# Patient Record
Sex: Female | Born: 1945 | Race: White | Hispanic: No | Marital: Married | Smoking: Current every day smoker
Health system: Southern US, Community
[De-identification: ages and names within clinical notes are randomized; demographics above are authoritative.]

## PROBLEM LIST (undated history)

## (undated) DIAGNOSIS — I10 Essential (primary) hypertension: Secondary | ICD-10-CM

## (undated) DIAGNOSIS — F32A Depression, unspecified: Secondary | ICD-10-CM

## (undated) DIAGNOSIS — E039 Hypothyroidism, unspecified: Secondary | ICD-10-CM

## (undated) DIAGNOSIS — J449 Chronic obstructive pulmonary disease, unspecified: Secondary | ICD-10-CM

## (undated) DIAGNOSIS — M1990 Unspecified osteoarthritis, unspecified site: Secondary | ICD-10-CM

## (undated) DIAGNOSIS — M8580 Other specified disorders of bone density and structure, unspecified site: Secondary | ICD-10-CM

## (undated) DIAGNOSIS — F329 Major depressive disorder, single episode, unspecified: Secondary | ICD-10-CM

## (undated) DIAGNOSIS — T4145XA Adverse effect of unspecified anesthetic, initial encounter: Secondary | ICD-10-CM

## (undated) DIAGNOSIS — Z8719 Personal history of other diseases of the digestive system: Secondary | ICD-10-CM

## (undated) DIAGNOSIS — T8859XA Other complications of anesthesia, initial encounter: Secondary | ICD-10-CM

## (undated) DIAGNOSIS — T7840XA Allergy, unspecified, initial encounter: Secondary | ICD-10-CM

## (undated) DIAGNOSIS — K219 Gastro-esophageal reflux disease without esophagitis: Secondary | ICD-10-CM

## (undated) DIAGNOSIS — F419 Anxiety disorder, unspecified: Secondary | ICD-10-CM

## (undated) DIAGNOSIS — G43909 Migraine, unspecified, not intractable, without status migrainosus: Secondary | ICD-10-CM

## (undated) DIAGNOSIS — I4891 Unspecified atrial fibrillation: Secondary | ICD-10-CM

## (undated) HISTORY — DX: Essential (primary) hypertension: I10

## (undated) HISTORY — PX: CERVICAL FUSION: SHX112

## (undated) HISTORY — DX: Unspecified atrial fibrillation: I48.91

## (undated) HISTORY — DX: Other specified disorders of bone density and structure, unspecified site: M85.80

## (undated) HISTORY — PX: TONSILLECTOMY: SUR1361

## (undated) HISTORY — DX: Migraine, unspecified, not intractable, without status migrainosus: G43.909

## (undated) HISTORY — PX: CHOLECYSTECTOMY: SHX55

## (undated) HISTORY — PX: THYROIDECTOMY: SHX17

## (undated) HISTORY — DX: Anxiety disorder, unspecified: F41.9

## (undated) HISTORY — PX: VAGINAL HYSTERECTOMY: SUR661

## (undated) HISTORY — PX: OTHER SURGICAL HISTORY: SHX169

## (undated) HISTORY — DX: Major depressive disorder, single episode, unspecified: F32.9

## (undated) HISTORY — PX: CATARACT EXTRACTION, BILATERAL: SHX1313

## (undated) HISTORY — DX: Allergy, unspecified, initial encounter: T78.40XA

## (undated) HISTORY — DX: Depression, unspecified: F32.A

## (undated) HISTORY — DX: Chronic obstructive pulmonary disease, unspecified: J44.9

---

## 1977-03-01 HISTORY — PX: BREAST EXCISIONAL BIOPSY: SUR124

## 1998-02-11 ENCOUNTER — Other Ambulatory Visit: Admission: RE | Admit: 1998-02-11 | Discharge: 1998-02-11 | Payer: Self-pay | Admitting: Family Medicine

## 1999-02-10 ENCOUNTER — Other Ambulatory Visit: Admission: RE | Admit: 1999-02-10 | Discharge: 1999-02-10 | Payer: Self-pay | Admitting: Family Medicine

## 1999-07-16 ENCOUNTER — Encounter: Admission: RE | Admit: 1999-07-16 | Discharge: 1999-07-16 | Payer: Self-pay | Admitting: Family Medicine

## 1999-07-16 ENCOUNTER — Encounter: Payer: Self-pay | Admitting: Family Medicine

## 1999-09-14 ENCOUNTER — Encounter: Admission: RE | Admit: 1999-09-14 | Discharge: 1999-09-14 | Payer: Self-pay | Admitting: Obstetrics and Gynecology

## 1999-09-14 ENCOUNTER — Encounter: Payer: Self-pay | Admitting: Obstetrics and Gynecology

## 1999-09-27 ENCOUNTER — Emergency Department (HOSPITAL_COMMUNITY): Admission: EM | Admit: 1999-09-27 | Discharge: 1999-09-28 | Payer: Self-pay | Admitting: Emergency Medicine

## 1999-09-27 ENCOUNTER — Encounter: Payer: Self-pay | Admitting: Emergency Medicine

## 1999-10-02 ENCOUNTER — Encounter: Payer: Self-pay | Admitting: Family Medicine

## 1999-10-02 ENCOUNTER — Encounter: Payer: Self-pay | Admitting: Emergency Medicine

## 1999-10-02 ENCOUNTER — Inpatient Hospital Stay (HOSPITAL_COMMUNITY): Admission: EM | Admit: 1999-10-02 | Discharge: 1999-10-06 | Payer: Self-pay | Admitting: Emergency Medicine

## 1999-11-17 ENCOUNTER — Encounter: Admission: RE | Admit: 1999-11-17 | Discharge: 1999-11-17 | Payer: Self-pay | Admitting: Internal Medicine

## 1999-11-17 ENCOUNTER — Encounter: Admission: RE | Admit: 1999-11-17 | Discharge: 1999-11-17 | Payer: Self-pay | Admitting: Family Medicine

## 1999-11-17 ENCOUNTER — Encounter: Payer: Self-pay | Admitting: Family Medicine

## 2000-02-12 ENCOUNTER — Other Ambulatory Visit: Admission: RE | Admit: 2000-02-12 | Discharge: 2000-02-12 | Payer: Self-pay | Admitting: Family Medicine

## 2000-03-14 ENCOUNTER — Ambulatory Visit (HOSPITAL_COMMUNITY): Admission: RE | Admit: 2000-03-14 | Discharge: 2000-03-14 | Payer: Self-pay | Admitting: Family Medicine

## 2000-03-31 ENCOUNTER — Encounter: Admission: RE | Admit: 2000-03-31 | Discharge: 2000-03-31 | Payer: Self-pay | Admitting: Orthopedic Surgery

## 2000-03-31 ENCOUNTER — Encounter: Payer: Self-pay | Admitting: Orthopedic Surgery

## 2000-09-23 ENCOUNTER — Encounter: Payer: Self-pay | Admitting: Family Medicine

## 2000-09-23 ENCOUNTER — Encounter: Admission: RE | Admit: 2000-09-23 | Discharge: 2000-09-23 | Payer: Self-pay | Admitting: Family Medicine

## 2001-02-14 ENCOUNTER — Other Ambulatory Visit: Admission: RE | Admit: 2001-02-14 | Discharge: 2001-02-14 | Payer: Self-pay | Admitting: Family Medicine

## 2001-09-27 ENCOUNTER — Encounter: Payer: Self-pay | Admitting: Family Medicine

## 2001-09-27 ENCOUNTER — Encounter: Admission: RE | Admit: 2001-09-27 | Discharge: 2001-09-27 | Payer: Self-pay | Admitting: Family Medicine

## 2002-02-15 ENCOUNTER — Other Ambulatory Visit: Admission: RE | Admit: 2002-02-15 | Discharge: 2002-02-15 | Payer: Self-pay | Admitting: Family Medicine

## 2002-10-01 ENCOUNTER — Encounter: Admission: RE | Admit: 2002-10-01 | Discharge: 2002-10-01 | Payer: Self-pay | Admitting: Family Medicine

## 2002-10-01 ENCOUNTER — Encounter: Payer: Self-pay | Admitting: Family Medicine

## 2003-02-18 ENCOUNTER — Other Ambulatory Visit: Admission: RE | Admit: 2003-02-18 | Discharge: 2003-02-18 | Payer: Self-pay | Admitting: Family Medicine

## 2003-02-19 ENCOUNTER — Encounter: Admission: RE | Admit: 2003-02-19 | Discharge: 2003-02-19 | Payer: Self-pay | Admitting: Family Medicine

## 2003-10-09 ENCOUNTER — Ambulatory Visit (HOSPITAL_COMMUNITY): Admission: RE | Admit: 2003-10-09 | Discharge: 2003-10-09 | Payer: Self-pay | Admitting: Family Medicine

## 2003-10-14 ENCOUNTER — Encounter: Admission: RE | Admit: 2003-10-14 | Discharge: 2003-10-14 | Payer: Self-pay | Admitting: Family Medicine

## 2003-12-03 ENCOUNTER — Ambulatory Visit (HOSPITAL_COMMUNITY): Admission: RE | Admit: 2003-12-03 | Discharge: 2003-12-03 | Payer: Self-pay | Admitting: Gastroenterology

## 2003-12-03 LAB — HM COLONOSCOPY

## 2004-02-17 ENCOUNTER — Encounter: Payer: Self-pay | Admitting: Neurological Surgery

## 2004-02-17 ENCOUNTER — Observation Stay (HOSPITAL_COMMUNITY): Admission: RE | Admit: 2004-02-17 | Discharge: 2004-02-18 | Payer: Self-pay | Admitting: Neurological Surgery

## 2004-04-22 ENCOUNTER — Other Ambulatory Visit: Admission: RE | Admit: 2004-04-22 | Discharge: 2004-04-22 | Payer: Self-pay | Admitting: Family Medicine

## 2004-11-11 ENCOUNTER — Ambulatory Visit (HOSPITAL_COMMUNITY): Admission: RE | Admit: 2004-11-11 | Discharge: 2004-11-11 | Payer: Self-pay | Admitting: Family Medicine

## 2005-04-23 ENCOUNTER — Other Ambulatory Visit: Admission: RE | Admit: 2005-04-23 | Discharge: 2005-04-23 | Payer: Self-pay | Admitting: Family Medicine

## 2005-11-12 ENCOUNTER — Ambulatory Visit (HOSPITAL_COMMUNITY): Admission: RE | Admit: 2005-11-12 | Discharge: 2005-11-12 | Payer: Self-pay | Admitting: Obstetrics and Gynecology

## 2006-05-31 ENCOUNTER — Ambulatory Visit (HOSPITAL_BASED_OUTPATIENT_CLINIC_OR_DEPARTMENT_OTHER): Admission: RE | Admit: 2006-05-31 | Discharge: 2006-06-01 | Payer: Self-pay | Admitting: Orthopedic Surgery

## 2006-05-31 ENCOUNTER — Encounter (INDEPENDENT_AMBULATORY_CARE_PROVIDER_SITE_OTHER): Payer: Self-pay | Admitting: *Deleted

## 2006-11-15 ENCOUNTER — Ambulatory Visit (HOSPITAL_COMMUNITY): Admission: RE | Admit: 2006-11-15 | Discharge: 2006-11-15 | Payer: Self-pay | Admitting: Family Medicine

## 2007-11-16 ENCOUNTER — Ambulatory Visit (HOSPITAL_COMMUNITY): Admission: RE | Admit: 2007-11-16 | Discharge: 2007-11-16 | Payer: Self-pay | Admitting: Family Medicine

## 2008-02-27 ENCOUNTER — Encounter: Payer: Self-pay | Admitting: Obstetrics and Gynecology

## 2008-02-27 ENCOUNTER — Other Ambulatory Visit: Admission: RE | Admit: 2008-02-27 | Discharge: 2008-02-27 | Payer: Self-pay | Admitting: Obstetrics and Gynecology

## 2008-02-27 ENCOUNTER — Ambulatory Visit: Payer: Self-pay | Admitting: Obstetrics and Gynecology

## 2008-04-23 ENCOUNTER — Encounter: Admission: RE | Admit: 2008-04-23 | Discharge: 2008-04-23 | Payer: Self-pay | Admitting: Internal Medicine

## 2008-04-23 ENCOUNTER — Ambulatory Visit: Payer: Self-pay | Admitting: Internal Medicine

## 2008-05-13 ENCOUNTER — Ambulatory Visit: Payer: Self-pay | Admitting: Internal Medicine

## 2008-07-15 ENCOUNTER — Ambulatory Visit: Payer: Self-pay | Admitting: Internal Medicine

## 2008-08-16 ENCOUNTER — Ambulatory Visit: Payer: Self-pay | Admitting: Internal Medicine

## 2008-11-21 ENCOUNTER — Ambulatory Visit: Payer: Self-pay | Admitting: Internal Medicine

## 2008-12-16 ENCOUNTER — Ambulatory Visit (HOSPITAL_COMMUNITY): Admission: RE | Admit: 2008-12-16 | Discharge: 2008-12-16 | Payer: Self-pay | Admitting: Obstetrics and Gynecology

## 2009-05-02 ENCOUNTER — Ambulatory Visit: Payer: Self-pay | Admitting: Obstetrics and Gynecology

## 2009-05-02 ENCOUNTER — Other Ambulatory Visit: Admission: RE | Admit: 2009-05-02 | Discharge: 2009-05-02 | Payer: Self-pay | Admitting: Obstetrics and Gynecology

## 2009-06-13 ENCOUNTER — Ambulatory Visit: Payer: Self-pay | Admitting: Internal Medicine

## 2009-08-18 ENCOUNTER — Ambulatory Visit: Payer: Self-pay | Admitting: Internal Medicine

## 2009-12-16 ENCOUNTER — Ambulatory Visit: Payer: Self-pay | Admitting: Internal Medicine

## 2009-12-17 ENCOUNTER — Ambulatory Visit (HOSPITAL_COMMUNITY): Admission: RE | Admit: 2009-12-17 | Discharge: 2009-12-17 | Payer: Self-pay | Admitting: Obstetrics and Gynecology

## 2010-02-24 ENCOUNTER — Ambulatory Visit
Admission: RE | Admit: 2010-02-24 | Discharge: 2010-02-24 | Payer: Self-pay | Source: Home / Self Care | Attending: Internal Medicine | Admitting: Internal Medicine

## 2010-03-20 ENCOUNTER — Ambulatory Visit
Admission: RE | Admit: 2010-03-20 | Discharge: 2010-03-20 | Payer: Self-pay | Source: Home / Self Care | Attending: Internal Medicine | Admitting: Internal Medicine

## 2010-03-22 ENCOUNTER — Encounter: Payer: Self-pay | Admitting: Family Medicine

## 2010-05-07 ENCOUNTER — Other Ambulatory Visit: Payer: Self-pay | Admitting: Obstetrics and Gynecology

## 2010-05-07 ENCOUNTER — Encounter (INDEPENDENT_AMBULATORY_CARE_PROVIDER_SITE_OTHER): Payer: PRIVATE HEALTH INSURANCE | Admitting: Obstetrics and Gynecology

## 2010-05-07 ENCOUNTER — Other Ambulatory Visit (HOSPITAL_COMMUNITY)
Admission: RE | Admit: 2010-05-07 | Discharge: 2010-05-07 | Disposition: A | Discharge status: Home / Self Care | Payer: PRIVATE HEALTH INSURANCE | Source: Ambulatory Visit | Attending: Obstetrics and Gynecology | Admitting: Obstetrics and Gynecology

## 2010-05-07 DIAGNOSIS — Z124 Encounter for screening for malignant neoplasm of cervix: Secondary | ICD-10-CM | POA: Insufficient documentation

## 2010-05-07 DIAGNOSIS — Z01419 Encounter for gynecological examination (general) (routine) without abnormal findings: Secondary | ICD-10-CM

## 2010-05-19 ENCOUNTER — Ambulatory Visit (INDEPENDENT_AMBULATORY_CARE_PROVIDER_SITE_OTHER): Payer: PRIVATE HEALTH INSURANCE | Admitting: Internal Medicine

## 2010-05-19 DIAGNOSIS — J209 Acute bronchitis, unspecified: Secondary | ICD-10-CM

## 2010-06-18 ENCOUNTER — Encounter (INDEPENDENT_AMBULATORY_CARE_PROVIDER_SITE_OTHER): Payer: PRIVATE HEALTH INSURANCE | Admitting: Internal Medicine

## 2010-06-18 DIAGNOSIS — I10 Essential (primary) hypertension: Secondary | ICD-10-CM

## 2010-06-18 DIAGNOSIS — E039 Hypothyroidism, unspecified: Secondary | ICD-10-CM

## 2010-06-18 DIAGNOSIS — E559 Vitamin D deficiency, unspecified: Secondary | ICD-10-CM

## 2010-06-18 DIAGNOSIS — F411 Generalized anxiety disorder: Secondary | ICD-10-CM

## 2010-07-17 NOTE — Op Note (Signed)
NAME:  Megan Williams, Megan Williams                 ACCOUNT NO.:  [id number]   MEDICAL RECORD NO.:  [id number]          PATIENT TYPE:  AMB   LOCATION:  ENDO                         FACILITY:  [facility]   PHYSICIAN:  Petra Kuba, M.D.    DATE OF BIRTH:  [birth year]   DATE OF PROCEDURE:  DATE OF DISCHARGE:                                 OPERATIVE REPORT   PROCEDURE:  Colonoscopy.   INDICATION:  The patient with a change in bowel habits, due for colonic  screening.  Consent was signed after risks, benefits, methods, options  thoroughly discussed in the office.   MEDICATIONS USED:  Demerol 80, Versed 8.   PROCEDURE:  Rectal inspection was pertinent for external hemorrhoids.  Small  digital exam was negative.  Video pediatric adjustable colonoscope was  inserted and fairly easily advanced around the colon to the cecum.  This did  require some abdominal pressure but no position changes.  The cecum was  identified by the appendiceal orifice and the ileocecal valve.  The scope  was inserted shortways in the terminal ileum which was normal.  No  abnormality was seen on insertion.  The scope was slowly withdrawn.  Prep  was adequate.  There was some liquid stool that required washing and  suctioning and so withdrawn through the colon.  No abnormalities were seen.  Specifically, no polyps, tumors, masses, or diverticula.  Once back in the  rectum, anal/rectal pull-through and retroflexion confirmed tiny to small  hemorrhoids.  The scope was reinserted shortways at the left side of the  colon.  Air was suctioned and scope was removed.  The patient tolerated the  procedure well.  There was no evidence of immediate complication.   ENDOSCOPIC DIAGNOSES:  1.  Internal/external hemorrhoids.  2.  Otherwise within normal limits to the terminal ileum.   PLAN:  Have the rectals guaiaced per primary care, Dr. Andrey Campanile.  See back  p.r.n.  Otherwise repeat screening in 5 years.      MEM/MEDQ  D:  12/03/2003  T:   12/03/2003  Job:  [id number]   cc:   Vale Haven. Andrey Campanile, M.D.  [address]  [place]  [place] [address]  Fax: [phone]

## 2010-07-17 NOTE — Op Note (Signed)
NAMEGISELE, PACK                 ACCOUNT NO.:  [id number]   MEDICAL RECORD NO.:  [id number]          PATIENT TYPE:  INP   LOCATION:  3012                         FACILITY:  [facility]   PHYSICIAN:  Stefani Dama, M.D.  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   POSTOPERATIVE DIAGNOSIS:  Cervical spondylosis plus herniated nucleus  pulposus with cervical radiculopathy, cervical myelopathy.   PROCEDURE:  Anterior cervical decompression, C4-5, C5-6, C6-7, arthrodesis  with structural allograft, and Alphatec plate fixation.   SURGEON:  Stefani Dama, M.D.   FIRST ASSISTANT:  Hilda Lias, M.D.   ANESTHESIA:  General endotracheal.   INDICATIONS:  The patient is a 65 year old individual who has had  significant neck, shoulder, and arm pain particularly on the right side.  She has evidence of severe spondylitic disease with compression of the left  side of the spinal cord at C4-5 and C5-6.  She has a herniated nucleus  pulposus with spondylitic changes at C6-7 on the right-hand side  corresponding to the area of most of her symptoms.  She has been advised  regarding surgical decompression and stabilization via an anterior  procedure.   PROCEDURE:  The patient was brought to the operating room supine on the  stretcher.  After a smooth induction of general endotracheal anesthesia, she  was placed in five pounds of Holter traction.  The neck was shaved, prepped  with DuraPrep, and draped in a sterile fashion.  A transverse incision was  made in the left side of the neck, and this was carried down through the  platysma.  The plane between the sternocleidomastoid and strap muscles was  dissected bluntly until the prevertebral space was reached.  The first  identifiable  disk space was noted to be that of C5-6.  Prevertebral space  was then dissected cephalad to expose C4-5 and inferiorly to expose C6-7.  Self-retaining Caspar retractor was placed in the wound and then a  diskectomy at C5-6 was undertaken.  The disk space was noted to be severely  collapsed with very little desiccated disk material within the disk space.  Opening the ventral aspect required the use of a high-speed drill and 2.3 mm  dissecting tool.  As the disk space was entered and dissected open, the self-  retaining spreader was placed on one side and this allowed for decompression  of the opposite side.  A large inferior osteophyte from the inferior margin  of the body at C5 was encountered, and this was drilled down with a high-  speed bur.  Uncinate process spurs were encountered on the right side and on  the left side, and these were drilled off and removed with the bone being  saved for use as bone graft.  The dissection was carried out to the right  side all the way, and the common dural tube and the takeoff of the C6 nerve  root was exposed and decompressed.  Hemostasis in the epidural veins in this  region was obtained with bipolar cautery and some small pledges of Gelfoam  soaked in thrombin.  Similar decompression was performed on the left side.  After this, the disk space was maintained open with a couple of cottonoid  patties stuffed into it and the diskectomy was performed at C6-7 and at C4-  5.  At C6-7 the disk was noted to be modestly degenerated, and there was a  fragment of disk over the right C7 nerve root in the foramen.  This was  removed and allowed for good decompression of the right-sided C7 nerve root.  Once this was accomplished, hemostasis was achieved with some pledgets of  Gelfoam soaked in thrombin, which were later irrigated away.  At C4-5,  spondylitic ridging on the right side of the spinal canal was encountered  from the inferior margin of C4 and the  superior margin of the body of C5.  This was drilled down with a high-speed drill.  At C6-7 then a 7 mm Trans-  Graft had the end plates shaved, and this was packed with demineralized bone  matrix and placed into the interspace.  At C5-6 a 7 mm Trans-Graft was  similarly shaved down.  This was filled with the patient's own bone that was  harvested from the uncinate process spurs and also mixed with Trans-Graft  and was placed into the interspace and counter sunk until flush.  The C4-5  space had a similar procedure using a Trans-Graft with the end plates shaved  and the bone graft shaved to the final configuration of the interspace.  Then ultimately a 51 mm standard-size Alphatec plate was fitted to the  ventral aspects of the vertebral bodies with fixed-angle locking 14 x 4 mm  screws in C5, C6, and C7, and variable-angle screws in C4.  Traction was  removed.  The wound was checked for hemostasis in the soft tissues with  bipolar cautery and some pledgets of Gelfoam soaked in thrombin which were  removed later, and once adequate hemostasis in all the soft tissues was  obtained, platysma was closed with 3-0 Vicryl in interrupted fashion and 3-0  Vicryl was used in the subcuticular tissues.  Dermabond was placed on the  skin.  The patient tolerated the procedure well and was returned to the  recovery room in stable condition.      Henr   HJE/MEDQ  D:  02/17/2004  T:  02/18/2004  Job:  [id number]

## 2010-07-17 NOTE — Discharge Summary (Signed)
[place]  Patient:    Megan Williams, Megan Williams                        MRN: [id number] Adm. Date:  [id number] Disc. Date: [id number] Attending:  Erich Montane CC:         Vale Haven. Andrey Campanile, M.D.  Dewayne Shorter, M.D.  Rockey Situ. Flavia Shipper., M.D.   Discharge Summary  DATE OF BIRTH:  2045-07-02  HISTORY OF PRESENT ILLNESS:  This was the first [facility] admission for this 65 year old, right-handed, white, married female from [place], [place], admitted from the emergency room to evaluate headaches and fever.  This patient has had a past history of migraine headaches, usually intermittent, described as throbbing in quality, and lasting as long as four days.  She has intermittently noted the onset of fever for as long as months, she thinks, but really only took her temperature the Friday of admission.  She had noted on Friday p.m., September 25, 1999, that she had the onset of headache and on September 27, 1999, noted fever with temperature.  She was seen at the [facility] Emergency Room where an abnormal chest x-ray was obtained, showing an infiltrate in the left lower lobe.  She was diagnosed as having a left lower lobe pneumonia and placed on Levaquin 500 mg q.d.  She, however, continued to have recurrent left-sided headaches and fever spikes in the 103-104 degree range.  There was no history of focal visual symptoms, slurred speech, weakness in one arm or one leg, blackout spells, tick exposure, seizures, etc.  She has no known history of mononucleosis.  She has not been around anyone who has been sick at this time or prior to her admission.  She had had no weight loss during these episodes.  PAST MEDICAL HISTORY:  Significant for a hysterectomy in 1979 and thyroid surgery in the past, currently on thyroid supplement.  MEDICATIONS:  Her medications on admission included Estrace 1 mg q.d., Levothroid 0.125 mg q.d., and  Levaquin 500 mg q.d.  Recently she had been on Phenergan, Imitrex, Tylenol, Advil, and Tussionex prior to admission.  ALLERGIES:  She has a known history of allergy to iodine, Keflex, and penicillin.  PHYSICAL EXAMINATION:  The examination at the time of admission was remarkable for a blood pressure in the right and left arm of 90/60 in the lying and sitting position with a heart rate of 60 and a temperature of 101.3 degrees with a stiff neck.  Her general examination was unremarkable.  I was unable to notice any enlargement of the liver, spleen, and kidneys specifically and there was no rash and no lymphadenopathy.  Her neurologic examination was normal, except for the stiff neck.  LABORATORY DATA:  The patient was prepped and draped in the left lateral decubitus position.  The opening pressure was 170 mm of water.  Clear then bloody CSF was obtained.  The laboratory studies on the CSF showed a white count of 2 and a red blood cell count of 278 in the first tube.  The CSF protein was 56 with a glucose of 62.  Her bacterial antigen studies were negative for Haemophilus influenzae, Streptococcal pneumoniae, Neisseria meningitidis, and Streptococcus group B.  Blood studies for Mycoplasma are still pending at this time.  Her urinalysis was unremarkable.  Her hemoglobin was 14.4 with repeats of 12.2 and 10.9, hematocrit 30.8-32.3, a white blood cell count  of 7900 with repeat of 7500, and a platelet count of 199,000 to 168,000.  She initially had 60% polys, 30% lymphs, 2% monocytes, 3% eosinophils, 2% basophils, and 3% leukocytes.  Her initial sodium was 131, potassium 3.6, chloride 98, CO2 content 26, glucose 125, BUN 7, creatinine 0.8, total bilirubin 0.7, alkaline phosphatase 192, SGOT 72, albumin 3.3, and calcium 8.7.  The CK was elevated at 242.  A mono spot test was negative.  Repeat electrolytes on October 04, 1999, revealed a sodium of 138, potassium 3.8, chloride 105, CO2 content 29,  glucose 139, BUN 4, creatinine 0.7, and a calcium of 8.2.  HOSPITAL COURSE:  The patient was admitted with fever and chills.  She underwent spinal tap, which showed no definite evidence of meningitis, but would have been compatible with meningitis that was near the end of its course because of elevated CSF protein.  This, however, was not thought to represent a meningitis in view of her hospital course, which had consisted of high fevers in the 103-104 degree range.  She was seen in consultation by infectious disease, both Rockey Situ. Flavia Shipper., M.D., and Dewayne Shorter, M.D., who felt that her course was most compatible with a CMV virus.  Her Hemoccult was negative in the hospital despite some drop in hemoglobin as noted above.  Cultures to date have grown no growth.  Her daughter was seen recently Duffy Rhody C. Andrey Campanile, M.D., with a "mono" viral-like illness.  Pending studies at the time of discharge include acute IgG and IgM antibodies, serologies for CMV, and EBV viruses and a separate CV IgM and CMV IgG serologies.  In the hospital, she was able to tolerate the pain very well with Percocet tablets.  Initially she required IV morphine, but by October 05, 1999, was able to tolerate Percocet as a treatment for headaches.  DISCHARGE DIAGNOSES: 1. Headaches.  784.0 2. Toxic cause for headaches with elevated liver function tests.  783.9 3. 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 4. History of hypothyroidism.  244.9  DISCHARGE MEDICATIONS: 1. Percocet one q.4-6h. p.r.n. pain. 2. Synthroid 0.125 mg q.d. 3. Pepcid 20 mg b.i.d. 4. Valium 5 mg b.i.d. x 3 days and then 5 mg q.h.s. 5. Estrace 1 mg q.d.  ACTIVITY:  She is not to drive a car.  FOLLOW-UP:  She is to return to [place] C. Andrey Campanile, M.D., in one week.  A repeat CBC will be obtained. DD:  10/06/99 TD:  10/07/99 Job: [id number]

## 2010-07-17 NOTE — Procedures (Signed)
[place]  Patient:    Megan Williams, Megan Williams                        MRN: [id number] Proc. Date: 10/02/99 Adm. Date:  [id number] Attending:  Erich Montane                           Procedure Report  DATE OF BIRTH:  12/15/1945  PROCEDURE NOTE:  Patient was prepped and draped in the left lateral decubitus position and with soap and water and alcohol in view of her history of allergy to Betadine.  The L4-L5 interspace was entered.  Initially, nice clear spinal fluid with an opening pressure of 170 mmH20 was obtained, but the patient moved, it became bloody, there was decreased flow.  So the second and third tubes were very red and then slightly pink.  Only a total of about 8 cc of spinal fluid was removed.  Patient tolerated the procedure well. DD:  10/02/99 TD:  10/05/99 Job: [id number]

## 2010-07-17 NOTE — Consult Note (Signed)
[place]. [facility]  Patient:    Megan Williams, Megan Williams                          MRN: [id number] Proc. Date: 10/04/99 Attending:  Reuben Likes, M.D. CC:         Marlan Palau, M.D.  Genene Churn. Love, M.D.   Consultation Report  CHIEF COMPLAINT:  "Headache, fever and stiff neck."  HISTORY OF PRESENT ILLNESS:  The patient is a 65 year old female who has had a 10-day history of severe left-sided headache and neck pain, backache, stiff neck, temperature up to 103, dry cough, yellow nasal drainage, left ear pain, photophobia and anorexia.  The patient denies any sore throat, swollen glands, chest pain, abdominal pain, nausea or vomiting, diarrhea, urinary symptoms or skin rash.  There is no history of tick bite or foreign travel.  She was exposed to a 51-month-old grandson who has a low grade fever and upper respiratory tract infection symptoms.  The patient was seen in the emergency room on September 27, 1999 at which time a chest x-ray showed a left lower lobe pneumonia.  She was started on Levaquin but has had no improvement thus far.  PAST MEDICAL HISTORY: Surgery:  The patient had a tonsillectomy in 1963.  She had a subtotal thyroidectomy in 1967 followed by radioactive iodine treatment in 1979 for hyperthyroidism.  She had a hysterectomy in 1979 due to abnormal Pap smear results and a cholecystectomy in 1993.  OTHER ILLNESSES:  Since her radioactive iodine treatment she has been hypothyroid.  MEDICATIONS:  Levothroid 125 mcg once a day and Esterase 1 mg per day.  ALLERGIES:  Penicillin and cephalosporins both cause hives.  FAMILY HISTORY: The patient is adopted.  Both of her children are in good health and a 34-month-old grandson as previously noted has been sick with some upper respiratory tract infection symptoms.  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  She drinks socially.  She did smoke a pack of cigarettes a day up until the time of  her illness, but she states that she has been sick she has lost her taste for cigarettes and has not been smoking since then.  REVIEW OF SYSTEMS:  She denies any other systemic, skin, eyes, head, ears, nose and throat, respiratory, cardiovascular, GI or GU, musculoskeletal or neurological complaints.  PHYSICAL EXAMINATION:  VITAL SIGNS:  Pulse 105/64, pulse 93 and regular, respirations 18, temperature 103.4.  GENERAL:  The patient is alert and does not appear toxic and does not appear in any distress.  SKIN:  Clear.  No rash.  No jaundice.  EYES:  Pupils equal round and reactive to light.  Full extraocular movements. Fundi benign.  Sclerae nonicteric..  ENT:  Tympanic membranes normal.  Pharynx clear.  No intraoral lesions.  NECK:  Tender to touch, stiff, decreased range of motion, no adenopathy. Thyroid is nonpalpable.  LUNGS:  Clear to auscultation and percussion.  HEART:  Regular rhythm no murmur or rub.  ABDOMEN:  Soft, nontender, no hepatosplenomegaly or mass.  EXTREMITIES:  No edema pulses full.  NEUROLOGICAL:  Alert and oriented x3.  Speech is clear and appropriate.  No extremity weakness or tremor.  Deep tendon reflexes 2+ and symmetrical, Babinski is downgoing.  Cranial nerves intact.  LABORATORIES:  CBC showed hemoglobin of 10.9 and white count 7.500.  Chemistry shows a sodium of 131, alkaline phosphatase of 192 and SGOT of 72.  Her CPK was elevated.  Spinal fluid showed 278 red blood cells, 2 white blood cells, glucose 62, protein 56, and negative bacterial antigens.  Chest x-ray showed a possible right mid lung infiltrate.  A cranial CT was negative.  IMPRESSION: 1. Prolonged febrile illness with low normal white cell count,    negative CSF, lung infiltrates, headaches, myalgias and elevated    liver function tests most consistent with cytomegalovirus. 2. Severe headaches.  PLAN: 1. Agree with Dr. Elder Negus plan for CMV serology and discontinue     antibiotics. 2. Pain control already on MS and Valium.  We could try Dilaudid or    NSAIDs for further pain relief. DD:  10/04/99 TD:  10/05/99 Job: [id number]

## 2010-07-17 NOTE — H&P (Signed)
[place]  Patient:    Megan Williams, Megan Williams                        MRN: [id number] Adm. Date:  [id number] Attending:  Erich Montane                         History and Physical  PATIENT ADDRESS: [address] [place], [place] [address]  DATE OF BIRTH: Sep 19, 1945  CHIEF COMPLAINT: This is the first [facility] admission for this 65 year old right-handed married white female from [place], [place], admitted from the emergency room for evaluation of headaches and fever.  HISTORY OF PRESENT ILLNESS: Megan Williams has a history of migraine headaches in the past occurring intermittently and lasting as long as four days.  She was in her usual state of health until Friday evening, September 25, 1999, when she noted onset of headache primarily on the left side with throbbing quality, and on September 27, 1999 develop fever.  This was a very severe headache and she was seen at [facility] Emergency Room, with abnormal chest x-ray in the left lower lung field noted, raising the question of pneumonia.  She was diagnosed as having a left lower lobe pneumonia and placed on Levaquin 500 mg q.d.  She has, however, continued to have temperatures as high as 103 degrees associated with left-sided headache since that time despite Levaquin 500 mg q.d.  She has not had any shortness of breath, sputum production, or chest pain.  She denies any other neurologic symptoms such as single eye visual loss, double vision loss, swallowing problems, slurred speech, blackouts, etc.  PAST MEDICAL HISTORY:  1. Hysterectomy in 1979.  2. Thyroid surgery in the past, on thyroid supplement.  She has no history of other serious medical problems such as diabetes, heart disease, stroke, cancer, convulsions, unconsciousness, or venereal disease.  MEDICATIONS:  1. Estrace 1 mg q.d.  2. Levothyroid 0.125 mg q.d.  3. Levaquin 500 mg q.d.  4. Recently she has also received  Demerol, Phenergan, Imitrex, Tylenol,     Advil, and Tussionex.  SOCIAL HISTORY: She finished paralegal school.  She smokes one pack per day of cigarettes, which she has done for 30 years.  She drinks three or four drinks per week of alcohol.  She has two children, a son age 71 and a daughter age 46, alive and well.  ALLERGIES:  1. IODINE.  2. KEFLEX.  3. PENICILLIN.  FAMILY HISTORY: She is adopted.  PHYSICAL EXAMINATION:  GENERAL: Well-developed, pleasant white female in no acute distress.  VITAL SIGNS: Weight not obtained.  Blood pressure lying in the right and left arm is 90/60.  Heart rate 60 and regular.  Temperature 101.4 degrees. Respiratory rate 20.  NECK: Flexion maneuvers reveal a stiff neck, with low back pain with neck flexion.  She had a negative Kernig sign.  HEENT: Tympanic membrane clear.  She is status post thyroid surgery.  LUNGS: Clear to auscultation.  HEART: No murmur.  ABDOMEN: Bowel sounds normal.  No enlargement of liver, spleen, or kidneys.  EXTREMITIES: No clubbing, cyanosis, or edema.  NEUROLOGIC: She was alert and oriented x 3 and followed one, two, and three step commands.  Cranial nerve examination revealed visual fields were full. Discs flat, with spontaneous venous pulsations seen.  Extraocular movements were full.  Corneals present.  Facial sensation equal, no facial motor  asymmetry.  Hearing present, with air conduction greater than bone conduction. Tongue was midline and uvula was midline.  Gags were present. Sternocleidomastoid and trapezius testing were normal.  Motor examination revealed 5/5 strength proximally and distally in the upper extremities without any evidence of drift.  Coordination testing revealed finger-to-nose, heel-to-shin, and rapid alternating movements to be normal.  Sensory examination was intact to pinprick, position, and vibration testing.  Deep tendon reflexes were 2+.  Plantar responses were  downgoing.  IMPRESSION:  1. Headache, code 784.0.  2. Suspect viral meningitis, code 047.9.  3. History of migraine, code 346.10.  4. Hypothyroidism, code 344.9.  PLAN: The plan at this time is to admit the patient and have a spinal tap performed.  Admission will dependent on results of the spinal tap. DD:  10/02/99 TD:  10/03/99 Job: [id number]

## 2010-07-17 NOTE — Discharge Summary (Signed)
[place]. [facility]  Patient:    Megan Williams, Megan Williams                        MRN: [id number] Adm. Date:  [id number] Disc. Date: [id number] Attending:  Erich Montane                           Discharge Summary  DATE OF BIRTH:  1945-11-28  ADDENDUM:  CT scan of the brain was obtained the night of admission which was unremarkable.  CBC the day of discharge revealed a hemoglobin of 10.9, hematocrit 31.9, white blood cell count 7000, platelets 201,000 which was stable. DD:  10/06/99 TD:  10/07/99 Job: [id number]

## 2010-07-17 NOTE — Op Note (Signed)
NAME:  Megan Williams, Megan Williams                 ACCOUNT NO.:  [id number]   MEDICAL RECORD NO.:  [id number]          PATIENT TYPE:  AMB   LOCATION:  DSC                          FACILITY:  [facility]   PHYSICIAN:  Katy Fitch. Sypher, M.D. 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   OPERATION:  1. Resection of right trapezium with synovectomy of CMC joint with      removal loose bodies and cartilaginous debris.  2. One-third diameter distally based flexor carpi radialis      intermetacarpal ligament reconstruction between index and thumb      metacarpals.  3. Tight rope suspensionplasty.   OPERATING SURGEON:  Josephine Igo, M.D.   ASSISTANT:  Annye Rusk, PA-C.   ANESTHESIA:  General by LMA.   SUPERVISING ANESTHESIOLOGIST:  Germaine Pomfret, M.D.   INDICATIONS:  Megan Williams is a 65 year old woman referred through the  courtesy of Dr. Dewaine Conger for evaluation and management of painful  thumb CMC arthrosis.  Clinical examination revealed Eaton stage III CMC  arthrosis.  X-rays revealed bone-on-bone arthropathy with multiple loose  bodies.  Due to a failure to respond to nonoperative measures Megan Williams  is brought to the operating at this time anticipating [facility] reconstruction  utilizing autogenous tendon graft for intermetacarpal ligament  reconstruction and a tight rope construct in lieu of Kirschner wire  fixation.   Preoperatively, she had a detailed informed consent during which she  discussed the potential complications of regional pain or dystrophy,  failure  to relieve all of her pain, infection and failure of the devices  used in surgery.   After questions were invited and answered, she is brought to the  operating room at this time.   PROCEDURE:  Megan Williams is brought to the operating room and placed in  supine position upon the operating table.  Preoperatively, her allergies  including penicillin, cephalosporins and latex were confirmed with the  operating room staff and anesthesia staff.   After an anesthesia consultation, she declined a regional block and  general anesthesia was recommended.   She was transferred to room #6 placed in the supine position upon the  operating table and under Dr. Edison Pace supervision, general  endotracheal anesthesia induced.   The right arm was prepped with Betadine soap solution and sterilely  draped.  A pneumatic tourniquet was applied to the proximal right  brachium.   Following exsanguination of right arm with Esmarch bandage, the arterial  tourniquet was inflated to 240 mmHg.  The procedure commenced with a  Wagner type thenar incision.  Subcutaneous tissues were carefully  divided, meticulously identifying the branches of the superficial radial  sensory cutaneous nerves and  lateral antebrachial cutaneous sensory  nerves.   The thenar muscles were elevated with the palmar slip of the abductor  pollicis longus attached to the thenar muscles.  The [facility] joint was  encountered and a subperiosteal exposure of the trapezium accomplished.  The trapezium was removed piecemeal with rongeurs and subsequently a  synovectomy of the [facility] joint accomplished.   The flexor carpi radialis was carefully preserved.   The palmaris longus was harvested through a short transverse incision at  the wrist flexion crease.  Unfortunately, the palmaris longus was so  diminutive, i.e., less than 1.5 mm in diameter in mid forearm, it was  not acceptable as a tendon graft.  Therefore, one third of the flexor  carpi  radialis was harvested with a small forearm incision brought  distally by tendon splitting technique passed into the cavity created by  trapezium excision and split to its insertion at the base of the index  metacarpal.   Drill holes were created through the base of the thumb metacarpal and  index metacarpal to accept a tight rope utilizing a cannulated drill bit  provided by [employer].   The tight rope device was prepared in the usual manner.   The tight rope device was passed through the drill holes with the oblong  anchor secured against the index metacarpal.  The flexor carpi radialis  tendon graft was drawn into the base of the index metacarpal with loop  technique utilizing a 2-0 FiberWire suture.  The tendon graft was then  drawn into the thumb metacarpal and tensioned appropriately to create a  suspension intermetacarpal ligament.  The free end of the tendon was  then wrapped 720 degrees around the tendon graft creating an excellent  interposition graft between the thumb metacarpal and the index  metacarpal.   The tendon graft was then sutured with a mattress suture of 3-0 Ethibond  followed by tensioning the tight rope device.   A very satisfactory suspension was achieved.   The wound was then thoroughly lavaged with sterile saline followed by  careful repair of the thenar muscles over the tight rope button and  repair of the wounds with subdermal suture of 3-0 Vicryl and intradermal  3-0 Prolene.   Care was taken to reinforce the insertion of the abductor pollicis  longus to the periosteum of thumb metacarpal with a core suture of 3-0  Ethibond.   There were no apparent complications noted.   Megan Williams tolerated surgery and anesthesia well.   She was wakened from anesthesia and transferred to the recovery room  with stable vital signs.   She will be admitted to the recovery care center for observation of vital signs and appropriate analgesics in the form of IV PCA  morphine  and p.o. and IV Dilaudid.  She will be try to 1 gram of vancomycin as an  IV prophylactic antibiotic in approximately 24 hours.      Katy Fitch Sypher, M.D.  Electronically Signed     RVS/MEDQ  D:  05/31/2006  T:  05/31/2006  Job:  [id number]   cc:   Thereasa Distance A. Chaney Malling, M.D.

## 2010-09-11 ENCOUNTER — Encounter: Payer: Self-pay | Admitting: Internal Medicine

## 2010-09-15 ENCOUNTER — Other Ambulatory Visit: Payer: PRIVATE HEALTH INSURANCE | Admitting: Internal Medicine

## 2010-09-15 DIAGNOSIS — E789 Disorder of lipoprotein metabolism, unspecified: Secondary | ICD-10-CM

## 2010-09-15 DIAGNOSIS — E039 Hypothyroidism, unspecified: Secondary | ICD-10-CM

## 2010-09-15 LAB — LIPID PANEL
Cholesterol: 193 mg/dL (ref 0–200)
HDL: 58 mg/dL (ref >39)
LDL Cholesterol: 97 mg/dL (ref 0–99)
Total CHOL/HDL Ratio: 3.3 Ratio
Triglycerides: 190 mg/dL — ABNORMAL HIGH (ref <150)
VLDL: 38 mg/dL (ref 0–40)

## 2010-09-15 LAB — TSH: TSH: 0.368 u[IU]/mL (ref 0.350–4.500)

## 2010-09-17 ENCOUNTER — Ambulatory Visit (INDEPENDENT_AMBULATORY_CARE_PROVIDER_SITE_OTHER): Payer: PRIVATE HEALTH INSURANCE | Admitting: Internal Medicine

## 2010-09-17 ENCOUNTER — Encounter: Payer: Self-pay | Admitting: Internal Medicine

## 2010-09-17 DIAGNOSIS — E039 Hypothyroidism, unspecified: Secondary | ICD-10-CM

## 2010-09-17 DIAGNOSIS — G4730 Sleep apnea, unspecified: Secondary | ICD-10-CM

## 2010-09-17 DIAGNOSIS — F419 Anxiety disorder, unspecified: Secondary | ICD-10-CM

## 2010-09-17 DIAGNOSIS — Z87891 Personal history of nicotine dependence: Secondary | ICD-10-CM

## 2010-09-17 DIAGNOSIS — R4789 Other speech disturbances: Secondary | ICD-10-CM

## 2010-09-17 DIAGNOSIS — R4702 Dysphasia: Secondary | ICD-10-CM

## 2010-09-17 DIAGNOSIS — F411 Generalized anxiety disorder: Secondary | ICD-10-CM

## 2010-09-17 DIAGNOSIS — E785 Hyperlipidemia, unspecified: Secondary | ICD-10-CM

## 2010-09-17 NOTE — Patient Instructions (Signed)
Had Wellbutrin XL 150 mg daily to Lexapro 10 mg daily. Continue with same dose of Synthroid. We will schedule sleep consultation for U. He will have a barium swallow to rule out any issues with swallowing or lesions in the esophagus.

## 2010-09-17 NOTE — Progress Notes (Signed)
  Subjective:    Patient ID: Megan Williams, female    DOB: 17-Mar-1945, 65 y.o.   MRN: [id number]  HPI patient in today to followup on hyperlipidemia and hypothyroidism. Instead of starting lipid-lowering medication she decided to go on a strict diet. Her cholesterol has improved considerably just with diet alone but triglycerides are elevated slightly compared to previous study April 2012. TSH is within normal limits on Synthroid 0.112 mg daily. This was refilled for 90 days today by written prescription. Patient says that she's had several episodes where she awakens and is gasping for breath. At one point she realized she had vomited. She is afraid that she will die in her sleep. She denies any trouble with odynophagia or dysphagia. Has been extremely tired recently. Wants to sleep a lot. History of anxiety. Worries about lots of things and is a perfectionistic personality. Probably takes on too many church activities. Has difficulty saying noted. Has been to counseling previously with Dr. Ollen Gross, psychologist. In April she quit smoking cold [place] for short period of time but now is back to one half pack cigarettes daily. She also has a history of hypertension treated with lisinopril. History of allergic rhinitis. History of vitamin D deficiency and herpes simplex. Had colonoscopy in 2005. She is adopted and has 2 adult children. 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 Has smoked for over 40 years. Does not consume alcohol.  Tonsillectomy 1963, thyroidectomy 1968, hysterectomy without oophorectomy 1979, cholecystectomy 1993, cervical spine 3 level fusion C4-to C 7 2005 by Dr. Danielle Dess. Joint reconstruction right thumb 2008 by Dr.Sypher. Has been on SSRI medication for a number of years. Took Prozac under the care of Dr. Karma Ganja and Cymbalta under the care of Dr. Clent Demark.    Review of Systems     Objective:   Physical Exam HEENT exam: TMs and pharynx are clear, neck is  supple, no thyromegaly, chest clear cardiac exam regular rate and rhythm, extremities without edema        Assessment & Plan:  Fatigue-could be related to anxiety/depression. Could be related to Lexapro causing a bit of fatigue. We are going to add Wellbutrin XL 150 mg daily. Hopefully that will also help with cravings for cigarettes. Continue with Lexapro 10 mg daily for anxiety depression.  ? Sleep apnea-consider sleep evaluation.  Nocturnal vomiting in her sleep-? Reflux. Only had one occasion of this so far but it was frightening to her. Plan is to do a barium swallow. Consider PPI medication.  Hypothyroidism-TSH stable continue with Synthroid 0.112 mg daily  Hyperlipidemia-continue diet control  Cigarette abuse-cannot tolerate Chantix due to nausea. Try Wellbutrin.  Hypertension-stable on the sun up real 10 mg daily  Has return appointment October 2012. Will get fasting lipid panel at that time

## 2010-09-18 ENCOUNTER — Encounter: Payer: Self-pay | Admitting: Internal Medicine

## 2010-09-18 ENCOUNTER — Ambulatory Visit
Admission: RE | Admit: 2010-09-18 | Discharge: 2010-09-18 | Disposition: A | Discharge status: Home / Self Care | Payer: PRIVATE HEALTH INSURANCE | Source: Ambulatory Visit | Attending: Internal Medicine | Admitting: Internal Medicine

## 2010-09-18 DIAGNOSIS — R4702 Dysphasia: Secondary | ICD-10-CM

## 2010-09-21 ENCOUNTER — Telehealth: Payer: Self-pay

## 2010-09-21 DIAGNOSIS — R1310 Dysphagia, unspecified: Secondary | ICD-10-CM

## 2010-09-21 MED ORDER — PANTOPRAZOLE SODIUM 40 MG PO TBEC: 40.0000 mg | DELAYED_RELEASE_TABLET | Freq: Every day | ORAL | Status: DC

## 2010-09-23 NOTE — Telephone Encounter (Signed)
protonix 40 mg rx refilled

## 2010-10-13 ENCOUNTER — Institutional Professional Consult (permissible substitution): Payer: PRIVATE HEALTH INSURANCE | Admitting: Pulmonary Disease

## 2010-11-13 ENCOUNTER — Telehealth: Payer: Self-pay | Admitting: Internal Medicine

## 2010-11-18 NOTE — Telephone Encounter (Signed)
Advised Dr. Lenord Fellers of pt's upcoming surgery on October 1st.

## 2010-11-19 ENCOUNTER — Other Ambulatory Visit (HOSPITAL_COMMUNITY): Payer: Self-pay | Admitting: Neurological Surgery

## 2010-11-19 ENCOUNTER — Encounter (HOSPITAL_COMMUNITY)
Admission: RE | Admit: 2010-11-19 | Discharge: 2010-11-19 | Disposition: A | Discharge status: Home / Self Care | Payer: PRIVATE HEALTH INSURANCE | Source: Ambulatory Visit | Attending: Neurological Surgery | Admitting: Neurological Surgery

## 2010-11-19 DIAGNOSIS — M5416 Radiculopathy, lumbar region: Secondary | ICD-10-CM

## 2010-11-19 DIAGNOSIS — M5126 Other intervertebral disc displacement, lumbar region: Secondary | ICD-10-CM

## 2010-11-19 DIAGNOSIS — M47816 Spondylosis without myelopathy or radiculopathy, lumbar region: Secondary | ICD-10-CM

## 2010-11-19 LAB — BASIC METABOLIC PANEL
BUN: 11 mg/dL (ref 6–23)
CO2: 30 mEq/L (ref 19–32)
Calcium: 9.8 mg/dL (ref 8.4–10.5)
Chloride: 99 mEq/L (ref 96–112)
Creatinine, Ser: 0.74 mg/dL (ref 0.50–1.10)
GFR calc Af Amer: >60 mL/min (ref >60)
GFR calc non Af Amer: >60 mL/min (ref >60)
Glucose, Bld: 98 mg/dL (ref 70–99)
Potassium: 4.3 mEq/L (ref 3.5–5.1)
Sodium: 136 mEq/L (ref 135–145)

## 2010-11-19 LAB — CBC
HCT: 38.6 % (ref 36.0–46.0)
Hemoglobin: 13.3 g/dL (ref 12.0–15.0)
MCH: 31.3 pg (ref 26.0–34.0)
MCHC: 34.5 g/dL (ref 30.0–36.0)
MCV: 90.8 fL (ref 78.0–100.0)
Platelets: 284 10*3/uL (ref 150–400)
RBC: 4.25 MIL/uL (ref 3.87–5.11)
RDW: 14.4 % (ref 11.5–15.5)
WBC: 11.9 10*3/uL — ABNORMAL HIGH (ref 4.0–10.5)

## 2010-11-19 LAB — ABO/RH: ABO/RH(D): O POS

## 2010-11-19 LAB — SURGICAL PCR SCREEN
MRSA, PCR: NEGATIVE
Staphylococcus aureus: NEGATIVE

## 2010-11-19 LAB — TYPE AND SCREEN
ABO/RH(D): O POS
Antibody Screen: NEGATIVE

## 2010-11-30 ENCOUNTER — Inpatient Hospital Stay (HOSPITAL_COMMUNITY): Payer: PRIVATE HEALTH INSURANCE

## 2010-11-30 ENCOUNTER — Inpatient Hospital Stay (HOSPITAL_COMMUNITY)
Admission: RE | Admit: 2010-11-30 | Discharge: 2010-12-03 | DRG: 460 | Disposition: A | Discharge status: Home / Self Care | Payer: PRIVATE HEALTH INSURANCE | Source: Ambulatory Visit | Attending: Neurological Surgery | Admitting: Neurological Surgery

## 2010-11-30 DIAGNOSIS — J449 Chronic obstructive pulmonary disease, unspecified: Secondary | ICD-10-CM | POA: Diagnosis present

## 2010-11-30 DIAGNOSIS — Z79899 Other long term (current) drug therapy: Secondary | ICD-10-CM

## 2010-11-30 DIAGNOSIS — E039 Hypothyroidism, unspecified: Secondary | ICD-10-CM | POA: Diagnosis present

## 2010-11-30 DIAGNOSIS — J4489 Other specified chronic obstructive pulmonary disease: Secondary | ICD-10-CM | POA: Diagnosis present

## 2010-11-30 DIAGNOSIS — F411 Generalized anxiety disorder: Secondary | ICD-10-CM | POA: Diagnosis present

## 2010-11-30 DIAGNOSIS — I10 Essential (primary) hypertension: Secondary | ICD-10-CM | POA: Diagnosis present

## 2010-11-30 DIAGNOSIS — I739 Peripheral vascular disease, unspecified: Secondary | ICD-10-CM | POA: Diagnosis present

## 2010-11-30 DIAGNOSIS — F17200 Nicotine dependence, unspecified, uncomplicated: Secondary | ICD-10-CM | POA: Diagnosis present

## 2010-11-30 DIAGNOSIS — Z01818 Encounter for other preprocedural examination: Secondary | ICD-10-CM

## 2010-11-30 DIAGNOSIS — Z01810 Encounter for preprocedural cardiovascular examination: Secondary | ICD-10-CM

## 2010-11-30 DIAGNOSIS — M47817 Spondylosis without myelopathy or radiculopathy, lumbosacral region: Principal | ICD-10-CM | POA: Diagnosis present

## 2010-11-30 DIAGNOSIS — K219 Gastro-esophageal reflux disease without esophagitis: Secondary | ICD-10-CM | POA: Diagnosis present

## 2010-11-30 DIAGNOSIS — F3289 Other specified depressive episodes: Secondary | ICD-10-CM | POA: Diagnosis present

## 2010-11-30 DIAGNOSIS — Z01812 Encounter for preprocedural laboratory examination: Secondary | ICD-10-CM

## 2010-11-30 DIAGNOSIS — F329 Major depressive disorder, single episode, unspecified: Secondary | ICD-10-CM | POA: Diagnosis present

## 2010-12-25 ENCOUNTER — Other Ambulatory Visit: Payer: Self-pay | Admitting: Obstetrics and Gynecology

## 2010-12-25 DIAGNOSIS — Z1231 Encounter for screening mammogram for malignant neoplasm of breast: Secondary | ICD-10-CM

## 2010-12-30 NOTE — Discharge Summary (Signed)
Williams, Megan                 ACCOUNT NO.:  [id number]  MEDICAL RECORD NO.:  [id number]  LOCATION:  3015                         FACILITY:  [facility]  PHYSICIAN:  Stefani Dama, M.D.  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  OPERATIONS AND PROCEDURES:  Bilateral laminotomies at L4-5, decompression and posterior spinal fusion using PEEK spacers and posterolateral arthrodesis at L4-5 with pedicle screw fixation.  BRIEF HISTORY AND HOSPITAL COURSE:  Megan Williams is a 65 year old female who had significant back pain and leg pain for the past 40 years.  She has been treated conservatively over the course of time and has had an increase in her pain, difficulty walking short distances, significant lateral leg pain, level of function has deteriorated in the last 6 months and due to the fact, she failed conservative care.  She was advised on surgical decompression and stabilization at L4-5 level.  The patient underwent posterior spinal fusion and decompression on November 30, 2010, tolerated the procedure well, stable and was taken to the recovery room, placed on a morphine PCA pump for pain control, started on physical therapy and occupational therapy postoperatively.  First day postop, the Foley catheter was discontinued.  She was able to void on her own, start eating well.  She was having some difficulty with pain control.  We discontinued her PCA, placed her on Percocet.  She had Valium for muscle relaxation.  She made slow progress with physical therapy.  She was ambulating safely and ready for discharge home on December 03, 2010, eating  well and voiding well.  Vital signs were stable and afebrile.  Wound benign, no erythema, drainage or signs of infection.  Comfortable on Valium and Percocet for pain control.  She is going to get the shower with assistance prior to discharge home, the IV will be discontinued prior to discharge home.  Continue on her home medications.  DISCHARGE MEDICATIONS: 1. Pseudoephedrine 30 mg p.o. daily p.r.n. 2. Benadryl 25 mg p.o. daily p.r.n. 3. Tramadol 50 mg one p.o. daily p.r.n. pain. 4. Valtrex 500 mg one p.o. b.i.d. p.r.n. 5. Percocet 5/325 one p.o. one to two p.o. q.4 h. p.r.n. pain. 6. She will have her Naprosyn on hold for the first month     postoperatively due to her fusion. 7. Multivitamin over-the-counter. 8. Vitamin D3 2000 units p.o. daily. 9. Xanax 0.5 mg one half to one p.o. b.i.d. p.r.n. anxiety. 10.Estrace 1 mg one p.o. q.a.m. 11.Synthroid 112 mcg one p.o. daily. 12.Lexapro 10 mg one p.o. daily. 13.Lisinopril 10 mg one p.o. daily. 14.Protonix 40 mg one p.o. daily.  DISCHARGE PRESCRIPTIONS:  Percocet and Valium 5 mg one p.o. q.12 h. p.r.n. muscle spasm.  She is to be discharge home.  Home durable medical equipment as needed. Work with physical therapy prior to discharge home.  Follow up in 2-3 weeks with Dr.  Manasvini Whatley.  All questions were encouraged, answered and addressed.     Megan Williams Medico.   ______________________________ Stefani Dama, M.D.    SCI/MEDQ  D:  12/03/2010  T:  12/03/2010  Job:  [id number]  Electronically Signed by Orlin Hilding P.A. on 12/08/2010 02:37:37 PM Electronically Signed by Barnett Abu M.D. on 12/30/2010 07:02:01 AM

## 2010-12-30 NOTE — Op Note (Signed)
NAMEKATELYNNE, Williams                 ACCOUNT NO.:  [id number]  MEDICAL RECORD NO.:  [id number]  LOCATION:  3015                         FACILITY:  [facility]  PHYSICIAN:  Stefani Dama, M.D.  DATE OF BIRTH:  1945/05/19  DATE OF PROCEDURE:  11/30/2010 DATE OF DISCHARGE:                              OPERATIVE REPORT   PREOPERATIVE DIAGNOSES: 1. Lumbar spondylosis with lumbar radiculopathy, L4-L5. 2. Lumbar stenosis. 3. Neurogenic claudication.  POSTOPERATIVE DIAGNOSES: 1. Lumbar spondylosis with lumbar radiculopathy, L4-L5. 2. Lumbar stenosis. 3. Neurogenic claudication.  PROCEDURES: 1. Bilateral laminotomies at L4-L5. 2. Decompression of L4 and L5 nerve roots with dissection greater than     that needed for simple interbody fusion. 3. Posterior lumbar interbody fusion using PEEK spacers, local     autograft, and allograft at L4-L5. 4. Nonsegmental fixation with pedicle screws using medial to lateral     trajectory from L4-L5. 5. Posterolateral arthrodesis with autograft and allograft, L4-L5.  SURGEON:  Stefani Dama, MD  FIRST ASSISTANT:  Hewitt Shorts, MD  ANESTHESIA:  General endotracheal.  INDICATIONS:  Megan Williams is a 65 year old individual who has had significant back pain and leg pain.  She has had significant degenerative changes at L4-L5 that have been followed and treated conservatively.  However, now, she has a significant lateral side pain and pain with walking even short distances.  Her level of function has deteriorated such that she was advised regarding surgical decompression and stabilization of the L4-L5 level.  PROCEDURE IN DETAIL:  The patient was brought to the operating room supine on the stretcher.  After the smooth induction of general endotracheal anesthesia, she was turned prone.  The back was prepped with alcohol and DuraPrep and draped in sterile fashion.  Midline incision was created and carried down to the lumbodorsal fascia at  L4-L5 and this area was identified positively on a radiograph.  Then by dissecting out to the facet joint at L4-L5, I chose pedicle entry sites on the inferior portion of the pars at L4 and similarly at L5.  Then, laminotomy was created removing the entirety of the medial facet of L4 and exposing the common dural tube and takeoff of the L4 nerve root superiorly and the L5 nerve root inferiorly.  These areas were then carefully decompressed using a series of 2-mm Kerrison punches and careful microdissection technique.  The common dural tube could be mobilized medially.  The disk space was noted to be severely sclerotic. It was opened with a #15 blade and a combination of curettes and rongeurs was used to enter the disk space, which was noted to be severely sclerotic.  Gradually by working within the disk space, we were able to free the disk space and allow for some distraction of the disk space up to approximately 6 mm.  A series of disk shavers were then used to decorticate the endplates from the left side and the right side.  The lateral gutters were then decompressed and the L4 nerve roots were well decompressed once the disk was more fully mobilized.  With the interspace being fully mobilized, hemostasis in the epidural space was carefully maintained and then with the lateral  recesses and the nerve roots being decompressed, the interspace was prepared for grafting and it was felt that a 7- mm transforaminal spacer would fit well into either lateral gutter.  This was packed with bone sponge with demineralized bone matrix and crystalline material and then placed into the interbody space at L4-L5.  This was first done on the right side and then on the left side.  Care was taken to make sure that the L4 and the L5 nerve roots were maintained at decompression.  Once this was accomplished, the spinous process area was prepared, the interspinous ligament was removed in total, and it was felt  that a medium-sized spinous process plate from the [employer] system would work well to hold the spinous processes and tap this.  It was then affixed to L4-L5 in a compression mode.  This was tightened and torqued to its final resting position.  The pedicle entry sites that had been previously marked were then screwed and tapped with 5.5 x 35-mm screws at L4 and L5.  This was only feasible on the right side as the plate itself was somewhat crowd on the left side.  The screws being placed on the side of the lateral gutter was then packed with bone.  The system was torqued down to final resting position.  The opposite lateral gutter was packed with bone and bone graft similarly.  Hemostasis in the soft tissues was obtained meticulously and lumbodorsal fascia was closed with #1 Vicryl in interrupted fashion, 2-0 Vicryl using subcutaneous tissues, and 3-0 Vicryl subcuticularly.  Blood loss for the procedure was estimated about 250 mL.     Stefani Dama, M.D.     Merla Riches  D:  11/30/2010  T:  12/01/2010  Job:  [id number]  Electronically Signed by Barnett Abu M.D. on 12/30/2010 07:02:06 AM

## 2011-01-13 ENCOUNTER — Other Ambulatory Visit (INDEPENDENT_AMBULATORY_CARE_PROVIDER_SITE_OTHER): Payer: PRIVATE HEALTH INSURANCE | Admitting: Internal Medicine

## 2011-01-13 DIAGNOSIS — E785 Hyperlipidemia, unspecified: Secondary | ICD-10-CM

## 2011-01-13 LAB — LIPID PANEL
Cholesterol: 202 mg/dL — ABNORMAL HIGH (ref 0–200)
HDL: 54 mg/dL (ref >39)
LDL Cholesterol: 117 mg/dL — ABNORMAL HIGH (ref 0–99)
Total CHOL/HDL Ratio: 3.7 Ratio
Triglycerides: 156 mg/dL — ABNORMAL HIGH (ref <150)
VLDL: 31 mg/dL (ref 0–40)

## 2011-01-14 ENCOUNTER — Other Ambulatory Visit: Payer: PRIVATE HEALTH INSURANCE | Admitting: Internal Medicine

## 2011-01-14 LAB — TSH: TSH: 0.057 u[IU]/mL — ABNORMAL LOW (ref 0.350–4.500)

## 2011-01-15 ENCOUNTER — Encounter: Payer: Self-pay | Admitting: Internal Medicine

## 2011-01-15 ENCOUNTER — Ambulatory Visit (INDEPENDENT_AMBULATORY_CARE_PROVIDER_SITE_OTHER): Payer: PRIVATE HEALTH INSURANCE | Admitting: Internal Medicine

## 2011-01-15 VITALS — BP 100/62 | HR 80 | Temp 98.2°F | Wt 137.5 lb

## 2011-01-15 DIAGNOSIS — E039 Hypothyroidism, unspecified: Secondary | ICD-10-CM

## 2011-01-15 DIAGNOSIS — G4700 Insomnia, unspecified: Secondary | ICD-10-CM

## 2011-01-15 DIAGNOSIS — F419 Anxiety disorder, unspecified: Secondary | ICD-10-CM

## 2011-01-15 DIAGNOSIS — F411 Generalized anxiety disorder: Secondary | ICD-10-CM

## 2011-01-15 DIAGNOSIS — E785 Hyperlipidemia, unspecified: Secondary | ICD-10-CM

## 2011-01-15 DIAGNOSIS — I10 Essential (primary) hypertension: Secondary | ICD-10-CM

## 2011-01-15 NOTE — Progress Notes (Signed)
  Subjective:    Patient ID: Megan Williams, female    DOB: [birth year], 65 y.o.   MRN: [id number]  HPI  65 year old white female with history of hypothyroidism and hyperlipidemia. In today for six-month recheck. In October she had an L4-L5 decompression with stabilization with plate and screws by Dr. Danielle Dess. She had to wear a back brace for some 6 weeks. Doesn't know if she got influenza immunization in the hospital overnight and she will need will need to call and find out as this information is not readily available in  E- chart or Access Anywhere. Her lipid panel has improved significantly as from 6 months ago when total cholesterol was 243 and LDL cholesterol was 145. At that time she was placed on Synthroid 0.112 mg daily an increase from 0.1 mg daily as her TSH was 5.285. Now it would appear that she is over replaced as her TSH is low. However, said she still recovering from surgery on going to leave it at the same dose for now and recheck in 6 months. She says she feels cold all the time. She does have night sweats which I think is related to menopause. She's not sleeping well which I think is due to anxiety. Her daughter is having marital problems and may need to move back home.    Review of Systems     Objective:   Physical ExamNeck supple no thyromegaly; chest clear; cardiac exam regular rate and rhythm; extremities without edema.        Assessment & Plan   Hypertension   Hypertension  Hyperlipidemia  Hypertension  Hypertension Hypothyroidism Hyperlipidemia s/p lumbar spine L4-L5 decompression  Anxiety  Insomnia  Plan: Return in 6 months for physical examination. Continue with antihypertensive medication. Continue with same dose of Synthroid. Continue to watch diet. New prescription for Xanax 0.5 mg #180 one by mouth twice daily for anxiety and insomnia with 2 refills for mail order pharmacy. Also new prescription for local drugstore Xanax 0.05 mg #60 one by mouth twice  daily with no refill. Patient should take 0.5 mg of Xanax at bedtime

## 2011-01-15 NOTE — Patient Instructions (Signed)
Please find out from hospital if you get influenza immunization when you were there in October. If not you need to return here for influenza vaccine. Continue same dose of Synthroid and continue antihypertensive medication. Continue to watch diet. Return in 6 months for physical exam.

## 2011-02-01 ENCOUNTER — Ambulatory Visit (HOSPITAL_COMMUNITY)
Admission: RE | Admit: 2011-02-01 | Discharge: 2011-02-01 | Disposition: A | Discharge status: Home / Self Care | Payer: PRIVATE HEALTH INSURANCE | Source: Ambulatory Visit | Attending: Obstetrics and Gynecology | Admitting: Obstetrics and Gynecology

## 2011-02-01 DIAGNOSIS — Z1231 Encounter for screening mammogram for malignant neoplasm of breast: Secondary | ICD-10-CM | POA: Insufficient documentation

## 2011-02-05 ENCOUNTER — Other Ambulatory Visit: Payer: Self-pay | Admitting: Neurological Surgery

## 2011-02-05 DIAGNOSIS — M479 Spondylosis, unspecified: Secondary | ICD-10-CM

## 2011-02-09 ENCOUNTER — Ambulatory Visit
Admission: RE | Admit: 2011-02-09 | Discharge: 2011-02-09 | Disposition: A | Discharge status: Home / Self Care | Payer: PRIVATE HEALTH INSURANCE | Source: Ambulatory Visit | Attending: Neurological Surgery | Admitting: Neurological Surgery

## 2011-02-09 DIAGNOSIS — M479 Spondylosis, unspecified: Secondary | ICD-10-CM

## 2011-02-16 ENCOUNTER — Other Ambulatory Visit: Payer: Self-pay

## 2011-02-16 MED ORDER — LEVOTHYROXINE SODIUM 112 MCG PO TABS: 112.0000 ug | ORAL_TABLET | Freq: Every day | ORAL | Status: DC

## 2011-02-26 ENCOUNTER — Ambulatory Visit (INDEPENDENT_AMBULATORY_CARE_PROVIDER_SITE_OTHER): Payer: PRIVATE HEALTH INSURANCE | Admitting: Internal Medicine

## 2011-02-26 ENCOUNTER — Encounter: Payer: Self-pay | Admitting: Internal Medicine

## 2011-02-26 VITALS — BP 96/64 | HR 84 | Temp 98.1°F | Ht 63.0 in | Wt 131.0 lb

## 2011-02-26 DIAGNOSIS — L600 Ingrowing nail: Secondary | ICD-10-CM

## 2011-02-26 NOTE — Patient Instructions (Signed)
Keep cotton underneath toenail until it grows out and is no longer painful.

## 2011-02-26 NOTE — Progress Notes (Signed)
  Subjective:    Patient ID: Megan Williams, female    DOB: August 13, 1945, 65 y.o.   MRN: [id number]  HPI Patient has noninfected ingrown toenail right great toe. Says this is a frequent occurrence. Does get regular pedicures it nail salon. Says toe is painful.    Review of Systems     Objective:   Physical Exam no evidence of paronychia right great toe. Right great toe nail is becoming ingrown. Cotton was inserted underneath edge of toe nail. Patient was instructed to keep cotton under toenail until it grows out.         Assessment & Plan:  Ingrown toenail right great toe  Plan: Patient is to keep cotton under toe nail edge as directed until it grows out. If further problems occur, refer to podiatrist

## 2011-04-19 ENCOUNTER — Ambulatory Visit (INDEPENDENT_AMBULATORY_CARE_PROVIDER_SITE_OTHER): Payer: PRIVATE HEALTH INSURANCE | Admitting: Internal Medicine

## 2011-04-19 ENCOUNTER — Encounter: Payer: Self-pay | Admitting: Internal Medicine

## 2011-04-19 VITALS — BP 120/76 | HR 76 | Temp 98.6°F | Wt 129.5 lb

## 2011-04-19 DIAGNOSIS — J329 Chronic sinusitis, unspecified: Secondary | ICD-10-CM

## 2011-04-19 DIAGNOSIS — Z87891 Personal history of nicotine dependence: Secondary | ICD-10-CM

## 2011-04-30 ENCOUNTER — Encounter: Payer: Self-pay | Admitting: Gynecology

## 2011-04-30 DIAGNOSIS — G43909 Migraine, unspecified, not intractable, without status migrainosus: Secondary | ICD-10-CM | POA: Insufficient documentation

## 2011-04-30 DIAGNOSIS — M8580 Other specified disorders of bone density and structure, unspecified site: Secondary | ICD-10-CM | POA: Insufficient documentation

## 2011-05-03 ENCOUNTER — Ambulatory Visit (INDEPENDENT_AMBULATORY_CARE_PROVIDER_SITE_OTHER): Payer: PRIVATE HEALTH INSURANCE | Admitting: Internal Medicine

## 2011-05-03 ENCOUNTER — Encounter: Payer: Self-pay | Admitting: Internal Medicine

## 2011-05-03 VITALS — BP 106/66 | HR 76 | Temp 98.6°F | Wt 129.5 lb

## 2011-05-03 DIAGNOSIS — J40 Bronchitis, not specified as acute or chronic: Secondary | ICD-10-CM

## 2011-05-03 DIAGNOSIS — J449 Chronic obstructive pulmonary disease, unspecified: Secondary | ICD-10-CM | POA: Insufficient documentation

## 2011-05-03 DIAGNOSIS — Z87891 Personal history of nicotine dependence: Secondary | ICD-10-CM

## 2011-05-03 DIAGNOSIS — J329 Chronic sinusitis, unspecified: Secondary | ICD-10-CM

## 2011-05-03 DIAGNOSIS — J4489 Other specified chronic obstructive pulmonary disease: Secondary | ICD-10-CM

## 2011-05-03 MED ORDER — CEFTRIAXONE SODIUM 1 G IJ SOLR
1.0000 g | Freq: Once | INTRAMUSCULAR | Status: AC
  Administered 2011-05-03: 1 g via INTRAMUSCULAR

## 2011-05-03 NOTE — Patient Instructions (Signed)
Take antibiotics as prescribed. Call if not better in 7-10 days. 

## 2011-05-03 NOTE — Progress Notes (Signed)
  Subjective:    Patient ID: Megan Williams, female    DOB: [birth year], 66 y.o.   MRN: [id number]  HPI 66 year old white female smoker in today with URI symptoms. Has maxillary sinus congestion and discolored nasal drainage with some cough. Smokes at least half pack cigarettes daily. No fever or shaking chills.    Review of Systems     Objective:   Physical Exam sounds nasally congested. TMs are clear. Pharynx slightly injected. Neck supple. Chest clear.        Assessment & Plan:  Sinusitis  Probable early bronchitis  Plan: Levaquin 500 milligrams daily for 10 days. Hycodan 8 ounces 1 teaspoon every 6 hours as needed for cough.

## 2011-05-03 NOTE — Patient Instructions (Signed)
You have been given an injection of antibiotic today in the office. Take Sterapred DS 10 mg 6 day dosepak as directed. Take Avelox daily with a meal for 10 days. Use Ventolin inhaler 2 sprays by mouth 4 times daily until symptoms resolve. Uses a maintenance inhaler from now on Advair 250/50 one spray every 12 hours. Once you are better consider starting Chantix starter pack

## 2011-05-03 NOTE — Progress Notes (Signed)
  Subjective:    Patient ID: Megan Williams, female    DOB: [birth year], 66 y.o.   MRN: [id number]  HPI patient was here February 18 with URI symptoms. She was treated with Levaquin and hydrocodone cough syrup. History of smoking at least a half pack cigarettes daily for 35 years. Doesn't really want to quit and says she has no will power. Has tried Chantix in the past. Says coughing has persisted. Denies significant shortness of breath. Pulse oximetry today is 96% on room air. Says she has discolored sputum production. Has been fatigued. No fever or shaking chills. Patient had chest x-ray in the fall of 2012 prior to surgery showing some increased lung volumes consistent with COPD otherwise negative.    Review of Systems     Objective:   Physical Exam patient looks fatigued. Has congested cough. HEENT exam: TMs are clear, pharynx is clear. Neck is supple without adenopathy. Chest clear.        Assessment & Plan:  Bronchitis  COPD  Plan: Discussed smoking cessation with patient. Discussed likelihood that she has some COPD at this point in time. Have prescribed Advair 250/50 one spray by mouth every 12 hours as a maintenance inhaler. Ventolin inhaler 2 sprays by mouth 4 times a day when necessary acute respiratory infections with when necessary one year refills. 1 g IM Rocephin given today. Change to Avelox 400 mg daily for 10 days. Sterapred DS 10 mg 6 day dosepak. Chantix starter pack with one refill

## 2011-05-10 ENCOUNTER — Other Ambulatory Visit (HOSPITAL_COMMUNITY)
Admission: RE | Admit: 2011-05-10 | Discharge: 2011-05-10 | Disposition: A | Discharge status: Home / Self Care | Payer: PRIVATE HEALTH INSURANCE | Source: Ambulatory Visit | Attending: Obstetrics and Gynecology | Admitting: Obstetrics and Gynecology

## 2011-05-10 ENCOUNTER — Ambulatory Visit (INDEPENDENT_AMBULATORY_CARE_PROVIDER_SITE_OTHER): Payer: PRIVATE HEALTH INSURANCE | Admitting: Obstetrics and Gynecology

## 2011-05-10 ENCOUNTER — Encounter: Payer: Self-pay | Admitting: Obstetrics and Gynecology

## 2011-05-10 VITALS — BP 112/66 | Ht 64.0 in | Wt 131.0 lb

## 2011-05-10 DIAGNOSIS — N951 Menopausal and female climacteric states: Secondary | ICD-10-CM

## 2011-05-10 DIAGNOSIS — Z01419 Encounter for gynecological examination (general) (routine) without abnormal findings: Secondary | ICD-10-CM | POA: Insufficient documentation

## 2011-05-10 DIAGNOSIS — M8580 Other specified disorders of bone density and structure, unspecified site: Secondary | ICD-10-CM

## 2011-05-10 DIAGNOSIS — K209 Esophagitis, unspecified without bleeding: Secondary | ICD-10-CM | POA: Insufficient documentation

## 2011-05-10 DIAGNOSIS — Z780 Asymptomatic menopausal state: Secondary | ICD-10-CM

## 2011-05-10 DIAGNOSIS — M949 Disorder of cartilage, unspecified: Secondary | ICD-10-CM

## 2011-05-10 DIAGNOSIS — M899 Disorder of bone, unspecified: Secondary | ICD-10-CM

## 2011-05-10 LAB — ESTRADIOL: Estradiol: 39.6 pg/mL

## 2011-05-10 NOTE — Progress Notes (Signed)
Patient came to see me today for her annual GYN exam. She's been having hot flashes for the last month. She is waking up 2-3 times a night soaked. She had back surgery in October. Her PCP had switched her Synthroid from 100 to 112 MCG's within the last year. She had wanted to recheck her thyroid but was waiting until May to be sure no anesthetic aftereffects. She's had a normal mammogram within the last year. She has low bone mass on bone density without elevated FRAX risk. She has had no fractures. She does lab with her PCP. She is having no vaginal bleeding. She is having no pelvic pain.  HEENT: Within normal limits. Kennon Portela present. Neck: No masses. Supraclavicular lymph nodes: Not enlarged. Breasts: Examined in both sitting and lying position. Symmetrical without skin changes or masses. Abdomen: Soft no masses guarding or rebound. No hernias. Pelvic: External within normal limits. BUS within normal limits. Vaginal examination shows good estrogen effect, no cystocele enterocele or rectocele. Cervix and uterus absent. Adnexa within normal limits. Rectovaginal confirmatory. Extremities within normal limits.  Assessment: Menopausal symptoms on estrogen. Hypothyroidism. Osteopenia.  Plan: Serum estradiol drawn. We'll call patient with results. The issue may be need to change her thyroid dose. Continue yearly mammograms. Bone density December 2013.

## 2011-05-11 LAB — URINALYSIS W MICROSCOPIC + REFLEX CULTURE
Bacteria, UA: NONE SEEN
Bilirubin Urine: NEGATIVE
Casts: NONE SEEN
Crystals: NONE SEEN
Glucose, UA: NEGATIVE mg/dL
Hgb urine dipstick: NEGATIVE
Ketones, ur: NEGATIVE mg/dL
Leukocytes, UA: NEGATIVE
Nitrite: NEGATIVE
Protein, ur: NEGATIVE mg/dL
Specific Gravity, Urine: 1.017 (ref 1.005–1.030)
Squamous Epithelial / LPF: NONE SEEN
Urobilinogen, UA: 0.2 mg/dL (ref 0.0–1.0)
pH: 5.5 (ref 5.0–8.0)

## 2011-05-11 MED ORDER — ESTRADIOL 1 MG PO TABS: 1.5000 mg | ORAL_TABLET | Freq: Every day | ORAL | Status: DC

## 2011-05-11 MED ORDER — FLUCONAZOLE 150 MG PO TABS: 150.0000 mg | ORAL_TABLET | Freq: Every day | ORAL | Status: AC

## 2011-05-11 NOTE — Progress Notes (Signed)
Addended by: Venora Maples on: 05/11/2011 03:14 PM   Modules accepted: Orders

## 2011-05-11 NOTE — Progress Notes (Signed)
Addended by: Venora Maples on: 05/11/2011 03:18 PM   Modules accepted: Orders

## 2011-05-27 ENCOUNTER — Other Ambulatory Visit: Payer: Self-pay

## 2011-05-27 MED ORDER — ESCITALOPRAM OXALATE 10 MG PO TABS: 10.0000 mg | ORAL_TABLET | Freq: Every day | ORAL | Status: DC

## 2011-06-19 ENCOUNTER — Other Ambulatory Visit: Payer: Self-pay | Admitting: Internal Medicine

## 2011-07-15 ENCOUNTER — Other Ambulatory Visit: Payer: PRIVATE HEALTH INSURANCE | Admitting: Internal Medicine

## 2011-07-15 DIAGNOSIS — Z0000 Encounter for general adult medical examination without abnormal findings: Secondary | ICD-10-CM

## 2011-07-15 DIAGNOSIS — E785 Hyperlipidemia, unspecified: Secondary | ICD-10-CM

## 2011-07-15 DIAGNOSIS — E039 Hypothyroidism, unspecified: Secondary | ICD-10-CM

## 2011-07-15 LAB — COMPREHENSIVE METABOLIC PANEL
ALT: 8 U/L (ref 0–35)
AST: 17 U/L (ref 0–37)
Albumin: 4.2 g/dL (ref 3.5–5.2)
Alkaline Phosphatase: 54 U/L (ref 39–117)
BUN: 10 mg/dL (ref 6–23)
CO2: 26 mEq/L (ref 19–32)
Calcium: 9.6 mg/dL (ref 8.4–10.5)
Chloride: 105 mEq/L (ref 96–112)
Creat: 0.82 mg/dL (ref 0.50–1.10)
Glucose, Bld: 102 mg/dL — ABNORMAL HIGH (ref 70–99)
Potassium: 4.5 mEq/L (ref 3.5–5.3)
Sodium: 138 mEq/L (ref 135–145)
Total Bilirubin: 0.4 mg/dL (ref 0.3–1.2)
Total Protein: 6.7 g/dL (ref 6.0–8.3)

## 2011-07-15 LAB — CBC WITH DIFFERENTIAL/PLATELET
Basophils Absolute: 0 10*3/uL (ref 0.0–0.1)
Basophils Relative: 0 % (ref 0–1)
Eosinophils Absolute: 0 10*3/uL (ref 0.0–0.7)
Eosinophils Relative: 1 % (ref 0–5)
HCT: 36.3 % (ref 36.0–46.0)
Hemoglobin: 11.4 g/dL — ABNORMAL LOW (ref 12.0–15.0)
Lymphocytes Relative: 29 % (ref 12–46)
Lymphs Abs: 2.5 10*3/uL (ref 0.7–4.0)
MCH: 27.4 pg (ref 26.0–34.0)
MCHC: 31.4 g/dL (ref 30.0–36.0)
MCV: 87.3 fL (ref 78.0–100.0)
Monocytes Absolute: 0.4 10*3/uL (ref 0.1–1.0)
Monocytes Relative: 5 % (ref 3–12)
Neutro Abs: 5.6 10*3/uL (ref 1.7–7.7)
Neutrophils Relative %: 65 % (ref 43–77)
Platelets: 327 10*3/uL (ref 150–400)
RBC: 4.16 MIL/uL (ref 3.87–5.11)
RDW: 16 % — ABNORMAL HIGH (ref 11.5–15.5)
WBC: 8.5 10*3/uL (ref 4.0–10.5)

## 2011-07-15 LAB — LIPID PANEL
Cholesterol: 188 mg/dL (ref 0–200)
HDL: 62 mg/dL (ref >39)
LDL Cholesterol: 88 mg/dL (ref 0–99)
Total CHOL/HDL Ratio: 3 Ratio
Triglycerides: 188 mg/dL — ABNORMAL HIGH (ref <150)
VLDL: 38 mg/dL (ref 0–40)

## 2011-07-15 LAB — TSH: TSH: 0.301 u[IU]/mL — ABNORMAL LOW (ref 0.350–4.500)

## 2011-07-16 LAB — VITAMIN D 25 HYDROXY (VIT D DEFICIENCY, FRACTURES): Vit D, 25-Hydroxy: 70 ng/mL (ref 30–89)

## 2011-07-19 ENCOUNTER — Ambulatory Visit (INDEPENDENT_AMBULATORY_CARE_PROVIDER_SITE_OTHER): Payer: PRIVATE HEALTH INSURANCE | Admitting: Internal Medicine

## 2011-07-19 ENCOUNTER — Encounter: Payer: Self-pay | Admitting: Internal Medicine

## 2011-07-19 VITALS — BP 106/66 | HR 76 | Temp 98.0°F | Ht 62.5 in | Wt 130.0 lb

## 2011-07-19 DIAGNOSIS — E785 Hyperlipidemia, unspecified: Secondary | ICD-10-CM

## 2011-07-19 DIAGNOSIS — F32A Depression, unspecified: Secondary | ICD-10-CM

## 2011-07-19 DIAGNOSIS — J309 Allergic rhinitis, unspecified: Secondary | ICD-10-CM

## 2011-07-19 DIAGNOSIS — K219 Gastro-esophageal reflux disease without esophagitis: Secondary | ICD-10-CM

## 2011-07-19 DIAGNOSIS — F329 Major depressive disorder, single episode, unspecified: Secondary | ICD-10-CM

## 2011-07-19 DIAGNOSIS — Z0000 Encounter for general adult medical examination without abnormal findings: Secondary | ICD-10-CM

## 2011-07-19 DIAGNOSIS — F419 Anxiety disorder, unspecified: Secondary | ICD-10-CM

## 2011-07-19 DIAGNOSIS — Z87891 Personal history of nicotine dependence: Secondary | ICD-10-CM

## 2011-07-19 DIAGNOSIS — I10 Essential (primary) hypertension: Secondary | ICD-10-CM

## 2011-07-19 DIAGNOSIS — E039 Hypothyroidism, unspecified: Secondary | ICD-10-CM

## 2011-07-19 DIAGNOSIS — J449 Chronic obstructive pulmonary disease, unspecified: Secondary | ICD-10-CM

## 2011-07-19 LAB — POCT URINALYSIS DIPSTICK
Bilirubin, UA: NEGATIVE
Blood, UA: NEGATIVE
Glucose, UA: NEGATIVE
Ketones, UA: NEGATIVE
Leukocytes, UA: NEGATIVE
Nitrite, UA: NEGATIVE
Protein, UA: NEGATIVE
Spec Grav, UA: >=1.03
Urobilinogen, UA: NEGATIVE
pH, UA: 5.5

## 2011-07-19 NOTE — Progress Notes (Signed)
Subjective:    Patient ID: Megan Williams, female    DOB: 02/08/1946, 66 y.o.   MRN: [id number]  HPI  66 year old white female with history of hyperlipidemia, hypothyroidism, anxiety, COPD exacerbations on occasion, cigarette abuse, hypertension, allergic rhinitis, history of vitamin D deficiency for health maintenance exam. She had Zostavax vaccine December 2012 at [facility]. GYN physician is Dr. Eda Paschal. She saw him in early 2013. History of L4-L5 decompression and stabilization by Dr. Danielle Dess October 2012. Had bone density study she thinks and 2012 it so release. Colonoscopy by Dr. Loreta Ave.October 25. History of GE reflux maintained on generic Protonix daily. Smokes approximately 1/2 pack of cigarettes daily. Says blood pressure has been low on the sun up real 10 mg daily and she has decreased it to 5 mg daily i.e. one half of a 10 mg tablet. She has smoked for over 40 years. Does not consume alcohol.  Tonsillectomy 1963, thyroidectomy 1968, hysterectomy without oophorectomy 1979, cholecystectomy 1993, cervical spine 3 level fusion C4-C7 done in 2005 by Dr. Danielle Dess. Joint reconstruction of right thumb 2008 by Dr. Margaree Mackintosh. Has been on SSRI medication for a number of years. Previously took Prozac under Dr. Karma Ganja and Cymbalta under the care of Dr. Bradd Canary. Currently on Lexapro. Has tried Chantix for cigarette addiction but it causes bad drains. Previously tried Wellbutrin but was unable to stop smoking. Wants to try Wellbutrin once again.  Says energy level is good but doesn't sleep well because of drains would Chantix. She is adopted and has 2 adult children. She is married. 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 She and her husband are retired.  Family history unknown since she is adopted    Review of Systems  Constitutional: Negative.   HENT: Negative.   Eyes: Negative.   Respiratory:       Occasional exacerbations of COPD with wheezing. Doesn't want to take Advair  daily because of expense  Cardiovascular: Negative.   Gastrointestinal:       History of GE reflux. Colonoscopy done in 2005 by Dr. Ewing Schlein with repeat study planned 2015  Genitourinary: Negative.   Neurological: Negative.   Hematological: Negative.   Psychiatric/Behavioral:       History of anxiety       Objective:   Physical Exam  Vitals reviewed. Constitutional: She is oriented to person, place, and time. She appears well-developed and well-nourished. No distress.  HENT:  Head: Normocephalic and atraumatic.  Right Ear: External ear normal.  Left Ear: External ear normal.  Nose: Nose normal.  Mouth/Throat: Oropharynx is clear and moist. No oropharyngeal exudate.  Eyes: Conjunctivae and EOM are normal. Pupils are equal, round, and reactive to light. Right eye exhibits no discharge. Left eye exhibits no discharge. No scleral icterus.  Neck: Neck supple. No JVD present. No thyromegaly present.  Cardiovascular: Normal rate, regular rhythm and normal heart sounds.  Exam reveals no gallop.   No murmur heard. Pulmonary/Chest: Effort normal and breath sounds normal. She has no wheezes. She has no rales.  Abdominal: Soft. Bowel sounds are normal. She exhibits no mass. There is no tenderness. There is no rebound and no guarding.  Genitourinary:       Deferred to GYN physician last done 2013  Musculoskeletal: Normal range of motion. She exhibits no edema.  Lymphadenopathy:    She has no cervical adenopathy.  Neurological: She is alert and oriented to person, place, and time. She has normal reflexes. No cranial nerve deficit. Coordination  normal.  Skin: Skin is warm and dry. No rash noted. She is not diaphoretic.  Psychiatric: She has a normal mood and affect. Her behavior is normal. Judgment and thought content normal.          Assessment & Plan:  Hypertension  Allergic rhinitis  COPD  Hypothyroidism  History of vitamin D deficiency  Anxiety depression  Cigarette  abuse  Hyperlipidemia  Plan: Patient. Chantix which is causing bad drains. Will start Wellbutrin XL 150 mg daily every morning. She has tablets of Wellbutrin on hand. Continue same dose of Synthroid for nail. Hemoglobin is noted to be 11.4 g and previously was higher in the 13-14 g range. She has been given 3 Hemoccult cards and asked return in 2 months for office visit, smoking cessation counseling, and repeat CBC at that time. Have refilled multiple 90 day prescriptions today including Lexapro, Synthroid, Xanax. Vitamin D level is normal. She is on vitamin D supplementation. Says GYN ordered bone density study last year. Tetanus immunization is up-to-date. Wants to stop Advair because of expense. Stop Lisinopril. She will keep blood pressure readings at home. Says if she takes 10 mg daily of lisinopril blood pressure falls to low.

## 2011-07-19 NOTE — Patient Instructions (Signed)
Return in 2 months to followup on smoking cessation and decreased hemoglobin noted today on fasting lab work. Stop Chantix. Start Wellbutrin. Stop lisinopril. Watch blood pressure measurements and bring them in at next visit. Return in 3 Hemoccult cards to this office.

## 2011-09-23 ENCOUNTER — Other Ambulatory Visit: Payer: Self-pay | Admitting: Internal Medicine

## 2011-09-23 ENCOUNTER — Other Ambulatory Visit: Payer: PRIVATE HEALTH INSURANCE | Admitting: Internal Medicine

## 2011-09-23 DIAGNOSIS — D649 Anemia, unspecified: Secondary | ICD-10-CM

## 2011-09-23 LAB — CBC WITH DIFFERENTIAL/PLATELET
Basophils Absolute: 0 K/uL (ref 0.0–0.1)
Basophils Relative: 0 % (ref 0–1)
Eosinophils Absolute: 0 K/uL (ref 0.0–0.7)
Eosinophils Relative: 0 % (ref 0–5)
HCT: 33.3 % — ABNORMAL LOW (ref 36.0–46.0)
Hemoglobin: 10.9 g/dL — ABNORMAL LOW (ref 12.0–15.0)
Lymphocytes Relative: 28 % (ref 12–46)
Lymphs Abs: 2.7 K/uL (ref 0.7–4.0)
MCH: 27.5 pg (ref 26.0–34.0)
MCHC: 32.7 g/dL (ref 30.0–36.0)
MCV: 84.1 fL (ref 78.0–100.0)
Monocytes Absolute: 0.5 K/uL (ref 0.1–1.0)
Monocytes Relative: 5 % (ref 3–12)
Neutro Abs: 6.6 K/uL (ref 1.7–7.7)
Neutrophils Relative %: 67 % (ref 43–77)
Platelets: 326 K/uL (ref 150–400)
RBC: 3.96 MIL/uL (ref 3.87–5.11)
RDW: 16.6 % — ABNORMAL HIGH (ref 11.5–15.5)
WBC: 9.9 K/uL (ref 4.0–10.5)

## 2011-09-24 ENCOUNTER — Ambulatory Visit (INDEPENDENT_AMBULATORY_CARE_PROVIDER_SITE_OTHER): Payer: PRIVATE HEALTH INSURANCE | Admitting: Internal Medicine

## 2011-09-24 ENCOUNTER — Encounter: Payer: Self-pay | Admitting: Internal Medicine

## 2011-09-24 VITALS — BP 122/78 | HR 80 | Temp 98.8°F | Ht 62.5 in | Wt 132.0 lb

## 2011-09-24 DIAGNOSIS — F411 Generalized anxiety disorder: Secondary | ICD-10-CM

## 2011-09-24 DIAGNOSIS — F419 Anxiety disorder, unspecified: Secondary | ICD-10-CM

## 2011-09-24 DIAGNOSIS — Z87891 Personal history of nicotine dependence: Secondary | ICD-10-CM

## 2011-09-24 DIAGNOSIS — W899XXA Exposure to unspecified man-made visible and ultraviolet light, initial encounter: Secondary | ICD-10-CM

## 2011-09-24 DIAGNOSIS — D649 Anemia, unspecified: Secondary | ICD-10-CM

## 2011-09-24 DIAGNOSIS — Z1211 Encounter for screening for malignant neoplasm of colon: Secondary | ICD-10-CM

## 2011-09-24 DIAGNOSIS — X32XXXA Exposure to sunlight, initial encounter: Secondary | ICD-10-CM

## 2011-09-24 DIAGNOSIS — E039 Hypothyroidism, unspecified: Secondary | ICD-10-CM

## 2011-09-24 LAB — FOLATE: Folate: 13.1 ng/mL

## 2011-09-24 LAB — RETICULOCYTES
ABS Retic: 59.3 10*3/uL (ref 19.0–186.0)
RBC.: 3.95 MIL/uL (ref 3.87–5.11)
Retic Ct Pct: 1.5 % (ref 0.4–2.3)

## 2011-09-24 LAB — VITAMIN B12: Vitamin B-12: 219 pg/mL (ref 211–911)

## 2011-09-24 LAB — IRON AND TIBC
%SAT: 6 % — ABNORMAL LOW (ref 20–55)
Iron: 29 ug/dL — ABNORMAL LOW (ref 42–145)
TIBC: 452 ug/dL (ref 250–470)
UIBC: 423 ug/dL — ABNORMAL HIGH (ref 125–400)

## 2011-09-25 NOTE — Patient Instructions (Addendum)
Stop aspirin and NSAID agents. Return in 3 Hemoccult cards. We will inform you of anemia study results soon

## 2011-09-25 NOTE — Progress Notes (Signed)
  Subjective:    Patient ID: Megan Williams, female    DOB: 1945-11-27, 66 y.o.   MRN: [id number]  HPI 66 year old white female in today to followup on possible anemia. At last visit she had a drop in her hemoglobin for unknown reasons. She was advised to return for followup CBC. Patient does take aspirin and nonsteroidal anti-inflammatory drugs. Has had a colonoscopy in the past. No history of melena that she is aware of. She returned 3 Hemoccult cards recently but for some reason they were not recorded an Epic. Was given another set to do today. Also following up on smoking cessation. Cannot take Chantix because it caused her to have bad dreams. She has been a Wellbutrin. Not really able to cut back very much. Explained today about possibility of iron deficiency. She could perhaps have occult GI blood loss taking aspirin and NSAIDS. Explained that we would do iron/ iron binding capacity, B12 and folate levels.    Review of Systems     Objective:   Physical Exam neck supple without thyromegaly. Chest clear to auscultation. Cardiac exam regular rate and rhythm. Extremities without edema. She is very tan. She is slightly anxious.        Assessment & Plan:  Heavy sun exposure Anemia-confirmed with repeat CBC  History of smoking-has not been able to cut back very much. Cannot tolerate Chantix.  Hypothyroidism  Anxiety  Plan: Anemia studies will be done. Patient may need to see GI regarding workup if this proves to be an iron deficiency situation. Was given another set of Hemoccult cards to return to this office.

## 2011-09-27 ENCOUNTER — Telehealth: Payer: Self-pay

## 2011-09-27 NOTE — Progress Notes (Signed)
Spoke with patient and scheduled her appointment with Dr, Ewing Schlein

## 2011-09-27 NOTE — Telephone Encounter (Signed)
Patient scheduled for appointment with Dr. Berton Lan on 10/04/2011 at 1:00pm. Records to be faxed. Patient aware.

## 2011-09-29 LAB — HEMOCCULT GUIAC POC 1CARD (OFFICE)
Card #2 Fecal Occult Blod, POC: NEGATIVE
Card #3 Fecal Occult Blood, POC: NEGATIVE
Fecal Occult Blood, POC: NEGATIVE

## 2011-09-29 NOTE — Addendum Note (Signed)
Addended by: Judy Pimple on: 09/29/2011 12:13 PM   Modules accepted: Orders

## 2011-09-30 ENCOUNTER — Other Ambulatory Visit: Payer: Self-pay | Admitting: Internal Medicine

## 2011-09-30 ENCOUNTER — Telehealth: Payer: Self-pay | Admitting: Internal Medicine

## 2011-09-30 NOTE — Telephone Encounter (Signed)
She is having headaches from withdrawal from taking Aleve everyday. Call in Sterapred Ds 10 mg dosepak 6 day to drugstore. No analgesics. This will pass within a couple of weeks. Has to work through this.

## 2011-09-30 NOTE — Telephone Encounter (Signed)
Sterepred dosepak called to the pharmacy, although patient is reluctant to take it- states it makes her crazy. Also is getting another herpes outbreak in and outside  Her nose. Has Valtrex to take.

## 2011-10-11 ENCOUNTER — Telehealth: Payer: Self-pay | Admitting: *Deleted

## 2011-10-11 DIAGNOSIS — Z780 Asymptomatic menopausal state: Secondary | ICD-10-CM

## 2011-10-11 NOTE — Telephone Encounter (Signed)
Pt informed with the below note,order placed. Pt will 10/12/11 @4 :00

## 2011-10-11 NOTE — Addendum Note (Signed)
Addended by: Aura Camps on: 10/11/2011 10:26 AM   Modules accepted: Orders

## 2011-10-11 NOTE — Telephone Encounter (Signed)
Pt is currently taking estradiol 1.5 mg daily per result note 05/10/11 pt was told to follow up in 6 months if not feeling better. Pt is still c/o extreme night sweats and no sleep at night due to this. Please advise

## 2011-10-11 NOTE — Telephone Encounter (Signed)
Have patient come in for serum estradiol. I wonder if she's not absorbing her medication.

## 2011-10-12 ENCOUNTER — Other Ambulatory Visit: Payer: PRIVATE HEALTH INSURANCE

## 2011-10-12 DIAGNOSIS — Z780 Asymptomatic menopausal state: Secondary | ICD-10-CM

## 2011-10-15 ENCOUNTER — Other Ambulatory Visit: Payer: Self-pay | Admitting: Obstetrics and Gynecology

## 2011-10-15 LAB — ESTRADIOL: Estradiol: 41.5 pg/mL

## 2011-10-15 MED ORDER — ESTRADIOL 0.075 MG/24HR TD PTTW: 1.0000 | MEDICATED_PATCH | TRANSDERMAL | Status: DC

## 2011-10-21 ENCOUNTER — Ambulatory Visit: Payer: PRIVATE HEALTH INSURANCE | Admitting: Internal Medicine

## 2011-11-08 ENCOUNTER — Other Ambulatory Visit: Payer: Self-pay | Admitting: Gastroenterology

## 2011-11-11 ENCOUNTER — Telehealth: Payer: Self-pay | Admitting: *Deleted

## 2011-11-11 MED ORDER — ESTRADIOL 0.075 MG/24HR TD PTTW: 1.0000 | MEDICATED_PATCH | TRANSDERMAL | Status: DC

## 2011-11-11 NOTE — Telephone Encounter (Signed)
Pt said vivelle-dot is working great she would like to have rx sent to mail order. rx sent.

## 2011-11-25 ENCOUNTER — Telehealth: Payer: Self-pay | Admitting: Internal Medicine

## 2011-11-25 NOTE — Telephone Encounter (Addendum)
Patient recently sent note via mail that she was having extreme fatigue. She recently had endoscopy by Dr. Ewing Schlein. She is on Lexapro 10 mg daily. Complaining of  symptoms such as cold intolerance and craving Coke with chipped ice. Complains of lack of energy. We are going to change Lexapro to Pristiq 50 mg daily. She was mailed a prescription for Pristiq 50 mg #30 with refills plus prescription card for savings. She is to call with progress report in 2-3 weeks. She is on an adequate dose of thyroid replacement 0.112 mg daily.

## 2011-12-15 ENCOUNTER — Ambulatory Visit (INDEPENDENT_AMBULATORY_CARE_PROVIDER_SITE_OTHER): Payer: PRIVATE HEALTH INSURANCE | Admitting: Obstetrics and Gynecology

## 2011-12-15 ENCOUNTER — Telehealth: Payer: Self-pay

## 2011-12-15 DIAGNOSIS — N951 Menopausal and female climacteric states: Secondary | ICD-10-CM

## 2011-12-15 DIAGNOSIS — R232 Flushing: Secondary | ICD-10-CM

## 2011-12-15 LAB — TSH: TSH: 1.499 u[IU]/mL (ref 0.350–4.500)

## 2011-12-15 LAB — ESTRADIOL: Estradiol: 32 pg/mL

## 2011-12-15 NOTE — Patient Instructions (Signed)
I will call you tomorrow.

## 2011-12-15 NOTE — Progress Notes (Addendum)
Patient came to see me today with a history of several months of both night sweats and hot flashes during the day. We had switched her from oral estradiol and estrogen patch because of symptoms and for several months she was asymptomatic. She is hypothyroid and had her TSH checked in May and she was slightly over replaced. She was started on Pristiq 3 weeks ago and it has helped her symptoms but the night sweats preceded starting the Pristiq.   We checked a TSH and serum estradiol today and I will call her tomorrow at home with results and what  we should do next.  Addendum: Patient's TSH was normal. Her estradiol was on the low side. It actually dropped since the last time we measured it. We increased her patch  To .1 mg twice a week.

## 2011-12-15 NOTE — Telephone Encounter (Signed)
Labs from Dr. Ewing Schlein reviewed by Dr. Lenord Fellers- states she needs followup with her 1 month. Patient advised

## 2011-12-16 MED ORDER — ESTRADIOL 0.1 MG/24HR TD PTTW: 1.0000 | MEDICATED_PATCH | TRANSDERMAL | Status: DC

## 2011-12-16 NOTE — Addendum Note (Signed)
Addended by: Trellis Paganini on: 12/16/2011 08:32 AM   Modules accepted: Orders

## 2011-12-21 ENCOUNTER — Telehealth: Payer: Self-pay | Admitting: Internal Medicine

## 2011-12-21 MED ORDER — DESVENLAFAXINE SUCCINATE ER 50 MG PO TB24: 50.0000 mg | ORAL_TABLET | Freq: Every day | ORAL | Status: DC

## 2011-12-21 NOTE — Telephone Encounter (Signed)
Note that Pristiq is helping fatigue. GI work up unremarkable.

## 2011-12-24 ENCOUNTER — Other Ambulatory Visit: Payer: Self-pay | Admitting: Gastroenterology

## 2011-12-24 DIAGNOSIS — R109 Unspecified abdominal pain: Secondary | ICD-10-CM

## 2011-12-24 DIAGNOSIS — K921 Melena: Secondary | ICD-10-CM

## 2011-12-27 ENCOUNTER — Telehealth: Payer: Self-pay | Admitting: *Deleted

## 2011-12-27 NOTE — Telephone Encounter (Signed)
Pt informed with the below note, she will follow up as needed. 

## 2011-12-27 NOTE — Telephone Encounter (Signed)
Pt was given vivelle dot patch 0.1 mg increase on 12/15/11. Pt said she still has hot flashes, not as bad prior to increase. But the night sweats are still there as well and its "driving her crazy" pt asked me to relay to you.

## 2011-12-27 NOTE — Telephone Encounter (Signed)
It is too soon to give up. The next step would be to recheck your serum estradiol level. I would wait 2 more weeks to do that.

## 2011-12-29 ENCOUNTER — Ambulatory Visit
Admission: RE | Admit: 2011-12-29 | Discharge: 2011-12-29 | Disposition: A | Discharge status: Home / Self Care | Payer: PRIVATE HEALTH INSURANCE | Source: Ambulatory Visit | Attending: Gastroenterology | Admitting: Gastroenterology

## 2011-12-29 DIAGNOSIS — K921 Melena: Secondary | ICD-10-CM

## 2011-12-29 DIAGNOSIS — R109 Unspecified abdominal pain: Secondary | ICD-10-CM

## 2011-12-29 MED ORDER — IOHEXOL 300 MG/ML  SOLN
100.0000 mL | Freq: Once | INTRAMUSCULAR | Status: AC | PRN
  Administered 2011-12-29: 100 mL via INTRAVENOUS

## 2012-01-08 ENCOUNTER — Encounter: Payer: Self-pay | Admitting: Obstetrics and Gynecology

## 2012-01-20 ENCOUNTER — Other Ambulatory Visit: Payer: PRIVATE HEALTH INSURANCE | Admitting: Internal Medicine

## 2012-01-20 DIAGNOSIS — D509 Iron deficiency anemia, unspecified: Secondary | ICD-10-CM

## 2012-01-20 DIAGNOSIS — E559 Vitamin D deficiency, unspecified: Secondary | ICD-10-CM

## 2012-01-20 DIAGNOSIS — D649 Anemia, unspecified: Secondary | ICD-10-CM

## 2012-01-20 LAB — IRON AND TIBC
%SAT: 22 % (ref 20–55)
Iron: 91 ug/dL (ref 42–145)
TIBC: 417 ug/dL (ref 250–470)
UIBC: 326 ug/dL (ref 125–400)

## 2012-01-21 ENCOUNTER — Ambulatory Visit (INDEPENDENT_AMBULATORY_CARE_PROVIDER_SITE_OTHER): Payer: PRIVATE HEALTH INSURANCE | Admitting: Internal Medicine

## 2012-01-21 ENCOUNTER — Encounter: Payer: Self-pay | Admitting: Internal Medicine

## 2012-01-21 VITALS — BP 146/86 | HR 80 | Temp 97.7°F | Wt 137.5 lb

## 2012-01-21 DIAGNOSIS — F329 Major depressive disorder, single episode, unspecified: Secondary | ICD-10-CM

## 2012-01-21 DIAGNOSIS — F32A Depression, unspecified: Secondary | ICD-10-CM

## 2012-01-21 LAB — FERRITIN: Ferritin: 18 ng/mL (ref 10–291)

## 2012-01-21 NOTE — Progress Notes (Signed)
  Subjective:    Patient ID: Megan Williams, female    DOB: 1945-10-21, 66 y.o.   MRN: [id number]  HPI 66 year old white female with history of hypothyroidism, depression, iron deficiency anemia in today for followup. Iron deficiency has improved with over-the-counter supplement. Patient complaining of weight gain. Patient phoned in recently that she was unable to get out of bed and we changed her to Pristiq 50 mg daily. Says her appetite is improved considerably. She needs to cut back on food consumption. She's not sleeping well at night but not taking Xanax for anxiety and insomnia. She tells me today she had been married previously and has 3 children from first marriage. Her daughter is 68 years old and has 2 children. She is asked to baby sit these children a lot. She's worried about grandson who has begun to smoke at 70 years of age. 53-year-old granddaughter is "out of control " with behavior. Teenage grandson has been in trouble with drugs. Her current husband's family is estranged from him since the 19s. Therefore he doesn't understand why she is so worried about her family. Patient seems to want medication to fix everything issue with her  and doesn't really want to take responsibility for  setting limits with her dysfunctional family. Unwilling to go to counseling. She continues to smoke. Doesn't want to quit.  Recently had thorough GI workup with Dr. Ewing Schlein who found no evidence of gastric ulcer or colon lesion to explain iron deficiency anemia. We are postulating it is a result of NSAIDS. We have advised her to stop taking NSAIDS. Spent greater than 25 minutes speaking with patient today about these issues and confronting her with lack of motivation to diet. I overheard her stay in the hallway that she wanted medication to stop the weight gain. Explained to her that SSRI medication could indeed cause some weight gain but she would have to exhibit some restraint with her food consumption. Says GYN  physician is working with her to control hot flashes which have suddenly resumed and I suspect are related to anxiety. She says she lies awake at night praying for her family.    Review of Systems     Objective:   Physical Exam neck is supple without thyromegaly; chest clear to auscultation; cardiac exam regular rate and rhythm; extremities without edema. Thought process is appropriate. Judgment is normal. Alert and oriented x3. Skin is warm and dry.        Assessment & Plan:  History of iron deficiency anemia presumably related to NSAID use Anxiety depression-related to family situation  Hot flashes-suspect have anxiety basis  Hypothyroidism status post thyroidectomy 1968  Cigarette abuse  Hypertension  Hyperlipidemia  Plan: Continue over-the-counter iron supplement. Return in 4-6 months. Continue Pristiq. Take Xanax at bedtime for sleep and anxiety. Consider counseling.

## 2012-01-22 MED ORDER — DESVENLAFAXINE SUCCINATE ER 50 MG PO TB24: 50.0000 mg | ORAL_TABLET | Freq: Every day | ORAL | Status: DC

## 2012-01-22 NOTE — Patient Instructions (Addendum)
Take Xanax at night for sleep. Continue Pristiq. Consider counseling. Continue iron supplementation. Return in 4-6 months.

## 2012-01-31 ENCOUNTER — Other Ambulatory Visit: Payer: Self-pay | Admitting: Obstetrics and Gynecology

## 2012-01-31 ENCOUNTER — Telehealth: Payer: Self-pay | Admitting: Obstetrics and Gynecology

## 2012-01-31 ENCOUNTER — Other Ambulatory Visit: Payer: Self-pay

## 2012-01-31 MED ORDER — ESTRADIOL 2 MG PO TABS: 2.0000 mg | ORAL_TABLET | Freq: Every day | ORAL | Status: DC

## 2012-01-31 MED ORDER — ALPRAZOLAM 0.5 MG PO TABS: 0.5000 mg | ORAL_TABLET | Freq: Three times a day (TID) | ORAL | Status: DC | PRN

## 2012-01-31 NOTE — Telephone Encounter (Signed)
Patient called because she had communicated with Dr. Reece Agar via My Chart last week and let him know that she is not getting very much relief from her hotflashes/nightsweats with Estradiol 2 mg. Dr. Reece Agar had recommended office visit to discuss. That appointment is scheduled for 02/10/12 but she only has Estradiol through this Weds.  She will need another weeks worth to take her until appointment.  Dr. Reece Agar- Would you like me just to call in her 2 mg Estradiol to continue for another week until she can see you or different Rx?

## 2012-01-31 NOTE — Telephone Encounter (Signed)
Gives her enough estradiol until  her appointment.

## 2012-01-31 NOTE — Progress Notes (Signed)
rx called to pharmacy 

## 2012-01-31 NOTE — Telephone Encounter (Signed)
Left message for patient that Rx sent to her pharmacy.

## 2012-02-10 ENCOUNTER — Ambulatory Visit (INDEPENDENT_AMBULATORY_CARE_PROVIDER_SITE_OTHER): Payer: PRIVATE HEALTH INSURANCE | Admitting: Obstetrics and Gynecology

## 2012-02-10 DIAGNOSIS — N951 Menopausal and female climacteric states: Secondary | ICD-10-CM

## 2012-02-10 DIAGNOSIS — R232 Flushing: Secondary | ICD-10-CM

## 2012-02-10 LAB — ESTRADIOL: Estradiol: 80.9 pg/mL

## 2012-02-10 MED ORDER — ESTRADIOL 2 MG PO TABS: 2.0000 mg | ORAL_TABLET | Freq: Every day | ORAL | Status: DC

## 2012-02-10 NOTE — Progress Notes (Signed)
Patient came back today to discuss her continuing hot flashes. She has one minor one  in the morning But every night at 3 AM she wakes up with severe hot flash which keep her awake  for 30 minutes. We have attempted to change medications with her since March, 2013. We increased her estrogen from 1 mg to 1.5 mg to 2 mg. When the problem continued we switched her to a patch 0.075 mg and initially she did great. Then suddenly they reoccured and persisted on 0.1 mg patch. We are now back at 2 mg estradiol which she thinks works better but is not acceptable. Her thyroid function remains stable on replacement. She has been on first Lexapro and then pristiq for depression for 3 years. She is no longer depressed. Her pristiq  was lowered to 25 mg and she is sleeping better.  We had a very long discussion of the above. We rechecked her estradiol level which has remained lower on all medications. I've asked her to discontinue her antidepressant. I am wondering if it is contributing to her hot flashes.  She is going to find out if she can get Enjuvia 1.25 mg. We will then make a decision about what to do next.

## 2012-02-10 NOTE — Patient Instructions (Signed)
Call Dr. Lenord Fellers and ask her how to taper Pristiq. If you need help with a different  antidepressant once choice would be Dr. Andee Poles.

## 2012-02-11 ENCOUNTER — Ambulatory Visit (INDEPENDENT_AMBULATORY_CARE_PROVIDER_SITE_OTHER): Payer: PRIVATE HEALTH INSURANCE | Admitting: Internal Medicine

## 2012-02-11 ENCOUNTER — Encounter: Payer: Self-pay | Admitting: Internal Medicine

## 2012-02-11 VITALS — BP 138/82 | HR 68 | Temp 97.8°F | Wt 137.0 lb

## 2012-02-11 DIAGNOSIS — J329 Chronic sinusitis, unspecified: Secondary | ICD-10-CM

## 2012-02-11 DIAGNOSIS — Z87891 Personal history of nicotine dependence: Secondary | ICD-10-CM

## 2012-02-11 DIAGNOSIS — H6690 Otitis media, unspecified, unspecified ear: Secondary | ICD-10-CM

## 2012-02-11 MED ORDER — CEFTRIAXONE SODIUM 1 G IJ SOLR
1.0000 g | Freq: Once | INTRAMUSCULAR | Status: AC
  Administered 2012-02-11: 1 g via INTRAMUSCULAR

## 2012-02-11 NOTE — Progress Notes (Signed)
  Subjective:    Patient ID: ARIANNY PUN, female    DOB: 1945-06-26, 66 y.o.   MRN: [id number]  HPI 2 week history of URI symptoms. Coughing and sneezing. Cannot hear out of right ear. Discolored nasal drainage and sputum production. No fever or shaking chills. Gyn wants her off Pristiq- currently on 25 mg daily.    Review of Systems     Objective:   Physical Exam right TM is dull thickened and red. Left TM full but not red. Pharynx is slightly injected. Neck is supple without significant adenopathy. Sounds nasally congested. Chest clear to auscultation without rales or wheezing.        Assessment & Plan:  Sinusitis  Right otitis media  History of smoking  Plan: 1 g IM Rocephin given. Levaquin 500 milligrams daily for 7 days. Hycodan 8 ounces 1 teaspoon by mouth every 6 hours when necessary cough. Use Ventolin inhaler if needed for shortness of breath or wheezing.

## 2012-02-14 ENCOUNTER — Other Ambulatory Visit: Payer: Self-pay | Admitting: Obstetrics and Gynecology

## 2012-02-14 DIAGNOSIS — Z1231 Encounter for screening mammogram for malignant neoplasm of breast: Secondary | ICD-10-CM

## 2012-03-03 ENCOUNTER — Ambulatory Visit (HOSPITAL_COMMUNITY)
Admission: RE | Admit: 2012-03-03 | Discharge: 2012-03-03 | Disposition: A | Discharge status: Home / Self Care | Payer: PRIVATE HEALTH INSURANCE | Source: Ambulatory Visit | Attending: Obstetrics and Gynecology | Admitting: Obstetrics and Gynecology

## 2012-03-03 DIAGNOSIS — Z1231 Encounter for screening mammogram for malignant neoplasm of breast: Secondary | ICD-10-CM

## 2012-03-04 ENCOUNTER — Other Ambulatory Visit: Payer: Self-pay | Admitting: Internal Medicine

## 2012-03-05 NOTE — Patient Instructions (Signed)
Take Levaquin 500 milligrams daily for 7 days. If been given 1 g IM Rocephin. Use inhaler for cough and wheezing. Hycodan 8 ounces 1 teaspoon by mouth every 6 hours when necessary cough.

## 2012-03-22 LAB — HEMOCCULT SLIDES (X 3 CARDS)

## 2012-05-10 ENCOUNTER — Encounter: Payer: PRIVATE HEALTH INSURANCE | Admitting: Women's Health

## 2012-05-22 ENCOUNTER — Other Ambulatory Visit (HOSPITAL_COMMUNITY)
Admission: RE | Admit: 2012-05-22 | Discharge: 2012-05-22 | Disposition: A | Discharge status: Home / Self Care | Payer: PRIVATE HEALTH INSURANCE | Source: Ambulatory Visit | Attending: Obstetrics and Gynecology | Admitting: Obstetrics and Gynecology

## 2012-05-22 ENCOUNTER — Encounter: Payer: Self-pay | Admitting: Women's Health

## 2012-05-22 ENCOUNTER — Ambulatory Visit (INDEPENDENT_AMBULATORY_CARE_PROVIDER_SITE_OTHER): Payer: PRIVATE HEALTH INSURANCE | Admitting: Women's Health

## 2012-05-22 VITALS — BP 124/82 | Ht 63.0 in | Wt 134.0 lb

## 2012-05-22 DIAGNOSIS — M8580 Other specified disorders of bone density and structure, unspecified site: Secondary | ICD-10-CM

## 2012-05-22 DIAGNOSIS — Z01419 Encounter for gynecological examination (general) (routine) without abnormal findings: Secondary | ICD-10-CM

## 2012-05-22 DIAGNOSIS — M899 Disorder of bone, unspecified: Secondary | ICD-10-CM

## 2012-05-22 DIAGNOSIS — M949 Disorder of cartilage, unspecified: Secondary | ICD-10-CM

## 2012-05-22 DIAGNOSIS — Z79890 Hormone replacement therapy (postmenopausal): Secondary | ICD-10-CM

## 2012-05-22 MED ORDER — ESTRADIOL 2 MG PO TABS: 2.0000 mg | ORAL_TABLET | Freq: Every day | ORAL | Status: DC

## 2012-05-22 NOTE — Patient Instructions (Signed)

## 2012-05-22 NOTE — Progress Notes (Signed)
Megan Williams 11-13-1945 [id number]    History:    The patient presents for annual exam.  History of TVH for abnormal Paps in the 70s. Currently on estradiol 2 mg with occasional hot flushes. Negative colonoscopy 2013. Normal mammogram history/fibrous breasts. Osteopenia T score right femoral neck -1.9 with no significant change. FRAX 10%/2.1%. Has had zostavac.Marland Kitchen Smoker half pack per day.   Past medical history, past surgical history, family history and social history were all reviewed and documented in the EPIC chart. Retired Print production planner. 3 grown children, poor relationship with husband.   ROS:  A  ROS was performed and pertinent positives and negatives are included in the history.  Exam:  Filed Vitals:   05/22/12 0839  BP: 124/82    General appearance:  Normal Head/Neck:  Normal, without cervical or supraclavicular adenopathy. Thyroid:  Symmetrical, normal in size, without palpable masses or nodularity. Respiratory  Effort:  Normal  Auscultation:  Clear without wheezing or rhonchi Cardiovascular  Auscultation:  Regular rate, without rubs, murmurs or gallops  Edema/varicosities:  Not grossly evident Abdominal  Soft,nontender, without masses, guarding or rebound.  Liver/spleen:  No organomegaly noted  Hernia:  None appreciated  Skin  Inspection:  Grossly normal  Palpation:  Grossly normal Neurologic/psychiatric  Orientation:  Normal with appropriate conversation.  Mood/affect:  Normal  Genitourinary    Breasts: Examined lying and sitting.     Right: Without masses, retractions, discharge or axillary adenopathy.     Left: Without masses, retractions, discharge or axillary adenopathy.   Inguinal/mons:  Normal without inguinal adenopathy  External genitalia:  Normal  BUS/Urethra/Skene's glands:  Normal  Bladder:  Normal  Vagina:  Normal  Cervix:  absent  Uterus: absent  Adnexa/parametria:     Rt: Without masses or tenderness.   Lt: Without masses or  tenderness.  Anus and perineum: Normal  Digital rectal exam: Normal sphincter tone without palpated masses or tenderness  Assessment/Plan:  67 y.o. [name] G3P3 for annual exam.    TVH for abnormal Paps in the 70s/estradiol  Hypertension/hypothyroid/anxiety/depression/COPD-primary care labs and meds Osteopenia T score -1.9 left femoral neck 12/2009 Smoker half pack per day  Plan: SBE's, continue annual mammogram, reviewed 3-D tomography due to breast density. Reviewed importance of leisure, calcium rich diet, vitamin D 2000 daily. Repeat DEXA this year, will schedule. Home safety, fall prevention and importance of regular exercise reviewed. Pneumovax vaccine at primary care recommended. Encouraged counseling due to to poor relationship with husband, denies abuse. Aware of hazards of smoking. Pap, reviewed if normal will stop due to 20 year history of normal Paps. Estradiol 2 mg by mouth daily prescription, proper use given and reviewed. Reviewed risks of blood clots, strokes and breast cancer, reviewed decreasing dose, less is best.    Harrington Challenger [facility], 9:49 AM 05/22/2012

## 2012-06-14 ENCOUNTER — Encounter: Payer: Self-pay | Admitting: Internal Medicine

## 2012-07-13 ENCOUNTER — Other Ambulatory Visit: Payer: Self-pay | Admitting: Internal Medicine

## 2012-07-20 ENCOUNTER — Other Ambulatory Visit: Payer: PRIVATE HEALTH INSURANCE | Admitting: Internal Medicine

## 2012-07-20 ENCOUNTER — Encounter: Payer: Self-pay | Admitting: Internal Medicine

## 2012-07-21 ENCOUNTER — Encounter: Payer: PRIVATE HEALTH INSURANCE | Admitting: Internal Medicine

## 2012-08-01 ENCOUNTER — Other Ambulatory Visit: Payer: Medicare Other | Admitting: Internal Medicine

## 2012-08-01 DIAGNOSIS — I10 Essential (primary) hypertension: Secondary | ICD-10-CM

## 2012-08-01 DIAGNOSIS — E039 Hypothyroidism, unspecified: Secondary | ICD-10-CM

## 2012-08-01 DIAGNOSIS — E785 Hyperlipidemia, unspecified: Secondary | ICD-10-CM

## 2012-08-01 DIAGNOSIS — Z79899 Other long term (current) drug therapy: Secondary | ICD-10-CM

## 2012-08-01 DIAGNOSIS — Z0000 Encounter for general adult medical examination without abnormal findings: Secondary | ICD-10-CM

## 2012-08-01 LAB — COMPREHENSIVE METABOLIC PANEL WITH GFR
ALT: 12 U/L (ref 0–35)
AST: 19 U/L (ref 0–37)
Albumin: 4 g/dL (ref 3.5–5.2)
Alkaline Phosphatase: 50 U/L (ref 39–117)
BUN: 9 mg/dL (ref 6–23)
CO2: 27 meq/L (ref 19–32)
Calcium: 9.3 mg/dL (ref 8.4–10.5)
Chloride: 101 meq/L (ref 96–112)
Creat: 0.86 mg/dL (ref 0.50–1.10)
Glucose, Bld: 80 mg/dL (ref 70–99)
Potassium: 4.2 meq/L (ref 3.5–5.3)
Sodium: 135 meq/L (ref 135–145)
Total Bilirubin: 0.6 mg/dL (ref 0.3–1.2)
Total Protein: 6.7 g/dL (ref 6.0–8.3)

## 2012-08-01 LAB — CBC WITH DIFFERENTIAL/PLATELET
Basophils Absolute: 0 10*3/uL (ref 0.0–0.1)
Basophils Relative: 0 % (ref 0–1)
Eosinophils Absolute: 0 10*3/uL (ref 0.0–0.7)
Eosinophils Relative: 0 % (ref 0–5)
HCT: 42 % (ref 36.0–46.0)
Hemoglobin: 14.8 g/dL (ref 12.0–15.0)
Lymphocytes Relative: 24 % (ref 12–46)
Lymphs Abs: 2.8 10*3/uL (ref 0.7–4.0)
MCH: 34.3 pg — ABNORMAL HIGH (ref 26.0–34.0)
MCHC: 35.2 g/dL (ref 30.0–36.0)
MCV: 97.4 fL (ref 78.0–100.0)
Monocytes Absolute: 0.6 10*3/uL (ref 0.1–1.0)
Monocytes Relative: 5 % (ref 3–12)
Neutro Abs: 8.1 10*3/uL — ABNORMAL HIGH (ref 1.7–7.7)
Neutrophils Relative %: 71 % (ref 43–77)
Platelets: 271 10*3/uL (ref 150–400)
RBC: 4.31 MIL/uL (ref 3.87–5.11)
RDW: 13.5 % (ref 11.5–15.5)
WBC: 11.5 10*3/uL — ABNORMAL HIGH (ref 4.0–10.5)

## 2012-08-01 LAB — LIPID PANEL
Cholesterol: 200 mg/dL (ref 0–200)
HDL: 57 mg/dL (ref >39)
LDL Cholesterol: 95 mg/dL (ref 0–99)
Total CHOL/HDL Ratio: 3.5 Ratio
Triglycerides: 241 mg/dL — ABNORMAL HIGH (ref <150)
VLDL: 48 mg/dL — ABNORMAL HIGH (ref 0–40)

## 2012-08-02 LAB — TSH: TSH: 5.188 u[IU]/mL — ABNORMAL HIGH (ref 0.350–4.500)

## 2012-08-02 LAB — VITAMIN D 25 HYDROXY (VIT D DEFICIENCY, FRACTURES): Vit D, 25-Hydroxy: 72 ng/mL (ref 30–89)

## 2012-08-07 ENCOUNTER — Encounter: Payer: Self-pay | Admitting: Internal Medicine

## 2012-08-07 ENCOUNTER — Ambulatory Visit (INDEPENDENT_AMBULATORY_CARE_PROVIDER_SITE_OTHER): Payer: Medicare Other | Admitting: Internal Medicine

## 2012-08-07 VITALS — BP 150/82 | HR 88 | Temp 98.2°F | Wt 132.0 lb

## 2012-08-07 DIAGNOSIS — Z0000 Encounter for general adult medical examination without abnormal findings: Secondary | ICD-10-CM

## 2012-08-07 DIAGNOSIS — Z23 Encounter for immunization: Secondary | ICD-10-CM

## 2012-08-07 DIAGNOSIS — F439 Reaction to severe stress, unspecified: Secondary | ICD-10-CM

## 2012-08-07 DIAGNOSIS — E039 Hypothyroidism, unspecified: Secondary | ICD-10-CM

## 2012-08-07 DIAGNOSIS — E781 Pure hyperglyceridemia: Secondary | ICD-10-CM

## 2012-08-07 DIAGNOSIS — Z733 Stress, not elsewhere classified: Secondary | ICD-10-CM

## 2012-08-07 DIAGNOSIS — I10 Essential (primary) hypertension: Secondary | ICD-10-CM

## 2012-08-07 DIAGNOSIS — G4700 Insomnia, unspecified: Secondary | ICD-10-CM

## 2012-08-07 DIAGNOSIS — F411 Generalized anxiety disorder: Secondary | ICD-10-CM

## 2012-08-07 MED ORDER — PNEUMOCOCCAL VAC POLYVALENT 25 MCG/0.5ML IJ INJ
0.5000 mL | INJECTION | Freq: Once | INTRAMUSCULAR | Status: AC
  Administered 2012-08-07: 0.5 mL via INTRAMUSCULAR

## 2012-08-07 MED ORDER — LEVOTHYROXINE SODIUM 125 MCG PO TABS: 125.0000 ug | ORAL_TABLET | Freq: Every day | ORAL | Status: DC

## 2012-08-07 MED ORDER — LISINOPRIL 10 MG PO TABS: 10.0000 mg | ORAL_TABLET | Freq: Every day | ORAL | Status: DC

## 2012-08-07 MED ORDER — TEMAZEPAM 15 MG PO CAPS: 15.0000 mg | ORAL_CAPSULE | Freq: Every evening | ORAL | Status: DC | PRN

## 2012-08-07 NOTE — Progress Notes (Signed)
Subjective:    Patient ID: Megan Williams, female    DOB: 1945/03/08, 67 y.o.   MRN: [id number]  HPI  67 year old White female for Welcome to [facility] Exam and evaluation of medical problems. Having insomnia. BP has been elevated. Restart Lisinopril 10 mg daily A lot of situational stress. Willing to take sleep meds but does not want Ambien. No change in family or social history. Situational stress with daughter and with husband. Saw GYN ([facility]) now seeing NP there  March 24th. Has had recent mammogram. Smoking 1/2- 3/4 ppd.   History of hyperlipidemia, hypothyroidism, anxiety depression, COPD, hypertension, allergic rhinitis, history of vitamin D deficiency.  Past medical history: History of L4-L5 decompression and stabilization by Dr. Danielle Dess October 2012. Colonoscopy done 2005 by Dr. Ewing Schlein. History of GE reflux. Has smoked for over 40 years. Tonsillectomy 1963, thyroidectomy 1968 on thyroid replacement therapy. Hysterectomy without oophorectomy 1979. Cholecystectomy 1993. 3 level cervical spine fusion C4-C7 2005 by Dr. Danielle Dess. Joint reconstruction right thumb 2008 by Dr. Teressa Senter.  Has been on SSRI medication for a number of years. Previously took Prozac under the care of Dr. Karma Ganja and Cymbalta under the care of Dr. Bradd Canary. Has tried Chantix for cigarette addiction but it causes bad dreams. Tried Wellbutrin but was unable to stop smoking.  Social history: Is married. Has 2 adult children. 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 She and her husband are retired. Says husband is not interested in sex and this has caused her a great deal of concern. Has not discussed it with him. Daughter with marital issues which are disturbing to patient.  Family history: Unknown since she is adopted    Review of Systems  HENT: Positive for rhinorrhea.        Sinus congestion  Eyes: Positive for visual disturbance.       Opthalmic migraine in early May- saw Dr. Sherryle Lis   Respiratory: Negative.        History of exacerbations of COPD with wheezing.  Cardiovascular: Negative.   Gastrointestinal: Negative.        History of GE reflux  Endocrine:       Hypothyroidism  Allergic/Immunologic: Positive for environmental allergies.       Pine allergy  Neurological:       Migraine x 4 days  Psychiatric/Behavioral:       Anxiety, insomnia, angry       Objective:   Physical Exam  Vitals reviewed. Constitutional: She is oriented to person, place, and time. She appears well-developed and well-nourished. No distress.  HENT:  Head: Normocephalic and atraumatic.  Right Ear: External ear normal.  Left Ear: External ear normal.  Mouth/Throat: Oropharynx is clear and moist.  Eyes: EOM are normal. Pupils are equal, round, and reactive to light. Right eye exhibits no discharge. Left eye exhibits no discharge. No scleral icterus.  Neck: Neck supple. No JVD present. No thyromegaly present.  Cardiovascular: Normal rate, regular rhythm, normal heart sounds and intact distal pulses.   No murmur heard. Pulmonary/Chest: Effort normal and breath sounds normal. No respiratory distress. She has no wheezes. She has no rales. She exhibits no tenderness.  Abdominal: Soft. Bowel sounds are normal. She exhibits no distension and no mass. There is no tenderness. There is no rebound and no guarding.  Genitourinary:  Deferred to GYN physician  Musculoskeletal: Normal range of motion. She exhibits no edema.  Lymphadenopathy:    She has no cervical adenopathy.  Neurological: She is alert and oriented to person, place, and time. She has normal reflexes. No cranial nerve deficit.  Skin: Skin is warm and dry. No rash noted. She is not diaphoretic.  Psychiatric: Her behavior is normal. Judgment and thought content normal.  Flat affect          Assessment & Plan:   Situational Stress  HTN-return for followup in July. Blood pressure elevated today.  Hypertriglyceridemia-watch  diet  Insomnia- goes today at 1am and up at 6am. Cannot fall asleep.  Depression  Hypothyroidism status post thyroidectomy. TSH is elevated. Could be an error. Return in 6 weeks for repeat TSH  Plan: Return in 6 weeks for TSH. Recommend Ollen Gross for counseling.    Subjective:   Patient presents for Medicare Annual/Subsequent preventive examination.   Review Past Medical/Family/Social: No changes   Risk Factors  Current exercise habits: walk 3 [place] a day Dietary issues discussed: low fat low carb  Cardiac risk factors: Hypertriglyceridemia; HTN; hyperlipidemia  Depression Screen  (Note: if answer to either of the following is "Yes", a more complete depression screening is indicated)   Over the past two weeks, have you felt down, depressed or hopeless? No  Over the past two weeks, have you felt little interest or pleasure in doing things? No Have you lost interest or pleasure in daily life? No Do you often feel hopeless? No Do you cry easily over simple problems? No   Activities of Daily Living  In your present state of health, do you have any difficulty performing the following activities?:   Driving? No  Managing money? No  Feeding yourself? No  Getting from bed to chair? No  Climbing a flight of stairs? No  Preparing food and eating?: No  Bathing or showering? No  Getting dressed: No  Getting to the toilet? No  Using the toilet:No  Moving around from place to place: No  In the past year have you fallen or had a near fall?:No  Are you sexually active? No  Do you have more than one partner? No   Hearing Difficulties: No  Do you often ask people to speak up or repeat themselves? No  Do you experience ringing or noises in your ears? No  Do you have difficulty understanding soft or whispered voices? No  Do you feel that you have a problem with memory? No Do you often misplace items? No    Home Safety:  Do you have a smoke alarm at your residence? Yes Do  you have grab bars in the bathroom? no Do you have throw rugs in your house? no   Cognitive Testing  Alert? Yes Normal Appearance?Yes  Oriented to person? Yes Place? Yes  Time? Yes  Recall of three objects? Yes  Can perform simple calculations? Yes  Displays appropriate judgment?Yes  Can read the correct time from a watch face?Yes   List the Names of Other Physician/Practitioners you currently use:  See referral list for the physicians patient is currently seeing.  Maryelizabeth Rowan- NP with GYN   Review of Systems: See above   Objective:     General appearance: Appears stated. Head: Normocephalic, without obvious abnormality, atraumatic  Eyes: conj clear, EOMi PEERLA  Ears: normal TM's and external ear canals both ears  Nose: Nares normal. Septum midline. Mucosa normal. No drainage or sinus tenderness.  Throat: lips, mucosa, and tongue normal; teeth and gums normal  Neck: no adenopathy, no carotid bruit, no JVD, supple, symmetrical, trachea midline  and thyroid not enlarged, symmetric, no tenderness/mass/nodules  No CVA tenderness.  Lungs: clear to auscultation bilaterally  Breasts: normal appearance, no masses or tenderness. Heart: regular rate and rhythm, S1, S2 normal, no murmur, click, rub or gallop  Abdomen: soft, non-tender; bowel sounds normal; no masses, no organomegaly  Musculoskeletal: ROM normal in all joints, no crepitus, no deformity, Normal muscle strengthen. Back  is symmetric, no curvature. Skin: Skin color, texture, turgor normal. No rashes or lesions  Lymph nodes: Cervical, supraclavicular, and axillary nodes normal.  Neurologic: CN 2 -12 Normal, Normal symmetric reflexes. Normal coordination and gait  Psych: Alert & Oriented x 3, Mood appear stable.    Assessment:    Annual wellness medicare exam   Plan:    During the course of the visit the patient was educated and counseled about appropriate screening and preventive services including:   Annual flu  vaccine Annual mammogram     Patient Instructions (the written plan) was given to the patient.  Medicare Attestation  I have personally reviewed:  The patient's medical and social history  Their use of alcohol, tobacco or illicit drugs  Their current medications and supplements  The patient's functional ability including ADLs,fall risks, home safety risks, cognitive, and hearing and visual impairment  Diet and physical activities  Evidence for depression or mood disorders  The patient's weight, height, BMI, and visual acuity have been recorded in the chart. I have made referrals, counseling, and provided education to the patient based on review of the above and I have provided the patient with a written personalized care plan for preventive services.

## 2012-08-07 NOTE — Patient Instructions (Addendum)
Call Ollen Gross for appt. Recheck thyroid in 6 weeks. Try Dalmane for sleep.

## 2012-08-08 ENCOUNTER — Telehealth: Payer: Self-pay | Admitting: *Deleted

## 2012-08-08 NOTE — Telephone Encounter (Signed)
Spoke to pharmacist Casimiro Needle and authorized per Dr. Lenord Fellers the follow script: Temazepam 15 mg # 30 1 at bedtime 1 refill. KW

## 2012-09-02 ENCOUNTER — Encounter: Payer: Self-pay | Admitting: Internal Medicine

## 2012-09-02 ENCOUNTER — Encounter: Payer: Self-pay | Admitting: Women's Health

## 2012-09-04 ENCOUNTER — Other Ambulatory Visit: Payer: Self-pay

## 2012-09-04 MED ORDER — TEMAZEPAM 30 MG PO CAPS: 30.0000 mg | ORAL_CAPSULE | Freq: Every evening | ORAL | Status: DC | PRN

## 2012-09-05 ENCOUNTER — Telehealth: Payer: Self-pay | Admitting: *Deleted

## 2012-09-05 DIAGNOSIS — Z79890 Hormone replacement therapy (postmenopausal): Secondary | ICD-10-CM

## 2012-09-05 MED ORDER — ESTRADIOL 2 MG PO TABS: 2.0000 mg | ORAL_TABLET | Freq: Every day | ORAL | Status: DC

## 2012-09-05 NOTE — Telephone Encounter (Signed)
I spoke with patient about her emails and the protocol of the prior authorization approvals. A prescription is needed at the pharmacy to initiate the prior authorization. Rx called in to [facility]. Pt will only be contacted back if problem with PA. Pt understood KW

## 2012-09-18 ENCOUNTER — Other Ambulatory Visit: Payer: Medicare Other | Admitting: Internal Medicine

## 2012-09-18 DIAGNOSIS — I10 Essential (primary) hypertension: Secondary | ICD-10-CM

## 2012-09-18 DIAGNOSIS — E039 Hypothyroidism, unspecified: Secondary | ICD-10-CM

## 2012-09-18 LAB — BASIC METABOLIC PANEL
BUN: 11 mg/dL (ref 6–23)
CO2: 26 mEq/L (ref 19–32)
Calcium: 9.1 mg/dL (ref 8.4–10.5)
Chloride: 103 mEq/L (ref 96–112)
Creat: 0.7 mg/dL (ref 0.50–1.10)
Glucose, Bld: 96 mg/dL (ref 70–99)
Potassium: 4.2 mEq/L (ref 3.5–5.3)
Sodium: 137 mEq/L (ref 135–145)

## 2012-09-18 LAB — TSH: TSH: 0.169 u[IU]/mL — ABNORMAL LOW (ref 0.350–4.500)

## 2012-09-19 ENCOUNTER — Ambulatory Visit (INDEPENDENT_AMBULATORY_CARE_PROVIDER_SITE_OTHER): Payer: Medicare Other | Admitting: Internal Medicine

## 2012-09-19 ENCOUNTER — Encounter: Payer: Self-pay | Admitting: Internal Medicine

## 2012-09-19 VITALS — BP 118/74 | HR 92 | Temp 98.1°F | Wt 133.0 lb

## 2012-09-19 DIAGNOSIS — I10 Essential (primary) hypertension: Secondary | ICD-10-CM

## 2012-09-19 DIAGNOSIS — E039 Hypothyroidism, unspecified: Secondary | ICD-10-CM

## 2012-09-19 DIAGNOSIS — F329 Major depressive disorder, single episode, unspecified: Secondary | ICD-10-CM

## 2012-09-19 DIAGNOSIS — F32A Depression, unspecified: Secondary | ICD-10-CM

## 2012-09-19 DIAGNOSIS — G4700 Insomnia, unspecified: Secondary | ICD-10-CM

## 2012-09-19 DIAGNOSIS — F341 Dysthymic disorder: Secondary | ICD-10-CM

## 2012-09-19 DIAGNOSIS — E781 Pure hyperglyceridemia: Secondary | ICD-10-CM

## 2012-09-19 DIAGNOSIS — Z87891 Personal history of nicotine dependence: Secondary | ICD-10-CM

## 2012-09-19 MED ORDER — NYSTATIN-TRIAMCINOLONE 100000-0.1 UNIT/GM-% EX OINT: TOPICAL_OINTMENT | Freq: Two times a day (BID) | CUTANEOUS | Status: DC

## 2012-09-19 MED ORDER — ALPRAZOLAM 0.5 MG PO TABS: 0.5000 mg | ORAL_TABLET | Freq: Three times a day (TID) | ORAL | Status: DC | PRN

## 2012-10-13 ENCOUNTER — Telehealth: Payer: Self-pay | Admitting: Internal Medicine

## 2012-10-13 MED ORDER — VALACYCLOVIR HCL 500 MG PO TABS: 500.0000 mg | ORAL_TABLET | Freq: Two times a day (BID) | ORAL | Status: DC

## 2012-11-27 ENCOUNTER — Telehealth: Payer: Self-pay | Admitting: Internal Medicine

## 2012-11-27 NOTE — Telephone Encounter (Signed)
Refer her to a Insurance underwriter in [place].  She doesn't want to go back to [place].  And, she feels she needs to be put on some sort of medicine for this problem as it has gotten much worse.  Please advise.

## 2012-11-27 NOTE — Telephone Encounter (Signed)
Patient informed. States she knows someone there and will call and scheduled herself an appointment.

## 2012-11-27 NOTE — Telephone Encounter (Signed)
I do not know any urologists in [place].  She should inquire as to where else she would like to be referred to. Consider [place] or [facility]

## 2012-11-29 ENCOUNTER — Other Ambulatory Visit: Payer: Self-pay | Admitting: Internal Medicine

## 2012-11-29 MED ORDER — LEVOTHYROXINE SODIUM 125 MCG PO TABS: 125.0000 ug | ORAL_TABLET | Freq: Every day | ORAL | Status: DC

## 2012-12-22 ENCOUNTER — Other Ambulatory Visit (INDEPENDENT_AMBULATORY_CARE_PROVIDER_SITE_OTHER): Payer: Medicare Other | Admitting: Internal Medicine

## 2012-12-22 DIAGNOSIS — Z23 Encounter for immunization: Secondary | ICD-10-CM

## 2012-12-22 DIAGNOSIS — E039 Hypothyroidism, unspecified: Secondary | ICD-10-CM

## 2012-12-22 LAB — TSH: TSH: 0.638 u[IU]/mL (ref 0.350–4.500)

## 2013-01-28 ENCOUNTER — Encounter: Payer: Self-pay | Admitting: Internal Medicine

## 2013-01-28 NOTE — Progress Notes (Signed)
   Subjective:    Patient ID: Megan Williams, female    DOB: [birth year], 67 y.o.   MRN: [id number]  HPI Was seen in June  For Welcome to Medicare physical examination. At that time had significant stress with husband and daughter. Counseling was recommended. Blood pressure was elevated at that time. TSH was elevated and it was recommended to her that we repeat TSH 6 weeks after that visit to cause it could of been an error. She is on thyroid replacement therapy. She is taking lisinopril for hypertension. Currently taking one half of a 10 mg tablet daily. Blood pressure is quite acceptable. She has developed cheilosis. Mycolog cream prescribed today. Xanax refilled. Continues to smoke. Much calmer today. Restoril prescribed for insomnia.    Review of Systems     Objective:   Physical Exam neck is supple without JVD thyromegaly or carotid bruits. Cheilosis noted corner of mouth. Chest clear to auscultation. Cardiac exam regular rate and rhythm. Extremities without edema.        Assessment & Plan:  Hypertension-stable on low-dose low sun up real. Basic metabolic panel drawn.  Hypothyroidism-at last visit TSH was elevated. Is to repeat TSH in the near future.  Anxiety  History of hypertriglyceridemia  Depression  Insomnia  GE reflux  History smoking-not able to quit  Cheilosis-Mycolog cream prescribed  Plan: Return in 6 months

## 2013-01-28 NOTE — Patient Instructions (Signed)
Continue same medications and return in 6 months 

## 2013-01-30 ENCOUNTER — Other Ambulatory Visit: Payer: Self-pay | Admitting: Women's Health

## 2013-01-30 DIAGNOSIS — Z1231 Encounter for screening mammogram for malignant neoplasm of breast: Secondary | ICD-10-CM

## 2013-01-31 ENCOUNTER — Encounter: Payer: Self-pay | Admitting: Internal Medicine

## 2013-02-09 ENCOUNTER — Telehealth: Payer: Self-pay | Admitting: Internal Medicine

## 2013-02-09 NOTE — Telephone Encounter (Signed)
Patient called saying that she is seeing urologist for over active bladder and he had increased Toviaz  from 4-8 mg. She's complaining of severe leg cramps going into her hip, complains of weight gain, complains of lower abdominal distention.  Has appointment to be seen here in late January for followup of hypothyroidism. We offered her a sooner appointment in 2 weeks and she wants to wait until end of January for her appointment. We suggested perhaps she stop Toviaz but she has been told this is not a side effect of that medication. We also recently got information from her counselor, Ollen Gross, that she has been suicidal.

## 2013-03-05 ENCOUNTER — Ambulatory Visit (HOSPITAL_COMMUNITY)
Admission: RE | Admit: 2013-03-05 | Discharge: 2013-03-05 | Disposition: A | Discharge status: Home / Self Care | Payer: Medicare HMO | Source: Ambulatory Visit | Attending: Women's Health | Admitting: Women's Health

## 2013-03-05 DIAGNOSIS — Z1231 Encounter for screening mammogram for malignant neoplasm of breast: Secondary | ICD-10-CM | POA: Insufficient documentation

## 2013-03-21 ENCOUNTER — Telehealth: Payer: Self-pay | Admitting: *Deleted

## 2013-03-21 NOTE — Telephone Encounter (Signed)
Pt called stating her medication for estrace 2 mg daily requires PA. I called [employer] @ [phone] got approval for this effective 03/21/13-02/28/14. Pt informed, pharmacy as well.

## 2013-03-29 ENCOUNTER — Other Ambulatory Visit: Payer: Medicare HMO | Admitting: Internal Medicine

## 2013-03-29 ENCOUNTER — Other Ambulatory Visit: Payer: Self-pay | Admitting: Internal Medicine

## 2013-03-29 DIAGNOSIS — E039 Hypothyroidism, unspecified: Secondary | ICD-10-CM

## 2013-03-29 LAB — TSH: TSH: 0.722 u[IU]/mL (ref 0.350–4.500)

## 2013-03-30 ENCOUNTER — Ambulatory Visit (INDEPENDENT_AMBULATORY_CARE_PROVIDER_SITE_OTHER): Payer: Medicare HMO | Admitting: Internal Medicine

## 2013-03-30 ENCOUNTER — Encounter: Payer: Self-pay | Admitting: Internal Medicine

## 2013-03-30 VITALS — BP 104/72 | HR 80 | Temp 99.0°F | Wt 136.0 lb

## 2013-03-30 DIAGNOSIS — G4700 Insomnia, unspecified: Secondary | ICD-10-CM

## 2013-03-30 DIAGNOSIS — F439 Reaction to severe stress, unspecified: Secondary | ICD-10-CM

## 2013-03-30 DIAGNOSIS — Z733 Stress, not elsewhere classified: Secondary | ICD-10-CM

## 2013-03-30 DIAGNOSIS — F411 Generalized anxiety disorder: Secondary | ICD-10-CM

## 2013-03-30 DIAGNOSIS — I10 Essential (primary) hypertension: Secondary | ICD-10-CM

## 2013-03-30 DIAGNOSIS — E039 Hypothyroidism, unspecified: Secondary | ICD-10-CM

## 2013-03-30 DIAGNOSIS — Z87891 Personal history of nicotine dependence: Secondary | ICD-10-CM

## 2013-03-30 LAB — VITAMIN B12: Vitamin B-12: 258 pg/mL (ref 211–911)

## 2013-03-30 LAB — MAGNESIUM: Magnesium: 1.4 mg/dL — ABNORMAL LOW (ref 1.5–2.5)

## 2013-03-30 MED ORDER — LISINOPRIL 5 MG PO TABS: 5.0000 mg | ORAL_TABLET | Freq: Every day | ORAL | Status: DC

## 2013-03-30 MED ORDER — LEVOTHYROXINE SODIUM 125 MCG PO TABS: 125.0000 ug | ORAL_TABLET | Freq: Every day | ORAL | Status: DC

## 2013-03-30 NOTE — Progress Notes (Signed)
   Subjective:    Patient ID: Megan Williams, female    DOB: 1945-11-27, 68 y.o.   MRN: [id number]  HPI For 6 month recheck. TSH is normal on thyroid replacement therapy. Has been having some nocturnal leg cramps. Recommend magnesium supplement his magnesium is slightly low at 1.4. Blood pressure is excellent on lisinopril. Currently on Effexor and trazodone. Patient insists that Vesicare was causing some leg cramps and wants it she stopped that medication leg cramps resolved. Some situational stress at home with brother living with her. Continues to smoke a pack of cigarettes daily. Once again smoking cessation counseling given.    Review of Systems     Objective:   Physical Exam No thyromegaly. Chest clear to auscultation. Cardiac exam regular rate and rhythm. Extremities without edema.       Assessment & Plan:  Hypothyroidism-stable on thyroid replacement therapy  Leg cramps-recommend over-the-counter magnesium supplement although patient says leg cramps have improved since stopping Vesicare  History depression and anxiety-currently on Effexor  and trazodone. Has Xanax for anxiety.  Hypertension-stable on lisinopril. Blood pressure is actually low so going to try to reduce dose to 5 mg from 10 mg daily  History of smoking  History of insomnia  COPD-once again counseled regarding smoking cessation  Plan: Return in 6 months for physical examination

## 2013-03-30 NOTE — Patient Instructions (Signed)
Reduce Lisinopril to 5 mg daily. TSH normal. Return in 6 months for CPE

## 2013-05-29 ENCOUNTER — Encounter: Payer: Self-pay | Admitting: Women's Health

## 2013-05-29 ENCOUNTER — Ambulatory Visit (INDEPENDENT_AMBULATORY_CARE_PROVIDER_SITE_OTHER): Payer: Medicare HMO | Admitting: Women's Health

## 2013-05-29 VITALS — BP 128/80 | Ht 63.0 in | Wt 130.6 lb

## 2013-05-29 DIAGNOSIS — Z79890 Hormone replacement therapy (postmenopausal): Secondary | ICD-10-CM

## 2013-05-29 MED ORDER — ESTRADIOL 2 MG PO TABS: 2.0000 mg | ORAL_TABLET | Freq: Every day | ORAL | Status: DC

## 2013-05-29 NOTE — Progress Notes (Signed)
Megan Williams 1945-12-25 [id number]    History:    Presents for breast and pelvic exam. TVHfor abnormal Paps in 1970 with normal Paps after. Estradiol 2 mg daily. Smokes half to one pack daily. 2012 osteopenia T score -1.9 rectal right femoral neck FRAX10%/2.1%. Normal mammograms. Hypertension/hypothyroid/anxiety/depression/COPD-primary care manages. Current on vaccinations. Negative colonoscopy 2013.  Past medical history, past surgical history, family history and social history were all reviewed and documented in the EPIC chart. Retired Glass blower/designer. 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  Exam:  Filed Vitals:   05/29/13 0934  BP: 128/80    General appearance:  Normal Thyroid:  Symmetrical, normal in size, without palpable masses or nodularity. Respiratory  Auscultation:  Clear without wheezing or rhonchi Cardiovascular  Auscultation:  Regular rate, without rubs, murmurs or gallops  Edema/varicosities:  Not grossly evident Abdominal  Soft,nontender, without masses, guarding or rebound.  Liver/spleen:  No organomegaly noted  Hernia:  None appreciated  Skin  Inspection:  Grossly normal   Breasts: Examined lying and sitting.     Right: Without masses, retractions, discharge or axillary adenopathy.     Left: Without masses, retractions, discharge or axillary adenopathy. Gentitourinary   Inguinal/mons:  Normal without inguinal adenopathy  External genitalia:  Normal  BUS/Urethra/Skene's glands:  Normal  Vagina:  Normal  Cervix:  Absent  Uterus:  Absent  Adnexa/parametria:     Rt: Without masses or tenderness.   Lt: Without masses or tenderness.  Anus and perineum: Normal  Digital rectal exam: Normal sphincter tone without palpated masses or tenderness  Assessment/Plan:  67 y.o.[name] G3P3  for breast and pelvic exam with no complaints.  Postmenopausal on estradiol Smoker Osteopenia Hypertension/hypothyroidism/anxiety and depression/COPD-primary care manages labs and  meds  Plan: HRT reviewed, states has numerous hot flushes and does not feel well when off, reviewed risks of blood clots, strokes, breast cancer, estradiol 2 mg by mouth daily prescription, proper use given and reviewed. Repeat bone density. Reviewed importance of increased regular weightbearing exercise, calcium rich diet, vitamin D 2000 daily, home safety and fall prevention discussed. SBE's, continue annual mammogram, 3-D tomography reviewed and encouraged history of dense breast. Aware of importance of decreasing/quitting smoking.   Huel Cote WHNP, 1:28 PM 05/29/2013

## 2013-05-29 NOTE — Patient Instructions (Signed)
Health Recommendations for Postmenopausal Women Respected and ongoing research has looked at the most common causes of death, disability, and poor quality of life in postmenopausal women. The causes include heart disease, diseases of blood vessels, diabetes, depression, cancer, and bone loss (osteoporosis). Many things can be done to help lower the chances of developing these and other common problems: CARDIOVASCULAR DISEASE Heart Disease: A heart attack is a medical emergency. Know the signs and symptoms of a heart attack. Below are things women can do to reduce their risk for heart disease.   Do not smoke. If you smoke, quit.  Aim for a healthy weight. Being overweight causes many preventable deaths. Eat a healthy and balanced diet and drink an adequate amount of liquids.  Get moving. Make a commitment to be more physically active. Aim for 30 minutes of activity on most, if not all days of the week.  Eat for heart health. Choose a diet that is low in saturated fat and cholesterol and eliminate trans fat. Include whole grains, vegetables, and fruits. Read and understand the labels on food containers before buying.  Know your numbers. Ask your caregiver to check your blood pressure, cholesterol (total, HDL, LDL, triglycerides) and blood glucose. Work with your caregiver on improving your entire clinical picture.  High blood pressure. Limit or stop your table salt intake (try salt substitute and food seasonings). Avoid salty foods and drinks. Read labels on food containers before buying. Eating well and exercising can help control high blood pressure. STROKE  Stroke is a medical emergency. Stroke may be the result of a blood clot in a blood vessel in the brain or by a brain hemorrhage (bleeding). Know the signs and symptoms of a stroke. To lower the risk of developing a stroke:  Avoid fatty foods.  Quit smoking.  Control your diabetes, blood pressure, and irregular heart rate. THROMBOPHLEBITIS  (BLOOD CLOT) OF THE LEG  Becoming overweight and leading a stationary lifestyle may also contribute to developing blood clots. Controlling your diet and exercising will help lower the risk of developing blood clots. CANCER SCREENING  Breast Cancer: Take steps to reduce your risk of breast cancer.  You should practice "breast self-awareness." This means understanding the normal appearance and feel of your breasts and should include breast self-examination. Any changes detected, no matter how small, should be reported to your caregiver.  After age 40, you should have a clinical breast exam (CBE) every year.  Starting at age 40, you should consider having a mammogram (breast X-ray) every year.  If you have a family history of breast cancer, talk to your caregiver about genetic screening.  If you are at high risk for breast cancer, talk to your caregiver about having an MRI and a mammogram every year.  Intestinal or Stomach Cancer: Tests to consider are a rectal exam, fecal occult blood, sigmoidoscopy, and colonoscopy. Women who are high risk may need to be screened at an earlier age and more often.  Cervical Cancer:  Beginning at age 30, you should have a Pap test every 3 years as long as the past 3 Pap tests have been normal.  If you have had past treatment for cervical cancer or a condition that could lead to cancer, you need Pap tests and screening for cancer for at least 20 years after your treatment.  If you had a hysterectomy for a problem that was not cancer or a condition that could lead to cancer, then you no longer need Pap tests.    If you are between ages 65 and 70, and you have had normal Pap tests going back 10 years, you no longer need Pap tests.  If Pap tests have been discontinued, risk factors (such as a new sexual partner) need to be reassessed to determine if screening should be resumed.  Some medical problems can increase the chance of getting cervical cancer. In these  cases, your caregiver may recommend more frequent screening and Pap tests.  Uterine Cancer: If you have vaginal bleeding after reaching menopause, you should notify your caregiver.  Ovarian cancer: Other than yearly pelvic exams, there are no reliable tests available to screen for ovarian cancer at this time except for yearly pelvic exams.  Lung Cancer: Yearly chest X-rays can detect lung cancer and should be done on high risk women, such as cigarette smokers and women with chronic lung disease (emphysema).  Skin Cancer: A complete body skin exam should be done at your yearly examination. Avoid overexposure to the sun and ultraviolet light lamps. Use a strong sun block cream when in the sun. All of these things are important in lowering the risk of skin cancer. MENOPAUSE Menopause Symptoms: Hormone therapy products are effective for treating symptoms associated with menopause:  Moderate to severe hot flashes.  Night sweats.  Mood swings.  Headaches.  Tiredness.  Loss of sex drive.  Insomnia.  Other symptoms. Hormone replacement carries certain risks, especially in older women. Women who use or are thinking about using estrogen or estrogen with progestin treatments should discuss that with their caregiver. Your caregiver will help you understand the benefits and risks. The ideal dose of hormone replacement therapy is not known. The Food and Drug Administration (FDA) has concluded that hormone therapy should be used only at the lowest doses and for the shortest amount of time to reach treatment goals.  OSTEOPOROSIS Protecting Against Bone Loss and Preventing Fracture: If you use hormone therapy for prevention of bone loss (osteoporosis), the risks for bone loss must outweigh the risk of the therapy. Ask your caregiver about other medications known to be safe and effective for preventing bone loss and fractures. To guard against bone loss or fractures, the following is recommended:  If  you are less than age 50, take 1000 mg of calcium and at least 600 mg of Vitamin D per day.  If you are greater than age 50 but less than age 70, take 1200 mg of calcium and at least 600 mg of Vitamin D per day.  If you are greater than age 70, take 1200 mg of calcium and at least 800 mg of Vitamin D per day. Smoking and excessive alcohol intake increases the risk of osteoporosis. Eat foods rich in calcium and vitamin D and do weight bearing exercises several times a week as your caregiver suggests. DIABETES Diabetes Melitus: If you have Type I or Type 2 diabetes, you should keep your blood sugar under control with diet, exercise and recommended medication. Avoid too many sweets, starchy and fatty foods. Being overweight can make control more difficult. COGNITION AND MEMORY Cognition and Memory: Menopausal hormone therapy is not recommended for the prevention of cognitive disorders such as Alzheimer's disease or memory loss.  DEPRESSION  Depression may occur at any age, but is common in elderly women. The reasons may be because of physical, medical, social (loneliness), or financial problems and needs. If you are experiencing depression because of medical problems and control of symptoms, talk to your caregiver about this. Physical activity and   exercise may help with mood and sleep. Community and volunteer involvement may help your sense of value and worth. If you have depression and you feel that the problem is getting worse or becoming severe, talk to your caregiver about treatment options that are best for you. ACCIDENTS  Accidents are common and can be serious in the elderly woman. Prepare your house to prevent accidents. Eliminate throw rugs, place hand bars in the bath, shower and toilet areas. Avoid wearing high heeled shoes or walking on wet, snowy, and icy areas. Limit or stop driving if you have vision or hearing problems, or you feel you are unsteady with you movements and  reflexes. HEPATITIS C Hepatitis C is a type of viral infection affecting the liver. It is spread mainly through contact with blood from an infected person. It can be treated, but if left untreated, it can lead to severe liver damage over years. Many people who are infected do not know that the virus is in their blood. If you are a "baby-boomer", it is recommended that you have one screening test for Hepatitis C. IMMUNIZATIONS  Several immunizations are important to consider having during your senior years, including:   Tetanus, diptheria, and pertussis booster shot.  Influenza every year before the flu season begins.  Pneumonia vaccine.  Shingles vaccine.  Others as indicated based on your specific needs. Talk to your caregiver about these. Document Released: 04/09/2005 Document Revised: 02/02/2012 Document Reviewed: 12/04/2007 [employer] Patient Information 2014 [employer].  

## 2013-06-07 ENCOUNTER — Encounter: Payer: Self-pay | Admitting: Internal Medicine

## 2013-06-07 ENCOUNTER — Ambulatory Visit (INDEPENDENT_AMBULATORY_CARE_PROVIDER_SITE_OTHER): Payer: Medicare HMO | Admitting: Internal Medicine

## 2013-06-07 VITALS — BP 96/60 | HR 80 | Temp 98.7°F | Wt 128.0 lb

## 2013-06-07 DIAGNOSIS — J209 Acute bronchitis, unspecified: Secondary | ICD-10-CM

## 2013-06-07 DIAGNOSIS — F411 Generalized anxiety disorder: Secondary | ICD-10-CM

## 2013-06-07 DIAGNOSIS — Z8709 Personal history of other diseases of the respiratory system: Secondary | ICD-10-CM

## 2013-06-07 MED ORDER — ALBUTEROL SULFATE HFA 108 (90 BASE) MCG/ACT IN AERS: 2.0000 | INHALATION_SPRAY | Freq: Four times a day (QID) | RESPIRATORY_TRACT | Status: DC | PRN

## 2013-06-07 MED ORDER — LEVOFLOXACIN 500 MG PO TABS
500.0000 mg | ORAL_TABLET | Freq: Every day | ORAL | Status: DC
Start: 2013-06-07 — End: 2013-09-20

## 2013-06-07 MED ORDER — HYDROCODONE-HOMATROPINE 5-1.5 MG/5ML PO SYRP: 5.0000 mL | ORAL_SOLUTION | Freq: Three times a day (TID) | ORAL | Status: DC | PRN

## 2013-06-07 MED ORDER — METHYLPREDNISOLONE ACETATE 80 MG/ML IJ SUSP
80.0000 mg | Freq: Once | INTRAMUSCULAR | Status: AC
  Administered 2013-06-07: 80 mg via INTRAMUSCULAR

## 2013-06-07 NOTE — Progress Notes (Signed)
   Subjective:    Patient ID: Megan Williams, female    DOB: 1945/08/18, 68 y.o.   MRN: [id number]  HPI  URI symptoms for a couple of weeks. Cough with discolored sputum production. No wheezing. Still stressed  because brother is living with patient and her husband. She will not ask him to leave.    Review of Systems     Objective:   Physical Exam Skin warm and dry. Nodes none. HEENT exam: TMs slightly full. Pharynx clear. Neck supple. Chest clear to auscultation. Affect within normal limits. Extremities without edema. Cardiac exam regular rate and rhythm.        Assessment & Plan:  Acute bronchitis  Situational stress  History of hypothyroidism on thyroid replacement therapy  History of hypertension-blood pressure normal  25 minutes spent with patient  Plan: Levaquin 500 milligrams daily for 7 days. Depo-Medrol 80 mg IM. Hycodan 8 ounces 1 teaspoon by mouth every 8 hours when necessary cough.

## 2013-06-07 NOTE — Patient Instructions (Signed)
Take Levaquin 500 mg daily x 10 days. Depomedrol 80 mg IM given. Hycodan for cough.

## 2013-08-03 ENCOUNTER — Other Ambulatory Visit: Payer: Self-pay | Admitting: Women's Health

## 2013-09-18 ENCOUNTER — Other Ambulatory Visit: Payer: Medicare HMO | Admitting: Internal Medicine

## 2013-09-18 DIAGNOSIS — Z130 Encounter for screening for diseases of the blood and blood-forming organs and certain disorders involving the immune mechanism: Secondary | ICD-10-CM

## 2013-09-18 DIAGNOSIS — E039 Hypothyroidism, unspecified: Secondary | ICD-10-CM

## 2013-09-18 DIAGNOSIS — E785 Hyperlipidemia, unspecified: Secondary | ICD-10-CM

## 2013-09-18 DIAGNOSIS — Z79899 Other long term (current) drug therapy: Secondary | ICD-10-CM

## 2013-09-18 LAB — CBC WITH DIFFERENTIAL/PLATELET
Basophils Absolute: 0 10*3/uL (ref 0.0–0.1)
Basophils Relative: 0 % (ref 0–1)
Eosinophils Absolute: 0 10*3/uL (ref 0.0–0.7)
Eosinophils Relative: 0 % (ref 0–5)
HCT: 40.7 % (ref 36.0–46.0)
Hemoglobin: 13.8 g/dL (ref 12.0–15.0)
Lymphocytes Relative: 27 % (ref 12–46)
Lymphs Abs: 2.9 10*3/uL (ref 0.7–4.0)
MCH: 33.7 pg (ref 26.0–34.0)
MCHC: 33.9 g/dL (ref 30.0–36.0)
MCV: 99.3 fL (ref 78.0–100.0)
Monocytes Absolute: 0.4 10*3/uL (ref 0.1–1.0)
Monocytes Relative: 4 % (ref 3–12)
Neutro Abs: 7.3 10*3/uL (ref 1.7–7.7)
Neutrophils Relative %: 69 % (ref 43–77)
Platelets: 246 10*3/uL (ref 150–400)
RBC: 4.1 MIL/uL (ref 3.87–5.11)
RDW: 14.2 % (ref 11.5–15.5)
WBC: 10.6 10*3/uL — ABNORMAL HIGH (ref 4.0–10.5)

## 2013-09-18 LAB — COMPREHENSIVE METABOLIC PANEL
ALT: 17 U/L (ref 0–35)
AST: 23 U/L (ref 0–37)
Albumin: 4 g/dL (ref 3.5–5.2)
Alkaline Phosphatase: 44 U/L (ref 39–117)
BUN: 11 mg/dL (ref 6–23)
CO2: 27 mEq/L (ref 19–32)
Calcium: 9.2 mg/dL (ref 8.4–10.5)
Chloride: 101 mEq/L (ref 96–112)
Creat: 0.86 mg/dL (ref 0.50–1.10)
Glucose, Bld: 83 mg/dL (ref 70–99)
Potassium: 4.2 mEq/L (ref 3.5–5.3)
Sodium: 136 mEq/L (ref 135–145)
Total Bilirubin: 0.7 mg/dL (ref 0.2–1.2)
Total Protein: 6.4 g/dL (ref 6.0–8.3)

## 2013-09-18 LAB — LIPID PANEL
Cholesterol: 129 mg/dL (ref 0–200)
HDL: 61 mg/dL (ref >39)
LDL Cholesterol: 29 mg/dL (ref 0–99)
Total CHOL/HDL Ratio: 2.1 Ratio
Triglycerides: 197 mg/dL — ABNORMAL HIGH (ref <150)
VLDL: 39 mg/dL (ref 0–40)

## 2013-09-19 LAB — TSH: TSH: 0.312 u[IU]/mL — ABNORMAL LOW (ref 0.350–4.500)

## 2013-09-20 ENCOUNTER — Ambulatory Visit (INDEPENDENT_AMBULATORY_CARE_PROVIDER_SITE_OTHER): Payer: Medicare HMO | Admitting: Internal Medicine

## 2013-09-20 ENCOUNTER — Encounter: Payer: Self-pay | Admitting: Internal Medicine

## 2013-09-20 VITALS — BP 116/72 | HR 80 | Ht 62.5 in | Wt 118.5 lb

## 2013-09-20 DIAGNOSIS — Z87891 Personal history of nicotine dependence: Secondary | ICD-10-CM

## 2013-09-20 DIAGNOSIS — F329 Major depressive disorder, single episode, unspecified: Secondary | ICD-10-CM

## 2013-09-20 DIAGNOSIS — Z0000 Encounter for general adult medical examination without abnormal findings: Secondary | ICD-10-CM

## 2013-09-20 DIAGNOSIS — F32A Depression, unspecified: Secondary | ICD-10-CM

## 2013-09-20 DIAGNOSIS — F341 Dysthymic disorder: Secondary | ICD-10-CM

## 2013-09-20 DIAGNOSIS — J309 Allergic rhinitis, unspecified: Secondary | ICD-10-CM

## 2013-09-20 DIAGNOSIS — Z8639 Personal history of other endocrine, nutritional and metabolic disease: Secondary | ICD-10-CM

## 2013-09-20 DIAGNOSIS — E785 Hyperlipidemia, unspecified: Secondary | ICD-10-CM

## 2013-09-20 DIAGNOSIS — F419 Anxiety disorder, unspecified: Secondary | ICD-10-CM

## 2013-09-20 DIAGNOSIS — K219 Gastro-esophageal reflux disease without esophagitis: Secondary | ICD-10-CM

## 2013-09-20 DIAGNOSIS — I10 Essential (primary) hypertension: Secondary | ICD-10-CM

## 2013-09-20 DIAGNOSIS — E039 Hypothyroidism, unspecified: Secondary | ICD-10-CM

## 2013-09-20 LAB — POCT URINALYSIS DIPSTICK
Bilirubin, UA: NEGATIVE
Blood, UA: NEGATIVE
Glucose, UA: NEGATIVE
Ketones, UA: NEGATIVE
Leukocytes, UA: NEGATIVE
Nitrite, UA: NEGATIVE
Protein, UA: NEGATIVE
Spec Grav, UA: 1.015
Urobilinogen, UA: NEGATIVE
pH, UA: 6.5

## 2013-09-20 MED ORDER — VARENICLINE TARTRATE 0.5 MG X 11 & 1 MG X 42 PO MISC: ORAL | Status: DC

## 2013-10-02 ENCOUNTER — Other Ambulatory Visit: Payer: Self-pay | Admitting: Internal Medicine

## 2013-11-24 ENCOUNTER — Encounter: Payer: Self-pay | Admitting: Internal Medicine

## 2013-11-24 NOTE — Progress Notes (Signed)
Subjective:    Patient ID: Megan Williams, female    DOB: [birth year], 68 y.o.   MRN: [id number]  HPI  68 year old White Female in today for health maintenance exam and evaluation of medical issues. She has a history of anxiety depression, insomnia, hypothyroidism, hypertriglyceridemia, hypertension and history of smoking. Has COPD exacerbations on occasion with bronchitis. History of allergic rhinitis and vitamin D deficiency.  Past medical history: Tonsillectomy 1963, thyroidectomy 1968, hysterectomy without oophorectomy 1979, cholecystectomy 1993. Cervical spine 3 level fusion C4-C7 done in 2005 by Dr. Ellene Route. Joint reconstruction of right thumb 2008 by Dr. Daylene Katayama. Took Prozac under the care of Dr. Marilynne Drivers and Cymbalta under the care of Dr. Juventino Slovak. Try Chantix for cigarette addiction but it caused a bad dreams. Tried Wellbutrin but was unable to stop smoking.  History of L4-L5 decompression and stabilization by Dr. Ellene Route October 2012. History of GE reflux. Smokes one half to a pack of cigarettes daily. Has smoked for over 40 years.  Social history: She is married. Has 2 adult children. 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 She and her husband are retired.  Family history unknown since she is adopted.  Zostavax vaccine December 2012. Colonoscopy by Dr. Collene Mares in 2005 and is due to be repeated this Fall.    Review of Systems  Constitutional: Positive for fatigue.  HENT: Negative.   Eyes: Negative.   Respiratory: Negative.   Cardiovascular: Negative.   Genitourinary: Negative.   Neurological: Negative.   Psychiatric/Behavioral:       Long-standing history of anxiety and depression       Objective:   Physical Exam  Vitals reviewed. Constitutional: She is oriented to person, place, and time. She appears well-developed and well-nourished. No distress.  HENT:  Head: Normocephalic and atraumatic.  Right Ear: External ear normal.  Left Ear: External ear  normal.  Mouth/Throat: Oropharynx is clear and moist. No oropharyngeal exudate.  Eyes: Conjunctivae and EOM are normal. Pupils are equal, round, and reactive to light. Right eye exhibits no discharge. Left eye exhibits no discharge. No scleral icterus.  Neck: Neck supple. No JVD present. No tracheal deviation present. No thyromegaly present.  Cardiovascular: Normal rate, regular rhythm and normal heart sounds.   Pulmonary/Chest: Effort normal and breath sounds normal. No respiratory distress. She has no wheezes. She exhibits no tenderness.  Breasts normal female  Abdominal: Soft. Bowel sounds are normal. She exhibits no distension and no mass. There is no tenderness. There is no rebound and no guarding.  Genitourinary:  Pap done 2014. Bimanual normal.  Musculoskeletal: Normal range of motion. She exhibits no edema.  Lymphadenopathy:    She has no cervical adenopathy.  Neurological: She is alert and oriented to person, place, and time. She has normal reflexes. She displays normal reflexes. No cranial nerve deficit. Coordination normal.  Skin: Skin is warm and dry. No rash noted. She is not diaphoretic.  Psychiatric: She has a normal mood and affect. Her behavior is normal. Judgment and thought content normal.          Assessment & Plan:  Hypothyroidism-stable on thyroid replacement  Anxiety depression-stable  History of smoking  Hypertension  Hypertriglyceridemia  GE reflux  Plan: Continue same medications and return in 6 months  Subjective:   Patient presents for Medicare Annual/Subsequent preventive examination.  Review Past Medical/Family/Social: See above   Risk Factors  Current exercise habits: Minimal exercise Dietary issues discussed: Low-fat low-carb  Cardiac risk factors:  Smoking, hypertension, hypertriglyceridemia  Depression Screen  (Note: if answer to either of the following is "Yes", a more complete depression screening is indicated)   Over the past two  weeks, have you felt down, depressed or hopeless? No  Over the past two weeks, have you felt little interest or pleasure in doing things? No Have you lost interest or pleasure in daily life? No Do you often feel hopeless? No Do you cry easily over simple problems? No   Activities of Daily Living  In your present state of health, do you have any difficulty performing the following activities?:   Driving? No  Managing money? No  Feeding yourself? No  Getting from bed to chair? No  Climbing a flight of stairs? No  Preparing food and eating?: No  Bathing or showering? No  Getting dressed: No  Getting to the toilet? No  Using the toilet:No  Moving around from place to place: No  In the past year have you fallen or had a near fall?:No  Are you sexually active? No  Do you have more than one partner? No   Hearing Difficulties: No  Do you often ask people to speak up or repeat themselves? No  Do you experience ringing or noises in your ears? No  Do you have difficulty understanding soft or whispered voices? No  Do you feel that you have a problem with memory? No Do you often misplace items? No    Home Safety:  Do you have a smoke alarm at your residence? Yes Do you have grab bars in the bathroom?yes Do you have throw rugs in your house? yes   Cognitive Testing  Alert? Yes Normal Appearance?Yes  Oriented to person? Yes Place? Yes  Time? Yes  Recall of three objects? Yes  Can perform simple calculations? Yes  Displays appropriate judgment?Yes  Can read the correct time from a watch face?Yes   List the Names of Other Physician/Practitioners you currently use:  See referral list for the physicians patient is currently seeing.     Review of Systems:   Objective:     General appearance: Appears stated age Head: Normocephalic, without obvious abnormality, atraumatic  Eyes: conj clear, EOMi PEERLA  Ears: normal TM's and external ear canals both ears  Nose: Nares normal.  Septum midline. Mucosa normal. No drainage or sinus tenderness.  Throat: lips, mucosa, and tongue normal; teeth and gums normal  Neck: no adenopathy, no carotid bruit, no JVD, supple, symmetrical, trachea midline and thyroid not enlarged, symmetric, no tenderness/mass/nodules  No CVA tenderness.  Lungs: clear to auscultation bilaterally  Breasts: normal appearance, no masses or tenderness Heart: regular rate and rhythm, S1, S2 normal, no murmur, click, rub or gallop  Abdomen: soft, non-tender; bowel sounds normal; no masses, no organomegaly  Musculoskeletal: ROM normal in all joints, no crepitus, no deformity, Normal muscle strengthen. Back  is symmetric, no curvature. Skin: Skin color, texture, turgor normal. No rashes or lesions  Lymph nodes: Cervical, supraclavicular, and axillary nodes normal.  Neurologic: CN 2 -12 Normal, Normal symmetric reflexes. Normal coordination and gait  Psych: Alert & Oriented x 3, Mood appear stable.    Assessment:    Annual wellness medicare exam   Plan:    During the course of the visit the patient was educated and counseled about appropriate screening and preventive services including:  Recommend annual mammogram      Patient Instructions (the written plan) was given to the patient.  Medicare Attestation  I have  personally reviewed:  The patient's medical and social history  Their use of alcohol, tobacco or illicit drugs  Their current medications and supplements  The patient's functional ability including ADLs,fall risks, home safety risks, cognitive, and hearing and visual impairment  Diet and physical activities  Evidence for depression or mood disorders  The patient's weight, height, BMI, and visual acuity have been recorded in the chart. I have made referrals, counseling, and provided education to the patient based on review of the above and I have provided the patient with a written personalized care plan for preventive services.

## 2013-11-24 NOTE — Patient Instructions (Signed)
Continue same medications and return in 6 months. Colonoscopy due in the Fall

## 2013-12-31 ENCOUNTER — Encounter: Payer: Self-pay | Admitting: Internal Medicine

## 2014-02-11 ENCOUNTER — Encounter: Payer: Self-pay | Admitting: Women's Health

## 2014-02-12 ENCOUNTER — Encounter: Payer: Self-pay | Admitting: Women's Health

## 2014-02-13 ENCOUNTER — Encounter: Payer: Self-pay | Admitting: Women's Health

## 2014-02-18 ENCOUNTER — Other Ambulatory Visit: Payer: Self-pay | Admitting: Women's Health

## 2014-02-18 DIAGNOSIS — Z1231 Encounter for screening mammogram for malignant neoplasm of breast: Secondary | ICD-10-CM

## 2014-03-13 ENCOUNTER — Ambulatory Visit (HOSPITAL_COMMUNITY)
Admission: RE | Admit: 2014-03-13 | Discharge: 2014-03-13 | Disposition: A | Discharge status: Home / Self Care | Payer: PPO | Source: Ambulatory Visit | Attending: Women's Health | Admitting: Women's Health

## 2014-03-13 DIAGNOSIS — Z1231 Encounter for screening mammogram for malignant neoplasm of breast: Secondary | ICD-10-CM | POA: Diagnosis not present

## 2014-03-14 ENCOUNTER — Encounter: Payer: Self-pay | Admitting: Women's Health

## 2014-03-26 ENCOUNTER — Other Ambulatory Visit: Payer: PPO | Admitting: Internal Medicine

## 2014-03-26 DIAGNOSIS — E039 Hypothyroidism, unspecified: Secondary | ICD-10-CM

## 2014-03-26 LAB — TSH: TSH: 0.105 u[IU]/mL — ABNORMAL LOW (ref 0.350–4.500)

## 2014-03-28 ENCOUNTER — Ambulatory Visit (INDEPENDENT_AMBULATORY_CARE_PROVIDER_SITE_OTHER): Payer: PPO | Admitting: Internal Medicine

## 2014-03-28 ENCOUNTER — Encounter: Payer: Self-pay | Admitting: Internal Medicine

## 2014-03-28 VITALS — BP 122/76 | HR 95 | Temp 98.0°F | Wt 117.0 lb

## 2014-03-28 DIAGNOSIS — Z8659 Personal history of other mental and behavioral disorders: Secondary | ICD-10-CM | POA: Diagnosis not present

## 2014-03-28 DIAGNOSIS — E039 Hypothyroidism, unspecified: Secondary | ICD-10-CM | POA: Diagnosis not present

## 2014-03-28 MED ORDER — LEVOTHYROXINE SODIUM 112 MCG PO TABS: 112.0000 ug | ORAL_TABLET | Freq: Every day | ORAL | Status: DC

## 2014-03-28 MED ORDER — PANTOPRAZOLE SODIUM 40 MG PO TBEC
40.0000 mg | DELAYED_RELEASE_TABLET | Freq: Every day | ORAL | Status: DC
Start: 2014-03-28 — End: 2016-10-26

## 2014-03-28 NOTE — Patient Instructions (Signed)
Decrease thyroid replacement to Levothroid 0.112 mg daily and return in 6 weeks for TSH.

## 2014-03-28 NOTE — Progress Notes (Signed)
   Subjective:    Patient ID: Megan Williams, female    DOB: [birth year], 69 y.o.   MRN: [id number]  HPI  Here today to follow-up on hypothyroidism. TSH remains low on Synthroid 0.125 mg daily. Is feeling well. Continues to see Donata Clay  for counseling. 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 Doesn't like the gray, snowy weather.    Review of Systems     Objective:   Physical Exam  Neck is supple without mass JVD or adenopathy. TSH is low at 0.105. Affect is cheerful.      Assessment & Plan:  Hypothyroidism with low TSH indicating thyroid replacement is excessive. Decrease levothyroxine dose to 0.0 0.112 mg daily follow-up in 6 weeks  Depression-stable  Plan:

## 2014-03-31 ENCOUNTER — Encounter: Payer: Self-pay | Admitting: Internal Medicine

## 2014-04-01 ENCOUNTER — Other Ambulatory Visit: Payer: Self-pay | Admitting: *Deleted

## 2014-04-01 ENCOUNTER — Telehealth: Payer: Self-pay | Admitting: Internal Medicine

## 2014-04-01 MED ORDER — VALACYCLOVIR HCL 500 MG PO TABS: 500.0000 mg | ORAL_TABLET | Freq: Two times a day (BID) | ORAL | Status: DC

## 2014-04-01 MED ORDER — VARENICLINE TARTRATE 0.5 MG X 11 & 1 MG X 42 PO MISC: ORAL | Status: DC

## 2014-04-01 NOTE — Telephone Encounter (Signed)
Please do these refills.

## 2014-04-01 NOTE — Telephone Encounter (Signed)
Patient calls and needs a refill on her Valtrex 500 mg.  She takes 1 tablet bid.  Patient would also like a refill on Chantix Starting Month Pak.  She did well from July 2015 through October.  Then she fell "off the wagon".  She would like to try again to stop smoking.    Pharmacy:  [facility].

## 2014-04-01 NOTE — Telephone Encounter (Signed)
Refills on Valtrex and Chantix starter pack sent to pahrmacy

## 2014-04-25 ENCOUNTER — Other Ambulatory Visit: Payer: Self-pay | Admitting: Internal Medicine

## 2014-05-07 ENCOUNTER — Other Ambulatory Visit: Payer: PPO | Admitting: Internal Medicine

## 2014-05-07 DIAGNOSIS — E039 Hypothyroidism, unspecified: Secondary | ICD-10-CM

## 2014-05-07 LAB — TSH: TSH: 0.488 u[IU]/mL (ref 0.350–4.500)

## 2014-05-09 ENCOUNTER — Encounter: Payer: Self-pay | Admitting: Internal Medicine

## 2014-05-09 ENCOUNTER — Ambulatory Visit (INDEPENDENT_AMBULATORY_CARE_PROVIDER_SITE_OTHER): Payer: PPO | Admitting: Internal Medicine

## 2014-05-09 VITALS — BP 104/70 | HR 74 | Temp 98.2°F | Wt 116.0 lb

## 2014-05-09 DIAGNOSIS — Z720 Tobacco use: Secondary | ICD-10-CM | POA: Diagnosis not present

## 2014-05-09 DIAGNOSIS — Z658 Other specified problems related to psychosocial circumstances: Secondary | ICD-10-CM

## 2014-05-09 DIAGNOSIS — F418 Other specified anxiety disorders: Secondary | ICD-10-CM

## 2014-05-09 DIAGNOSIS — R7989 Other specified abnormal findings of blood chemistry: Secondary | ICD-10-CM

## 2014-05-09 DIAGNOSIS — F329 Major depressive disorder, single episode, unspecified: Secondary | ICD-10-CM

## 2014-05-09 DIAGNOSIS — F32A Depression, unspecified: Secondary | ICD-10-CM

## 2014-05-09 DIAGNOSIS — E039 Hypothyroidism, unspecified: Secondary | ICD-10-CM | POA: Diagnosis not present

## 2014-05-09 DIAGNOSIS — Z87891 Personal history of nicotine dependence: Secondary | ICD-10-CM

## 2014-05-09 DIAGNOSIS — R946 Abnormal results of thyroid function studies: Secondary | ICD-10-CM | POA: Diagnosis not present

## 2014-05-09 DIAGNOSIS — F419 Anxiety disorder, unspecified: Secondary | ICD-10-CM

## 2014-05-09 DIAGNOSIS — F439 Reaction to severe stress, unspecified: Secondary | ICD-10-CM

## 2014-05-13 ENCOUNTER — Encounter: Payer: Self-pay | Admitting: Internal Medicine

## 2014-05-13 NOTE — Telephone Encounter (Signed)
Reviewed Prevnar vaccine information with patient. Patient to get vaccine in April

## 2014-05-29 ENCOUNTER — Encounter: Payer: Self-pay | Admitting: Internal Medicine

## 2014-05-29 NOTE — Patient Instructions (Signed)
Return in July for physical examination. Continue Synthroid 0.112 mg daily. Try Chantix to stop smoking.

## 2014-05-29 NOTE — Progress Notes (Signed)
   Subjective:    Patient ID: Megan Williams, female    DOB: 1945/12/08, 69 y.o.   MRN: [id number]  HPI 69 year old Female with history of depression and hypothyroidism in today for follow-up. Continues to see counselor. Has issues with husband who never wants to go anywhere with her. She would like to travel some. Sees Noemi Chapel for counseling. Is on Desyrel, Effexor and Xanax. These meds have helped her greatly along with the counseling. She realizes she can't change the situation but she can make choices. At last visit decreased thyroid replacement medication from 0.125 mg to 0.112 mg daily. Here for follow-up.    Review of Systems     Objective:   Physical Exam  No thyromegaly. Spent 25 minutes speaking with patient about these issues. TSH is now normal.      Assessment & Plan:  History of GE reflux-refill PPI  Hypothyroidism-continue Levothroid 0.112 mg daily. Follow-up in July, physical exam  History smoking-Chantix starter pack prescribed  Plan: Schedule for physical examination in July

## 2014-06-04 ENCOUNTER — Telehealth: Payer: Self-pay | Admitting: *Deleted

## 2014-06-04 ENCOUNTER — Ambulatory Visit (INDEPENDENT_AMBULATORY_CARE_PROVIDER_SITE_OTHER): Payer: PPO | Admitting: Women's Health

## 2014-06-04 ENCOUNTER — Encounter: Payer: Self-pay | Admitting: Women's Health

## 2014-06-04 VITALS — BP 115/78 | Ht 62.0 in | Wt 115.0 lb

## 2014-06-04 DIAGNOSIS — Z01419 Encounter for gynecological examination (general) (routine) without abnormal findings: Secondary | ICD-10-CM | POA: Diagnosis not present

## 2014-06-04 DIAGNOSIS — M8580 Other specified disorders of bone density and structure, unspecified site: Secondary | ICD-10-CM | POA: Diagnosis not present

## 2014-06-04 DIAGNOSIS — Z79890 Hormone replacement therapy (postmenopausal): Secondary | ICD-10-CM | POA: Diagnosis not present

## 2014-06-04 MED ORDER — ESTRADIOL 1 MG PO TABS: 1.0000 mg | ORAL_TABLET | Freq: Every day | ORAL | Status: DC

## 2014-06-04 NOTE — Progress Notes (Signed)
Megan Williams 67-12-1945 [id number]    History:    Presents for annual exam.  1970 TVH for abnormal Paps with normal Paps following. Has been on estradiol 2 mg, unable to tolerate stopping. Continues to smoke 5 cigarettes daily. Osteopenia DEXA stable, 01/2014 -2 at left femoral neck had been -1.9. Normal mammogram history. Hypertension/hypothyroidism anxiety and depression managed by primary care and psychiatrist. 2013 negative colonoscopy. Current on vaccines.  Past medical history, past surgical history, family history and social history were all reviewed and documented in the EPIC chart. 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  ROS:  A ROS was performed and pertinent positives and negatives are included.  Exam:  Filed Vitals:   06/04/14 0914  BP: 115/78    General appearance:  Normal Thyroid:  Symmetrical, normal in size, without palpable masses or nodularity. Respiratory  Auscultation:  Clear without wheezing or rhonchi Cardiovascular  Auscultation:  Regular rate, without rubs, murmurs or gallops  Edema/varicosities:  Not grossly evident Abdominal  Soft,nontender, without masses, guarding or rebound.  Liver/spleen:  No organomegaly noted  Hernia:  None appreciated  Skin  Inspection:  Grossly normal   Breasts: Examined lying and sitting.     Right: Without masses, retractions, discharge or axillary adenopathy.     Left: Without masses, retractions, discharge or axillary adenopathy. Gentitourinary   Inguinal/mons:  Normal without inguinal adenopathy  External genitalia:  Normal  BUS/Urethra/Skene's glands:  Normal  Vagina:  Normal  Cervix:  Normal  Uterus:   normal in size, shape and contour.  Midline and mobile  Adnexa/parametria:     Rt: Without masses or tenderness.   Lt: Without masses or tenderness.  Anus and perineum: Normal  Digital rectal exam: Normal sphincter tone without palpated masses or  tenderness  Assessment/Plan:  69 y.o. [name] G3P3 for annual exam.    TVH on HRT Smoker Osteopenia Hypertension/hypothyroidism/HSV-primary care manages labs and meds Anxiety/depression-psychiatrist and manages  Plan: Aware of hazards of smoking is trying to quit currently on Chantix per primary care. HRT reviewed women's health initiative and less is best, will try estradiol 1 mg and gradually decreased to stop. Prescription, risks of blood clots, strokes, breast cancer reviewed. SBE's, continue annual screening mammogram, 3-D tomography reviewed and encouraged history of dense breasts.  UA. Home safety, fall prevention and importance of regular exercise reviewed.   Huel Cote [facility], 10:34 AM 06/04/2014

## 2014-06-04 NOTE — Patient Instructions (Signed)
Health Recommendations for Postmenopausal Women Respected and ongoing research has looked at the most common causes of death, disability, and poor quality of life in postmenopausal women. The causes include heart disease, diseases of blood vessels, diabetes, depression, cancer, and bone loss (osteoporosis). Many things can be done to help lower the chances of developing these and other common problems. CARDIOVASCULAR DISEASE Heart Disease: A heart attack is a medical emergency. Know the signs and symptoms of a heart attack. Below are things women can do to reduce their risk for heart disease.   Do not smoke. If you smoke, quit.  Aim for a healthy weight. Being overweight causes many preventable deaths. Eat a healthy and balanced diet and drink an adequate amount of liquids.  Get moving. Make a commitment to be more physically active. Aim for 30 minutes of activity on most, if not all days of the week.  Eat for heart health. Choose a diet that is low in saturated fat and cholesterol and eliminate trans fat. Include whole grains, vegetables, and fruits. Read and understand the labels on food containers before buying.  Know your numbers. Ask your caregiver to check your blood pressure, cholesterol (total, HDL, LDL, triglycerides) and blood glucose. Work with your caregiver on improving your entire clinical picture.  High blood pressure. Limit or stop your table salt intake (try salt substitute and food seasonings). Avoid salty foods and drinks. Read labels on food containers before buying. Eating well and exercising can help control high blood pressure. STROKE  Stroke is a medical emergency. Stroke may be the result of a blood clot in a blood vessel in the brain or by a brain hemorrhage (bleeding). Know the signs and symptoms of a stroke. To lower the risk of developing a stroke:  Avoid fatty foods.  Quit smoking.  Control your diabetes, blood pressure, and irregular heart rate. THROMBOPHLEBITIS  (BLOOD CLOT) OF THE LEG  Becoming overweight and leading a stationary lifestyle may also contribute to developing blood clots. Controlling your diet and exercising will help lower the risk of developing blood clots. CANCER SCREENING  Breast Cancer: Take steps to reduce your risk of breast cancer.  You should practice "breast self-awareness." This means understanding the normal appearance and feel of your breasts and should include breast self-examination. Any changes detected, no matter how small, should be reported to your caregiver.  After age 4, you should have a clinical breast exam (CBE) every year.  Starting at age 48, you should consider having a mammogram (breast X-ray) every year.  If you have a family history of breast cancer, talk to your caregiver about genetic screening.  If you are at high risk for breast cancer, talk to your caregiver about having an MRI and a mammogram every year.  Intestinal or Stomach Cancer: Tests to consider are a rectal exam, fecal occult blood, sigmoidoscopy, and colonoscopy. Women who are high risk may need to be screened at an earlier age and more often.  Cervical Cancer:  Beginning at age 72, you should have a Pap test every 3 years as long as the past 3 Pap tests have been normal.  If you have had past treatment for cervical cancer or a condition that could lead to cancer, you need Pap tests and screening for cancer for at least 20 years after your treatment.  If you had a hysterectomy for a problem that was not cancer or a condition that could lead to cancer, then you no longer need Pap tests.  If you are between ages 65 and 70, and you have had normal Pap tests going back 10 years, you no longer need Pap tests.  If Pap tests have been discontinued, risk factors (such as a new sexual partner) need to be reassessed to determine if screening should be resumed.  Some medical problems can increase the chance of getting cervical cancer. In these  cases, your caregiver may recommend more frequent screening and Pap tests.  Uterine Cancer: If you have vaginal bleeding after reaching menopause, you should notify your caregiver.  Ovarian Cancer: Other than yearly pelvic exams, there are no reliable tests available to screen for ovarian cancer at this time except for yearly pelvic exams.  Lung Cancer: Yearly chest X-rays can detect lung cancer and should be done on high risk women, such as cigarette smokers and women with chronic lung disease (emphysema).  Skin Cancer: A complete body skin exam should be done at your yearly examination. Avoid overexposure to the sun and ultraviolet light lamps. Use a strong sun block cream when in the sun. All of these things are important for lowering the risk of skin cancer. MENOPAUSE Menopause Symptoms: Hormone therapy products are effective for treating symptoms associated with menopause:  Moderate to severe hot flashes.  Night sweats.  Mood swings.  Headaches.  Tiredness.  Loss of sex drive.  Insomnia.  Other symptoms. Hormone replacement carries certain risks, especially in older women. Women who use or are thinking about using estrogen or estrogen with progestin treatments should discuss that with their caregiver. Your caregiver will help you understand the benefits and risks. The ideal dose of hormone replacement therapy is not known. The Food and Drug Administration (FDA) has concluded that hormone therapy should be used only at the lowest doses and for the shortest amount of time to reach treatment goals.  OSTEOPOROSIS Protecting Against Bone Loss and Preventing Fracture If you use hormone therapy for prevention of bone loss (osteoporosis), the risks for bone loss must outweigh the risk of the therapy. Ask your caregiver about other medications known to be safe and effective for preventing bone loss and fractures. To guard against bone loss or fractures, the following is recommended:  If  you are younger than age 50, take 1000 mg of calcium and at least 600 mg of Vitamin D per day.  If you are older than age 50 but younger than age 70, take 1200 mg of calcium and at least 600 mg of Vitamin D per day.  If you are older than age 70, take 1200 mg of calcium and at least 800 mg of Vitamin D per day. Smoking and excessive alcohol intake increases the risk of osteoporosis. Eat foods rich in calcium and vitamin D and do weight bearing exercises several times a week as your caregiver suggests. DIABETES Diabetes Mellitus: If you have type I or type 2 diabetes, you should keep your blood sugar under control with diet, exercise, and recommended medication. Avoid starchy and fatty foods, and too many sweets. Being overweight can make diabetes control more difficult. COGNITION AND MEMORY Cognition and Memory: Menopausal hormone therapy is not recommended for the prevention of cognitive disorders such as Alzheimer's disease or memory loss.  DEPRESSION  Depression may occur at any age, but it is common in elderly women. This may be because of physical, medical, social (loneliness), or financial problems and needs. If you are experiencing depression because of medical problems and control of symptoms, talk to your caregiver about this. Physical   activity and exercise may help with mood and sleep. Community and volunteer involvement may improve your sense of value and worth. If you have depression and you feel that the problem is getting worse or becoming severe, talk to your caregiver about which treatment options are best for you. ACCIDENTS  Accidents are common and can be serious in elderly woman. Prepare your house to prevent accidents. Eliminate throw rugs, place hand bars in bath, shower, and toilet areas. Avoid wearing high heeled shoes or walking on wet, snowy, and icy areas. Limit or stop driving if you have vision or hearing problems, or if you feel you are unsteady with your movements and  reflexes. HEPATITIS C Hepatitis C is a type of viral infection affecting the liver. It is spread mainly through contact with blood from an infected person. It can be treated, but if left untreated, it can lead to severe liver damage over the years. Many people who are infected do not know that the virus is in their blood. If you are a "baby-boomer", it is recommended that you have one screening test for Hepatitis C. IMMUNIZATIONS  Several immunizations are important to consider having during your senior years, including:   Tetanus, diphtheria, and pertussis booster shot.  Influenza every year before the flu season begins.  Pneumonia vaccine.  Shingles vaccine.  Others, as indicated based on your specific needs. Talk to your caregiver about these. Document Released: 04/09/2005 Document Revised: 07/02/2013 Document Reviewed: 12/04/2007 ExitCare Patient Information 2015 [employer]. This information is not intended to replace advice given to you by your health care provider. Make sure you discuss any questions you have with your health care provider. Exercise to Stay Healthy Exercise helps you become and stay healthy. EXERCISE IDEAS AND TIPS Choose exercises that:  You enjoy.  Fit into your day. You do not need to exercise really hard to be healthy. You can do exercises at a slow or medium level and stay healthy. You can:  Stretch before and after working out.  Try yoga, Pilates, or tai chi.  Lift weights.  Walk fast, swim, jog, run, climb stairs, bicycle, dance, or rollerskate.  Take aerobic classes. Exercises that burn about 150 calories:  Running 1  [place] in 15 minutes.  Playing volleyball for 45 to 60 minutes.  Washing and waxing a car for 45 to 60 minutes.  Playing touch football for 45 minutes.  Walking 1  [place] in 35 minutes.  Pushing a stroller 1  [place] in 30 minutes.  Playing basketball for 30 minutes.  Raking leaves for 30 minutes.  Bicycling 5  [place] in 30 minutes.  Walking 2 [place] in 30 minutes.  Dancing for 30 minutes.  Shoveling snow for 15 minutes.  Swimming laps for 20 minutes.  Walking up stairs for 15 minutes.  Bicycling 4 [place] in 15 minutes.  Gardening for 30 to 45 minutes.  Jumping rope for 15 minutes.  Washing windows or floors for 45 to 60 minutes. Document Released: 03/20/2010 Document Revised: 05/10/2011 Document Reviewed: 03/20/2010 Exit[employer] Patient Information 2015 [employer]. This information is not intended to replace advice given to you by your health care provider. Make sure you discuss any questions you have with your health care provider.  

## 2014-06-04 NOTE — Telephone Encounter (Signed)
Prior authorization done online for estradiol 1 mg tablet, will wait for response.

## 2014-06-05 ENCOUNTER — Encounter: Payer: Self-pay | Admitting: Internal Medicine

## 2014-06-05 LAB — URINALYSIS W MICROSCOPIC + REFLEX CULTURE
Bacteria, UA: NONE SEEN
Bilirubin Urine: NEGATIVE
Casts: NONE SEEN
Crystals: NONE SEEN
Glucose, UA: NEGATIVE mg/dL
Hgb urine dipstick: NEGATIVE
Ketones, ur: NEGATIVE mg/dL
Leukocytes, UA: NEGATIVE
Nitrite: NEGATIVE
Protein, ur: NEGATIVE mg/dL
Specific Gravity, Urine: 1.015 (ref 1.005–1.030)
Squamous Epithelial / LPF: NONE SEEN
Urobilinogen, UA: 0.2 mg/dL (ref 0.0–1.0)
pH: 5 (ref 5.0–8.0)

## 2014-06-07 NOTE — Telephone Encounter (Signed)
MEDICATION APPROVED UNTIL 03/01/15

## 2014-06-18 ENCOUNTER — Ambulatory Visit: Payer: Self-pay | Admitting: Internal Medicine

## 2014-06-25 ENCOUNTER — Ambulatory Visit (INDEPENDENT_AMBULATORY_CARE_PROVIDER_SITE_OTHER): Payer: PPO | Admitting: Internal Medicine

## 2014-06-25 VITALS — BP 118/76 | HR 84 | Temp 98.4°F

## 2014-06-25 DIAGNOSIS — Z23 Encounter for immunization: Secondary | ICD-10-CM | POA: Diagnosis not present

## 2014-06-25 NOTE — Progress Notes (Signed)
Patient presents today for Prevnar vaccine. Patient VS stable. Patient tolerated injection well.

## 2014-07-22 ENCOUNTER — Other Ambulatory Visit: Payer: Self-pay | Admitting: Internal Medicine

## 2014-09-24 ENCOUNTER — Other Ambulatory Visit: Payer: PPO | Admitting: Internal Medicine

## 2014-09-24 ENCOUNTER — Other Ambulatory Visit: Payer: Self-pay | Admitting: Internal Medicine

## 2014-09-24 DIAGNOSIS — R5383 Other fatigue: Secondary | ICD-10-CM

## 2014-09-24 DIAGNOSIS — E559 Vitamin D deficiency, unspecified: Secondary | ICD-10-CM

## 2014-09-24 DIAGNOSIS — E039 Hypothyroidism, unspecified: Secondary | ICD-10-CM

## 2014-09-24 DIAGNOSIS — Z79899 Other long term (current) drug therapy: Secondary | ICD-10-CM

## 2014-09-24 DIAGNOSIS — E785 Hyperlipidemia, unspecified: Secondary | ICD-10-CM

## 2014-09-24 LAB — CBC WITH DIFFERENTIAL/PLATELET
Basophils Absolute: 0 10*3/uL (ref 0.0–0.1)
Basophils Relative: 0 % (ref 0–1)
Eosinophils Absolute: 0 10*3/uL (ref 0.0–0.7)
Eosinophils Relative: 0 % (ref 0–5)
HCT: 39.7 % (ref 36.0–46.0)
Hemoglobin: 13.4 g/dL (ref 12.0–15.0)
Lymphocytes Relative: 26 % (ref 12–46)
Lymphs Abs: 2.6 10*3/uL (ref 0.7–4.0)
MCH: 34.5 pg — ABNORMAL HIGH (ref 26.0–34.0)
MCHC: 33.8 g/dL (ref 30.0–36.0)
MCV: 102.3 fL — ABNORMAL HIGH (ref 78.0–100.0)
MPV: 8.9 fL (ref 8.6–12.4)
Monocytes Absolute: 0.6 10*3/uL (ref 0.1–1.0)
Monocytes Relative: 6 % (ref 3–12)
Neutro Abs: 6.7 10*3/uL (ref 1.7–7.7)
Neutrophils Relative %: 68 % (ref 43–77)
Platelets: 227 10*3/uL (ref 150–400)
RBC: 3.88 MIL/uL (ref 3.87–5.11)
RDW: 13.6 % (ref 11.5–15.5)
WBC: 9.9 10*3/uL (ref 4.0–10.5)

## 2014-09-24 LAB — LIPID PANEL
Cholesterol: 161 mg/dL (ref 125–200)
HDL: 74 mg/dL (ref >=46)
LDL Cholesterol: 44 mg/dL (ref <130)
Total CHOL/HDL Ratio: 2.2 Ratio (ref <=5.0)
Triglycerides: 213 mg/dL — ABNORMAL HIGH (ref <150)
VLDL: 43 mg/dL — ABNORMAL HIGH (ref <30)

## 2014-09-24 LAB — COMPLETE METABOLIC PANEL WITH GFR
ALT: 10 U/L (ref 6–29)
AST: 17 U/L (ref 10–35)
Albumin: 4.2 g/dL (ref 3.6–5.1)
Alkaline Phosphatase: 44 U/L (ref 33–130)
BUN: 13 mg/dL (ref 7–25)
CO2: 26 mEq/L (ref 20–31)
Calcium: 9.2 mg/dL (ref 8.6–10.4)
Chloride: 101 mEq/L (ref 98–110)
Creat: 0.86 mg/dL (ref 0.50–0.99)
GFR, Est African American: 81 mL/min (ref >=60)
GFR, Est Non African American: 70 mL/min (ref >=60)
Glucose, Bld: 97 mg/dL (ref 65–99)
Potassium: 4.6 mEq/L (ref 3.5–5.3)
Sodium: 138 mEq/L (ref 135–146)
Total Bilirubin: 0.6 mg/dL (ref 0.2–1.2)
Total Protein: 6.7 g/dL (ref 6.1–8.1)

## 2014-09-24 LAB — TSH: TSH: 0.099 u[IU]/mL — ABNORMAL LOW (ref 0.350–4.500)

## 2014-09-25 LAB — T4, FREE: Free T4: 0.92 ng/dL (ref 0.80–1.80)

## 2014-09-25 LAB — VITAMIN D 25 HYDROXY (VIT D DEFICIENCY, FRACTURES): Vit D, 25-Hydroxy: 62 ng/mL (ref 30–100)

## 2014-09-26 ENCOUNTER — Ambulatory Visit (INDEPENDENT_AMBULATORY_CARE_PROVIDER_SITE_OTHER): Payer: PPO | Admitting: Internal Medicine

## 2014-09-26 ENCOUNTER — Encounter: Payer: Self-pay | Admitting: Internal Medicine

## 2014-09-26 VITALS — BP 106/68 | HR 95 | Temp 98.2°F | Ht 62.0 in | Wt 115.0 lb

## 2014-09-26 DIAGNOSIS — E039 Hypothyroidism, unspecified: Secondary | ICD-10-CM

## 2014-09-26 DIAGNOSIS — Z9889 Other specified postprocedural states: Secondary | ICD-10-CM

## 2014-09-26 DIAGNOSIS — Z0000 Encounter for general adult medical examination without abnormal findings: Secondary | ICD-10-CM

## 2014-09-26 DIAGNOSIS — M8580 Other specified disorders of bone density and structure, unspecified site: Secondary | ICD-10-CM

## 2014-09-26 DIAGNOSIS — J309 Allergic rhinitis, unspecified: Secondary | ICD-10-CM

## 2014-09-26 DIAGNOSIS — F439 Reaction to severe stress, unspecified: Secondary | ICD-10-CM

## 2014-09-26 DIAGNOSIS — E781 Pure hyperglyceridemia: Secondary | ICD-10-CM | POA: Diagnosis not present

## 2014-09-26 DIAGNOSIS — Z658 Other specified problems related to psychosocial circumstances: Secondary | ICD-10-CM | POA: Diagnosis not present

## 2014-09-26 DIAGNOSIS — R946 Abnormal results of thyroid function studies: Secondary | ICD-10-CM | POA: Diagnosis not present

## 2014-09-26 DIAGNOSIS — F17210 Nicotine dependence, cigarettes, uncomplicated: Secondary | ICD-10-CM | POA: Diagnosis not present

## 2014-09-26 DIAGNOSIS — F418 Other specified anxiety disorders: Secondary | ICD-10-CM | POA: Diagnosis not present

## 2014-09-26 DIAGNOSIS — E890 Postprocedural hypothyroidism: Secondary | ICD-10-CM

## 2014-09-26 DIAGNOSIS — F329 Major depressive disorder, single episode, unspecified: Secondary | ICD-10-CM

## 2014-09-26 DIAGNOSIS — R7989 Other specified abnormal findings of blood chemistry: Secondary | ICD-10-CM

## 2014-09-26 DIAGNOSIS — F32A Depression, unspecified: Secondary | ICD-10-CM

## 2014-09-26 DIAGNOSIS — F419 Anxiety disorder, unspecified: Secondary | ICD-10-CM

## 2014-09-26 LAB — POCT URINALYSIS DIPSTICK
Bilirubin, UA: NEGATIVE
Blood, UA: NEGATIVE
Glucose, UA: NEGATIVE
Ketones, UA: NEGATIVE
Leukocytes, UA: NEGATIVE
Nitrite, UA: NEGATIVE
Protein, UA: NEGATIVE
Spec Grav, UA: >=1.03
Urobilinogen, UA: NEGATIVE
pH, UA: 5

## 2014-09-26 MED ORDER — LEVOTHYROXINE SODIUM 100 MCG PO TABS: 100.0000 ug | ORAL_TABLET | Freq: Every day | ORAL | Status: DC

## 2014-09-26 NOTE — Progress Notes (Signed)
Subjective:    Patient ID: Megan Williams, female    DOB: 10-15-1945, 69 y.o.   MRN: [id number]  HPI 69 year old White Female in today for health maintenance exam and evaluation of medical problems. She has a history of anxiety depression for which she sees Dr. Theodoro Grist, psychiatrist  who prescribes medication and receives counseling with Dr. Doroteo Glassman, psychologist. This has helped her a lot. Still has situational stress with husband. After he retired he went back to work at [employer] and works from 8 AM to 2 PM daily. 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 She feels neglected. He is not interested in sex. He quit taking Cialis.  Lab work done recently shows persistent hypertriglyceridemia. 2 years ago triglycerides were 241, last year 197 and now 213. Continue diet and exercise efforts.  With regard to hypothyroidism, TSH is very low at 0.099 on Synthroid 0.112 mg daily. Patient denies taking excess thyroid medication  Past medical history: She had a thyroidectomy in 1968. Tonsillectomy 1963. Hysterectomy without oophorectomy 1979. Cholecystectomy 1993. Cervical spine 3 level fusion C4-C7 done in 2005 by Dr. Ellene Route. Joint reconstruction of right thumb 2008 by Dr. Daylene Katayama.  History of L4-L5 decompression and stabilization by Dr. Ellene Route October 2012. History of GE reflux.  She took Prozac under the care of Dr. Marilynne Drivers and Cymbalta under the care of Dr. Delilah Shan. She tried Chantix for cigarette addiction but it caused bad dreams. She tried Wellbutrin but was unable to stop smoking. Continues to smoke and is not ready to quit.  History of COPD exacerbations on occasion with bronchitis. History of allergic rhinitis and vitamin D deficiency but recent vitamin D level was normal.  Social history: She is married. She has 2 adult children. 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 She had her husband are retired.  Family history: Unknown since she is  adopted  Colonoscopy done by Dr. Collene Mares in 2005  Zostavax vaccine December 2012.      Review of Systems  Constitutional: Negative.   Eyes: Negative.   Respiratory:       Long-term history of smoking  Cardiovascular: Negative.   Gastrointestinal:       History of GE reflux  Genitourinary: Negative.   Neurological: Negative.   Hematological: Negative.   Psychiatric/Behavioral:       Anxiety and depression under good control       Objective:   Physical Exam  Constitutional: She is oriented to person, place, and time. She appears well-developed and well-nourished. No distress.  HENT:  Head: Normocephalic and atraumatic.  Right Ear: External ear normal.  Left Ear: External ear normal.  Nose: Nose normal.  Mouth/Throat: Oropharynx is clear and moist. No oropharyngeal exudate.  Eyes: Conjunctivae and EOM are normal. Pupils are equal, round, and reactive to light. Right eye exhibits no discharge. Left eye exhibits no discharge.  Neck: Neck supple. No JVD present. No thyromegaly present.  Cardiovascular: Normal rate, regular rhythm, normal heart sounds and intact distal pulses.   No murmur heard. Pulmonary/Chest: Effort normal and breath sounds normal. She has no wheezes. She has no rales.  Breasts normal female without masses  Abdominal: Soft. Bowel sounds are normal. She exhibits no distension and no mass. There is no tenderness. There is no rebound and no guarding.  Genitourinary:  Pap done 2014  Musculoskeletal: Normal range of motion. She exhibits no edema.  Lymphadenopathy:    She has no cervical adenopathy.  Neurological: She  is alert and oriented to person, place, and time. She has normal reflexes. No cranial nerve deficit. Coordination normal.  Skin: Skin is warm and dry. No rash noted. She is not diaphoretic.  Very tan  Psychiatric: She has a normal mood and affect. Her behavior is normal. Judgment and thought content normal.  Vitals reviewed.          Assessment & Plan:   Hypothyroidism-TSH remains low. Free T4 is low normal. Patient will reduce thyroid replacement to levothyroxin 0.1 mg daily and follow-up in 6 weeks with free T4 and TSH without office visit. Patient denies taking more than one thyroid replacement tablet daily.  History of depression-much improved seeing Doroteo Glassman and Theodoro Grist, MD  Situational stress-issues with husband regarding remain  Hypertriglyceridemia-given information on triglycerides. Continue diet and exercise regimen.  History of smoking.  Plan: Return in 6 weeks for free T4 and TSH. Further instructions to follow after reviewing those labs on levothyroxine 0.1 mg daily          Subjective:   Patient presents for Medicare Annual/Subsequent preventive examination.  Review Past Medical/Family/Social: See above   Risk Factors  Current exercise habits: Says she hasn't been exercising as much recently due to heat Dietary issues discussed: Actually underweight. Recommend low fat low carb diet with 2000 cal daily minimal  Cardiac risk factors: Hypertriglyceridemia  Depression Screen  (Note: if answer to either of the following is "Yes", a more complete depression screening is indicated)   Over the past two weeks, have you felt down, depressed or hopeless? No  Over the past two weeks, have you felt little interest or pleasure in doing things? No Have you lost interest or pleasure in daily life? No Do you often feel hopeless? No Do you cry easily over simple problems? No   Activities of Daily Living  In your present state of health, do you have any difficulty performing the following activities?:   Driving? No  Managing money? No  Feeding yourself? No  Getting from bed to chair? No  Climbing a flight of stairs? No  Preparing food and eating?: No  Bathing or showering? No  Getting dressed: No  Getting to the toilet? No  Using the toilet:No  Moving around from place to place: No   In the past year have you fallen or had a near fall?:No  Are you sexually active? No  Do you have more than one partner? No   Hearing Difficulties: No  Do you often ask people to speak up or repeat themselves? No  Do you experience ringing or noises in your ears? No  Do you have difficulty understanding soft or whispered voices? No  Do you feel that you have a problem with memory? No Do you often misplace items? No    Home Safety:  Do you have a smoke alarm at your residence? Yes Do you have grab bars in the bathroom? Do you have throw rugs in your house?   Cognitive Testing  Alert? Yes Normal Appearance?Yes  Oriented to person? Yes Place? Yes  Time? Yes  Recall of three objects? Yes  Can perform simple calculations? Yes  Displays appropriate judgment?Yes  Can read the correct time from a watch face?Yes   List the Names of Other Physician/Practitioners you currently use:  See referral list for the physicians patient is currently seeing.     Review of Systems: See above   Objective:     General appearance: Appears younger than stated  age Head: Normocephalic, without obvious abnormality, atraumatic  Eyes: conj clear, EOMi PEERLA  Ears: normal TM's and external ear canals both ears  Nose: Nares normal. Septum midline. Mucosa normal. No drainage or sinus tenderness.  Throat: lips, mucosa, and tongue normal; teeth and gums normal  Neck: no adenopathy, no carotid bruit, no JVD, supple, symmetrical, trachea midline and thyroid not enlarged, symmetric, no tenderness/mass/nodules  No CVA tenderness.  Lungs: clear to auscultation bilaterally  Breasts: normal appearance, no masses or tenderness Heart: regular rate and rhythm, S1, S2 normal, no murmur, click, rub or gallop  Abdomen: soft, non-tender; bowel sounds normal; no masses, no organomegaly  Musculoskeletal: ROM normal in all joints, no crepitus, no deformity, Normal muscle strengthen. Back  is symmetric, no  curvature. Skin: Skin color, texture, turgor normal. No rashes or lesions  Lymph nodes: Cervical, supraclavicular, and axillary nodes normal.  Neurologic: CN 2 -12 Normal, Normal symmetric reflexes. Normal coordination and gait  Psych: Alert & Oriented x 3, Mood appear stable.    Assessment:    Annual wellness medicare exam   Plan:    During the course of the visit the patient was educated and counseled about appropriate screening and preventive services including:   Annual mammogram  Has had Zostavax vaccine and Prevnar  Colonoscopy done 2013   Patient Instructions (the written plan) was given to the patient.  Medicare Attestation  I have personally reviewed:  The patient's medical and social history  Their use of alcohol, tobacco or illicit drugs  Their current medications and supplements  The patient's functional ability including ADLs,fall risks, home safety risks, cognitive, and hearing and visual impairment  Diet and physical activities  Evidence for depression or mood disorders  The patient's weight, height, BMI, and visual acuity have been recorded in the chart. I have made referrals, counseling, and provided education to the patient based on review of the above and I have provided the patient with a written personalized care plan for preventive services.

## 2014-09-26 NOTE — Patient Instructions (Signed)
Decrease levothyroxine to 0.1 mg daily. Return in 6 weeks for free T4 and TSH without office visit. Otherwise return in 6 months

## 2014-09-30 LAB — VITAMIN B12: Vitamin B-12: 235 pg/mL (ref 211–911)

## 2014-09-30 LAB — FOLATE: Folate: 10.9 ng/mL

## 2014-10-01 ENCOUNTER — Telehealth: Payer: Self-pay | Admitting: *Deleted

## 2014-10-01 NOTE — Telephone Encounter (Signed)
Reviewed lab results with patient she will start OTC B12 as instructed she will make an appt for 3 month check when she returns from vacation

## 2014-10-08 ENCOUNTER — Ambulatory Visit (INDEPENDENT_AMBULATORY_CARE_PROVIDER_SITE_OTHER): Payer: PPO | Admitting: Internal Medicine

## 2014-10-08 ENCOUNTER — Encounter: Payer: Self-pay | Admitting: Internal Medicine

## 2014-10-08 VITALS — BP 110/70 | HR 82 | Temp 98.4°F | Wt 115.0 lb

## 2014-10-08 DIAGNOSIS — L568 Other specified acute skin changes due to ultraviolet radiation: Secondary | ICD-10-CM

## 2014-10-08 DIAGNOSIS — T50905A Adverse effect of unspecified drugs, medicaments and biological substances, initial encounter: Secondary | ICD-10-CM

## 2014-10-08 MED ORDER — PREDNISONE 10 MG PO TABS: ORAL_TABLET | ORAL | Status: DC

## 2014-10-11 ENCOUNTER — Telehealth: Payer: Self-pay | Admitting: Internal Medicine

## 2014-10-11 ENCOUNTER — Telehealth: Payer: Self-pay | Admitting: *Deleted

## 2014-10-11 MED ORDER — PREDNISONE 10 MG PO TABS: ORAL_TABLET | ORAL | Status: DC

## 2014-10-11 NOTE — Telephone Encounter (Signed)
Patient notified additional prednisone script sent to pharmacy

## 2014-10-11 NOTE — Telephone Encounter (Signed)
Refill 6 days of Prednisone.

## 2014-10-11 NOTE — Telephone Encounter (Signed)
Patient states she's on day 5 of Prednisone.  She has a blister still on L great toes that is still fluid filled and the toe next to it still hurting with blister filled.   Her 2 thumbs now have blisters on them and the 2 fore fingers are now hurting.  She is "at a loss" and wants to know what you suggest next.  Doesn't feel the Prednisone has helped.  Do you feel she needs to be seen again?  Or, do you feel it hasn't been long enough for the med's to work?  Or, do you feel she needs to be referred out to a dermatologist?    Please advise.  She's quite anxious and states she's hurting and it hurts to walk.

## 2014-11-07 ENCOUNTER — Other Ambulatory Visit: Payer: PPO | Admitting: Internal Medicine

## 2014-11-07 DIAGNOSIS — E039 Hypothyroidism, unspecified: Secondary | ICD-10-CM

## 2014-11-07 LAB — TSH: TSH: 0.334 u[IU]/mL — ABNORMAL LOW (ref 0.350–4.500)

## 2014-11-07 LAB — T4, FREE: Free T4: 0.96 ng/dL (ref 0.80–1.80)

## 2014-11-08 ENCOUNTER — Telehealth: Payer: Self-pay | Admitting: *Deleted

## 2014-11-08 MED ORDER — LEVOTHYROXINE SODIUM 88 MCG PO TABS: 88.0000 ug | ORAL_TABLET | Freq: Every day | ORAL | Status: DC

## 2014-11-08 NOTE — Telephone Encounter (Signed)
Reviewed lab results and instructions with patient 

## 2014-11-28 NOTE — Progress Notes (Signed)
   Subjective:    Patient ID: Megan Williams, female    DOB: 1945-09-21, 69 y.o.   MRN: [id number]  HPI Patient had been seen here recently for health maintenance examination July 28 and at that time was given prescription for doxycycline. She was planning a trip to [place]. Does spend a lot of time in the sun. Subsequently while on trip to [place] and taking doxycycline she developed blisters on her feet that frightened her. She did not terminate her trip but managed to get through it.    Review of Systems     Objective:   Physical Exam Bullous lesions on feet bilaterally. Did not appear to be secondarily infected       Assessment & Plan:  Photosensitivity reaction secondary to sun exposure with doxycycline  Plan: Sterapred DS 10 mg 6 day dosepak. Stay out of sun. Call with progress report.

## 2014-11-28 NOTE — Patient Instructions (Signed)
Take prednisone as directed. Call if symptoms do not improve. Stay out of sun.

## 2014-12-23 ENCOUNTER — Other Ambulatory Visit: Payer: PPO | Admitting: Internal Medicine

## 2014-12-23 DIAGNOSIS — R7989 Other specified abnormal findings of blood chemistry: Secondary | ICD-10-CM

## 2014-12-23 DIAGNOSIS — E538 Deficiency of other specified B group vitamins: Secondary | ICD-10-CM

## 2014-12-23 DIAGNOSIS — E038 Other specified hypothyroidism: Secondary | ICD-10-CM

## 2014-12-23 LAB — T4, FREE: Free T4: 0.82 ng/dL (ref 0.80–1.80)

## 2014-12-23 LAB — TSH: TSH: 2.145 u[IU]/mL (ref 0.350–4.500)

## 2014-12-23 LAB — VITAMIN B12: Vitamin B-12: >2000 pg/mL — ABNORMAL HIGH (ref 211–911)

## 2014-12-26 ENCOUNTER — Ambulatory Visit (INDEPENDENT_AMBULATORY_CARE_PROVIDER_SITE_OTHER): Payer: PPO | Admitting: Internal Medicine

## 2014-12-26 ENCOUNTER — Encounter: Payer: Self-pay | Admitting: Internal Medicine

## 2014-12-26 VITALS — BP 102/70 | HR 77 | Temp 97.3°F | Resp 20 | Ht 62.0 in | Wt 117.0 lb

## 2014-12-26 DIAGNOSIS — F411 Generalized anxiety disorder: Secondary | ICD-10-CM

## 2014-12-26 DIAGNOSIS — R29818 Other symptoms and signs involving the nervous system: Secondary | ICD-10-CM

## 2014-12-26 DIAGNOSIS — E039 Hypothyroidism, unspecified: Secondary | ICD-10-CM

## 2014-12-26 DIAGNOSIS — G44209 Tension-type headache, unspecified, not intractable: Secondary | ICD-10-CM | POA: Diagnosis not present

## 2014-12-26 DIAGNOSIS — F329 Major depressive disorder, single episode, unspecified: Secondary | ICD-10-CM

## 2014-12-26 DIAGNOSIS — I952 Hypotension due to drugs: Secondary | ICD-10-CM | POA: Diagnosis not present

## 2014-12-26 DIAGNOSIS — F32A Depression, unspecified: Secondary | ICD-10-CM

## 2014-12-26 DIAGNOSIS — R413 Other amnesia: Secondary | ICD-10-CM

## 2014-12-26 DIAGNOSIS — R2689 Other abnormalities of gait and mobility: Secondary | ICD-10-CM

## 2014-12-26 MED ORDER — CYCLOBENZAPRINE HCL 10 MG PO TABS: 10.0000 mg | ORAL_TABLET | Freq: Every day | ORAL | Status: DC

## 2014-12-26 NOTE — Progress Notes (Signed)
   Subjective:    Patient ID: Megan Williams, female    DOB: Dec 31, 1945, 69 y.o.   MRN: [id number]  HPI Here for 3 month follow up. Says she is not feeling well. Has had left neck and occipital headache for several days that she cannot get rid off. Refuses to take prednisone or pain medication. Does not have prescription for muscle relaxant. This was prescribed today. Suggested massage therapy to work out muscle spasm. This seems to be a muscle contraction headache.  Vitamin B-12 level is greater than 2000. She's taking 5000 daily and needs to lower dose to 1000. She can't see that  she feels any better on the B-12. Her level previously was 235. She is obviously able to absorb it and doesn't need IM  B -12 injections.  Has issues now with memory loss. Her speech is slow today. Seems to have trouble collecting her thoughts. I've asked her to review medications with her psychiatrist. She is complaining of balance issues. She says psychiatrist thinks that the issue has to do with inner ear. I'm recommending neuropsychological testing with Dr. Valentina Shaggy  and a neurology consultation. I'm concerned she may be overmedicated. She does seem to require Xanax to keep her anxiety under control. No longer taking Seroquel she says.  Reviewed with her TSH which is stable. We will continue with same dose of thyroid replacement.  Her blood pressure is a bit low today. I have suggested she try holding lisinopril for a few days and watching her blood pressure to see if she will be all right off lisinopril.    Review of Systems     Objective:   Physical Exam  Spent 25 minutes he can with her about all of these issues. She has no thyromegaly. Free T4 and TSH were normal. I'm pleased with dose of thyroid replacement at the present time. Blood pressure stable.      Assessment & Plan:  Hypothyroidism-TSH stable. Continue same dose. Free T4 normal.  Essential hypertension-blood pressure low today 419 systolically.  Hold lisinopril and continue to monitor blood pressure off the medication. Call if blood pressure gets elevated.  Memory loss-refer to Dr. Valentina Shaggy for neuropsychological testing  Left occipital muscle contraction headache-treat with Flexeril and massage.  History of COPD  History of smoking  Hyperlipidemia-not checked at this visit  Balance issues-refer to neurologist for further evaluation of memory loss and balance issues.  Depression-treated by psychiatrist  Plan: Requesting screening for hepatitis C and HIV which will be added. Return July 2016.

## 2014-12-26 NOTE — Patient Instructions (Addendum)
Hold lisinopril for a few days and monitor blood pressure to see if blood pressure improves and balance improves. Discuss memory issues with psychiatrist. Referral to neurologist and neuropsychologist for testing regarding memory and balance issues. Continue oral B-12 but at dose of 1000 g daily. Return Summer 2017.

## 2014-12-31 ENCOUNTER — Ambulatory Visit: Payer: PPO | Admitting: Neurology

## 2014-12-31 ENCOUNTER — Telehealth: Payer: Self-pay | Admitting: *Deleted

## 2014-12-31 NOTE — Telephone Encounter (Signed)
No showed new patient appt 

## 2015-01-01 ENCOUNTER — Encounter: Payer: Self-pay | Admitting: Neurology

## 2015-01-06 ENCOUNTER — Encounter: Payer: Self-pay | Admitting: Neurology

## 2015-01-06 ENCOUNTER — Ambulatory Visit (INDEPENDENT_AMBULATORY_CARE_PROVIDER_SITE_OTHER): Payer: PPO | Admitting: Neurology

## 2015-01-06 VITALS — BP 108/67 | HR 76 | Ht 62.0 in | Wt 119.0 lb

## 2015-01-06 DIAGNOSIS — G43009 Migraine without aura, not intractable, without status migrainosus: Secondary | ICD-10-CM

## 2015-01-06 DIAGNOSIS — R269 Unspecified abnormalities of gait and mobility: Secondary | ICD-10-CM | POA: Diagnosis not present

## 2015-01-06 DIAGNOSIS — R3915 Urgency of urination: Secondary | ICD-10-CM | POA: Diagnosis not present

## 2015-01-06 DIAGNOSIS — R251 Tremor, unspecified: Secondary | ICD-10-CM | POA: Insufficient documentation

## 2015-01-06 NOTE — Progress Notes (Signed)
PATIENT: Megan Williams DOB: [birth year]  Chief Complaint  Patient presents with  . Tremors    She has tremors in her bilateral hands.  . Gait Problem    Feels unsteady when walking.  She has had several falls.  . Memory Loss    MMSE 29/30 - 11 animals.  She is concerned about worsening memory.     HISTORICAL  Megan Williams 69 years old right-handed female, seen in refer by her primary care physician Dr. Tedra Senegal for evaluation of tremor, memory loss, and gait problems    She had a history of hypertension, depression, anxiety, used to work as a Radio broadcast assistant, and church Glass blower/designer  Around 2015, she began to notice memory trouble, she needs family to remind her multiple times, tends to forget people's name, phone number, she used to be able to remember all the congregation's name in the past, her memory trouble since 4 gradually getting worse, she still driving without getting lost  She reported history of migraine since young, for a while in September to October 2015, she has migraines almost on a daily basis, which has improved after stopped taking Trileptal  She also reported mild bilateral hands tremor since 2014, most noticeable when she holding a utensil, or write with a pencil, she also noticed mild bilateral hands weakness, in Thanksgiving 2015, she has dropped her dishes to the floor, because of bilateral hands weakness,  Around 2015, she also noticed mild stiff unsteady gait, worsening urinary urgency, she denies significant neck pain, complains of moderate low back pain, she denies bilateral upper or lower extremity paresthesia  She is adopted, does not know family history, none of her children has tremor  REVIEW OF SYSTEMS: Full 14 system review of systems performed and notable only for blurred vision, feeling cold ALLERGIES: Allergies  Allergen Reactions  . Doxycycline     Skin peeling   . Penicillins Hives    HOME MEDICATIONS: Current Outpatient  Prescriptions  Medication Sig Dispense Refill  . ALPRAZolam (XANAX) 1 MG tablet Take 1 mg by mouth 2 times daily at 12 noon and 4 pm.    . buPROPion (WELLBUTRIN XL) 150 MG 24 hr tablet 150 mg.  2  . Cholecalciferol (VITAMIN D PO) Take 1 tablet by mouth daily.     . cyclobenzaprine (FLEXERIL) 10 MG tablet Take 1 tablet (10 mg total) by mouth at bedtime. 30 tablet 0  . estradiol (ESTRACE) 1 MG tablet Take 1 tablet (1 mg total) by mouth daily. 90 tablet 4  . fexofenadine (ALLEGRA) 60 MG tablet Take 60 mg by mouth daily.      Marland Kitchen levothyroxine (SYNTHROID, LEVOTHROID) 88 MCG tablet Take 1 tablet (88 mcg total) by mouth daily. 90 tablet 0  . lisinopril (PRINIVIL,ZESTRIL) 5 MG tablet TAKE 1 TABLET (5 MG TOTAL) BY MOUTH DAILY. 90 tablet 3  . meloxicam (MOBIC) 15 MG tablet Take 15 mg by mouth daily.    . Multiple Vitamin (MULTIVITAMIN) tablet Take 1 tablet by mouth daily.    . naproxen sodium (ANAPROX) 220 MG tablet Take 220 mg by mouth 2 (two) times daily with a meal.      . Oxcarbazepine (TRILEPTAL) 300 MG tablet Take 300 mg by mouth daily. At hs    . pantoprazole (PROTONIX) 40 MG tablet Take 1 tablet (40 mg total) by mouth daily. 90 tablet 3  . predniSONE (DELTASONE) 10 MG tablet Take as directed with food 6-5-4-3-2-1 (Patient not taking: Reported on  12/26/2014) 21 tablet 0  . tolterodine (DETROL LA) 4 MG 24 hr capsule Take 4 mg by mouth daily.     . traZODone (DESYREL) 100 MG tablet Take 100 mg by mouth at bedtime. 1/2 - 1 at hs prn    . valACYclovir (VALTREX) 500 MG tablet Take 1 tablet (500 mg total) by mouth 2 (two) times daily. 20 tablet 5  . venlafaxine XR (EFFEXOR-XR) 150 MG 24 hr capsule Take 225 mg by mouth daily with breakfast.      No current facility-administered medications for this visit.    PAST MEDICAL HISTORY: Past Medical History  Diagnosis Date  . Hypertension   . Allergy   . Anxiety   . Depression   . Vitamin D deficiency   . Herpes simplex   . Osteopenia   . Migraines     . COPD (chronic obstructive pulmonary disease) ([place])   . Esophagitis   . Emphysema     PAST SURGICAL HISTORY: Past Surgical History  Procedure Laterality Date  . Vaginal hysterectomy    . Tonsillectomy    . Cholecystectomy    . Cervical fusion    . Thumb surg    . Thyroidectomy    . Back surgery      FAMILY HISTORY: Family History  Problem Relation Age of Onset  . Adopted: Yes  . Family history unknown: Yes    SOCIAL HISTORY:  Social History   Social History  . Marital Status: Married    Spouse Name: N/A  . Number of Children: 2  . Years of Education: 14   Occupational History  . Retired    Social History Main Topics  . Smoking status: Current Every Day Smoker -- 1.00 packs/day for 35 years    Types: Cigarettes  . Smokeless tobacco: Never Used  . Alcohol Use: No  . Drug Use: No  . Sexual Activity: No     Comment: INTERCOURSE AGE 89, SEXUAL PARTNERS LESS THAN 5   Other Topics Concern  . Not on file   Social History Narrative   Lives at home with husband.   Right-handed.   1 cup caffeine daily.     PHYSICAL EXAM   Filed Vitals:   01/06/15 0809  BP: 108/67  Pulse: 76  Height: 5\' 2"  (1.575 m)  Weight: 119 lb (53.978 kg)    Not recorded      Body mass index is 21.76 kg/(m^2).  PHYSICAL EXAMNIATION:  Gen: NAD, conversant, well nourised, obese, well groomed                     Cardiovascular: Regular rate rhythm, no peripheral edema, warm, nontender. Eyes: Conjunctivae clear without exudates or hemorrhage Neck: Supple, no carotid bruise. Pulmonary: Clear to auscultation bilaterally   NEUROLOGICAL EXAM:  MENTAL STATUS: Speech:    Speech is normal; fluent and spontaneous with normal comprehension.  Cognition: Mini-Mental Status Examination is 29 out of 30 , Animal naming is 11     Orientation to time, place and person     Recent and remote memory: She missed one out of 3 recalls     Normal Attention span and concentration     Normal  Language, naming, repeating,spontaneous speech     Fund of knowledge   CRANIAL NERVES: CN II: Visual fields are full to confrontation. Fundoscopic exam is normal with sharp discs and no vascular changes. Pupils are round equal and briskly reactive to light. CN III, IV, VI: extraocular  movement are normal. No ptosis. CN V: Facial sensation is intact to pinprick in all 3 divisions bilaterally. Corneal responses are intact.  CN VII: Face is symmetric with normal eye closure and smile. CN VIII: Hearing is normal to rubbing fingers CN IX, X: Palate elevates symmetrically. Phonation is normal. CN XI: Head turning and shoulder shrug are intact CN XII: Tongue is midline with normal movements and no atrophy.  MOTOR: Mild bilateral hands postural tremor. Muscle bulk and tone are normal. Muscle strength is normal, with exception of mildly weak grip  REFLEXES: Reflexes are 3 and symmetric at the biceps, triceps, knees, and ankles. Plantar responses are extensor bilaterally  SENSORY: Intact to light touch, pinprick, position sense, and vibration sense are intact in fingers and toes.  COORDINATION: Rapid alternating movements and fine finger movements are intact. There is no dysmetria on finger-to-nose and heel-knee-shin.    GAIT/STANCE: Stiff, cautious, mildly unsteady gait   DIAGNOSTIC DATA (LABS, IMAGING, TESTING) - I reviewed patient records, labs, notes, testing and imaging myself where available.   ASSESSMENT AND PLAN  Megan Williams is a 68 y.o. female   Unsteady gait  Hyperreflexia on examination, mild bilateral hand grip weakness  Potentially localize to cervical region differentiation diagnosis includes cervical spondylitic myelopathy, proceed with MRI of cervical spine  Mild cognitive impairment  Mini-Mental Status Examination is 29 out of 30, animal naming is 11  Laboratory reviewed, no treatable cause identified,   MRI of brain   Bilateral hands tremor  Most consistent  with essential tremor     Marcial Pacas, M.D. Ph.D.  [facility] [address] [place], [place] [address] Ph: [phone] Fax: [phone]  CC: Elby Showers, MD

## 2015-01-14 ENCOUNTER — Telehealth: Payer: Self-pay | Admitting: Neurology

## 2015-01-14 NOTE — Telephone Encounter (Signed)
Pt called inquiring what time she should take 2mg  Xanax. Upon arriving??

## 2015-01-14 NOTE — Telephone Encounter (Signed)
Reviewed Xanax instructions (per MRI protocol) - she verbalized understanding.

## 2015-01-21 ENCOUNTER — Ambulatory Visit
Admission: RE | Admit: 2015-01-21 | Discharge: 2015-01-21 | Disposition: A | Discharge status: Home / Self Care | Payer: PPO | Source: Ambulatory Visit | Attending: Neurology | Admitting: Neurology

## 2015-01-21 DIAGNOSIS — G43009 Migraine without aura, not intractable, without status migrainosus: Secondary | ICD-10-CM | POA: Diagnosis not present

## 2015-01-21 DIAGNOSIS — R251 Tremor, unspecified: Secondary | ICD-10-CM

## 2015-01-21 DIAGNOSIS — R3915 Urgency of urination: Secondary | ICD-10-CM

## 2015-01-21 DIAGNOSIS — R269 Unspecified abnormalities of gait and mobility: Secondary | ICD-10-CM

## 2015-01-26 NOTE — Telephone Encounter (Addendum)
Will review result at her follow up visit in Jan 28 2015 IMPRESSION: Abnormal MRI scan of the brain showing mild changes of chronic small vessel disease and generalized cerebral atrophy. Incidental chronic changes of ethmoid and maxillary sinusitis and noted  IMPRESSION: Abnormal MRI scan of the cervical spine showing postoperative changes of anterior cervical fusion from C4-C7. At C2-3 there is broad-based central disc protrusion and ligamentum flavum hypertrophy resulting in mild canal narrowing.

## 2015-01-28 ENCOUNTER — Ambulatory Visit: Payer: PPO | Admitting: Neurology

## 2015-01-29 ENCOUNTER — Ambulatory Visit (INDEPENDENT_AMBULATORY_CARE_PROVIDER_SITE_OTHER): Payer: PPO | Admitting: Neurology

## 2015-01-29 ENCOUNTER — Encounter: Payer: Self-pay | Admitting: Neurology

## 2015-01-29 VITALS — BP 144/83 | HR 80 | Ht 62.0 in | Wt 119.0 lb

## 2015-01-29 DIAGNOSIS — M47812 Spondylosis without myelopathy or radiculopathy, cervical region: Secondary | ICD-10-CM | POA: Insufficient documentation

## 2015-01-29 DIAGNOSIS — G3184 Mild cognitive impairment, so stated: Secondary | ICD-10-CM

## 2015-01-29 DIAGNOSIS — R251 Tremor, unspecified: Secondary | ICD-10-CM

## 2015-01-29 MED ORDER — MEMANTINE HCL 10 MG PO TABS: 10.0000 mg | ORAL_TABLET | Freq: Two times a day (BID) | ORAL | Status: DC

## 2015-01-29 NOTE — Progress Notes (Signed)
Chief Complaint  Patient presents with  . Mild Cognitive Impairment    She is here with her husband, Rush Landmark. She would like to discuss her MRI results.  Her memory is unchanged from last visit on 01/06/15, where she score 29/30 on her MMSE and named 11 animals.  . Tremors    Bilateral hand tremors still present but have not worsened.      PATIENT: Megan Williams DOB: [birth year]  Chief Complaint  Patient presents with  . Mild Cognitive Impairment    She is here with her husband, Rush Landmark. She would like to discuss her MRI results.  Her memory is unchanged from last visit on 01/06/15, where she score 29/30 on her MMSE and named 11 animals.  . Tremors    Bilateral hand tremors still present but have not worsened.     HISTORICAL  Megan Williams 69 years old right-handed female, seen in refer by her primary care physician Dr. Tedra Senegal for evaluation of tremor, memory loss, and gait problems    She had a history of hypertension, depression, anxiety, used to work as a Radio broadcast assistant, and church Glass blower/designer  Around 2015, she began to notice memory trouble, she needs family to remind her multiple times, tends to forget people's name, phone number, she used to be able to remember all the congregation's name in the past, her memory trouble since 4 gradually getting worse, she still driving without getting lost  She reported history of migraine since young, for a while in September to October 2015, she has migraines almost on a daily basis, which has improved after stopped taking Trileptal  She also reported mild bilateral hands tremor since 2014, most noticeable when she holding a utensil, or write with a pencil, she also noticed mild bilateral hands weakness, in Thanksgiving 2015, she has dropped her dishes to the floor, because of bilateral hands weakness,  Around 2015, she also noticed mild stiff unsteady gait, worsening urinary urgency, she denies significant neck pain, complains of moderate low back  pain, she denies bilateral upper or lower extremity paresthesia  She is adopted, does not know family history, none of her children has tremor  UPDATE Jan 29 2015: She is with her husband at today's clinical visit, we have reviewed MRI brain film in November 2016, mild generalized atrophy, mild supratentorium small vessel disease, MRI of the cervical spine showed evidence of previous fusion from C4-7, mild canal stenosis at C 2-3 level, no evidence of cord signal changes She continue complains of change of her handwriting bilateral hands tremor, no gait difficulty, she has urinary urgency, no incontinence, no gait difficulties She complains of mild neck pain, spreading forward to become headaches,   REVIEW OF SYSTEMS: Full 14 system review of systems performed and notable only for: as above   ALLERGIES: Allergies  Allergen Reactions  . Doxycycline     Skin peeling   . Penicillins Hives    HOME MEDICATIONS: Current Outpatient Prescriptions  Medication Sig Dispense Refill  . ALPRAZolam (XANAX) 1 MG tablet Take 1 mg by mouth 2 times daily at 12 noon and 4 pm.    . buPROPion (WELLBUTRIN XL) 150 MG 24 hr tablet 150 mg.  2  . Cholecalciferol (VITAMIN D PO) Take 1 tablet by mouth daily.     . cyclobenzaprine (FLEXERIL) 10 MG tablet Take 1 tablet (10 mg total) by mouth at bedtime. 30 tablet 0  . estradiol (ESTRACE) 1 MG tablet Take 1 tablet (1 mg total)  by mouth daily. 90 tablet 4  . fexofenadine (ALLEGRA) 60 MG tablet Take 60 mg by mouth daily.      Marland Kitchen levothyroxine (SYNTHROID, LEVOTHROID) 88 MCG tablet Take 1 tablet (88 mcg total) by mouth daily. 90 tablet 0  . lisinopril (PRINIVIL,ZESTRIL) 5 MG tablet TAKE 1 TABLET (5 MG TOTAL) BY MOUTH DAILY. 90 tablet 3  . meloxicam (MOBIC) 15 MG tablet Take 15 mg by mouth daily.    . Multiple Vitamin (MULTIVITAMIN) tablet Take 1 tablet by mouth daily.    . naproxen sodium (ANAPROX) 220 MG tablet Take 220 mg by mouth 2 (two) times daily with a meal.        . Oxcarbazepine (TRILEPTAL) 300 MG tablet Take 300 mg by mouth daily. At hs    . pantoprazole (PROTONIX) 40 MG tablet Take 1 tablet (40 mg total) by mouth daily. 90 tablet 3  . predniSONE (DELTASONE) 10 MG tablet Take as directed with food 6-5-4-3-2-1 (Patient not taking: Reported on 12/26/2014) 21 tablet 0  . tolterodine (DETROL LA) 4 MG 24 hr capsule Take 4 mg by mouth daily.     . traZODone (DESYREL) 100 MG tablet Take 100 mg by mouth at bedtime. 1/2 - 1 at hs prn    . valACYclovir (VALTREX) 500 MG tablet Take 1 tablet (500 mg total) by mouth 2 (two) times daily. 20 tablet 5  . venlafaxine XR (EFFEXOR-XR) 150 MG 24 hr capsule Take 225 mg by mouth daily with breakfast.      No current facility-administered medications for this visit.    PAST MEDICAL HISTORY: Past Medical History  Diagnosis Date  . Hypertension   . Allergy   . Anxiety   . Depression   . Vitamin D deficiency   . Herpes simplex   . Osteopenia   . Migraines   . COPD (chronic obstructive pulmonary disease) ([place])   . Esophagitis   . Emphysema     PAST SURGICAL HISTORY: Past Surgical History  Procedure Laterality Date  . Vaginal hysterectomy    . Tonsillectomy    . Cholecystectomy    . Cervical fusion    . Thumb surg    . Thyroidectomy    . Back surgery      FAMILY HISTORY: Family History  Problem Relation Age of Onset  . Adopted: Yes  . Family history unknown: Yes    SOCIAL HISTORY:  Social History   Social History  . Marital Status: Married    Spouse Name: N/A  . Number of Children: 2  . Years of Education: 14   Occupational History  . Retired    Social History Main Topics  . Smoking status: Current Every Day Smoker -- 1.00 packs/day for 35 years    Types: Cigarettes  . Smokeless tobacco: Never Used  . Alcohol Use: No  . Drug Use: No  . Sexual Activity: No     Comment: INTERCOURSE AGE 43, SEXUAL PARTNERS LESS THAN 5   Other Topics Concern  . Not on file   Social History Narrative    Lives at home with husband.   Right-handed.   1 cup caffeine daily.     PHYSICAL EXAM   Filed Vitals:   01/29/15 0828  BP: 144/83  Pulse: 80  Height: 5\' 2"  (1.575 m)  Weight: 119 lb (53.978 kg)    Not recorded      Body mass index is 21.76 kg/(m^2).  PHYSICAL EXAMNIATION:  Gen: NAD, conversant, well nourised, obese, well  groomed                     Cardiovascular: Regular rate rhythm, no peripheral edema, warm, nontender. Eyes: Conjunctivae clear without exudates or hemorrhage Neck: Supple, no carotid bruise. Pulmonary: Clear to auscultation bilaterally   NEUROLOGICAL EXAM:  MENTAL STATUS: Speech:    Speech is normal; fluent and spontaneous with normal comprehension.  Cognition: Mini-Mental Status Examination is 29 out of 30 , Animal naming is 11     Orientation to time, place and person     Recent and remote memory: She missed one out of 3 recalls     Normal Attention span and concentration     Normal Language, naming, repeating,spontaneous speech     Fund of knowledge   CRANIAL NERVES: CN II: Visual fields are full to confrontation. Fundoscopic exam is normal with sharp discs and no vascular changes. Pupils are round equal and briskly reactive to light. CN III, IV, VI: extraocular movement are normal. No ptosis. CN V: Facial sensation is intact to pinprick in all 3 divisions bilaterally. Corneal responses are intact.  CN VII: Face is symmetric with normal eye closure and smile. CN VIII: Hearing is normal to rubbing fingers CN IX, X: Palate elevates symmetrically. Phonation is normal. CN XI: Head turning and shoulder shrug are intact CN XII: Tongue is midline with normal movements and no atrophy.  MOTOR: Mild bilateral hands postural tremor. Muscle bulk and tone are normal. Muscle strength is normal, with exception of mildly weak grip  REFLEXES: Reflexes are 3 and symmetric at the biceps, triceps, knees, and ankles. Plantar responses are extensor  bilaterally  SENSORY: Intact to light touch, pinprick, position sense, and vibration sense are intact in fingers and toes.  COORDINATION: Rapid alternating movements and fine finger movements are intact. There is no dysmetria on finger-to-nose and heel-knee-shin.    GAIT/STANCE: Stiff, cautious, mildly unsteady gait   DIAGNOSTIC DATA (LABS, IMAGING, TESTING) - I reviewed patient records, labs, notes, testing and imaging myself where available.   ASSESSMENT AND PLAN  YAMIRA VULLO is a 69 y.o. 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 Ph.D.  [facility] [address] [place], [place] [address] Ph: [phone] Fax: [phone]  CC: Elby Showers, MD

## 2015-02-03 ENCOUNTER — Other Ambulatory Visit: Payer: Self-pay | Admitting: Internal Medicine

## 2015-02-27 ENCOUNTER — Other Ambulatory Visit: Payer: Self-pay

## 2015-02-27 DIAGNOSIS — Z1231 Encounter for screening mammogram for malignant neoplasm of breast: Secondary | ICD-10-CM

## 2015-03-17 ENCOUNTER — Ambulatory Visit
Admission: RE | Admit: 2015-03-17 | Discharge: 2015-03-17 | Disposition: A | Discharge status: Home / Self Care | Payer: PPO | Source: Ambulatory Visit

## 2015-03-17 DIAGNOSIS — Z1231 Encounter for screening mammogram for malignant neoplasm of breast: Secondary | ICD-10-CM | POA: Diagnosis not present

## 2015-03-18 ENCOUNTER — Encounter: Payer: Self-pay | Admitting: Women's Health

## 2015-03-25 DIAGNOSIS — F331 Major depressive disorder, recurrent, moderate: Secondary | ICD-10-CM | POA: Diagnosis not present

## 2015-04-03 DIAGNOSIS — M5440 Lumbago with sciatica, unspecified side: Secondary | ICD-10-CM | POA: Diagnosis not present

## 2015-04-28 ENCOUNTER — Encounter: Payer: Self-pay | Admitting: Adult Health

## 2015-04-28 ENCOUNTER — Other Ambulatory Visit: Payer: Self-pay | Admitting: Internal Medicine

## 2015-04-28 ENCOUNTER — Ambulatory Visit (INDEPENDENT_AMBULATORY_CARE_PROVIDER_SITE_OTHER): Payer: PPO | Admitting: Adult Health

## 2015-04-28 VITALS — BP 141/80 | HR 79 | Ht 62.0 in

## 2015-04-28 DIAGNOSIS — R413 Other amnesia: Secondary | ICD-10-CM | POA: Diagnosis not present

## 2015-04-28 DIAGNOSIS — G250 Essential tremor: Secondary | ICD-10-CM | POA: Diagnosis not present

## 2015-04-28 NOTE — Patient Instructions (Signed)
Continue Namenda Memory score is stable If your symptoms worsen or you develop new symptoms please let us know.   

## 2015-04-28 NOTE — Progress Notes (Signed)
I have reviewed and agreed above plan. 

## 2015-04-28 NOTE — Progress Notes (Signed)
PATIENT: Megan Williams DOB: 1946-01-14  REASON FOR VISIT: follow up- essential tremor, mild cognitive impairment HISTORY FROM: patient  HISTORY OF PRESENT ILLNESS: UPDATE 04/27/14: Ms. Stores is a 70 year old female with a history of essential tremor and mild cognitive impairment. At the last visit she was started on Namenda. She doesn't feel that her memory has gotten any worse. She is able to complete all ADLs independently. She operates a Teacher, music without difficulty. She feels that her tremor actually improved with Namenda. She states before she felt like she had a tremor in the whole body and initially the tremor resolved with Namenda. Now it has returned to only the hands. She states that the tremor is worse with increased anxiety. Xanax does help some. Tremor affects her handwriting and when buttoning buttons. No trouble with eating. She returns today for an evaluation.  INITIAL VISIT  HISTORY Megan Williams): KOULA RUSSO 70 years old right-handed female, seen in refer by her primary care physician Dr. Tedra Senegal for evaluation of tremor, memory loss, and gait problems   She had a history of hypertension, depression, anxiety, used to work as a Radio broadcast assistant, and church Glass blower/designer  Around 2015, she began to notice memory trouble, she needs family to remind her multiple times, tends to forget people's name, phone number, she used to be able to remember all the congregation's name in the past, her memory trouble since 2 gradually getting worse, she still driving without getting lost  She reported history of migraine since young, for a while in September to October 2015, she has migraines almost on a daily basis, which has improved after stopped taking Trileptal  She also reported mild bilateral hands tremor since 2014, most noticeable when she holding a utensil, or write with a pencil, she also noticed mild bilateral hands weakness, in Thanksgiving 2015, she has dropped her dishes to the  floor, because of bilateral hands weakness,  Around 2015, she also noticed mild stiff unsteady gait, worsening urinary urgency, she denies significant neck pain, complains of moderate low back pain, she denies bilateral upper or lower extremity paresthesia  She is adopted, does not know family history, none of her children has tremor  UPDATE Jan 29 2015: She is with her husband at today's clinical visit, we have reviewed MRI brain film in November 2016, mild generalized atrophy, mild supratentorium small vessel disease, MRI of the cervical spine showed evidence of previous fusion from C4-7, mild canal stenosis at C 2-3 level, no evidence of cord signal changes She continue complains of change of her handwriting bilateral hands tremor, no gait difficulty, she has urinary urgency, no incontinence, no gait difficulties She complains of mild neck pain, spreading forward to become headaches,   REVIEW OF SYSTEMS: Out of a complete 14 system review of symptoms, the patient complains only of the following symptoms, and all other reviewed systems are negative.  Frequency of urination, joint pain, joint swelling, back pain, walking difficulty, snoring, sleep talking, insomnia, environmental allergies, cold intolerance, restless bleed easily, dizziness, headache  ALLERGIES: Allergies  Allergen Reactions  . Doxycycline     Skin peeling   . Penicillins Hives    HOME MEDICATIONS: Outpatient Prescriptions Prior to Visit  Medication Sig Dispense Refill  . ALPRAZolam (XANAX) 1 MG tablet Take 1 mg by mouth 2 (two) times daily.     Marland Kitchen buPROPion (WELLBUTRIN XL) 150 MG 24 hr tablet 150 mg daily.   2  . Cholecalciferol (VITAMIN D PO) Take  1 tablet by mouth daily.     Marland Kitchen estradiol (ESTRACE) 1 MG tablet Take 1 tablet (1 mg total) by mouth daily. 90 tablet 4  . fexofenadine (ALLEGRA) 60 MG tablet Take 60 mg by mouth daily.      Marland Kitchen levothyroxine (SYNTHROID, LEVOTHROID) 88 MCG tablet TAKE 1 TABLET (88 MCG TOTAL)  BY MOUTH DAILY. 90 tablet 0  . lisinopril (PRINIVIL,ZESTRIL) 5 MG tablet TAKE 1 TABLET (5 MG TOTAL) BY MOUTH DAILY. 90 tablet 3  . memantine (NAMENDA) 10 MG tablet Take 1 tablet (10 mg total) by mouth 2 (two) times daily. 60 tablet 11  . Multiple Vitamin (MULTIVITAMIN) tablet Take 1 tablet by mouth daily.    . naproxen sodium (ANAPROX) 220 MG tablet Take 220 mg by mouth as needed.     . pantoprazole (PROTONIX) 40 MG tablet Take 1 tablet (40 mg total) by mouth daily. (Patient taking differently: Take 40 mg by mouth as needed. ) 90 tablet 3  . traZODone (DESYREL) 100 MG tablet Take 200 mg by mouth at bedtime.     . valACYclovir (VALTREX) 500 MG tablet TAKE 1 TABLET (500 MG TOTAL) BY MOUTH 2 TIMES DAILY. 20 tablet 5  . venlafaxine XR (EFFEXOR-XR) 150 MG 24 hr capsule   2  . tolterodine (DETROL LA) 4 MG 24 hr capsule Take 4 mg by mouth daily. Reported on 04/28/2015     No facility-administered medications prior to visit.    PAST MEDICAL HISTORY: Past Medical History  Diagnosis Date  . Hypertension   . Allergy   . Anxiety   . Depression   . Vitamin D deficiency   . Herpes simplex   . Osteopenia   . Migraines   . COPD (chronic obstructive pulmonary disease) ([place])   . Esophagitis   . Emphysema     PAST SURGICAL HISTORY: Past Surgical History  Procedure Laterality Date  . Vaginal hysterectomy    . Tonsillectomy    . Cholecystectomy    . Cervical fusion    . Thumb surg    . Thyroidectomy    . Back surgery      FAMILY HISTORY: Family History  Problem Relation Age of Onset  . Adopted: Yes  . Family history unknown: Yes    SOCIAL HISTORY: Social History   Social History  . Marital Status: Married    Spouse Name: N/A  . Number of Children: 2  . Years of Education: 14   Occupational History  . Retired    Social History Main Topics  . Smoking status: Current Every Day Smoker -- 1.00 packs/day for 35 years    Types: Cigarettes  . Smokeless tobacco: Never Used  .  Alcohol Use: No  . Drug Use: No  . Sexual Activity: No     Comment: INTERCOURSE AGE 57, SEXUAL PARTNERS LESS THAN 5   Other Topics Concern  . Not on file   Social History Narrative   Lives at home with husband.   Right-handed.   1 cup caffeine daily.      PHYSICAL EXAM  Filed Vitals:   04/28/15 1523  BP: 141/80  Pulse: 79  Height: 5\' 2"  (1.575 m)   There is no weight on file to calculate BMI.  Generalized: Well developed, in no acute distress   Neurological examination  Mentation: Alert oriented to time, place, history taking. Follows all commands speech and language fluent Cranial nerve II-XII: Pupils were equal round reactive to light. Extraocular movements were full, visual  field were full on confrontational test. Facial sensation and strength were normal. Uvula tongue midline. Head turning and shoulder shrug  were normal and symmetric. Motor: The motor testing reveals 5 over 5 strength of all 4 extremities. Good symmetric motor tone is noted throughout. Mild intention tremor in hands bilaterally Sensory: Sensory testing is intact to soft touch on all 4 extremities. No evidence of extinction is noted.  Coordination: Cerebellar testing reveals good finger-nose-finger and heel-to-shin bilaterally.  Gait and station: Gait is normal. Tandem gait is unsteady. Romberg is negative. No drift is seen.  Reflexes: Deep tendon reflexes are symmetric and normal bilaterally.   DIAGNOSTIC DATA (LABS, IMAGING, TESTING) - I reviewed patient records, labs, notes, testing and imaging myself where available.  Lab Results  Component Value Date   WBC 9.9 09/24/2014   HGB 13.4 09/24/2014   HCT 39.7 09/24/2014   MCV 102.3* 09/24/2014   PLT 227 09/24/2014      Component Value Date/Time   NA 138 09/24/2014 1021   K 4.6 09/24/2014 1021   CL 101 09/24/2014 1021   CO2 26 09/24/2014 1021   GLUCOSE 97 09/24/2014 1021   BUN 13 09/24/2014 1021   CREATININE 0.86 09/24/2014 1021   CREATININE  0.74 11/19/2010 1016   CALCIUM 9.2 09/24/2014 1021   PROT 6.7 09/24/2014 1021   ALBUMIN 4.2 09/24/2014 1021   AST 17 09/24/2014 1021   ALT 10 09/24/2014 1021   ALKPHOS 44 09/24/2014 1021   BILITOT 0.6 09/24/2014 1021   GFRNONAA 70 09/24/2014 1021   GFRNONAA >60 11/19/2010 1016   GFRAA 81 09/24/2014 1021   GFRAA >60 11/19/2010 1016   Lab Results  Component Value Date   CHOL 161 09/24/2014   HDL 74 09/24/2014   LDLCALC 44 09/24/2014   TRIG 213* 09/24/2014   CHOLHDL 2.2 09/24/2014    Lab Results  Component Value Date   VITAMINB12 >2000* 12/23/2014   Lab Results  Component Value Date   TSH 2.145 12/23/2014      ASSESSMENT AND PLAN 70 y.o. year old female  has a past medical history of Hypertension; Allergy; Anxiety; Depression; Vitamin D deficiency; Herpes simplex; Osteopenia; Migraines; COPD (chronic obstructive pulmonary disease) ([place]); Esophagitis; and Emphysema. here with:  1. Essential tremor 2. Mild memory disturbance  Overall the patient has remained stable. She will continue on Namenda 10 mg twice a day. Patient MMSE is 29/30. The patient's tremor has remained stable. We will continue to monitor. Patient advised that if her symptoms worsen or she develops any new symptoms she should let us know. She will follow-up in 6 months or sooner if needed.  Ward Givens, MSN, NP-C 04/28/2015, 3:37 PM [facility] [address], [place], [place] [address] [phone]

## 2015-04-30 ENCOUNTER — Ambulatory Visit: Payer: PPO | Admitting: Adult Health

## 2015-05-09 DIAGNOSIS — M5416 Radiculopathy, lumbar region: Secondary | ICD-10-CM | POA: Diagnosis not present

## 2015-05-13 DIAGNOSIS — N3281 Overactive bladder: Secondary | ICD-10-CM | POA: Diagnosis not present

## 2015-05-13 DIAGNOSIS — R339 Retention of urine, unspecified: Secondary | ICD-10-CM | POA: Diagnosis not present

## 2015-05-20 DIAGNOSIS — K5900 Constipation, unspecified: Secondary | ICD-10-CM | POA: Diagnosis not present

## 2015-05-20 DIAGNOSIS — N3281 Overactive bladder: Secondary | ICD-10-CM | POA: Diagnosis not present

## 2015-05-27 DIAGNOSIS — F3341 Major depressive disorder, recurrent, in partial remission: Secondary | ICD-10-CM | POA: Diagnosis not present

## 2015-05-27 DIAGNOSIS — F411 Generalized anxiety disorder: Secondary | ICD-10-CM | POA: Diagnosis not present

## 2015-05-27 DIAGNOSIS — F429 Obsessive-compulsive disorder, unspecified: Secondary | ICD-10-CM | POA: Diagnosis not present

## 2015-06-02 ENCOUNTER — Other Ambulatory Visit: Payer: Self-pay | Admitting: Internal Medicine

## 2015-06-02 NOTE — Telephone Encounter (Signed)
CPE due after July 28 please book and refill until then

## 2015-06-05 ENCOUNTER — Encounter: Payer: Self-pay | Admitting: Women's Health

## 2015-06-05 ENCOUNTER — Ambulatory Visit (INDEPENDENT_AMBULATORY_CARE_PROVIDER_SITE_OTHER): Payer: PPO | Admitting: Women's Health

## 2015-06-05 VITALS — BP 122/80 | Ht 62.0 in | Wt 119.0 lb

## 2015-06-05 DIAGNOSIS — B009 Herpesviral infection, unspecified: Secondary | ICD-10-CM | POA: Diagnosis not present

## 2015-06-05 DIAGNOSIS — Z79890 Hormone replacement therapy (postmenopausal): Secondary | ICD-10-CM

## 2015-06-05 DIAGNOSIS — Z01419 Encounter for gynecological examination (general) (routine) without abnormal findings: Secondary | ICD-10-CM

## 2015-06-05 MED ORDER — ESTRADIOL 1 MG PO TABS: 1.0000 mg | ORAL_TABLET | Freq: Every day | ORAL | Status: DC

## 2015-06-05 MED ORDER — VALACYCLOVIR HCL 500 MG PO TABS: ORAL_TABLET | ORAL | Status: DC

## 2015-06-05 NOTE — Progress Notes (Addendum)
Megan Williams 1945/12/19 [id number]    History:    Presents for breast and pelvic exam. 57 TVH for abnormal Paps with normal Paps after. Estradiol 1 mg has not been able to stop due to hot flushes. Hypertension, hypothyroidism COPD managed by primary care. Process of quitting smoking without cigarette filter process. Normal mammogram history. Osteopenia T score score -2 femoral neck stable from prior DEXA. Negative colonoscopy 2013. HSV rare outbreaks has not used Valtrex in one year.   Past medical history, past surgical history, family history and social history were all reviewed and documented in the EPIC chart. Has 3 [place] terriers . 2 children both doing well.  ROS:  A ROS was performed and pertinent positives and negatives are included.  Exam:  Filed Vitals:   06/05/15 0953  BP: 122/80    General appearance:  Normal Thyroid:  Symmetrical, normal in size, without palpable masses or nodularity. Respiratory  Auscultation:  Clear without wheezing or rhonchi Cardiovascular  Auscultation:  Regular rate, without rubs, murmurs or gallops  Edema/varicosities:  Not grossly evident Abdominal  Soft,nontender, without masses, guarding or rebound.  Liver/spleen:  No organomegaly noted  Hernia:  None appreciated  Skin  Inspection:  Grossly normal   Breasts: Examined lying and sitting.     Right: Without masses, retractions, discharge or axillary adenopathy.     Left: Without masses, retractions, discharge or axillary adenopathy. Gentitourinary   Inguinal/mons:  Normal without inguinal adenopathy  External genitalia:  Normal  BUS/Urethra/Skene's glands:  Normal  Vagina:  Normal  Cervix:  Uterus absent  Adnexa/parametria:     Rt: Without masses or tenderness.   Lt: Without masses or tenderness.  Anus and perineum: Normal  Digital rectal exam: Normal sphincter tone without palpated masses or tenderness  Assessment/Plan:  70 y.o. [name] G3 P2 for breast and pelvic exam with no  complaints.  TVH on estradiol Hypertension/hyperthyroidism/COPD-primary care manages labs and meds Osteopenia/stable Smoker trying to quit HSV-rare outbreaks  Plan: Repeat DEXA December 2017. Home safety, fall prevention and importance of weightbearing exercise reviewed. SBE's, continue annual 3-D screening mammogram history of dense breasts. Estradiol 1 mg continue to decrease dose to wean off, reviewed increased risk for blood clots, strokes and breast cancer. Valtrex 500 twice daily for 3-5 days if needed prescription, proper use given and reviewed.    YOUNG,NANCY J WHNP, 1:10 PM 06/05/2015

## 2015-06-05 NOTE — Patient Instructions (Signed)

## 2015-06-11 DIAGNOSIS — H5203 Hypermetropia, bilateral: Secondary | ICD-10-CM | POA: Diagnosis not present

## 2015-06-11 DIAGNOSIS — Z961 Presence of intraocular lens: Secondary | ICD-10-CM | POA: Diagnosis not present

## 2015-06-11 DIAGNOSIS — H16223 Keratoconjunctivitis sicca, not specified as Sjogren's, bilateral: Secondary | ICD-10-CM | POA: Diagnosis not present

## 2015-06-11 DIAGNOSIS — H43813 Vitreous degeneration, bilateral: Secondary | ICD-10-CM | POA: Diagnosis not present

## 2015-07-01 DIAGNOSIS — N3281 Overactive bladder: Secondary | ICD-10-CM | POA: Diagnosis not present

## 2015-07-01 DIAGNOSIS — R351 Nocturia: Secondary | ICD-10-CM | POA: Diagnosis not present

## 2015-08-04 ENCOUNTER — Other Ambulatory Visit: Payer: Self-pay | Admitting: Internal Medicine

## 2015-08-25 DIAGNOSIS — F411 Generalized anxiety disorder: Secondary | ICD-10-CM | POA: Diagnosis not present

## 2015-08-25 DIAGNOSIS — F3341 Major depressive disorder, recurrent, in partial remission: Secondary | ICD-10-CM | POA: Diagnosis not present

## 2015-08-28 ENCOUNTER — Telehealth: Payer: Self-pay | Admitting: Internal Medicine

## 2015-08-28 NOTE — Telephone Encounter (Signed)
Needs to go to Urgent care. We do not have openings.

## 2015-08-28 NOTE — Telephone Encounter (Signed)
Patient notified

## 2015-08-28 NOTE — Telephone Encounter (Signed)
Patient states she took a terrible fall in the shower on 6/19.  She had to have her husband help her get up.  She left for [facility] the following day and had her vacation and was away for a good while.  She was not well while she was away.  She has been in terrible pain and made contact with Dr. Clarice Pole office while out of town.  She has been trying to get an appointment Dr. Ellene Route and hasn't been able to get into his office, so now she's calling here.  States that she's having difficult turning, twisting, getting up/down.   States that she needs to be seen for RIGHT hip pain/lower back pain.  Says that she was not seen after the fall and has not been x-rayed from that fall.  Says that she's in terrible pain.  Advised that you will be out of town next week.  Would you want to see her tomorrow at noon?  Or, do you want her to do something else?  Please advise.

## 2015-09-09 ENCOUNTER — Other Ambulatory Visit: Payer: Self-pay | Admitting: Internal Medicine

## 2015-09-09 ENCOUNTER — Telehealth: Payer: Self-pay | Admitting: Internal Medicine

## 2015-09-09 ENCOUNTER — Encounter: Payer: Self-pay | Admitting: Internal Medicine

## 2015-09-09 ENCOUNTER — Ambulatory Visit
Admission: RE | Admit: 2015-09-09 | Discharge: 2015-09-09 | Disposition: A | Discharge status: Home / Self Care | Payer: PPO | Source: Ambulatory Visit | Attending: Internal Medicine | Admitting: Internal Medicine

## 2015-09-09 ENCOUNTER — Ambulatory Visit (INDEPENDENT_AMBULATORY_CARE_PROVIDER_SITE_OTHER): Payer: PPO | Admitting: Internal Medicine

## 2015-09-09 VITALS — BP 106/64 | HR 64 | Temp 97.3°F | Resp 18 | Ht 62.0 in | Wt 117.5 lb

## 2015-09-09 DIAGNOSIS — M545 Low back pain, unspecified: Secondary | ICD-10-CM

## 2015-09-09 DIAGNOSIS — S3992XA Unspecified injury of lower back, initial encounter: Secondary | ICD-10-CM | POA: Diagnosis not present

## 2015-09-09 DIAGNOSIS — S3993XA Unspecified injury of pelvis, initial encounter: Secondary | ICD-10-CM | POA: Diagnosis not present

## 2015-09-09 DIAGNOSIS — M25559 Pain in unspecified hip: Secondary | ICD-10-CM | POA: Diagnosis not present

## 2015-09-09 MED ORDER — CYCLOBENZAPRINE HCL 10 MG PO TABS: ORAL_TABLET | ORAL | Status: DC

## 2015-09-09 NOTE — Patient Instructions (Addendum)
2 Aleve twice a day. Flexeril at bedtime. Have Xrays done today.

## 2015-09-09 NOTE — Progress Notes (Signed)
   Subjective:    Patient ID: Megan Williams, female    DOB: 05-May-1945, 70 y.o.   MRN: [id number]  HPI  [place] in shower June 19th. Says shower was slippery. She somehow fell on her buttocks striking a rail on the shower door. Had to get husband to help her out of the shower. Was not seen immediately for evaluation. Later went to the beach. Just now getting checked out. Says Dr. Ellene Route could not see her. Says she still having a lot of lower back pain. She's been taking Aleve only once daily. Having some pain in her bilateral pelvic area is well. Not constipated. Able to ambulate. Able to drive. Denies neck pain. No paresthesias of lower extremities. No hip pain. Doesn't recall any contusions that were visible. Did not strike her head.   Review of Systems as above     Objective:   Physical Exam   Straight leg raising is negative at 90 bilaterally. Muscle strength is 5 over 5 in the lower extremities. No evidence of bruising in the lumbosacral area or abdomen. Abdominal exam no hepatosplenomegaly masses or significant tenderness. No pain with internal or external rotation of either hip. She has some tenderness over the posterior superior iliac spines bilaterally.      Assessment & Plan:  Musculoskeletal pain secondary to fall in shower  Plan: Perform LS-spine x-ray and pelvic x-ray. Prescribed Flexeril 10 mg one half tablet at bedtime. Increase Aleve to 2 tablets twice daily. She plans to drive to [place] later this month which is a 13 Hour drive. She certainly should break it up into 2 maybe 3 days and make frequent stops. We discussed physical therapy but she's hardly going to have time to get enrolled in therapy before leaving for vacation July 21.

## 2015-09-09 NOTE — Telephone Encounter (Signed)
Stat Report from [facility] Anterior wedging at T12 body; not present on most recent study.  No other fracture.   They will fax the official report (also in EPIC).

## 2015-09-11 ENCOUNTER — Other Ambulatory Visit: Payer: Self-pay | Admitting: Internal Medicine

## 2015-09-19 DIAGNOSIS — Y999 Unspecified external cause status: Secondary | ICD-10-CM | POA: Diagnosis not present

## 2015-09-19 DIAGNOSIS — Y939 Activity, unspecified: Secondary | ICD-10-CM | POA: Diagnosis not present

## 2015-09-19 DIAGNOSIS — S22080A Wedge compression fracture of T11-T12 vertebra, initial encounter for closed fracture: Secondary | ICD-10-CM | POA: Diagnosis not present

## 2015-09-19 DIAGNOSIS — W19XXXA Unspecified fall, initial encounter: Secondary | ICD-10-CM | POA: Diagnosis not present

## 2015-09-19 DIAGNOSIS — Y929 Unspecified place or not applicable: Secondary | ICD-10-CM | POA: Diagnosis not present

## 2015-09-19 DIAGNOSIS — M4854XA Collapsed vertebra, not elsewhere classified, thoracic region, initial encounter for fracture: Secondary | ICD-10-CM | POA: Diagnosis not present

## 2015-09-19 HISTORY — PX: KYPHOPLASTY: SHX5884

## 2015-09-25 ENCOUNTER — Other Ambulatory Visit: Payer: PPO | Admitting: Internal Medicine

## 2015-09-29 ENCOUNTER — Encounter: Payer: PPO | Admitting: Internal Medicine

## 2015-10-06 ENCOUNTER — Other Ambulatory Visit: Payer: PPO | Admitting: Internal Medicine

## 2015-10-06 ENCOUNTER — Other Ambulatory Visit: Payer: Self-pay | Admitting: Internal Medicine

## 2015-10-06 DIAGNOSIS — I10 Essential (primary) hypertension: Secondary | ICD-10-CM

## 2015-10-06 DIAGNOSIS — E785 Hyperlipidemia, unspecified: Secondary | ICD-10-CM | POA: Diagnosis not present

## 2015-10-06 DIAGNOSIS — M8580 Other specified disorders of bone density and structure, unspecified site: Secondary | ICD-10-CM | POA: Diagnosis not present

## 2015-10-06 DIAGNOSIS — E039 Hypothyroidism, unspecified: Secondary | ICD-10-CM

## 2015-10-06 DIAGNOSIS — E538 Deficiency of other specified B group vitamins: Secondary | ICD-10-CM | POA: Diagnosis not present

## 2015-10-06 LAB — TSH: TSH: 0.72 mIU/L

## 2015-10-06 LAB — CBC WITH DIFFERENTIAL/PLATELET
Basophils Absolute: 0 cells/uL (ref 0–200)
Basophils Relative: 0 %
Eosinophils Absolute: 61 cells/uL (ref 15–500)
Eosinophils Relative: 1 %
HCT: 40.3 % (ref 35.0–45.0)
Hemoglobin: 13.2 g/dL (ref 11.7–15.5)
Lymphocytes Relative: 32 %
Lymphs Abs: 1952 cells/uL (ref 850–3900)
MCH: 33.4 pg — ABNORMAL HIGH (ref 27.0–33.0)
MCHC: 32.8 g/dL (ref 32.0–36.0)
MCV: 102 fL — ABNORMAL HIGH (ref 80.0–100.0)
MPV: 9.4 fL (ref 7.5–12.5)
Monocytes Absolute: 305 cells/uL (ref 200–950)
Monocytes Relative: 5 %
Neutro Abs: 3782 cells/uL (ref 1500–7800)
Neutrophils Relative %: 62 %
Platelets: 213 10*3/uL (ref 140–400)
RBC: 3.95 MIL/uL (ref 3.80–5.10)
RDW: 13.2 % (ref 11.0–15.0)
WBC: 6.1 10*3/uL (ref 3.8–10.8)

## 2015-10-06 LAB — LIPID PANEL
Cholesterol: 188 mg/dL (ref 125–200)
HDL: 71 mg/dL (ref >=46)
LDL Cholesterol: 91 mg/dL (ref <130)
Total CHOL/HDL Ratio: 2.6 Ratio (ref <=5.0)
Triglycerides: 129 mg/dL (ref <150)
VLDL: 26 mg/dL (ref <30)

## 2015-10-06 LAB — COMPLETE METABOLIC PANEL WITH GFR
ALT: 11 U/L (ref 6–29)
AST: 15 U/L (ref 10–35)
Albumin: 4 g/dL (ref 3.6–5.1)
Alkaline Phosphatase: 61 U/L (ref 33–130)
BUN: 10 mg/dL (ref 7–25)
CO2: 29 mmol/L (ref 20–31)
Calcium: 9.4 mg/dL (ref 8.6–10.4)
Chloride: 104 mmol/L (ref 98–110)
Creat: 0.89 mg/dL (ref 0.50–0.99)
GFR, Est African American: 77 mL/min (ref >=60)
GFR, Est Non African American: 67 mL/min (ref >=60)
Glucose, Bld: 102 mg/dL — ABNORMAL HIGH (ref 65–99)
Potassium: 4.5 mmol/L (ref 3.5–5.3)
Sodium: 141 mmol/L (ref 135–146)
Total Bilirubin: 0.3 mg/dL (ref 0.2–1.2)
Total Protein: 6.3 g/dL (ref 6.1–8.1)

## 2015-10-07 LAB — VITAMIN D 25 HYDROXY (VIT D DEFICIENCY, FRACTURES): Vit D, 25-Hydroxy: 58 ng/mL (ref 30–100)

## 2015-10-08 DIAGNOSIS — S22080A Wedge compression fracture of T11-T12 vertebra, initial encounter for closed fracture: Secondary | ICD-10-CM | POA: Diagnosis not present

## 2015-10-09 ENCOUNTER — Ambulatory Visit (INDEPENDENT_AMBULATORY_CARE_PROVIDER_SITE_OTHER): Payer: PPO | Admitting: Internal Medicine

## 2015-10-09 ENCOUNTER — Encounter: Payer: Self-pay | Admitting: Internal Medicine

## 2015-10-09 VITALS — BP 116/64 | HR 87 | Temp 98.3°F | Ht 62.0 in | Wt 118.0 lb

## 2015-10-09 DIAGNOSIS — M8580 Other specified disorders of bone density and structure, unspecified site: Secondary | ICD-10-CM

## 2015-10-09 DIAGNOSIS — E785 Hyperlipidemia, unspecified: Secondary | ICD-10-CM

## 2015-10-09 DIAGNOSIS — F419 Anxiety disorder, unspecified: Secondary | ICD-10-CM

## 2015-10-09 DIAGNOSIS — F418 Other specified anxiety disorders: Secondary | ICD-10-CM

## 2015-10-09 DIAGNOSIS — Z9889 Other specified postprocedural states: Secondary | ICD-10-CM

## 2015-10-09 DIAGNOSIS — G250 Essential tremor: Secondary | ICD-10-CM | POA: Diagnosis not present

## 2015-10-09 DIAGNOSIS — Z720 Tobacco use: Secondary | ICD-10-CM | POA: Diagnosis not present

## 2015-10-09 DIAGNOSIS — E039 Hypothyroidism, unspecified: Secondary | ICD-10-CM

## 2015-10-09 DIAGNOSIS — S22089G Unspecified fracture of T11-T12 vertebra, subsequent encounter for fracture with delayed healing: Secondary | ICD-10-CM

## 2015-10-09 DIAGNOSIS — Z0000 Encounter for general adult medical examination without abnormal findings: Secondary | ICD-10-CM

## 2015-10-09 DIAGNOSIS — E890 Postprocedural hypothyroidism: Secondary | ICD-10-CM | POA: Diagnosis not present

## 2015-10-09 DIAGNOSIS — Z87891 Personal history of nicotine dependence: Secondary | ICD-10-CM

## 2015-10-09 DIAGNOSIS — F329 Major depressive disorder, single episode, unspecified: Secondary | ICD-10-CM

## 2015-10-09 DIAGNOSIS — F32A Depression, unspecified: Secondary | ICD-10-CM

## 2015-10-09 LAB — POCT URINALYSIS DIPSTICK
Bilirubin, UA: NEGATIVE
Blood, UA: NEGATIVE
Glucose, UA: NEGATIVE
Ketones, UA: NEGATIVE
Leukocytes, UA: NEGATIVE
Nitrite, UA: NEGATIVE
Protein, UA: NEGATIVE
Spec Grav, UA: 1.01
Urobilinogen, UA: 0.2
pH, UA: 6

## 2015-10-09 LAB — VITAMIN B12: Vitamin B-12: 588 pg/mL (ref 200–1100)

## 2015-10-09 NOTE — Progress Notes (Signed)
Subjective:    Patient ID: Megan Williams, female    DOB: [birth year], 70 y.o.   MRN: [id number]  HPI  70 year old Female for health maintenance exam and evaluation of multiple medical issues. Her blood pressure was running low a while back and she stopped taking lisinopril. Says blood pressures been fine at home. She seeing Dr. Ellene Route for T12 compression fracture and has an MRI scheduled in the near future. Back pain continues to bother her. Slowly improving.  She has not had bone density study since 2015 so that will be ordered.  Sees Elon Alas nurse practitioner for GYN care.  Continues to smoke about a pack every 3 days.  Has been diagnosed with a tremor by neurologist and mild cognitive impairment. Patient says that she doesn't feel she is having memory issues if she can just stop gather her thoughts and think. She does seem a bit forgetful to me. Does have history of anxiety depression. History of hypothyroidism but TSH is within normal limits.  About a year ago she had low B-12 level in the low 200 range that improved with oral supplementation. This will be checked again.    Review of Systems see above     Objective:   Physical Exam  Constitutional: She is oriented to person, place, and time. She appears well-developed and well-nourished. No distress.  HENT:  Head: Normocephalic and atraumatic.  Right Ear: External ear normal.  Left Ear: External ear normal.  Mouth/Throat: Oropharynx is clear and moist.  Eyes: Conjunctivae and EOM are normal. Pupils are equal, round, and reactive to light. Right eye exhibits no discharge. Left eye exhibits no discharge.  Neck: Neck supple. No thyromegaly present.  Cardiovascular: Normal rate, regular rhythm, normal heart sounds and intact distal pulses.   No murmur heard. Pulmonary/Chest: Breath sounds normal. No respiratory distress. She has no wheezes. She has no rales. She exhibits no tenderness.  Breasts normal female  Abdominal: Soft.  Bowel sounds are normal. She exhibits no distension and no mass. There is no tenderness. There is no rebound and no guarding.  Genitourinary:  Genitourinary Comments: Deferred to GYN  Musculoskeletal: She exhibits no edema.  Lymphadenopathy:    She has no cervical adenopathy.  Neurological: She is alert and oriented to person, place, and time. She has normal reflexes.  Bilateral essential tremor  Skin: Skin is warm and dry. No rash noted. She is not diaphoretic.  Psychiatric: She has a normal mood and affect. Her behavior is normal. Judgment and thought content normal.  Vitals reviewed.         Assessment & Plan:  T12 wedge fracture Osteopenia Hypothyroidism Screen for Hep C at patient request Anxiety depression Hyperlipidemia History of smoking Essential HTN Hx of migraine headaches COPD Mild cognitive impairment Essential tremor bilateral GERD  Expect T12 wedge fracture to heal in the next few weeks. Continue same medications and return in 6 months.  Subjective:   Patient presents for Medicare Annual/Subsequent preventive examination.  Review Past Medical/Family/Social:   Risk Factors  Current exercise habits: Walks Dietary issues discussed: Low fat low carb  Cardiac risk factors:Smoking  Depression Screen  (Note: if answer to either of the following is "Yes", a more complete depression screening is indicated)   Over the past two weeks, have you felt down, depressed or hopeless? No  Over the past two weeks, have you felt little interest or pleasure in doing things? No Have you lost interest or pleasure in daily life?  No Do you often feel hopeless? No Do you cry easily over simple problems? No   Activities of Daily Living  In your present state of health, do you have any difficulty performing the following activities?:   Driving? No  Managing money? No  Feeding yourself? No  Getting from bed to chair? No  Climbing a flight of stairs? No  Preparing food  and eating?: No  Bathing or showering? No  Getting dressed: No  Getting to the toilet? No  Using the toilet:No  Moving around from place to place: No  In the past year have you fallen or had a near fall?: Yes fell in showering June Are you sexually active? No  Do you have more than one partner? No   Hearing Difficulties: No  Do you often ask people to speak up or repeat themselves? No  Do you experience ringing or noises in your ears? No  Do you have difficulty understanding soft or whispered voices? No  Do you feel that you have a problem with memory? No Do you often misplace items? No    Home Safety:  Do you have a smoke alarm at your residence? Yes Do you have grab bars in the bathroom?No Do you have throw rugs in your house? Yes   Cognitive Testing  Alert? Yes Normal Appearance?Yes  Oriented to person? Yes Place? Yes  Time? Yes  Recall of three objects? Yes  Can perform simple calculations? Yes  Displays appropriate judgment?Yes  Can read the correct time from a watch face?Yes   List the Names of Other Physician/Practitioners you currently use:  See referral list for the physicians patient is currently seeing.  Dr. Ellene Route   Review of Systems: See above   Objective:     General appearance: Appears stated age Head: Normocephalic, without obvious abnormality, atraumatic  Eyes: conj clear, EOMi PEERLA  Ears: normal TM's and external ear canals both ears  Nose: Nares normal. Septum midline. Mucosa normal. No drainage or sinus tenderness.  Throat: lips, mucosa, and tongue normal; teeth and gums normal  Neck: no adenopathy, no carotid bruit, no JVD, supple, symmetrical, trachea midline and thyroid not enlarged, symmetric, no tenderness/mass/nodules  No CVA tenderness.  Lungs: clear to auscultation bilaterally  Breasts: normal appearance, no masses or tenderness,  Heart: regular rate and rhythm, S1, S2 normal, no murmur, click, rub or gallop  Abdomen: soft,  non-tender; bowel sounds normal; no masses, no organomegaly  Musculoskeletal: ROM normal in all joints, no crepitus, no deformity, Normal muscle strengthen. Back  is symmetric, no curvature. Skin: Skin color, texture, turgor normal. No rashes or lesions  Lymph nodes: Cervical, supraclavicular, and axillary nodes normal.  Neurologic: CN 2 -12 Normal, Normal symmetric reflexes. Normal coordination and gait  Psych: Alert & Oriented x 3, Mood appear stable.    Assessment:    Annual wellness medicare exam   Plan:    During the course of the visit the patient was educated and counseled about appropriate screening and preventive services including:   Annual flu vaccine     Patient Instructions (the written plan) was given to the patient.  Medicare Attestation  I have personally reviewed:  The patient's medical and social history  Their use of alcohol, tobacco or illicit drugs  Their current medications and supplements  The patient's functional ability including ADLs,fall risks, home safety risks, cognitive, and hearing and visual impairment  Diet and physical activities  Evidence for depression or mood disorders  The patient's weight,  height, BMI, and visual acuity have been recorded in the chart. I have made referrals, counseling, and provided education to the patient based on review of the above and I have provided the patient with a written personalized care plan for preventive services.

## 2015-10-10 ENCOUNTER — Telehealth: Payer: Self-pay

## 2015-10-10 NOTE — Telephone Encounter (Signed)
Called patient to give lab results. No answer. Left vmail.   

## 2015-10-10 NOTE — Telephone Encounter (Signed)
-----   Message from Elby Showers, MD sent at 10/10/2015  9:50 AM EDT ----- Please call her B12 level is normal

## 2015-10-16 DIAGNOSIS — M4806 Spinal stenosis, lumbar region: Secondary | ICD-10-CM | POA: Diagnosis not present

## 2015-10-16 DIAGNOSIS — M5126 Other intervertebral disc displacement, lumbar region: Secondary | ICD-10-CM | POA: Diagnosis not present

## 2015-10-27 ENCOUNTER — Encounter: Payer: Self-pay | Admitting: Adult Health

## 2015-10-27 ENCOUNTER — Ambulatory Visit (INDEPENDENT_AMBULATORY_CARE_PROVIDER_SITE_OTHER): Payer: PPO | Admitting: Adult Health

## 2015-10-27 VITALS — BP 128/78 | HR 84 | Ht 62.0 in | Wt 119.8 lb

## 2015-10-27 DIAGNOSIS — G3184 Mild cognitive impairment, so stated: Secondary | ICD-10-CM

## 2015-10-27 DIAGNOSIS — G250 Essential tremor: Secondary | ICD-10-CM

## 2015-10-27 NOTE — Progress Notes (Signed)
PATIENT: Megan Williams DOB: 10-Feb-1946  REASON FOR VISIT: follow up HISTORY FROM: patient  HISTORY OF PRESENT ILLNESS:   INITIAL VISIT  HISTORY Megan Williams): Megan Williams 70 years old right-handed female, seen in refer by her primary care physician Dr. Tedra Senegal for evaluation of tremor, memory loss, and gait problems   She had a history of hypertension, depression, anxiety, used to work as a Radio broadcast assistant, and church Glass blower/designer  Around 2015, she began to notice memory trouble, she needs family to remind her multiple times, tends to forget people's name, phone number, she used to be able to remember all the congregation's name in the past, her memory trouble since 4 gradually getting worse, she still driving without getting lost  She reported history of migraine since young, for a while in September to October 2015, she has migraines almost on a daily basis, which has improved after stopped taking Trileptal  She also reported mild bilateral hands tremor since 2014, most noticeable when she holding a utensil, or write with a pencil, she also noticed mild bilateral hands weakness, in Thanksgiving 2015, she has dropped her dishes to the floor, because of bilateral hands weakness,  Around 2015, she also noticed mild stiff unsteady gait, worsening urinary urgency, she denies significant neck pain, complains of moderate low back pain, she denies bilateral upper or lower extremity paresthesia  She is adopted, does not know family history, none of her children has tremor  UPDATE Jan 29 2015: She is with her husband at today's clinical visit, we have reviewed MRI brain film in November 2016, mild generalized atrophy, mild supratentorium small vessel disease, MRI of the cervical spine showed evidence of previous fusion from C4-7, mild canal stenosis at C 2-3 level, no evidence of cord signal changes She continue complains of change of her handwriting bilateral hands tremor, no gait  difficulty, she has urinary urgency, no incontinence, no gait difficulties She complains of mild neck pain, spreading forward to become headaches,   UPDATE 04/27/14: Megan Williams is a 70 year old female with a history of essential tremor and mild cognitive impairment. At the last visit she was started on Namenda. She doesn't feel that her memory has gotten any worse. She is able to complete all ADLs independently. She operates a Teacher, music without difficulty. She feels that her tremor actually improved with Namenda. She states before she felt like she had a tremor in the whole body and initially the tremor resolved with Namenda. Now it has returned to only the hands. She states that the tremor is worse with increased anxiety. Xanax does help some. Tremor affects her handwriting and when buttoning buttons. No trouble with eating. She returns today for an evaluation.  Today 10/27/2015:  Megan Williams is a 70 year old female with a history of essential tremor and mild cognitive impairment. She returns today for follow-up. She remains on Namenda and is tolerating it well. She denies any changes in her memory. She is able to complete all ADLs independently. She operates a Teacher, music without difficulty. She is able to prepare her  meals and handle her finances without difficulty. She feels that her tremor has remained stable as well. She states that it does get worse with anxiety. She states that she can take Xanax and finds it beneficial. Denies any new medical issues. He returns today for an evaluation.   REVIEW OF SYSTEMS: Out of a complete 14 system review of symptoms, the patient complains only of the following symptoms,  and all other reviewed systems are negative.  Cold intolerance, excessive thirst, runny nose, joint pain, joint swelling, back pain, walking difficulty, insomnia, sleep talking, sleep walking, depression, nervous/anxious, memory loss  ALLERGIES: Allergies  Allergen Reactions  .  Doxycycline     Skin peeling   . Penicillins Hives  . Latex Rash    HOME MEDICATIONS: Outpatient Medications Prior to Visit  Medication Sig Dispense Refill  . ALPRAZolam (XANAX) 1 MG tablet Take 1 mg by mouth 2 (two) times daily as needed.     Marland Kitchen buPROPion (WELLBUTRIN XL) 300 MG 24 hr tablet Take 300 mg by mouth daily.     . Cholecalciferol (VITAMIN D PO) Take 1 tablet by mouth daily. 2000 units    . Cyanocobalamin (RA VITAMIN B-12 TR) 1000 MCG TBCR Take 1 tablet by mouth daily.    Marland Kitchen estradiol (ESTRACE) 1 MG tablet Take 1 tablet (1 mg total) by mouth daily. 90 tablet 4  . fexofenadine (ALLEGRA) 60 MG tablet Take 60 mg by mouth daily.      Marland Kitchen levothyroxine (SYNTHROID, LEVOTHROID) 88 MCG tablet TAKE 1 TABLET (88 MCG TOTAL) BY MOUTH DAILY. 90 tablet 0  . MAGNESIUM PO Take 1 tablet by mouth daily. 250mg     . memantine (NAMENDA) 10 MG tablet Take 1 tablet (10 mg total) by mouth 2 (two) times daily. 60 tablet 11  . Multiple Vitamin (MULTIVITAMIN) tablet Take 1 tablet by mouth daily.    . naproxen sodium (ANAPROX) 220 MG tablet Take 220 mg by mouth as needed.     . pantoprazole (PROTONIX) 40 MG tablet Take 1 tablet (40 mg total) by mouth daily. (Patient taking differently: Take 40 mg by mouth as needed. ) 90 tablet 3  . traZODone (DESYREL) 100 MG tablet Take 200 mg by mouth at bedtime.     Marland Kitchen venlafaxine XR (EFFEXOR-XR) 150 MG 24 hr capsule Take 300 mg by mouth every morning.   2  . valACYclovir (VALTREX) 500 MG tablet Take twice daily for 3-5 days (Patient not taking: Reported on 10/27/2015) 30 tablet 6  . cyclobenzaprine (FLEXERIL) 10 MG tablet One half tablet at bedtime for musculoskeletal pain (Patient not taking: Reported on 10/09/2015) 30 tablet 0  . lisinopril (PRINIVIL,ZESTRIL) 5 MG tablet TAKE 1 TABLET BY MOUTH DAILY. NEED APPOINTMENT FOR PHYSICAL\(Patient not taking: Reported on 10/27/2015) 90 tablet 0   No facility-administered medications prior to visit.     PAST MEDICAL HISTORY: Past  Medical History:  Diagnosis Date  . Allergy   . Anxiety   . COPD (chronic obstructive pulmonary disease) ([place])   . Depression   . Emphysema   . Esophagitis   . Herpes simplex   . Hypertension   . Migraines   . Osteopenia   . Vitamin D deficiency     PAST SURGICAL HISTORY: Past Surgical History:  Procedure Laterality Date  . BACK SURGERY    . CERVICAL FUSION    . CHOLECYSTECTOMY    . KYPHOPLASTY  09/19/2015   Dr. Ellene Route  T12  . Thumb surg    . THYROIDECTOMY    . TONSILLECTOMY    . VAGINAL HYSTERECTOMY      FAMILY HISTORY: Family History  Problem Relation Age of Onset  . Adopted: Yes  . Family history unknown: Yes    SOCIAL HISTORY: Social History   Social History  . Marital status: Married    Spouse name: N/A  . Number of children: 2  . Years of education: 58  Occupational History  . Retired    Social History Main Topics  . Smoking status: Current Every Day Smoker    Packs/day: 1.00    Years: 35.00    Types: Cigarettes  . Smokeless tobacco: Never Used  . Alcohol use No  . Drug use: No  . Sexual activity: No     Comment: INTERCOURSE AGE 18, SEXUAL PARTNERS LESS THAN 5   Other Topics Concern  . Not on file   Social History Narrative   Lives at home with husband.   Right-handed.   1 cup caffeine daily.      PHYSICAL EXAM  Vitals:   10/27/15 0943  BP: 128/78  Pulse: 84  Weight: 119 lb 12.8 oz (54.3 kg)  Height: 5\' 2"  (1.575 m)   Body mass index is 21.91 kg/m.  MMSE - Mini Mental State Exam 10/27/2015 04/28/2015 01/06/2015  Orientation to time 5 5 5   Orientation to Place 5 5 5   Registration 3 3 3   Attention/ Calculation 5 5 5   Recall 2 3 2   Language- name 2 objects 2 2 2   Language- repeat 1 1 1   Language- follow 3 step command 3 2 3   Language- read & follow direction 1 1 1   Write a sentence 1 1 1   Copy design 1 1 1   Total score 29 29 29      Generalized: Well developed, in no acute distress   Neurological examination    Mentation: Alert oriented to time, place, history taking. Follows all commands speech and language fluent Cranial nerve II-XII: Pupils were equal round reactive to light. Extraocular movements were full, visual field were full on confrontational test. Facial sensation and strength were normal. Uvula tongue midline. Head turning and shoulder shrug  were normal and symmetric. Motor: The motor testing reveals 5 over 5 strength of all 4 extremities. Good symmetric motor tone is noted throughout.  Sensory: Sensory testing is intact to soft touch on all 4 extremities. No evidence of extinction is noted.  Coordination: Cerebellar testing reveals good finger-nose-finger and heel-to-shin bilaterally.  Gait and station: Gait is normal. Tandem gait is Unsteady. Romberg is negative. No drift is seen.  Reflexes: Deep tendon reflexes are symmetric and normal bilaterally.   DIAGNOSTIC DATA (LABS, IMAGING, TESTING) - I reviewed patient records, labs, notes, testing and imaging myself where available.  Lab Results  Component Value Date   WBC 6.1 10/06/2015   HGB 13.2 10/06/2015   HCT 40.3 10/06/2015   MCV 102.0 (H) 10/06/2015   PLT 213 10/06/2015      Component Value Date/Time   NA 141 10/06/2015 1119   K 4.5 10/06/2015 1119   CL 104 10/06/2015 1119   CO2 29 10/06/2015 1119   GLUCOSE 102 (H) 10/06/2015 1119   BUN 10 10/06/2015 1119   CREATININE 0.89 10/06/2015 1119   CALCIUM 9.4 10/06/2015 1119   PROT 6.3 10/06/2015 1119   ALBUMIN 4.0 10/06/2015 1119   AST 15 10/06/2015 1119   ALT 11 10/06/2015 1119   ALKPHOS 61 10/06/2015 1119   BILITOT 0.3 10/06/2015 1119   GFRNONAA 67 10/06/2015 1119   GFRAA 77 10/06/2015 1119   Lab Results  Component Value Date   CHOL 188 10/06/2015   HDL 71 10/06/2015   LDLCALC 91 10/06/2015   TRIG 129 10/06/2015   CHOLHDL 2.6 10/06/2015    Lab Results  Component Value Date   VITAMINB12 588 10/06/2015   Lab Results  Component Value Date   TSH 0.72  10/06/2015      ASSESSMENT AND PLAN 70 y.o. year old female  has a past medical history of Allergy; Anxiety; COPD (chronic obstructive pulmonary disease) ([place]); Depression; Emphysema; Esophagitis; Herpes simplex; Hypertension; Migraines; Osteopenia; and Vitamin D deficiency. here with:  1. Essential tremor 2. Mild cognitive impairment  The patient's tremor has remained stable. Her memory score is also stable. She will remain on Namenda. The patient is questioning if she can ever come off of [place]. I advised that if her memory score remained stable we could give her a trial off medication. Although the patient is worried that her memory score May decline. Neuropsychological testing may also be an option. Patient advised that if her symptoms worsen or she develops any new symptoms she should let us know. Follow-up in 6 months or sooner if needed.     Ward Givens, MSN, NP-C 10/27/2015, 9:58 AM [facility] [address] [place], [place] [address] [phone]

## 2015-10-27 NOTE — Patient Instructions (Signed)
Memory score is stable Continue Namenda If tremor worsens please let us know.

## 2015-10-30 NOTE — Patient Instructions (Signed)
Expect T12 wedge fracture to heal in the next few weeks. Continue same medications and return in 6 months.

## 2015-10-31 DIAGNOSIS — M4806 Spinal stenosis, lumbar region: Secondary | ICD-10-CM | POA: Diagnosis not present

## 2015-10-31 DIAGNOSIS — M5136 Other intervertebral disc degeneration, lumbar region: Secondary | ICD-10-CM | POA: Diagnosis not present

## 2015-10-31 DIAGNOSIS — M5416 Radiculopathy, lumbar region: Secondary | ICD-10-CM | POA: Diagnosis not present

## 2015-10-31 DIAGNOSIS — M4726 Other spondylosis with radiculopathy, lumbar region: Secondary | ICD-10-CM | POA: Diagnosis not present

## 2015-11-10 DIAGNOSIS — N3281 Overactive bladder: Secondary | ICD-10-CM | POA: Diagnosis not present

## 2015-11-10 DIAGNOSIS — R351 Nocturia: Secondary | ICD-10-CM | POA: Diagnosis not present

## 2015-12-01 ENCOUNTER — Encounter: Payer: Self-pay | Admitting: Internal Medicine

## 2015-12-01 ENCOUNTER — Ambulatory Visit (INDEPENDENT_AMBULATORY_CARE_PROVIDER_SITE_OTHER): Payer: PPO | Admitting: Internal Medicine

## 2015-12-01 VITALS — BP 130/80 | HR 79 | Temp 98.0°F | Wt 120.5 lb

## 2015-12-01 DIAGNOSIS — S40022A Contusion of left upper arm, initial encounter: Secondary | ICD-10-CM | POA: Diagnosis not present

## 2015-12-01 DIAGNOSIS — Z0000 Encounter for general adult medical examination without abnormal findings: Secondary | ICD-10-CM | POA: Diagnosis not present

## 2015-12-01 DIAGNOSIS — Z23 Encounter for immunization: Secondary | ICD-10-CM

## 2015-12-01 MED ORDER — VARENICLINE TARTRATE 0.5 MG PO TABS: 0.5000 mg | ORAL_TABLET | Freq: Two times a day (BID) | ORAL | 0 refills | Status: DC

## 2015-12-02 LAB — HEPATITIS C ANTIBODY: HCV Ab: NEGATIVE

## 2015-12-02 NOTE — Progress Notes (Signed)
   Subjective:    Patient ID: Megan Williams, female    DOB: 01/01/1946, 70 y.o.   MRN: [id number]  HPI  Patient had fall in [facility] going down some steps. Struck her left arm. The gentleman picked her up and walked her to her airline gait. This is her second fall within sure. At time. She is concerned about a lump left medial olecranon fossa.  She was not seen by physician until today. Accident occurred mid-September.   Review of Systems as above     Objective:   Physical Exam She has 3 cm x 4 cm hematoma left medial olecranon fossa. It is soft touch and only very slightly tender. Good range of motion of the left elbow joint.       Assessment & Plan:  Hematoma left olecranon fossa  Plan: Patient reassured. I do not feel that she needs x-ray at present time. Flu vaccine given. Patient requests testing for hepatitis C.

## 2015-12-02 NOTE — Patient Instructions (Signed)
Hepatitis C antibody drawn. Continue to monitor hematoma left arm. Return as needed. Flu vaccine given.

## 2015-12-29 ENCOUNTER — Other Ambulatory Visit: Payer: Self-pay | Admitting: Internal Medicine

## 2016-01-23 ENCOUNTER — Other Ambulatory Visit: Payer: Self-pay | Admitting: Neurology

## 2016-01-26 ENCOUNTER — Other Ambulatory Visit: Payer: Self-pay | Admitting: Internal Medicine

## 2016-01-26 DIAGNOSIS — F411 Generalized anxiety disorder: Secondary | ICD-10-CM | POA: Diagnosis not present

## 2016-01-26 DIAGNOSIS — G3184 Mild cognitive impairment, so stated: Secondary | ICD-10-CM | POA: Diagnosis not present

## 2016-01-26 DIAGNOSIS — F3341 Major depressive disorder, recurrent, in partial remission: Secondary | ICD-10-CM | POA: Diagnosis not present

## 2016-01-28 DIAGNOSIS — F331 Major depressive disorder, recurrent, moderate: Secondary | ICD-10-CM | POA: Diagnosis not present

## 2016-02-10 ENCOUNTER — Other Ambulatory Visit: Payer: Self-pay | Admitting: Women's Health

## 2016-02-10 ENCOUNTER — Other Ambulatory Visit: Payer: PPO

## 2016-02-10 DIAGNOSIS — Z1231 Encounter for screening mammogram for malignant neoplasm of breast: Secondary | ICD-10-CM

## 2016-03-09 ENCOUNTER — Other Ambulatory Visit: Payer: Self-pay | Admitting: Internal Medicine

## 2016-03-11 DIAGNOSIS — N3281 Overactive bladder: Secondary | ICD-10-CM | POA: Diagnosis not present

## 2016-03-11 DIAGNOSIS — R351 Nocturia: Secondary | ICD-10-CM | POA: Diagnosis not present

## 2016-03-18 ENCOUNTER — Ambulatory Visit: Payer: PPO

## 2016-03-18 ENCOUNTER — Other Ambulatory Visit: Payer: PPO

## 2016-04-12 ENCOUNTER — Ambulatory Visit
Admission: RE | Admit: 2016-04-12 | Discharge: 2016-04-12 | Disposition: A | Discharge status: Home / Self Care | Payer: PPO | Source: Ambulatory Visit | Attending: Internal Medicine | Admitting: Internal Medicine

## 2016-04-12 ENCOUNTER — Ambulatory Visit
Admission: RE | Admit: 2016-04-12 | Discharge: 2016-04-12 | Disposition: A | Discharge status: Home / Self Care | Payer: PPO | Source: Ambulatory Visit | Attending: Women's Health | Admitting: Women's Health

## 2016-04-12 DIAGNOSIS — S22089G Unspecified fracture of T11-T12 vertebra, subsequent encounter for fracture with delayed healing: Secondary | ICD-10-CM

## 2016-04-12 DIAGNOSIS — M8580 Other specified disorders of bone density and structure, unspecified site: Secondary | ICD-10-CM

## 2016-04-12 DIAGNOSIS — Z1231 Encounter for screening mammogram for malignant neoplasm of breast: Secondary | ICD-10-CM

## 2016-04-12 DIAGNOSIS — M85851 Other specified disorders of bone density and structure, right thigh: Secondary | ICD-10-CM | POA: Diagnosis not present

## 2016-04-12 DIAGNOSIS — Z780 Asymptomatic menopausal state: Secondary | ICD-10-CM | POA: Diagnosis not present

## 2016-04-13 ENCOUNTER — Ambulatory Visit (INDEPENDENT_AMBULATORY_CARE_PROVIDER_SITE_OTHER): Payer: PPO | Admitting: Internal Medicine

## 2016-04-13 ENCOUNTER — Encounter: Payer: Self-pay | Admitting: Internal Medicine

## 2016-04-13 VITALS — BP 110/70 | HR 73 | Temp 97.2°F | Wt 123.8 lb

## 2016-04-13 DIAGNOSIS — F419 Anxiety disorder, unspecified: Secondary | ICD-10-CM

## 2016-04-13 DIAGNOSIS — R413 Other amnesia: Secondary | ICD-10-CM

## 2016-04-13 DIAGNOSIS — F418 Other specified anxiety disorders: Secondary | ICD-10-CM

## 2016-04-13 DIAGNOSIS — Z87891 Personal history of nicotine dependence: Secondary | ICD-10-CM

## 2016-04-13 DIAGNOSIS — E039 Hypothyroidism, unspecified: Secondary | ICD-10-CM | POA: Diagnosis not present

## 2016-04-13 DIAGNOSIS — G250 Essential tremor: Secondary | ICD-10-CM | POA: Diagnosis not present

## 2016-04-13 DIAGNOSIS — M8580 Other specified disorders of bone density and structure, unspecified site: Secondary | ICD-10-CM

## 2016-04-13 DIAGNOSIS — F329 Major depressive disorder, single episode, unspecified: Secondary | ICD-10-CM

## 2016-04-13 DIAGNOSIS — F32A Depression, unspecified: Secondary | ICD-10-CM

## 2016-04-13 LAB — TSH: TSH: 1.41 mIU/L

## 2016-04-13 MED ORDER — IBANDRONATE SODIUM 150 MG PO TABS: 150.0000 mg | ORAL_TABLET | ORAL | 11 refills | Status: DC

## 2016-04-13 NOTE — Progress Notes (Signed)
   Subjective:    Patient ID: Megan Williams, female    DOB: [birth year], 72 y.o.   MRN: [id number]  HPI 71 year old Female in today for six-month follow-up on hypothyroidism and memory issues. History of T12 compression fracture which has healed. She is followed by Dr. Ellene Route for these issues. Recent bone density study results reviewed with her. She has a T score in the right femoral neck of -1.7 and in the left forearm of -0.2. This is consistent with osteopenia. Results are stable from 2 years ago. The compression fracture was the result from a fall. Suggest we repeat bone density in 2 years.   Has been on Chantix to stop smoking   Has been diagnosed by a neurologist with a tremor and mild cognitive impairment. History of anxiety depression.   History of low B-12 level in the low 200 range treated with oral supplementation.    Review of Systems see above     Objective:   Physical Exam Bilateral essential tremor. No thyromegaly. Chest clear. Cardiac exam regular rate and rhythm normal S1 and S2. She does appear to be forgetful in the office today. I am concerned she is developing some early dementia   . Neurologist has her on Namenda. She has had success with Chantix and his almost stop smoking. She is on Wellbutrin and trazodone for depression. She is on levothyroxine 0.088 mg daily. She is on Xanax 1 mg 2 times daily as needed. This can cause some memory issues. She is a Wellbutrin 150 mg daily and Effexor 150 mg daily along with trazodone 100 mg daily.  B-12 level in August 2017 was 588.  TSH drawn February 13 is normal at 1.41         Assessment & Plan:  Osteopenia  Hypothyroidism-TSH is normal  Bilateral tremor thought to be essential tremor  Memory loss  B-12 deficiency-stable with oral supplementation  T12 compression fracture secondary to fall  Plan: Continue same medications and return in August for physical examination. Congratulations on quitting smoking.

## 2016-04-14 ENCOUNTER — Encounter: Payer: Self-pay | Admitting: Women's Health

## 2016-04-27 NOTE — Patient Instructions (Signed)
It was a pleasure to see you today. Continue same medications and return in 6 months. Congratulations on quitting smoking.

## 2016-04-28 ENCOUNTER — Encounter: Payer: Self-pay | Admitting: Adult Health

## 2016-04-28 ENCOUNTER — Ambulatory Visit (INDEPENDENT_AMBULATORY_CARE_PROVIDER_SITE_OTHER): Payer: PPO | Admitting: Adult Health

## 2016-04-28 VITALS — BP 138/84 | HR 78 | Ht 62.0 in | Wt 124.8 lb

## 2016-04-28 DIAGNOSIS — G250 Essential tremor: Secondary | ICD-10-CM

## 2016-04-28 DIAGNOSIS — G3184 Mild cognitive impairment, so stated: Secondary | ICD-10-CM

## 2016-04-28 NOTE — Patient Instructions (Signed)
Continue Xanax for tremor Memory score is stable continue Namenda If your symptoms worsen or you develop new symptoms please let us know.

## 2016-04-28 NOTE — Progress Notes (Signed)
PATIENT: Megan Williams DOB: 28-Jul-1945  REASON FOR VISIT: follow up HISTORY FROM: patient  HISTORY OF PRESENT ILLNESS: HISTORY INITIAL VISIT HISTORY [name]: [name] 71 years old right-handed female, seen in refer by her primary care physician Dr. Tedra Senegal for evaluation of tremor, memory loss, and gait problems   She had a history of hypertension, depression, anxiety, used to work as a Radio broadcast assistant, and church Glass blower/designer  Around 2015, she began to notice memory trouble, she needs family to remind her multiple times, tends to forget people's name, phone number, she used to be able to remember all the congregation's name in the past, her memory trouble since 4 gradually getting worse, she still driving without getting lost  She reported history of migraine since young, for a while in September to October 2015, she has migraines almost on a daily basis, which has improved after stopped taking Trileptal  She also reported mild bilateral hands tremor since 2014, most noticeable when she holding a utensil, or write with a pencil, she also noticed mild bilateral hands weakness, in Thanksgiving 2015, she has dropped her dishes to the floor, because of bilateral hands weakness,  Around 2015, she also noticed mild stiff unsteady gait, worsening urinary urgency, she denies significant neck pain, complains of moderate low back pain, she denies bilateral upper or lower extremity paresthesia  She is adopted, does not know family history, none of her children has tremor  UPDATE Jan 29 2015: She is with her husband at today's clinical visit, we have reviewed MRI brain film in November 2016, mild generalized atrophy, mild supratentorium small vessel disease, MRI of the cervical spine showed evidence of previous fusion from C4-7, mild canal stenosis at C 2-3 level, no evidence of cord signal changes She continue complains of change of her handwriting bilateral hands tremor, no gait  difficulty, she has urinary urgency, no incontinence, no gait difficulties She complains of mild neck pain, spreading forward to become headaches,  UPDATE 04/27/14: [name] is a 71 year old female with a history of essential tremor and mild cognitive impairment. At the last visit she was started on Namenda. She doesn't feel that her memory has gotten any worse. She is able to complete all ADLs independently. She operates a Teacher, music without difficulty. She feels that her tremor actually improved with Namenda. She states before she felt like she had a tremor in the whole body and initially the tremor resolved with Namenda. Now it has returned to only the hands. She states that the tremor is worse with increased anxiety. Xanax does help some. Tremor affects her handwriting and when buttoning buttons. No trouble with eating. She returns today for an evaluation.   10/27/2015:  [name] is a 71 year old female with a history of essential tremor and mild cognitive impairment. She returns today for follow-up. She remains on Namenda and is tolerating it well. She denies any changes in her memory. She is able to complete all ADLs independently. She operates a Teacher, music without difficulty. She is able to prepare her  meals and handle her finances without difficulty. She feels that her tremor has remained stable as well. She states that it does get worse with anxiety. She states that she can take Xanax and finds it beneficial. Denies any new medical issues. He returns today for an evaluation.  Today April 28 2016 [name] is a 71 year old female with a history of essential tremor and mild cognitive impairment. She returns today for  follow-up. She feels that her tremor has remained stable. She continues to use Xanax as needed. She also feels that her memory has remained stable. She continues on Namenda. Able to complete all ADLs independently. She lives at home with her husband. She handles her own  meals without difficulty. She is able to prepare her own finances without difficulty. Denies hallucinations. States that she is sleeping well when she uses trazodone.. Denies any changes in mood or behavior. Overall she feels that she is doing well. She returns today for an evaluation.  REVIEW OF SYSTEMS: Out of a complete 14 system review of symptoms, the patient complains only of the following symptoms, and all other reviewed systems are negative.  See history of present illness  ALLERGIES: Allergies  Allergen Reactions  . Doxycycline     Skin peeling   . Penicillins Hives  . Latex Rash    HOME MEDICATIONS: Outpatient Medications Prior to Visit  Medication Sig Dispense Refill  . ALPRAZolam (XANAX) 1 MG tablet Take 1 mg by mouth 2 (two) times daily as needed.     Marland Kitchen buPROPion (WELLBUTRIN XL) 300 MG 24 hr tablet Take 300 mg by mouth daily.     . CHANTIX 0.5 MG tablet TAKE 1 TABLET BY MOUTH 2 TIMES DAILY. 56 tablet 1  . Cholecalciferol (VITAMIN D PO) Take 1 tablet by mouth daily. 2000 units    . Cyanocobalamin (RA VITAMIN B-12 TR) 1000 MCG TBCR Take 1 tablet by mouth daily.    . diphenhydrAMINE (BENADRYL) 25 MG tablet Take 25 mg by mouth at bedtime as needed.    Marland Kitchen estradiol (ESTRACE) 1 MG tablet Take 1 tablet (1 mg total) by mouth daily. 90 tablet 4  . fexofenadine (ALLEGRA) 60 MG tablet Take 60 mg by mouth daily.      Marland Kitchen levothyroxine (SYNTHROID, LEVOTHROID) 88 MCG tablet TAKE 1 TABLET BY MOUTH DAILY. 90 tablet 0  . MAGNESIUM PO Take 1 tablet by mouth daily. 250mg     . memantine (NAMENDA) 10 MG tablet TAKE 1 TABLET BY MOUTH 2 TIMES DAILY. 60 tablet 11  . Multiple Vitamin (MULTIVITAMIN) tablet Take 1 tablet by mouth daily.    . naproxen sodium (ANAPROX) 220 MG tablet Take 220 mg by mouth as needed.     . pantoprazole (PROTONIX) 40 MG tablet Take 1 tablet (40 mg total) by mouth daily. (Patient taking differently: Take 40 mg by mouth as needed. ) 90 tablet 3  . traZODone (DESYREL) 100 MG  tablet Take 200 mg by mouth at bedtime.     . valACYclovir (VALTREX) 500 MG tablet Take twice daily for 3-5 days (Patient taking differently: as needed. Take twice daily for 3-5 days) 30 tablet 6  . venlafaxine XR (EFFEXOR-XR) 150 MG 24 hr capsule Take 300 mg by mouth every morning.   2  . ibandronate (BONIVA) 150 MG tablet Take 1 tablet (150 mg total) by mouth every 30 (thirty) days. Take in the morning with a full glass of water, on an empty stomach, and do not take anything else by mouth or lie down for the next 30 min. (Patient not taking: Reported on 04/28/2016) 1 tablet 11   No facility-administered medications prior to visit.     PAST MEDICAL HISTORY: Past Medical History:  Diagnosis Date  . Allergy   . Anxiety   . COPD (chronic obstructive pulmonary disease) ([place])   . Depression   . Emphysema   . Esophagitis   . Herpes simplex   .  Hypertension   . Migraines   . Osteopenia   . Vitamin D deficiency     PAST SURGICAL HISTORY: Past Surgical History:  Procedure Laterality Date  . BACK SURGERY    . BREAST EXCISIONAL BIOPSY Left    Benign   . CERVICAL FUSION    . CHOLECYSTECTOMY    . KYPHOPLASTY  09/19/2015   Dr. Ellene Route  T12  . Thumb surg    . THYROIDECTOMY    . TONSILLECTOMY    . VAGINAL HYSTERECTOMY      FAMILY HISTORY: Family History  Problem Relation Age of Onset  . Adopted: Yes  . Family history unknown: Yes    SOCIAL HISTORY: Social History   Social History  . Marital status: Married    Spouse name: N/A  . Number of children: 2  . Years of education: 14   Occupational History  . Retired    Social History Main Topics  . Smoking status: Former Smoker    Packs/day: 1.00    Years: 35.00    Types: Cigarettes  . Smokeless tobacco: Never Used     Comment: last week of chantix  04/28/2016  . Alcohol use No  . Drug use: No  . Sexual activity: No     Comment: INTERCOURSE AGE 80, SEXUAL PARTNERS LESS THAN 5   Other Topics Concern  . Not on file    Social History Narrative   Lives at home with husband.   Right-handed.   1 cup caffeine daily.      PHYSICAL EXAM  Vitals:   04/28/16 0916  BP: 138/84  Pulse: 78  Weight: 124 lb 12.8 oz (56.6 kg)  Height: 5\' 2"  (1.575 m)   Body mass index is 22.83 kg/m.  MMSE - Mini Mental State Exam 04/28/2016 10/27/2015 04/28/2015  Orientation to time 5 5 5   Orientation to Place 5 5 5   Registration 3 3 3   Attention/ Calculation 5 5 5   Recall 3 2 3   Language- name 2 objects 2 2 2   Language- repeat 1 1 1   Language- follow 3 step command 3 3 2   Language- read & follow direction 1 1 1   Write a sentence 1 1 1   Copy design 1 1 1   Total score 30 29 29      Generalized: Well developed, in no acute distress   Neurological examination  Mentation: Alert oriented to time, place, history taking. Follows all commands speech and language fluent Cranial nerve II-XII: Pupils were equal round reactive to light. Extraocular movements were full, visual field were full on confrontational test. Facial sensation and strength were normal. Uvula tongue midline. Head turning and shoulder shrug  were normal and symmetric. Motor: The motor testing reveals 5 over 5 strength of all 4 extremities. Good symmetric motor tone is noted throughout.  Sensory: Sensory testing is intact to soft touch on all 4 extremities. No evidence of extinction is noted.  Coordination: Cerebellar testing reveals good finger-nose-finger and heel-to-shin bilaterally.  Gait and station: Gait is normal. Tandem gait is slightly unstable. Romberg is negative. No drift is seen.  Reflexes: Deep tendon reflexes are symmetric and normal bilaterally.   DIAGNOSTIC DATA (LABS, IMAGING, TESTING) - I reviewed patient records, labs, notes, testing and imaging myself where available.  Lab Results  Component Value Date   WBC 6.1 10/06/2015   HGB 13.2 10/06/2015   HCT 40.3 10/06/2015   MCV 102.0 (H) 10/06/2015   PLT 213 10/06/2015       Component  Value Date/Time   NA 141 10/06/2015 1119   K 4.5 10/06/2015 1119   CL 104 10/06/2015 1119   CO2 29 10/06/2015 1119   GLUCOSE 102 (H) 10/06/2015 1119   BUN 10 10/06/2015 1119   CREATININE 0.89 10/06/2015 1119   CALCIUM 9.4 10/06/2015 1119   PROT 6.3 10/06/2015 1119   ALBUMIN 4.0 10/06/2015 1119   AST 15 10/06/2015 1119   ALT 11 10/06/2015 1119   ALKPHOS 61 10/06/2015 1119   BILITOT 0.3 10/06/2015 1119   GFRNONAA 67 10/06/2015 1119   GFRAA 77 10/06/2015 1119   Lab Results  Component Value Date   CHOL 188 10/06/2015   HDL 71 10/06/2015   LDLCALC 91 10/06/2015   TRIG 129 10/06/2015   CHOLHDL 2.6 10/06/2015   No results found for: HGBA1C Lab Results  Component Value Date   [id number] 10/06/2015   Lab Results  Component Value Date   TSH 1.41 04/13/2016      ASSESSMENT AND PLAN 71 y.o. year old female  has a past medical history of Allergy; Anxiety; COPD (chronic obstructive pulmonary disease) ([place]); Depression; Emphysema; Esophagitis; Herpes simplex; Hypertension; Migraines; Osteopenia; and Vitamin D deficiency. here with:  1. Essential tremor 2. Mild cognitive impairment  The patient will continue using Xanax as needed for tremor- this is prescribed by her psychiatrist. Advised that if her tremor worsen she should let us know. Her memory score has remained stable. She will continue on Namenda. Advised that if her symptoms worsen or she develops new symptoms she she'll let us know. Follow-up in 6 months with Dr. [name].     Ward Givens, MSN, NP-C 04/28/2016, 9:22 AM [facility] [address] [place], [place] [address] [phone]

## 2016-04-30 ENCOUNTER — Telehealth: Payer: Self-pay | Admitting: Internal Medicine

## 2016-04-30 NOTE — Telephone Encounter (Signed)
Spoke with patient and she'll consider the Prolia injection.  For now, we will refer her to Dr. Wandra Feinstein.  Left message with Tesa @ Dr. Kathrin Penner office @ [phone].  She'sl to call the patient and to call me back with the date that they will see the patient.  And, she's to call me with fax # so I can send note to Dr. Layne Benton.

## 2016-04-30 NOTE — Telephone Encounter (Signed)
Patient states she cannot take the Boniva.  She took the pill on 2/14.  She was sick to her stomach and had diarrhea x 5 days.  Then, once she got over the vomiting and diarrhea, she has been bloated and had gas on her stomach ever since.  She states that she has enough stomach issues without adding this to the list.    What other options are there for her to do?    Best number for contact:  [phone]

## 2016-04-30 NOTE — Telephone Encounter (Signed)
Prolia if she is willing to consider it. Would refer her to Dr. Layne Benton for options

## 2016-05-03 NOTE — Telephone Encounter (Signed)
Megan Williams called today and Dr. Layne Benton would like to see clinical information prior to making an appointment.  Faxed over 81 pages of clinical information to their office to consider for the [facility].  Explained that she failed on Boniva and doesn't do PO Medications well.  For this reason, we felt she would be a good candidate for their clinic.  Awaiting a call back from Dr. Kathrin Penner office with appointment information.

## 2016-05-13 DIAGNOSIS — E559 Vitamin D deficiency, unspecified: Secondary | ICD-10-CM | POA: Diagnosis not present

## 2016-05-13 DIAGNOSIS — M810 Age-related osteoporosis without current pathological fracture: Secondary | ICD-10-CM | POA: Diagnosis not present

## 2016-05-14 NOTE — Telephone Encounter (Signed)
Call made today to Tesa @ Dr. Kathrin Penner office to follow up to see if patient had appointment date yet.  Per Tesa, patient was seen yesterday, 3/15 @ 9:45 in the office.  Notes will follow once dictation has been completed.

## 2016-05-25 DIAGNOSIS — F331 Major depressive disorder, recurrent, moderate: Secondary | ICD-10-CM | POA: Diagnosis not present

## 2016-05-25 DIAGNOSIS — F411 Generalized anxiety disorder: Secondary | ICD-10-CM | POA: Diagnosis not present

## 2016-05-25 DIAGNOSIS — F429 Obsessive-compulsive disorder, unspecified: Secondary | ICD-10-CM | POA: Diagnosis not present

## 2016-06-08 ENCOUNTER — Ambulatory Visit (INDEPENDENT_AMBULATORY_CARE_PROVIDER_SITE_OTHER): Payer: PPO | Admitting: Women's Health

## 2016-06-08 ENCOUNTER — Encounter: Payer: Self-pay | Admitting: Women's Health

## 2016-06-08 VITALS — BP 110/76 | Ht 62.0 in | Wt 122.0 lb

## 2016-06-08 DIAGNOSIS — Z79890 Hormone replacement therapy (postmenopausal): Secondary | ICD-10-CM | POA: Diagnosis not present

## 2016-06-08 MED ORDER — ESTRADIOL 0.5 MG PO TABS: 0.5000 mg | ORAL_TABLET | Freq: Every day | ORAL | 4 refills | Status: DC

## 2016-06-08 NOTE — Patient Instructions (Signed)
Health Maintenance for Postmenopausal Women Menopause is a normal process in which your reproductive ability comes to an end. This process happens gradually over a span of months to years, usually between the ages of 33 and 38. Menopause is complete when you have missed 12 consecutive menstrual periods. It is important to talk with your health care provider about some of the most common conditions that affect postmenopausal women, such as heart disease, cancer, and bone loss (osteoporosis). Adopting a healthy lifestyle and getting preventive care can help to promote your health and wellness. Those actions can also lower your chances of developing some of these common conditions. What should I know about menopause? During menopause, you may experience a number of symptoms, such as:  Moderate-to-severe hot flashes.  Night sweats.  Decrease in sex drive.  Mood swings.  Headaches.  Tiredness.  Irritability.  Memory problems.  Insomnia. Choosing to treat or not to treat menopausal changes is an individual decision that you make with your health care provider. What should I know about hormone replacement therapy and supplements? Hormone therapy products are effective for treating symptoms that are associated with menopause, such as hot flashes and night sweats. Hormone replacement carries certain risks, especially as you become older. If you are thinking about using estrogen or estrogen with progestin treatments, discuss the benefits and risks with your health care provider. What should I know about heart disease and stroke? Heart disease, heart attack, and stroke become more likely as you age. This may be due, in part, to the hormonal changes that your body experiences during menopause. These can affect how your body processes dietary fats, triglycerides, and cholesterol. Heart attack and stroke are both medical emergencies. There are many things that you can do to help prevent heart disease  and stroke:  Have your blood pressure checked at least every 1-2 years. High blood pressure causes heart disease and increases the risk of stroke.  If you are 48-61 years old, ask your health care provider if you should take aspirin to prevent a heart attack or a stroke.  Do not use any tobacco products, including cigarettes, chewing tobacco, or electronic cigarettes. If you need help quitting, ask your health care provider.  It is important to eat a healthy diet and maintain a healthy weight.  Be sure to include plenty of vegetables, fruits, low-fat dairy products, and lean protein.  Avoid eating foods that are high in solid fats, added sugars, or salt (sodium).  Get regular exercise. This is one of the most important things that you can do for your health.  Try to exercise for at least 150 minutes each week. The type of exercise that you do should increase your heart rate and make you sweat. This is known as moderate-intensity exercise.  Try to do strengthening exercises at least twice each week. Do these in addition to the moderate-intensity exercise.  Know your numbers.Ask your health care provider to check your cholesterol and your blood glucose. Continue to have your blood tested as directed by your health care provider. What should I know about cancer screening? There are several types of cancer. Take the following steps to reduce your risk and to catch any cancer development as early as possible. Breast Cancer  Practice breast self-awareness.  This means understanding how your breasts normally appear and feel.  It also means doing regular breast self-exams. Let your health care provider know about any changes, no matter how small.  If you are 40 or older,  have a clinician do a breast exam (clinical breast exam or CBE) every year. Depending on your age, family history, and medical history, it may be recommended that you also have a yearly breast X-ray (mammogram).  If you  have a family history of breast cancer, talk with your health care provider about genetic screening.  If you are at high risk for breast cancer, talk with your health care provider about having an MRI and a mammogram every year.  Breast cancer (BRCA) gene test is recommended for women who have family members with BRCA-related cancers. Results of the assessment will determine the need for genetic counseling and BRCA1 and for BRCA2 testing. BRCA-related cancers include these types:  Breast. This occurs in males or females.  Ovarian.  Tubal. This may also be called fallopian tube cancer.  Cancer of the abdominal or pelvic lining (peritoneal cancer).  Prostate.  Pancreatic. Cervical, Uterine, and Ovarian Cancer  Your health care provider may recommend that you be screened regularly for cancer of the pelvic organs. These include your ovaries, uterus, and vagina. This screening involves a pelvic exam, which includes checking for microscopic changes to the surface of your cervix (Pap test).  For women ages 21-65, health care providers may recommend a pelvic exam and a Pap test every three years. For women ages 23-65, they may recommend the Pap test and pelvic exam, combined with testing for human papilloma virus (HPV), every five years. Some types of HPV increase your risk of cervical cancer. Testing for HPV may also be done on women of any age who have unclear Pap test results.  Other health care providers may not recommend any screening for nonpregnant women who are considered low risk for pelvic cancer and have no symptoms. Ask your health care provider if a screening pelvic exam is right for you.  If you have had past treatment for cervical cancer or a condition that could lead to cancer, you need Pap tests and screening for cancer for at least 20 years after your treatment. If Pap tests have been discontinued for you, your risk factors (such as having a new sexual partner) need to be reassessed  to determine if you should start having screenings again. Some women have medical problems that increase the chance of getting cervical cancer. In these cases, your health care provider may recommend that you have screening and Pap tests more often.  If you have a family history of uterine cancer or ovarian cancer, talk with your health care provider about genetic screening.  If you have vaginal bleeding after reaching menopause, tell your health care provider.  There are currently no reliable tests available to screen for ovarian cancer. Lung Cancer  Lung cancer screening is recommended for adults 99-83 years old who are at high risk for lung cancer because of a history of smoking. A yearly low-dose CT scan of the lungs is recommended if you:  Currently smoke.  Have a history of at least 30 pack-years of smoking and you currently smoke or have quit within the past 15 years. A pack-year is smoking an average of one pack of cigarettes per day for one year. Yearly screening should:  Continue until it has been 15 years since you quit.  Stop if you develop a health problem that would prevent you from having lung cancer treatment. Colorectal Cancer  This type of cancer can be detected and can often be prevented.  Routine colorectal cancer screening usually begins at age 72 and continues  through age 75.  If you have risk factors for colon cancer, your health care provider may recommend that you be screened at an earlier age.  If you have a family history of colorectal cancer, talk with your health care provider about genetic screening.  Your health care provider may also recommend using home test kits to check for hidden blood in your stool.  A small camera at the end of a tube can be used to examine your colon directly (sigmoidoscopy or colonoscopy). This is done to check for the earliest forms of colorectal cancer.  Direct examination of the colon should be repeated every 5-10 years until  age 75. However, if early forms of precancerous polyps or small growths are found or if you have a family history or genetic risk for colorectal cancer, you may need to be screened more often. Skin Cancer  Check your skin from head to toe regularly.  Monitor any moles. Be sure to tell your health care provider:  About any new moles or changes in moles, especially if there is a change in a mole's shape or color.  If you have a mole that is larger than the size of a pencil eraser.  If any of your family members has a history of skin cancer, especially at a young age, talk with your health care provider about genetic screening.  Always use sunscreen. Apply sunscreen liberally and repeatedly throughout the day.  Whenever you are outside, protect yourself by wearing long sleeves, pants, a wide-brimmed hat, and sunglasses. What should I know about osteoporosis? Osteoporosis is a condition in which bone destruction happens more quickly than new bone creation. After menopause, you may be at an increased risk for osteoporosis. To help prevent osteoporosis or the bone fractures that can happen because of osteoporosis, the following is recommended:  If you are 19-50 years old, get at least 1,000 mg of calcium and at least 600 mg of vitamin D per day.  If you are older than age 50 but younger than age 70, get at least 1,200 mg of calcium and at least 600 mg of vitamin D per day.  If you are older than age 70, get at least 1,200 mg of calcium and at least 800 mg of vitamin D per day. Smoking and excessive alcohol intake increase the risk of osteoporosis. Eat foods that are rich in calcium and vitamin D, and do weight-bearing exercises several times each week as directed by your health care provider. What should I know about how menopause affects my mental health? Depression may occur at any age, but it is more common as you become older. Common symptoms of depression include:  Low or sad  mood.  Changes in sleep patterns.  Changes in appetite or eating patterns.  Feeling an overall lack of motivation or enjoyment of activities that you previously enjoyed.  Frequent crying spells. Talk with your health care provider if you think that you are experiencing depression. What should I know about immunizations? It is important that you get and maintain your immunizations. These include:  Tetanus, diphtheria, and pertussis (Tdap) booster vaccine.  Influenza every year before the flu season begins.  Pneumonia vaccine.  Shingles vaccine. Your health care provider may also recommend other immunizations. This information is not intended to replace advice given to you by your health care provider. Make sure you discuss any questions you have with your health care provider. Document Released: 04/09/2005 Document Revised: 09/05/2015 Document Reviewed: 11/19/2014 [employer]vier Interactive Patient   Education  2017 [employer]  

## 2016-06-08 NOTE — Progress Notes (Signed)
Megan Williams November 24, 1945 [id number]    History:    Presents for breast and pelvic exam. 1979 TVH for abn pap, normal paps after, on estradiol.  Normal mammogram history.  2018 Tscore -1.2, history of compression back fx, had back surgery last year. HSV rare outbreaks. Hypertension, COPD, hypothyroidism managed by primary care. 2013 negative colonoscopy. Current on vaccines. Not sexually active/husbands health.  Past medical history, past surgical history, family history and social history were all reviewed and documented in the EPIC chart. History of [place] terrier's and also fosters 2 others.  ROS:  A ROS was performed and pertinent positives and negatives are included.  Exam:  Vitals:   06/08/16 1020  BP: 110/76  Weight: 122 lb (55.3 kg)  Height: 5\' 2"  (1.575 m)   Body mass index is 22.31 kg/m.   General appearance:  Normal Thyroid:  Symmetrical, normal in size, without palpable masses or nodularity. Respiratory  Auscultation:  Clear without wheezing or rhonchi Cardiovascular  Auscultation:  Regular rate, without rubs, murmurs or gallops  Edema/varicosities:  Not grossly evident Abdominal  Soft,nontender, without masses, guarding or rebound.  Liver/spleen:  No organomegaly noted  Hernia:  None appreciated  Skin  Inspection:  Grossly normal   Breasts: Examined lying and sitting.     Right: Without masses, retractions, discharge or axillary adenopathy.     Left: Without masses, retractions, discharge or axillary adenopathy. Gentitourinary   Inguinal/mons:  Normal without inguinal adenopathy  External genitalia:  Normal  BUS/Urethra/Skene's glands:  Normal  Vagina:  Normal  Cervix: And uterus absent  Adnexa/parametria:     Rt: Without masses or tenderness.   Lt: Without masses or tenderness.  Anus and perineum: Normal  Digital rectal exam: Normal sphincter tone without palpated masses or tenderness  Assessment/Plan:  71 y.o. [name] G2P2 for breast and pelvic exam with no  gyn complaints.  30 TVH on estradiol HSV rare outbreaks Osteopenia with history of compression fracture Hypertension/COPD/hypothyroidism managed by primary care absent meds  Plan: SBE's, continue annual 3-D screening mammogram. Reviewed importance of increasing regular weightbearing exercise, yoga encourage, home safety, fall prevention reviewed. Has follow-up scheduled with primary care for probable Prolia treatment.  HRT reviewed reviewed importance of decreasing/stopping. States has numerous hot flashes causing poor sleep, currently on estradiol 1 mg will try the 0.5 mg did review decreasing to a half tablet of the 0.5 in the future. Prescription given risks of blood clots, strokes and breast cancer reviewed.    [place], 11:19 AM 06/08/2016

## 2016-06-15 DIAGNOSIS — H16223 Keratoconjunctivitis sicca, not specified as Sjogren's, bilateral: Secondary | ICD-10-CM | POA: Diagnosis not present

## 2016-06-15 DIAGNOSIS — H5203 Hypermetropia, bilateral: Secondary | ICD-10-CM | POA: Diagnosis not present

## 2016-06-15 DIAGNOSIS — Z961 Presence of intraocular lens: Secondary | ICD-10-CM | POA: Diagnosis not present

## 2016-06-15 DIAGNOSIS — H43813 Vitreous degeneration, bilateral: Secondary | ICD-10-CM | POA: Diagnosis not present

## 2016-06-15 DIAGNOSIS — H02834 Dermatochalasis of left upper eyelid: Secondary | ICD-10-CM | POA: Diagnosis not present

## 2016-06-15 DIAGNOSIS — H02831 Dermatochalasis of right upper eyelid: Secondary | ICD-10-CM | POA: Diagnosis not present

## 2016-06-18 DIAGNOSIS — E559 Vitamin D deficiency, unspecified: Secondary | ICD-10-CM | POA: Diagnosis not present

## 2016-06-18 DIAGNOSIS — M810 Age-related osteoporosis without current pathological fracture: Secondary | ICD-10-CM | POA: Diagnosis not present

## 2016-07-27 ENCOUNTER — Other Ambulatory Visit: Payer: Self-pay | Admitting: Internal Medicine

## 2016-07-27 DIAGNOSIS — F330 Major depressive disorder, recurrent, mild: Secondary | ICD-10-CM | POA: Diagnosis not present

## 2016-07-27 DIAGNOSIS — F411 Generalized anxiety disorder: Secondary | ICD-10-CM | POA: Diagnosis not present

## 2016-08-24 DIAGNOSIS — L821 Other seborrheic keratosis: Secondary | ICD-10-CM | POA: Diagnosis not present

## 2016-09-06 ENCOUNTER — Other Ambulatory Visit: Payer: Self-pay | Admitting: Women's Health

## 2016-09-06 DIAGNOSIS — B009 Herpesviral infection, unspecified: Secondary | ICD-10-CM

## 2016-09-08 DIAGNOSIS — N3281 Overactive bladder: Secondary | ICD-10-CM | POA: Diagnosis not present

## 2016-09-08 DIAGNOSIS — R32 Unspecified urinary incontinence: Secondary | ICD-10-CM | POA: Diagnosis not present

## 2016-09-20 ENCOUNTER — Encounter (HOSPITAL_COMMUNITY): Payer: Self-pay | Admitting: Emergency Medicine

## 2016-09-20 ENCOUNTER — Emergency Department (HOSPITAL_COMMUNITY)
Admission: EM | Admit: 2016-09-20 | Discharge: 2016-09-20 | Disposition: A | Discharge status: Home / Self Care | Payer: PPO | Attending: Emergency Medicine | Admitting: Emergency Medicine

## 2016-09-20 ENCOUNTER — Other Ambulatory Visit: Payer: Self-pay

## 2016-09-20 ENCOUNTER — Telehealth: Payer: Self-pay | Admitting: Internal Medicine

## 2016-09-20 ENCOUNTER — Emergency Department (HOSPITAL_COMMUNITY): Payer: PPO

## 2016-09-20 DIAGNOSIS — I10 Essential (primary) hypertension: Secondary | ICD-10-CM | POA: Insufficient documentation

## 2016-09-20 DIAGNOSIS — Z79899 Other long term (current) drug therapy: Secondary | ICD-10-CM | POA: Diagnosis not present

## 2016-09-20 DIAGNOSIS — Z9049 Acquired absence of other specified parts of digestive tract: Secondary | ICD-10-CM | POA: Diagnosis not present

## 2016-09-20 DIAGNOSIS — F419 Anxiety disorder, unspecified: Secondary | ICD-10-CM | POA: Insufficient documentation

## 2016-09-20 DIAGNOSIS — F329 Major depressive disorder, single episode, unspecified: Secondary | ICD-10-CM | POA: Insufficient documentation

## 2016-09-20 DIAGNOSIS — Z87891 Personal history of nicotine dependence: Secondary | ICD-10-CM | POA: Diagnosis not present

## 2016-09-20 DIAGNOSIS — J449 Chronic obstructive pulmonary disease, unspecified: Secondary | ICD-10-CM | POA: Diagnosis not present

## 2016-09-20 DIAGNOSIS — E039 Hypothyroidism, unspecified: Secondary | ICD-10-CM | POA: Insufficient documentation

## 2016-09-20 DIAGNOSIS — R079 Chest pain, unspecified: Secondary | ICD-10-CM | POA: Insufficient documentation

## 2016-09-20 DIAGNOSIS — R0602 Shortness of breath: Secondary | ICD-10-CM | POA: Insufficient documentation

## 2016-09-20 DIAGNOSIS — Z91040 Latex allergy status: Secondary | ICD-10-CM | POA: Insufficient documentation

## 2016-09-20 DIAGNOSIS — R1013 Epigastric pain: Secondary | ICD-10-CM | POA: Diagnosis not present

## 2016-09-20 DIAGNOSIS — R0789 Other chest pain: Secondary | ICD-10-CM | POA: Diagnosis not present

## 2016-09-20 LAB — HEPATIC FUNCTION PANEL
ALT: 12 U/L — ABNORMAL LOW (ref 14–54)
AST: 18 U/L (ref 15–41)
Albumin: 3.6 g/dL (ref 3.5–5.0)
Alkaline Phosphatase: 45 U/L (ref 38–126)
Bilirubin, Direct: 0.2 mg/dL (ref 0.1–0.5)
Indirect Bilirubin: 0.3 mg/dL (ref 0.3–0.9)
Total Bilirubin: 0.5 mg/dL (ref 0.3–1.2)
Total Protein: 6.3 g/dL — ABNORMAL LOW (ref 6.5–8.1)

## 2016-09-20 LAB — I-STAT TROPONIN, ED
Troponin i, poc: 0 ng/mL (ref 0.00–0.08)
Troponin i, poc: 0 ng/mL (ref 0.00–0.08)

## 2016-09-20 LAB — CBC
HCT: 37.2 % (ref 36.0–46.0)
Hemoglobin: 12.7 g/dL (ref 12.0–15.0)
MCH: 33.9 pg (ref 26.0–34.0)
MCHC: 34.1 g/dL (ref 30.0–36.0)
MCV: 99.2 fL (ref 78.0–100.0)
Platelets: 202 10*3/uL (ref 150–400)
RBC: 3.75 MIL/uL — ABNORMAL LOW (ref 3.87–5.11)
RDW: 13 % (ref 11.5–15.5)
WBC: 7.9 10*3/uL (ref 4.0–10.5)

## 2016-09-20 LAB — BASIC METABOLIC PANEL
Anion gap: 8 (ref 5–15)
BUN: 6 mg/dL (ref 6–20)
CO2: 26 mmol/L (ref 22–32)
Calcium: 9 mg/dL (ref 8.9–10.3)
Chloride: 102 mmol/L (ref 101–111)
Creatinine, Ser: 0.78 mg/dL (ref 0.44–1.00)
GFR calc Af Amer: >60 mL/min (ref >60)
GFR calc non Af Amer: >60 mL/min (ref >60)
Glucose, Bld: 103 mg/dL — ABNORMAL HIGH (ref 65–99)
Potassium: 4.1 mmol/L (ref 3.5–5.1)
Sodium: 136 mmol/L (ref 135–145)

## 2016-09-20 LAB — D-DIMER, QUANTITATIVE (NOT AT ARMC): D-Dimer, Quant: 0.38 ug/mL-FEU (ref 0.00–0.50)

## 2016-09-20 LAB — LIPASE, BLOOD: Lipase: 22 U/L (ref 11–51)

## 2016-09-20 LAB — BRAIN NATRIURETIC PEPTIDE: B Natriuretic Peptide: 108.4 pg/mL — ABNORMAL HIGH (ref 0.0–100.0)

## 2016-09-20 MED ORDER — GI COCKTAIL ~~LOC~~
30.0000 mL | Freq: Once | ORAL | Status: AC
  Administered 2016-09-20: 30 mL via ORAL
  Filled 2016-09-20: qty 30

## 2016-09-20 NOTE — ED Triage Notes (Signed)
Pt. Stated, for several weeks Ive had epigastric pain, both shoulders in pain , pain going down both arms to my elbows , and a headache .  Last night It was the worse.

## 2016-09-20 NOTE — ED Provider Notes (Signed)
[name] DEPT Provider Note   CSN: [id number] Arrival date & time: 09/20/16  1143     History   Chief Complaint Chief Complaint  Patient presents with  . Abdominal Pain  . Chest Pain  . Shoulder Pain    HPI Megan Williams is a 71 y.o. female.  HPI Patient presents with pain that went from her mid epigastric area up to her chest and shoulders. States it began last night. His been somewhat severe. States she was not able to sleep much because of it. Has had it on and off for the last few weeks but worse last night. States it hurts to breathe with it. No fevers. No cough. No swelling or legs. Previously was on Protonix but had been off it recently because she was no longer having pain. No nausea vomiting. States she's had decreased appetite today and has not eaten much. Past Medical History:  Diagnosis Date  . Allergy   . Anxiety   . COPD (chronic obstructive pulmonary disease) ([place])   . Depression   . Emphysema   . Esophagitis   . Herpes simplex   . Hypertension   . Migraines   . Osteopenia   . Vitamin D deficiency     Patient Active Problem List   Diagnosis Date Noted  . Mild cognitive impairment 01/29/2015  . Degenerative joint disease of cervical spine 01/29/2015  . Gait difficulty 01/06/2015  . Urinary urgency 01/06/2015  . Tremor 01/06/2015  . Esophagitis   . Emphysema   . COPD (chronic obstructive pulmonary disease) ([place]) 05/03/2011  . Osteopenia   . Migraines   . Hypertension 01/15/2011  . Insomnia 01/15/2011  . Hypothyroidism 09/17/2010  . Anxiety 09/17/2010  . Hyperlipidemia 09/17/2010  . History of smoking 09/17/2010    Past Surgical History:  Procedure Laterality Date  . BACK SURGERY    . BREAST EXCISIONAL BIOPSY Left    Benign   . CERVICAL FUSION    . CHOLECYSTECTOMY    . KYPHOPLASTY  09/19/2015   Dr. Ellene Route  T12  . Thumb surg    . THYROIDECTOMY    . TONSILLECTOMY    . VAGINAL HYSTERECTOMY      OB History    Gravida Para Term  Preterm AB Living   2 2 2     2    SAB TAB Ectopic Multiple Live Births                   Home Medications    Prior to Admission medications   Medication Sig Start Date End Date Taking? Authorizing Provider  ALPRAZolam Duanne Moron) 1 MG tablet Take 1 mg by mouth 2 (two) times daily as needed.    Yes [provider]  Artificial Tear Ointment (REFRESH LACRI-LUBE) OINT Place 1 drop into both eyes at bedtime.   Yes [provider]  buPROPion (WELLBUTRIN XL) 300 MG 24 hr tablet Take 300 mg by mouth daily.  08/25/15  Yes [provider]  Calcium-Vitamin D-Vitamin K (VIACTIV) [id number] MG-UNT-MCG CHEW Chew 2 tablets by mouth daily.   Yes [provider]  carboxymethylcellulose (REFRESH) 1 % ophthalmic solution Place 1 drop into both eyes 4 (four) times daily.   Yes [provider]  Cholecalciferol (VITAMIN D3) 5000 units TABS Take 5,000 Units by mouth daily.   Yes [provider]  Cyanocobalamin (RA VITAMIN B-12 TR) 1000 MCG TBCR Take 1 tablet by mouth daily.   Yes [provider]  denosumab (PROLIA) 60  MG/ML SOLN injection Inject 60 mg into the skin every 6 (six) months. Administer in upper arm, thigh, or abdomen   Yes [provider]  estradiol (ESTRACE) 0.5 MG tablet Take 1 tablet (0.5 mg total) by mouth daily. 06/08/16  Yes Huel Cote, NP  fexofenadine (ALLEGRA) 60 MG tablet Take 60 mg by mouth daily.     Yes [provider]  levothyroxine (SYNTHROID, LEVOTHROID) 88 MCG tablet TAKE 1 TABLET BY MOUTH DAILY. Patient taking differently: TAKE 88 mcg TABLET BY MOUTH DAILY. 07/27/16  Yes Baxley, Cresenciano Lick, MD  Magnesium Oxide (HM MAGNESIUM) 250 MG TABS Take 1 mg by mouth daily.   Yes [provider]  memantine (NAMENDA) 10 MG tablet TAKE 1 TABLET BY MOUTH 2 TIMES DAILY. Patient taking differently: TAKE 10 mg TABLET BY MOUTH 2 TIMES DAILY. 01/26/16  Yes Marcial Pacas, MD  Multiple Vitamin (MULTIVITAMIN) tablet Take 1  tablet by mouth daily. Chewable centrum   Yes [provider]  pantoprazole (PROTONIX) 40 MG tablet Take 1 tablet (40 mg total) by mouth daily. Patient taking differently: Take 20 mg by mouth daily.  03/28/14  Yes BaxleyCresenciano Lick, MD  traZODone (DESYREL) 100 MG tablet Take 100-150 mg by mouth See admin instructions. Take 100 mg to 150 mg when needed   Yes [provider]  Venlafaxine HCl 75 MG TB24 Take 75 mg by mouth 3 (three) times daily.  01/20/15  Yes [provider]  CHANTIX 0.5 MG tablet TAKE 1 TABLET BY MOUTH 2 TIMES DAILY. Patient not taking: Reported on 09/20/2016 03/09/16   Elby Showers, MD  diphenhydrAMINE (BENADRYL) 25 MG tablet Take 25 mg by mouth at bedtime as needed.    [provider]  ibandronate (BONIVA) 150 MG tablet Take 1 tablet (150 mg total) by mouth every 30 (thirty) days. Take in the morning with a full glass of water, on an empty stomach, and do not take anything else by mouth or lie down for the next 30 min. Patient not taking: Reported on 04/28/2016 04/13/16   Elby Showers, MD  naproxen sodium (ANAPROX) 220 MG tablet Take 220 mg by mouth as needed.     [provider]  valACYclovir (VALTREX) 500 MG tablet TAKE 1 TABLET BY MOUTH TWICE DAILY FOR 3 TO 5 DAYS. 09/06/16   Fontaine, Belinda Block, MD    Family History Family History  Problem Relation Age of Onset  . Adopted: Yes  . Family history unknown: Yes    Social History Social History  Substance Use Topics  . Smoking status: Former Smoker    Packs/day: 1.00    Years: 35.00    Types: Cigarettes  . Smokeless tobacco: Never Used     Comment: last week of chantix  04/28/2016  . Alcohol use No     Allergies   Doxycycline; Penicillins; and Latex   Review of Systems Review of Systems  Constitutional: Positive for appetite change. Negative for fever.  HENT: Negative for congestion.   Eyes: Negative for photophobia.  Respiratory: Positive for shortness of breath.     Cardiovascular: Positive for chest pain. Negative for leg swelling.  Gastrointestinal: Positive for abdominal pain. Negative for nausea.  Genitourinary: Negative for dysuria and enuresis.  Musculoskeletal: Positive for back pain.  Skin: Negative for rash.  Neurological: Negative for seizures.  Psychiatric/Behavioral: Negative for confusion.     Physical Exam Updated Vital Signs BP (!) 152/84   Pulse 71   Temp 98.4 F (  36.9 C) (Oral)   Resp 18   Ht 5\' 3"  (1.6 m)   Wt 54 kg (119 lb)   SpO2 97%   BMI 21.08 kg/m   Physical Exam  Constitutional: She appears well-developed.  HENT:  Head: Atraumatic.  Eyes: Pupils are equal, round, and reactive to light.  Neck: Neck supple.  Cardiovascular: Normal rate.   Pulmonary/Chest: Effort normal. She exhibits no tenderness.  Abdominal: There is no tenderness.  Musculoskeletal: She exhibits no edema.  Neurological: She is alert.  Skin: Skin is warm. Capillary refill takes less than 2 seconds.  Psychiatric: She has a normal mood and affect.     ED Treatments / Results  Labs (all labs ordered are listed, but only abnormal results are displayed) Labs Reviewed  BASIC METABOLIC PANEL - Abnormal; Notable for the following:       Result Value   Glucose, Bld 103 (*)    All other components within normal limits  CBC - Abnormal; Notable for the following:    RBC 3.75 (*)    All other components within normal limits  HEPATIC FUNCTION PANEL - Abnormal; Notable for the following:    Total Protein 6.3 (*)    ALT 12 (*)    All other components within normal limits  BRAIN NATRIURETIC PEPTIDE - Abnormal; Notable for the following:    B Natriuretic Peptide 108.4 (*)    All other components within normal limits  LIPASE, BLOOD  D-DIMER, QUANTITATIVE (NOT AT [facility])  I-STAT TROPONIN, ED  I-STAT TROPONIN, ED    EKG  EKG Interpretation  Date/Time:  Monday September 20 2016 17:46:17 EDT Ventricular Rate:  66 PR Interval:    QRS  Duration: 100 QT Interval:  438 QTC Calculation: 459 R Axis:   57 Text Interpretation:  Sinus rhythm Low voltage, precordial leads Confirmed by Davonna Belling [phone]) on 09/20/2016 8:24:08 PM       Radiology Dg Chest 2 View  Result Date: 09/20/2016 CLINICAL DATA:  Epigastric pain.  Upper extremity pain . EXAM: CHEST  2 VIEW COMPARISON:  04/23/2008. FINDINGS: Mediastinum hilar structures normal. Heart size normal. Mild increase in interstitial markings are noted particularly in the lung bases. Although these changes may be chronic an active interstitial process including pneumonitis and/or mild interstitial edema cannot be excluded. No pleural effusion or pneumothorax. Prior cervical spine fusion. Prior lower thoracic vertebroplasty. Surgical clips upper abdomen. IMPRESSION: Mild increase in interstitial markings in both lungs, particularly the lung bases. Although these changes may be chronic and active interstitial process including pneumonitis and/or mild interstitial edema cannot be excluded. Electronically Signed   By: Marcello Moores  Register   On: 09/20/2016 13:12    Procedures Procedures (including critical care time)  Medications Ordered in ED Medications  gi cocktail (Maalox,Lidocaine,Donnatal) (30 mLs Oral Given 09/20/16 1809)     Initial Impression / Assessment and Plan / ED Course  I have reviewed the triage vital signs and the nursing notes.  Pertinent labs & imaging results that were available during my care of the patient were reviewed by me and considered in my medical decision making (see chart for details).     Patient with chest pain. EKG reassuring. Enzymes negative 2. Negative d-dimer. Chest x-ray nonspecific. Feels better after GI cocktail. Will discharge home. States she also talked her gastroenterologist about starting back her medications.  Final Clinical Impressions(s) / ED Diagnoses   Final diagnoses:  Chest pain, unspecified type    New Prescriptions New  Prescriptions  No medications on file     Davonna Belling, MD 09/20/16 2025

## 2016-09-20 NOTE — Telephone Encounter (Signed)
Spoke with patient and advised that she needs to go to ED.  Patient verbalized understanding.  Wanted to know which ED.  Advised to go to [facility] ED.

## 2016-09-20 NOTE — Telephone Encounter (Signed)
Patients husband, Gwyndolyn Saxon called and states that he needs to get the patient in to see the doctor; she's having all kinds of chest pain.  States that this first started on Saturday night.  After asking questions, he put the patient on the phones.    Patient states that when she lays down she starts hurting under her breasts and the pain radiates all the way up through her chest into her jaw and down both arms.  States that she cannot get comfortable to rest.  She curled up in the bed and tried to get comfortable, even laid in the floor trying to get comfortable.  Tried taking Tums, Rolaids, Pantoprazole, nothing seemed to relieve it in the way of indigestion.  On Saturday evening, she finally took Xanax, Flexeril and Trazadone and was able to get a little relief.    Pain continued and she just didn't want to go to the hospital because her insurance doesn't pay there.  She wanted to wait until she could come to the doctor's office.  The pain is still there this morning.  When she got up this morning, it was about a 10+ on a pain scale of 1-10.  She took an Ecotrin and a Xanax about 7:00 a.m. And it has calmed down to about a 5 now.  She continues to have the same symptoms.  I advised her that we are not equipped to handle this type of chest pain here in the office and I was sure you are going to tell her to go to ED or an Urgent Care and she still wanted me to talk to you.    Best contact # for patient:  [phone]

## 2016-09-20 NOTE — Telephone Encounter (Signed)
Needs to go to ED

## 2016-09-20 NOTE — ED Notes (Signed)
Pt verbalized understanding of d/c instructions and has no further questions. Pt is stable, A&Ox4, VSS.  

## 2016-09-23 ENCOUNTER — Telehealth: Payer: Self-pay

## 2016-09-23 NOTE — Telephone Encounter (Signed)
Pt states she is doing better after the GI "cocktail" which she says helped her a lot. Also was mentioning the panic attacks it made the spasms worse. She said that she feels it is all under control now and will be here for her CPE in August unless Dr. Renold Genta feels she should come sooner.

## 2016-09-23 NOTE — Telephone Encounter (Signed)
Pt aware.

## 2016-09-23 NOTE — Telephone Encounter (Signed)
OK- will see how she does

## 2016-10-07 ENCOUNTER — Other Ambulatory Visit: Payer: Self-pay | Admitting: Internal Medicine

## 2016-10-07 ENCOUNTER — Other Ambulatory Visit: Payer: PPO | Admitting: Internal Medicine

## 2016-10-07 DIAGNOSIS — E785 Hyperlipidemia, unspecified: Secondary | ICD-10-CM | POA: Diagnosis not present

## 2016-10-07 DIAGNOSIS — E039 Hypothyroidism, unspecified: Secondary | ICD-10-CM | POA: Diagnosis not present

## 2016-10-07 DIAGNOSIS — I10 Essential (primary) hypertension: Secondary | ICD-10-CM

## 2016-10-07 DIAGNOSIS — M8580 Other specified disorders of bone density and structure, unspecified site: Secondary | ICD-10-CM

## 2016-10-07 DIAGNOSIS — Z0000 Encounter for general adult medical examination without abnormal findings: Secondary | ICD-10-CM

## 2016-10-07 LAB — CBC WITH DIFFERENTIAL/PLATELET
Basophils Absolute: 0 cells/uL (ref 0–200)
Basophils Relative: 0 %
Eosinophils Absolute: 73 cells/uL (ref 15–500)
Eosinophils Relative: 1 %
HCT: 41.5 % (ref 35.0–45.0)
Hemoglobin: 13.7 g/dL (ref 11.7–15.5)
Lymphocytes Relative: 31 %
Lymphs Abs: 2263 cells/uL (ref 850–3900)
MCH: 34.5 pg — ABNORMAL HIGH (ref 27.0–33.0)
MCHC: 33 g/dL (ref 32.0–36.0)
MCV: 104.5 fL — ABNORMAL HIGH (ref 80.0–100.0)
MPV: 9 fL (ref 7.5–12.5)
Monocytes Absolute: 438 cells/uL (ref 200–950)
Monocytes Relative: 6 %
Neutro Abs: 4526 cells/uL (ref 1500–7800)
Neutrophils Relative %: 62 %
Platelets: 240 10*3/uL (ref 140–400)
RBC: 3.97 MIL/uL (ref 3.80–5.10)
RDW: 13.5 % (ref 11.0–15.0)
WBC: 7.3 10*3/uL (ref 3.8–10.8)

## 2016-10-07 LAB — COMPLETE METABOLIC PANEL WITH GFR
ALT: 11 U/L (ref 6–29)
AST: 17 U/L (ref 10–35)
Albumin: 4 g/dL (ref 3.6–5.1)
Alkaline Phosphatase: 49 U/L (ref 33–130)
BUN: 12 mg/dL (ref 7–25)
CO2: 28 mmol/L (ref 20–32)
Calcium: 9.6 mg/dL (ref 8.6–10.4)
Chloride: 101 mmol/L (ref 98–110)
Creat: 1.01 mg/dL — ABNORMAL HIGH (ref 0.50–0.99)
GFR, Est African American: 66 mL/min (ref >=60)
GFR, Est Non African American: 57 mL/min — ABNORMAL LOW (ref >=60)
Glucose, Bld: 100 mg/dL — ABNORMAL HIGH (ref 65–99)
Potassium: 4.2 mmol/L (ref 3.5–5.3)
Sodium: 138 mmol/L (ref 135–146)
Total Bilirubin: 0.5 mg/dL (ref 0.2–1.2)
Total Protein: 6.6 g/dL (ref 6.1–8.1)

## 2016-10-07 LAB — LIPID PANEL
Cholesterol: 198 mg/dL (ref <200)
HDL: 73 mg/dL (ref >50)
LDL Cholesterol: 85 mg/dL (ref <100)
Total CHOL/HDL Ratio: 2.7 Ratio (ref <5.0)
Triglycerides: 198 mg/dL — ABNORMAL HIGH (ref <150)
VLDL: 40 mg/dL — ABNORMAL HIGH (ref <30)

## 2016-10-07 LAB — TSH: TSH: 2.4 mIU/L

## 2016-10-12 ENCOUNTER — Ambulatory Visit (INDEPENDENT_AMBULATORY_CARE_PROVIDER_SITE_OTHER): Payer: PPO | Admitting: Internal Medicine

## 2016-10-12 ENCOUNTER — Ambulatory Visit
Admission: RE | Admit: 2016-10-12 | Discharge: 2016-10-12 | Disposition: A | Discharge status: Home / Self Care | Payer: PPO | Source: Ambulatory Visit | Attending: Internal Medicine | Admitting: Internal Medicine

## 2016-10-12 ENCOUNTER — Encounter: Payer: Self-pay | Admitting: Internal Medicine

## 2016-10-12 VITALS — BP 108/74 | HR 80 | Temp 97.5°F | Resp 18 | Ht 62.0 in | Wt 121.0 lb

## 2016-10-12 DIAGNOSIS — E7849 Other hyperlipidemia: Secondary | ICD-10-CM

## 2016-10-12 DIAGNOSIS — R159 Full incontinence of feces: Secondary | ICD-10-CM

## 2016-10-12 DIAGNOSIS — E039 Hypothyroidism, unspecified: Secondary | ICD-10-CM | POA: Diagnosis not present

## 2016-10-12 DIAGNOSIS — Z9889 Other specified postprocedural states: Secondary | ICD-10-CM

## 2016-10-12 DIAGNOSIS — R197 Diarrhea, unspecified: Secondary | ICD-10-CM | POA: Diagnosis not present

## 2016-10-12 DIAGNOSIS — Z87891 Personal history of nicotine dependence: Secondary | ICD-10-CM | POA: Diagnosis not present

## 2016-10-12 DIAGNOSIS — Z0000 Encounter for general adult medical examination without abnormal findings: Secondary | ICD-10-CM | POA: Diagnosis not present

## 2016-10-12 DIAGNOSIS — G250 Essential tremor: Secondary | ICD-10-CM

## 2016-10-12 DIAGNOSIS — M8580 Other specified disorders of bone density and structure, unspecified site: Secondary | ICD-10-CM | POA: Diagnosis not present

## 2016-10-12 DIAGNOSIS — F419 Anxiety disorder, unspecified: Secondary | ICD-10-CM

## 2016-10-12 DIAGNOSIS — R413 Other amnesia: Secondary | ICD-10-CM | POA: Diagnosis not present

## 2016-10-12 DIAGNOSIS — F32A Depression, unspecified: Secondary | ICD-10-CM

## 2016-10-12 DIAGNOSIS — E784 Other hyperlipidemia: Secondary | ICD-10-CM | POA: Diagnosis not present

## 2016-10-12 DIAGNOSIS — F329 Major depressive disorder, single episode, unspecified: Secondary | ICD-10-CM

## 2016-10-12 DIAGNOSIS — E890 Postprocedural hypothyroidism: Secondary | ICD-10-CM | POA: Diagnosis not present

## 2016-10-12 LAB — POCT URINALYSIS DIPSTICK
Bilirubin, UA: NEGATIVE
Blood, UA: NEGATIVE
Glucose, UA: NEGATIVE
Ketones, UA: NEGATIVE
Leukocytes, UA: NEGATIVE
Nitrite, UA: NEGATIVE
Protein, UA: NEGATIVE
Spec Grav, UA: >=1.03 — AB (ref 1.010–1.025)
Urobilinogen, UA: 0.2 E.U./dL
pH, UA: 5 (ref 5.0–8.0)

## 2016-10-12 LAB — HEMOCCULT GUIAC POC 1CARD (OFFICE): Fecal Occult Blood, POC: NEGATIVE

## 2016-10-12 MED ORDER — VARENICLINE TARTRATE 0.5 MG PO TABS: 0.5000 mg | ORAL_TABLET | Freq: Two times a day (BID) | ORAL | 2 refills | Status: DC

## 2016-10-13 LAB — HEMOGLOBIN A1C
Hgb A1c MFr Bld: 4.9 % (ref <5.7)
Mean Plasma Glucose: 94 mg/dL

## 2016-10-14 LAB — FOLATE

## 2016-10-14 LAB — VITAMIN B12: Vitamin B-12: 750 pg/mL (ref 200–1100)

## 2016-10-18 ENCOUNTER — Other Ambulatory Visit: Payer: PPO | Admitting: Internal Medicine

## 2016-10-18 DIAGNOSIS — R718 Other abnormality of red blood cells: Secondary | ICD-10-CM

## 2016-10-19 LAB — FOLATE: Folate: 17.5 ng/mL (ref >5.4)

## 2016-10-26 ENCOUNTER — Ambulatory Visit (INDEPENDENT_AMBULATORY_CARE_PROVIDER_SITE_OTHER): Payer: PPO | Admitting: Internal Medicine

## 2016-10-26 ENCOUNTER — Encounter: Payer: Self-pay | Admitting: Internal Medicine

## 2016-10-26 VITALS — BP 100/68 | HR 79 | Temp 97.6°F | Wt 120.5 lb

## 2016-10-26 DIAGNOSIS — Z716 Tobacco abuse counseling: Secondary | ICD-10-CM

## 2016-10-26 DIAGNOSIS — D7589 Other specified diseases of blood and blood-forming organs: Secondary | ICD-10-CM | POA: Diagnosis not present

## 2016-10-26 DIAGNOSIS — R159 Full incontinence of feces: Secondary | ICD-10-CM

## 2016-10-26 DIAGNOSIS — K219 Gastro-esophageal reflux disease without esophagitis: Secondary | ICD-10-CM

## 2016-10-26 DIAGNOSIS — Z720 Tobacco use: Secondary | ICD-10-CM | POA: Diagnosis not present

## 2016-10-26 DIAGNOSIS — R150 Incomplete defecation: Secondary | ICD-10-CM

## 2016-10-26 MED ORDER — PANTOPRAZOLE SODIUM 40 MG PO TBEC: 40.0000 mg | DELAYED_RELEASE_TABLET | Freq: Every day | ORAL | 3 refills | Status: DC

## 2016-10-26 NOTE — Progress Notes (Signed)
   Subjective:    Patient ID: Megan Williams, female    DOB: [birth year], 71 y.o.   MRN: [id number]  HPI  71 year old Female for follow up of macrocytosis and constipation. She's been taking MiraLAX up until a couple of days ago when she felt nauseated. I think probably the Chantix is making her nauseated. Says it hasn't done that until 6 weeks into therapy in the past. The fecal incontinence stopped when the constipation was relieved.  She had normal B-12 and folate levels although she had an MCV of 104.5. She is not on any medication that would cause macrocytosis. She will return in 3 months for repeat CBC with differential.  She has not smoked since starting the Chantix.  Complains of fatigue but doesn't get much exercise. May sit 3 hours in a chair at night after dinner.  History of GE reflux treated by Dr. Watt Climes with Protonix and she needs refill    Review of Systems     Objective:   Physical Exam Not examined. Spent 25 minutes speaking with her today about these issues.Addressed smoking cessation and counseling, fatigue and importance of exercise, discussed at length elevated MCV and what that means. Addressed constipation and fecal incontinence.       Assessment & Plan:  Macrocytosis-this is odd because she had a normal CBC with normal MCV a month ago.  Nausea-relief last evening with Xanax. Suspect is due to Chantix and not MiraLAX  History of constipation and fecal incontinence related to constipation-suggest she continue MiraLAX  Plan: She'll return in 3 months for CBC with differential without office visit.  She requested I refill her Protonix which was done today.

## 2016-10-26 NOTE — Patient Instructions (Addendum)
RTC in 3 months for CBC with diff only without OV. Protonix refilled.

## 2016-10-27 ENCOUNTER — Ambulatory Visit (INDEPENDENT_AMBULATORY_CARE_PROVIDER_SITE_OTHER): Payer: PPO | Admitting: Neurology

## 2016-10-27 ENCOUNTER — Encounter: Payer: Self-pay | Admitting: Neurology

## 2016-10-27 VITALS — BP 130/79 | HR 80 | Ht 62.0 in | Wt 121.0 lb

## 2016-10-27 DIAGNOSIS — G43009 Migraine without aura, not intractable, without status migrainosus: Secondary | ICD-10-CM

## 2016-10-27 DIAGNOSIS — R251 Tremor, unspecified: Secondary | ICD-10-CM

## 2016-10-27 DIAGNOSIS — G3184 Mild cognitive impairment, so stated: Secondary | ICD-10-CM

## 2016-10-27 NOTE — Progress Notes (Signed)
PATIENT: Megan Williams DOB: [birth year]  REASON FOR VISIT: follow up HISTORY FROM: patient  HISTORY OF PRESENT ILLNESS: HISTORY Megan Williams): Megan Williams 71 years old right-handed female, seen in refer by her primary care physician Dr. Tedra Senegal for evaluation of tremor, memory loss, and gait problems   She had a history of hypertension, depression, anxiety, used to work as a Radio broadcast assistant, and church Glass blower/designer  Around 2015, she began to notice memory trouble, she needs family to remind her multiple times, tends to forget people's name, phone number, she used to be able to remember all the congregation's name in the past, her memory trouble since 4 gradually getting worse, she still driving without getting lost  She reported history of migraine since young, for a while in September to October 2015, she has migraines almost on a daily basis, which has improved after stopped taking Trileptal  She also reported mild bilateral hands tremor since 2014, most noticeable when she holding a utensil, or write with a pencil, she also noticed mild bilateral hands weakness, in Thanksgiving 2015, she has dropped her dishes to the floor, because of bilateral hands weakness,  Around 2015, she also noticed mild stiff unsteady gait, worsening urinary urgency, she denies significant neck pain, complains of moderate low back pain, she denies bilateral upper or lower extremity paresthesia  She is adopted, does not know family history, none of her children has tremor  MRI brain film in November 2016, mild generalized atrophy, mild supratentorium small vessel disease,   MRI of the cervical spine showed evidence of previous fusion from C4-7, 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   UPDATE October 27 2016: We reviewed the laboratory evaluation in August 2018, A1c was 4.9, normal folic acid, vitamin K09, TSH, fasting lipid profile, triglycerides 198, LDL 85, CMP showed  creatinine of 1.0, CBC showed MCV of 105,  She has retired, continue combating depression, not memory loss, especially when she is stressed, she is on polypharmacy treatment, Effexor, Wellbutrin, Xanax as needed trazodone as needed for sleep  She also has bilateral hands tremor, mild gait abnormality due to low back pain  REVIEW OF SYSTEMS: Out of a complete 14 system review of symptoms, the patient complains only of the following symptoms, and all other reviewed systems are negative. As above  ALLERGIES: Allergies  Allergen Reactions  . Doxycycline Other (See Comments)    Skin peeling   . Penicillins Hives    Has patient had a PCN reaction causing immediate rash, facial/tongue/throat swelling, SOB or lightheadedness with hypotension: No Has patient had a PCN reaction causing severe rash involving mucus membranes or skin necrosis: No Has patient had a PCN reaction that required hospitalization: No Has patient had a PCN reaction occurring within the last 10 years: No If all of the above answers are "NO", then may proceed with Cephalosporin use.  . Latex Rash    HOME MEDICATIONS: Outpatient Medications Prior to Visit  Medication Sig Dispense Refill  . ALPRAZolam (XANAX) 1 MG tablet Take 1 mg by mouth 2 (two) times daily as needed.     . Artificial Tear Ointment (REFRESH LACRI-LUBE) OINT Place 1 drop into both eyes at bedtime.    Marland Kitchen buPROPion (WELLBUTRIN XL) 300 MG 24 hr tablet Take 300 mg by mouth daily.     . Calcium-Vitamin D-Vitamin K (VIACTIV) 381-829-93 MG-UNT-MCG CHEW Chew 2 tablets by mouth daily.    . carboxymethylcellulose (REFRESH) 1 %  ophthalmic solution Place 1 drop into both eyes 4 (four) times daily.    . Cholecalciferol (VITAMIN D3) 5000 units TABS Take 5,000 Units by mouth daily.    . Cyanocobalamin (RA VITAMIN B-12 TR) 1000 MCG TBCR Take 1 tablet by mouth daily.    Marland Kitchen denosumab (PROLIA) 60 MG/ML SOLN injection Inject 60 mg into the skin every 6 (six) months. Administer  in upper arm, thigh, or abdomen    . diphenhydrAMINE (BENADRYL) 25 MG tablet Take 25 mg by mouth at bedtime as needed.    Marland Kitchen estradiol (ESTRACE) 0.5 MG tablet Take 1 tablet (0.5 mg total) by mouth daily. 90 tablet 4  . fexofenadine (ALLEGRA) 60 MG tablet Take 60 mg by mouth daily.      Marland Kitchen levothyroxine (SYNTHROID, LEVOTHROID) 88 MCG tablet TAKE 1 TABLET BY MOUTH DAILY. (Patient taking differently: TAKE 88 mcg TABLET BY MOUTH DAILY.) 90 tablet 1  . Magnesium Oxide (HM MAGNESIUM) 250 MG TABS Take 1 mg by mouth daily.    . memantine (NAMENDA) 10 MG tablet TAKE 1 TABLET BY MOUTH 2 TIMES DAILY. (Patient taking differently: TAKE 10 mg TABLET BY MOUTH 2 TIMES DAILY.) 60 tablet 11  . Multiple Vitamin (MULTIVITAMIN) tablet Take 1 tablet by mouth daily. Chewable centrum    . naproxen sodium (ANAPROX) 220 MG tablet Take 220 mg by mouth as needed.     . pantoprazole (PROTONIX) 40 MG tablet Take 1 tablet (40 mg total) by mouth daily. 90 tablet 3  . traZODone (DESYREL) 100 MG tablet Take 100-150 mg by mouth See admin instructions. Take 100 mg to 150 mg when needed    . valACYclovir (VALTREX) 500 MG tablet TAKE 1 TABLET BY MOUTH TWICE DAILY FOR 3 TO 5 DAYS. 30 tablet 0  . varenicline (CHANTIX) 0.5 MG tablet Take 1 tablet (0.5 mg total) by mouth 2 (two) times daily. 56 tablet 2  . Venlafaxine HCl 75 MG TB24 Take 75 mg by mouth 3 (three) times daily.   2   No facility-administered medications prior to visit.     PAST MEDICAL HISTORY: Past Medical History:  Diagnosis Date  . Allergy   . Anxiety   . COPD (chronic obstructive pulmonary disease) ([place])   . Depression   . Emphysema   . Esophagitis   . Herpes simplex   . Hypertension   . Migraines   . Osteopenia   . Vitamin D deficiency     PAST SURGICAL HISTORY: Past Surgical History:  Procedure Laterality Date  . BACK SURGERY    . BREAST EXCISIONAL BIOPSY Left    Benign   . CERVICAL FUSION    . CHOLECYSTECTOMY    . KYPHOPLASTY  09/19/2015   Dr.  Ellene Route  T12  . Thumb surg    . THYROIDECTOMY    . TONSILLECTOMY    . VAGINAL HYSTERECTOMY      FAMILY HISTORY: Family History  Problem Relation Age of Onset  . Adopted: Yes  . Family history unknown: Yes    SOCIAL HISTORY: Social History   Social History  . Marital status: Married    Spouse name: N/A  . Number of children: 2  . Years of education: 14   Occupational History  . Retired    Social History Main Topics  . Smoking status: Former Smoker    Packs/day: 1.00    Years: 35.00    Types: Cigarettes  . Smokeless tobacco: Never Used     Comment: 10/27/16 - currently taking  Chantix  . Alcohol use No  . Drug use: No  . Sexual activity: No     Comment: INTERCOURSE AGE 37, SEXUAL PARTNERS LESS THAN 5   Other Topics Concern  . Not on file   Social History Narrative   Lives at home with husband.   Right-handed.   1 cup caffeine daily.      PHYSICAL EXAM  Vitals:   10/27/16 0933  BP: 130/79  Pulse: 80  Weight: 121 lb (54.9 kg)  Height: 5\' 2"  (1.575 m)   Body mass index is 22.13 kg/m.  MMSE - Mini Mental State Exam 10/27/2016 04/28/2016 10/27/2015  Orientation to time 5 5 5   Orientation to Place 5 5 5   Registration 3 3 3   Attention/ Calculation 5 5 5   Recall 2 3 2   Language- name 2 objects 2 2 2   Language- repeat 1 1 1   Language- follow 3 step command 3 3 3   Language- read & follow direction 1 1 1   Write a sentence 1 1 1   Copy design 1 1 1   Total score 29 30 29     PHYSICAL EXAMNIATION:  Gen: NAD, conversant, well nourised, obese, well groomed                     Cardiovascular: Regular rate rhythm, no peripheral edema, warm, nontender. Eyes: Conjunctivae clear without exudates or hemorrhage Neck: Supple, no carotid bruits. Pulmonary: Clear to auscultation bilaterally     CRANIAL NERVES: CN II: Visual fields are full to confrontation. Fundoscopic exam is normal with sharp discs and no vascular changes. Pupils are round equal and briskly reactive  to light. CN III, IV, VI: extraocular movement are normal. No ptosis. CN V: Facial sensation is intact to pinprick in all 3 divisions bilaterally. Corneal responses are intact.  CN VII: Face is symmetric with normal eye closure and smile. CN VIII: Hearing is normal to rubbing fingers CN IX, X: Palate elevates symmetrically. Phonation is normal. CN XI: Head turning and shoulder shrug are intact CN XII: Tongue is midline with normal movements and no atrophy.  MOTOR: There is no pronator drift of out-stretched arms. Muscle bulk and tone are normal. Muscle strength is normal.  REFLEXES: Reflexes are 2+ and symmetric at the biceps, triceps, knees, and ankles. Plantar responses are flexor.  SENSORY: Intact to light touch, pinprick, positional and vibratory sensation are intact in fingers and toes.  COORDINATION: Rapid alternating movements and fine finger movements are intact. There is no dysmetria on finger-to-nose and heel-knee-shin.    GAIT/STANCE: Posture is normal. Gait is steady with normal steps, base, arm swing, and turning. Heel and toe walking are normal. Tandem gait is normal.  Romberg is absent.    DIAGNOSTIC DATA (LABS, IMAGING, TESTING) - I reviewed patient records, labs, notes, testing and imaging myself where available.  Lab Results  Component Value Date   WBC 7.3 10/07/2016   HGB 13.7 10/07/2016   HCT 41.5 10/07/2016   MCV 104.5 (H) 10/07/2016   PLT 240 10/07/2016      Component Value Date/Time   NA 138 10/07/2016 0925   K 4.2 10/07/2016 0925   CL 101 10/07/2016 0925   CO2 28 10/07/2016 0925   GLUCOSE 100 (H) 10/07/2016 0925   BUN 12 10/07/2016 0925   CREATININE 1.01 (H) 10/07/2016 0925   CALCIUM 9.6 10/07/2016 0925   PROT 6.6 10/07/2016 0925   ALBUMIN 4.0 10/07/2016 0925   AST 17 10/07/2016 0925   ALT  11 10/07/2016 0925   ALKPHOS 49 10/07/2016 0925   BILITOT 0.5 10/07/2016 0925   GFRNONAA 57 (L) 10/07/2016 0925   GFRAA 66 10/07/2016 0925   Lab  Results  Component Value Date   CHOL 198 10/07/2016   HDL 73 10/07/2016   LDLCALC 85 10/07/2016   TRIG 198 (H) 10/07/2016   CHOLHDL 2.7 10/07/2016   Lab Results  Component Value Date   HGBA1C 4.9 10/07/2016   Lab Results  Component Value Date   VITAMINB12 750 10/07/2016   Lab Results  Component Value Date   TSH 2.40 10/07/2016      ASSESSMENT AND PLAN 71 y.o. year old female   Posturing tremor  No evidence of parkinsonian features, can be related to her anxiety, and also polypharmacy treatment,  She is taking Xanax as needed  Mild cognitive impairment  She is taking Namenda 10 mg twice a day, no significant side effect or improvement noted,  She is already on polypharmacy treatment, advised her she may give a trial of stopping Namenda, if she has notice any difference she may go back on it again  Her complaints of memory loss can be related to her depression  We have reviewed MRI of the brain 2016, mild generalized atrophy supratentorium small vessel disease, no treatable etiology found, she is adopted,   She is to continue moderate exercise   Marcial Pacas, M.D. Ph.D.  [facility] [place], [place] [address] Phone: [phone] Fax:      [phone]

## 2016-10-29 NOTE — Patient Instructions (Signed)
Start Chantix. Take laxative to get on constipated and then start MiraLAX daily. Follow-up in a couple of weeks. Continue other medications as previously prescribed.

## 2016-10-29 NOTE — Progress Notes (Addendum)
Subjective:    Patient ID: Megan Williams, female    DOB: [birth year], 71 y.o.   MRN: [id number]  HPI 71 year old female in today for health maintenance exam and evaluation of multiple medical issues. Has had issues recently with fecal incontinence. I'm suspecting she is constipated. She will have KUB flat and upright for confirmation.  History of T12 compression fracture in 2017 seen by Dr. Ellene Route after a fall in the shower.  In 2012 she had L4-L5 decompression stabilization by Dr. Ellene Route. Cervical spine 3 level fusion C4-C7 done by Dr. Ellene Route in 2005.  Joint reconstruction of right thumb 2008 by Dr. Daylene Katayama  History of GE reflux  History of hypertriglyceridemia  History of COPD exacerbations on occasion with bronchitis. History of allergic rhinitis.  Social history: She is married. 2 adult children. 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  She sees nurse practitioner for GYN care.  Continues to smoke but was to start Chantix once again  Has been diagnosed with tremor by a neurologist and mild cognitive impairment. She does seem forgetful. She has a history of anxiety depression. History of hypothyroidism treated with thyroid replacement.  Around 2016 she had a low B-12 level in the low 200 range that improved with oral B-12 supplement.  Colonoscopy done by Dr. Collene Mares in 2005.  Family history is unknown since she is adopted  Zostavax vaccine December 2012  Thyroidectomy 1968. Tonsillectomy 1963. Hysterectomy without oophorectomy 1979. Cholecystectomy 1993.    Review of Systems see above     Objective:   Physical Exam  Constitutional: She is oriented to person, place, and time. She appears well-developed and well-nourished. No distress.  HENT:  Head: Normocephalic and atraumatic.  Right Ear: External ear normal.  Left Ear: External ear normal.    Mouth/Throat: Oropharynx is clear and moist.  Eyes: Pupils are equal, round, and reactive to light. Conjunctivae and EOM are normal. Right eye exhibits no discharge. Left eye exhibits no discharge. No scleral icterus.  Neck: Neck supple. No JVD present. No thyromegaly present.  Cardiovascular: Normal rate, regular rhythm, normal heart sounds and intact distal pulses.   No murmur heard. Pulmonary/Chest: Effort normal and breath sounds normal. No respiratory distress. She has no wheezes. She has no rales. She exhibits no tenderness.  Breasts normal female  Abdominal: Soft. Bowel sounds are normal. She exhibits no distension and no mass. There is no tenderness. There is no rebound and no guarding.  Genitourinary:  Genitourinary Comments: Deferred to GYN  Musculoskeletal: She exhibits no edema.  Lymphadenopathy:    She has no cervical adenopathy.  Neurological: She is alert and oriented to person, place, and time. She has normal reflexes. No cranial nerve deficit. Coordination normal.  Skin: Skin is warm and dry. No rash noted. She is not diaphoretic.  Psychiatric: She has a normal mood and affect. Her behavior is normal. Judgment and thought content normal.  Vitals reviewed.         Assessment & Plan:  Mild cognitive impairment  Hypothyroidism  Tremor-benign essential  History of lumbar spinal surgery  History of cervical spine surgery  History of T12 compression fracture  History of smoking-wants to restart Chantix  Fecal incontinence-suspect constipation-KUB shows moderate stool burn throughout the colon. Recommended laxative followed by daily MiraLAX  Osteopenia  Anxiety depression  History of migraine headaches  GERD  History of cholecystectomy  Plan: Follow-up with regard fecal incontinence a couple of weeks-needs to get constipation cleared up and start daily MiraLAX and continue other medications as previously prescribed. Chantix restarted.  Subjective:    Patient presents for Medicare Annual/Subsequent preventive examination.  Review Past Medical/Family/Social:see above   Risk Factors  Current exercise habits: not alot Dietary issues discussed: low fat low carb  Cardiac risk factors:smoking  Depression Screen  (Note: if answer to either of the following is "Yes", a more complete depression screening is indicated)   Over the past two weeks, have you felt down, depressed or hopeless? No  Over the past two weeks, have you felt little interest or pleasure in doing things? No Have you lost interest or pleasure in daily life? No Do you often feel hopeless? No Do you cry easily over simple problems? No   Activities of Daily Living  In your present state of health, do you have any difficulty performing the following activities?:   Driving? No  Managing money? No  Feeding yourself? No  Getting from bed to chair? No  Climbing a flight of stairs? No  Preparing food and eating?: No  Bathing or showering? No  Getting dressed: No  Getting to the toilet? No  Using the toilet:No  Moving around from place to place: No  In the past year have you fallen or had a near fall?:yes Are you sexually active? No  Do you have more than one partner? No   Hearing Difficulties: No  Do you often ask people to speak up or repeat themselves? sometimes Do you experience ringing or noises in your ears? No  Do you have difficulty understanding soft or whispered voices? No  Do you feel that you have a problem with memory? occasionally Do you often misplace items? No    Home Safety:  Do you have a smoke alarm at your residence? Yes Do you have grab bars in the bathroom?no Do you have throw rugs in your house?no   Cognitive Testing  Alert? Yes Normal Appearance?Yes  Oriented to person? Yes Place? Yes  Time? Yes  Recall of three objects? Not tested  Can perform simple calculations? Not tested Displays appropriate judgment?Yes  Can read the correct  time from a watch face?Yes   List the Names of Other Physician/Practitioners you currently use:  See referral list for the physicians patient is currently seeing.   Psychiatry and neurology  Review of Systems:see above   Objective:     General appearance: Appears stated age  Head: Normocephalic, without obvious abnormality, atraumatic  Eyes: conj clear, EOMi PEERLA  Ears: normal TM's and external ear canals both ears  Nose: Nares normal. Septum midline. Mucosa normal. No drainage or sinus tenderness.  Throat: lips, mucosa, and tongue normal; teeth and gums normal  Neck: no adenopathy, no carotid bruit, no JVD, supple, symmetrical, trachea midline and thyroid not enlarged, symmetric, no tenderness/mass/nodules  No CVA tenderness.  Lungs: clear to auscultation bilaterally  Breasts: normal appearance, no masses or tenderness Heart: regular rate and rhythm, S1, S2 normal, no murmur, click, rub or gallop  Abdomen: soft, non-tender; bowel sounds normal; no masses, no organomegaly  Musculoskeletal: ROM normal in all joints, no crepitus, no deformity, Normal muscle strengthen. Back  is symmetric, no curvature. Skin: Skin color, texture, turgor normal. No rashes or lesions  Lymph nodes: Cervical, supraclavicular, and axillary nodes normal.  Neurologic: CN 2 -12 Normal, Normal symmetric reflexes. Normal coordination and gait , tremor Psych:  Alert & Oriented x 3, Mood appear stable.    Assessment:    Annual wellness medicare exam   Plan:    During the course of the visit the patient was educated and counseled about appropriate screening and preventive services including:   Annual mammogram  Colonoscopy up-to-date     Patient Instructions (the written plan) was given to the patient.  Medicare Attestation  I have personally reviewed:  The patient's medical and social history  Their use of alcohol, tobacco or illicit drugs  Their current medications and supplements  The  patient's functional ability including ADLs,fall risks, home safety risks, cognitive, and hearing and visual impairment  Diet and physical activities  Evidence for depression or mood disorders  The patient's weight, height, BMI, and visual acuity have been recorded in the chart. I have made referrals, counseling, and provided education to the patient based on review of the above and I have provided the patient with a written personalized care plan for preventive services.

## 2016-11-18 DIAGNOSIS — R159 Full incontinence of feces: Secondary | ICD-10-CM | POA: Diagnosis not present

## 2016-11-18 DIAGNOSIS — K529 Noninfective gastroenteritis and colitis, unspecified: Secondary | ICD-10-CM | POA: Diagnosis not present

## 2016-11-24 DIAGNOSIS — F411 Generalized anxiety disorder: Secondary | ICD-10-CM | POA: Diagnosis not present

## 2016-11-24 DIAGNOSIS — F429 Obsessive-compulsive disorder, unspecified: Secondary | ICD-10-CM | POA: Diagnosis not present

## 2016-11-24 DIAGNOSIS — F3341 Major depressive disorder, recurrent, in partial remission: Secondary | ICD-10-CM | POA: Diagnosis not present

## 2016-11-26 DIAGNOSIS — R197 Diarrhea, unspecified: Secondary | ICD-10-CM | POA: Diagnosis not present

## 2016-11-26 DIAGNOSIS — K5730 Diverticulosis of large intestine without perforation or abscess without bleeding: Secondary | ICD-10-CM | POA: Diagnosis not present

## 2016-11-30 DIAGNOSIS — R197 Diarrhea, unspecified: Secondary | ICD-10-CM | POA: Diagnosis not present

## 2016-12-23 ENCOUNTER — Ambulatory Visit (INDEPENDENT_AMBULATORY_CARE_PROVIDER_SITE_OTHER): Payer: PPO | Admitting: Internal Medicine

## 2016-12-23 DIAGNOSIS — E559 Vitamin D deficiency, unspecified: Secondary | ICD-10-CM | POA: Diagnosis not present

## 2016-12-23 DIAGNOSIS — Z23 Encounter for immunization: Secondary | ICD-10-CM

## 2016-12-23 DIAGNOSIS — M810 Age-related osteoporosis without current pathological fracture: Secondary | ICD-10-CM | POA: Diagnosis not present

## 2016-12-23 NOTE — Progress Notes (Signed)
Flu shot given

## 2016-12-23 NOTE — Patient Instructions (Signed)
Flu shot given

## 2017-01-04 ENCOUNTER — Other Ambulatory Visit: Payer: Self-pay | Admitting: Internal Medicine

## 2017-01-04 MED ORDER — VARENICLINE TARTRATE 1 MG PO TABS: 1.0000 mg | ORAL_TABLET | Freq: Two times a day (BID) | ORAL | 0 refills | Status: DC

## 2017-01-04 NOTE — Telephone Encounter (Signed)
Change to 1 mg dose

## 2017-01-17 ENCOUNTER — Telehealth: Payer: Self-pay | Admitting: Neurology

## 2017-01-17 MED ORDER — MEMANTINE HCL 10 MG PO TABS: 10.0000 mg | ORAL_TABLET | Freq: Two times a day (BID) | ORAL | 11 refills | Status: DC

## 2017-01-17 NOTE — Addendum Note (Signed)
Addended by: Noberto Retort C on: 01/17/2017 12:02 PM   Modules accepted: Orders

## 2017-01-17 NOTE — Telephone Encounter (Signed)
Pt calling asking for a call back re: her memantine (NAMENDA) 10 MG tablet she states that she wants to know if Dr [name] will okay her going back on her memantine (NAMENDA) 10 MG tablet, she states from when she stopped taking it last she has noticed a major difference.  Please call

## 2017-01-17 NOTE — Telephone Encounter (Signed)
Per last office note, trial off Namenda but can restart if patient feels it was helpful.  New rx sent to pharmacy.  Spoke to patient and she will start the medication again.

## 2017-01-25 ENCOUNTER — Other Ambulatory Visit: Payer: PPO | Admitting: Internal Medicine

## 2017-01-26 ENCOUNTER — Other Ambulatory Visit: Payer: Self-pay | Admitting: Internal Medicine

## 2017-01-26 DIAGNOSIS — R159 Full incontinence of feces: Secondary | ICD-10-CM | POA: Diagnosis not present

## 2017-01-26 DIAGNOSIS — K529 Noninfective gastroenteritis and colitis, unspecified: Secondary | ICD-10-CM | POA: Diagnosis not present

## 2017-01-28 ENCOUNTER — Other Ambulatory Visit (INDEPENDENT_AMBULATORY_CARE_PROVIDER_SITE_OTHER): Payer: PPO | Admitting: Internal Medicine

## 2017-01-28 DIAGNOSIS — D7589 Other specified diseases of blood and blood-forming organs: Secondary | ICD-10-CM | POA: Diagnosis not present

## 2017-01-28 LAB — CBC WITH DIFFERENTIAL/PLATELET
Basophils Absolute: 32 cells/uL (ref 0–200)
Basophils Relative: 0.5 %
Eosinophils Absolute: 38 cells/uL (ref 15–500)
Eosinophils Relative: 0.6 %
HCT: 37.2 % (ref 35.0–45.0)
Hemoglobin: 12.6 g/dL (ref 11.7–15.5)
Lymphs Abs: 2445 cells/uL (ref 850–3900)
MCH: 33.2 pg — ABNORMAL HIGH (ref 27.0–33.0)
MCHC: 33.9 g/dL (ref 32.0–36.0)
MCV: 98.2 fL (ref 80.0–100.0)
MPV: 9.6 fL (ref 7.5–12.5)
Monocytes Relative: 6.5 %
Neutro Abs: 3469 cells/uL (ref 1500–7800)
Neutrophils Relative %: 54.2 %
Platelets: 204 10*3/uL (ref 140–400)
RBC: 3.79 10*6/uL — ABNORMAL LOW (ref 3.80–5.10)
RDW: 12.6 % (ref 11.0–15.0)
Total Lymphocyte: 38.2 %
WBC mixed population: 416 cells/uL (ref 200–950)
WBC: 6.4 10*3/uL (ref 3.8–10.8)

## 2017-02-09 DIAGNOSIS — N3281 Overactive bladder: Secondary | ICD-10-CM | POA: Diagnosis not present

## 2017-02-09 DIAGNOSIS — R351 Nocturia: Secondary | ICD-10-CM | POA: Diagnosis not present

## 2017-03-15 ENCOUNTER — Telehealth: Payer: Self-pay

## 2017-03-15 NOTE — Telephone Encounter (Signed)
Patient called states her right wrist and forearm have been been hurting for 3 weeks now she said as soon as she starts moving and doing stuff with her arm it starts to hurt she thinks she has "tendonitis". She would like to know what  To do. Also she said her left hip has been hurting for 4 months she said the pain had gone away but it's back now she would like to know if she needs to contact her neurosurgeon about that?

## 2017-03-15 NOTE — Telephone Encounter (Signed)
She should see the orthopedist of her choice for arm and hip pain before seeing neurosurgeon

## 2017-03-16 NOTE — Telephone Encounter (Signed)
Spoke with patient and advised that she should contact Orthopedist.  States that she doesn't know who she saw last.  She see's Dr. Layne Benton for Prolia injections at [facility].  Advised that she should contact their office since she's already established there and they will be happy to set her up with someone I'm sure.  Advised to ask for Landau or Alfonso Ramus and even if she had to see their PA.  She's fine with seeing PA.  Patient verbalized understanding of our conversation.

## 2017-03-18 DIAGNOSIS — M25552 Pain in left hip: Secondary | ICD-10-CM | POA: Diagnosis not present

## 2017-03-18 DIAGNOSIS — M545 Low back pain: Secondary | ICD-10-CM | POA: Diagnosis not present

## 2017-03-24 DIAGNOSIS — F3342 Major depressive disorder, recurrent, in full remission: Secondary | ICD-10-CM | POA: Diagnosis not present

## 2017-03-24 DIAGNOSIS — F429 Obsessive-compulsive disorder, unspecified: Secondary | ICD-10-CM | POA: Diagnosis not present

## 2017-03-24 DIAGNOSIS — G3184 Mild cognitive impairment, so stated: Secondary | ICD-10-CM | POA: Diagnosis not present

## 2017-03-28 ENCOUNTER — Other Ambulatory Visit: Payer: Self-pay | Admitting: Gynecology

## 2017-03-28 DIAGNOSIS — B009 Herpesviral infection, unspecified: Secondary | ICD-10-CM

## 2017-03-29 ENCOUNTER — Other Ambulatory Visit: Payer: Self-pay | Admitting: Women's Health

## 2017-03-29 DIAGNOSIS — Z1231 Encounter for screening mammogram for malignant neoplasm of breast: Secondary | ICD-10-CM

## 2017-04-14 ENCOUNTER — Ambulatory Visit
Admission: RE | Admit: 2017-04-14 | Discharge: 2017-04-14 | Disposition: A | Discharge status: Home / Self Care | Payer: PPO | Source: Ambulatory Visit | Attending: Women's Health | Admitting: Women's Health

## 2017-04-14 DIAGNOSIS — Z1231 Encounter for screening mammogram for malignant neoplasm of breast: Secondary | ICD-10-CM

## 2017-04-15 ENCOUNTER — Encounter: Payer: Self-pay | Admitting: Women's Health

## 2017-04-15 ENCOUNTER — Ambulatory Visit: Payer: PPO

## 2017-04-20 ENCOUNTER — Encounter: Payer: Self-pay | Admitting: Adult Health

## 2017-04-20 DIAGNOSIS — K529 Noninfective gastroenteritis and colitis, unspecified: Secondary | ICD-10-CM | POA: Diagnosis not present

## 2017-05-11 ENCOUNTER — Ambulatory Visit: Payer: PPO | Admitting: Internal Medicine

## 2017-05-12 ENCOUNTER — Ambulatory Visit (INDEPENDENT_AMBULATORY_CARE_PROVIDER_SITE_OTHER): Payer: PPO | Admitting: Internal Medicine

## 2017-05-12 ENCOUNTER — Encounter: Payer: Self-pay | Admitting: Internal Medicine

## 2017-05-12 VITALS — BP 120/80 | HR 88 | Temp 98.8°F | Ht 62.0 in | Wt 124.0 lb

## 2017-05-12 DIAGNOSIS — J0100 Acute maxillary sinusitis, unspecified: Secondary | ICD-10-CM | POA: Diagnosis not present

## 2017-05-12 DIAGNOSIS — J329 Chronic sinusitis, unspecified: Secondary | ICD-10-CM | POA: Diagnosis not present

## 2017-05-12 MED ORDER — CEFTRIAXONE SODIUM 1 G IJ SOLR
1.0000 g | Freq: Once | INTRAMUSCULAR | Status: AC
  Administered 2017-05-12: 1 g via INTRAMUSCULAR

## 2017-05-12 MED ORDER — AZITHROMYCIN 250 MG PO TABS: ORAL_TABLET | ORAL | 0 refills | Status: DC

## 2017-05-12 NOTE — Patient Instructions (Signed)
Zithromax Z pak 2 po day1 followed by one po days 2-5. Rocephin one gram IM

## 2017-05-12 NOTE — Progress Notes (Signed)
   Subjective:    Patient ID: Megan Williams, female    DOB: 09-05-1945, 72 y.o.   MRN: [id number]  HPI 72 year old Female onset of sore throat 2 days ago.Low grade fever. Nasal congestion with green discharge. Has headache and right ear pain.    Her dog bit her right index finger.  It was a superficial bite.  She took care of it herself.  He has had a rabies vaccine and is up-to-date.    Review of Systems see above.     Objective:   Physical Exam Bite right index finger is healing well.  Tetanus immunization is up-to-date. TMs are clear.  She sounds nasally congested when she speaks.  Pharynx slightly injected.  Neck supple without adenopathy.  Chest clear to auscultation.      Assessment & Plan:  Acute maxillary sinusitis  Animal bite right index finger-healing  Plan: Rocephin 1 g IM.  Zithromax Z-Pak take 2 tablets day 1 followed by 1 tablet days 2 through 5.

## 2017-05-23 ENCOUNTER — Encounter: Payer: Self-pay | Admitting: Neurology

## 2017-05-23 ENCOUNTER — Ambulatory Visit (INDEPENDENT_AMBULATORY_CARE_PROVIDER_SITE_OTHER): Payer: PPO | Admitting: Neurology

## 2017-05-23 VITALS — BP 128/71 | HR 88 | Ht 62.0 in | Wt 121.5 lb

## 2017-05-23 DIAGNOSIS — R251 Tremor, unspecified: Secondary | ICD-10-CM

## 2017-05-23 DIAGNOSIS — F09 Unspecified mental disorder due to known physiological condition: Secondary | ICD-10-CM | POA: Insufficient documentation

## 2017-05-23 MED ORDER — MEMANTINE HCL 10 MG PO TABS: 10.0000 mg | ORAL_TABLET | Freq: Two times a day (BID) | ORAL | 4 refills | Status: DC

## 2017-05-23 NOTE — Progress Notes (Signed)
PATIENT: Megan Williams DOB: Dec 21, 1945  REASON FOR VISIT: follow up HISTORY FROM: patient  HISTORY OF PRESENT ILLNESS: HISTORY [name]: DMIYA MALPHRUS 72 years old right-handed female, seen in refer by her primary care physician Dr. Tedra Senegal for evaluation of tremor, memory loss, and gait problems   She had a history of hypertension, depression, anxiety, used to work as a Radio broadcast assistant, and church Glass blower/designer  Around 2015, she began to notice memory trouble, she needs family to remind her multiple times, tends to forget people's name, phone number, she used to be able to remember all the congregation's name in the past, her memory trouble since 2 gradually getting worse, she still driving without getting lost  She reported history of migraine since young, for a while in September to October 2015, she has migraines almost on a daily basis, which has improved after stopped taking Trileptal  She also reported mild bilateral hands tremor since 2014, most noticeable when she holding a utensil, or write with a pencil, she also noticed mild bilateral hands weakness, in Thanksgiving 2015, she has dropped her dishes to the floor, because of bilateral hands weakness,  Around 2015, she also noticed mild stiff unsteady gait, worsening urinary urgency, she denies significant neck pain, complains of moderate low back pain, she denies bilateral upper or lower extremity paresthesia  She is adopted, does not know family history, none of her children has tremor  MRI brain film in November 2016, mild generalized atrophy, mild supratentorium small vessel disease,   MRI of the cervical spine showed evidence of previous fusion from C4-7, 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   UPDATE October 27 2016: We reviewed the laboratory evaluation in August 2018, A1c was 4.9, normal folic acid, vitamin B15, TSH, fasting lipid profile, triglycerides 198, LDL 85, CMP showed  creatinine of 1.0, CBC showed MCV of 105,  She has retired, continue combating depression, not memory loss, especially when she is stressed, she is on polypharmacy treatment, Effexor, Wellbutrin, Xanax as needed trazodone as needed for sleep  She also has bilateral hands tremor, mild gait abnormality due to low back pain  Update May 23, 2017: She still noticed mild memory loss, but overall doing well. She is now retired, Tour manager, Mini-Mental Status Examination was 30 out of 30 today, still on polypharmacy treatment, trazodone, Effexor, Xanax, Wellbutrin   REVIEW OF SYSTEMS: Out of a complete 14 system review of symptoms, the patient complains only of the following symptoms, and all other reviewed systems are negative. As above  ALLERGIES: Allergies  Allergen Reactions  . Doxycycline Other (See Comments)    Skin peeling   . Penicillins Hives    Has patient had a PCN reaction causing immediate rash, facial/tongue/throat swelling, SOB or lightheadedness with hypotension: No Has patient had a PCN reaction causing severe rash involving mucus membranes or skin necrosis: No Has patient had a PCN reaction that required hospitalization: No Has patient had a PCN reaction occurring within the last 10 years: No If all of the above answers are "NO", then may proceed with Cephalosporin use.  . Latex Rash    HOME MEDICATIONS: Outpatient Medications Prior to Visit  Medication Sig Dispense Refill  . ALPRAZolam (XANAX) 1 MG tablet Take 1 mg by mouth 2 (two) times daily as needed.     . Artificial Tear Ointment (REFRESH LACRI-LUBE) OINT Place 1 drop into both eyes at bedtime.    Marland Kitchen  buPROPion (WELLBUTRIN XL) 300 MG 24 hr tablet Take 300 mg by mouth daily.     . Calcium-Vitamin D-Vitamin K (VIACTIV) 751-700-17 MG-UNT-MCG CHEW Chew 2 tablets by mouth daily.    . carboxymethylcellulose (REFRESH) 1 % ophthalmic solution Place 1 drop into both eyes 4 (four) times daily.    . Cyanocobalamin  (RA VITAMIN B-12 TR) 1000 MCG TBCR Take 1 tablet by mouth daily.    Marland Kitchen denosumab (PROLIA) 60 MG/ML SOLN injection Inject 60 mg into the skin every 6 (six) months. Administer in upper arm, thigh, or abdomen    . diphenhydrAMINE (BENADRYL) 25 MG tablet Take 25 mg by mouth at bedtime as needed.    Marland Kitchen estradiol (ESTRACE) 0.5 MG tablet Take 1 tablet (0.5 mg total) by mouth daily. 90 tablet 4  . fexofenadine (ALLEGRA) 60 MG tablet Take 60 mg by mouth daily.      Marland Kitchen levothyroxine (SYNTHROID, LEVOTHROID) 88 MCG tablet TAKE 1 TABLET BY MOUTH DAILY. 90 tablet 1  . Magnesium Oxide (HM MAGNESIUM) 250 MG TABS Take 1 mg by mouth daily.    . memantine (NAMENDA) 10 MG tablet Take 1 tablet (10 mg total) 2 (two) times daily by mouth. TAKE 10 mg TABLET BY MOUTH 2 TIMES DAILY. 60 tablet 11  . Multiple Vitamin (MULTIVITAMIN) tablet Take 1 tablet by mouth daily. Chewable centrum    . naproxen sodium (ANAPROX) 220 MG tablet Take 220 mg by mouth as needed.     . pantoprazole (PROTONIX) 40 MG tablet Take 1 tablet (40 mg total) by mouth daily. 90 tablet 3  . traZODone (DESYREL) 100 MG tablet Take 100-150 mg by mouth See admin instructions. Take 100 mg to 150 mg when needed    . valACYclovir (VALTREX) 500 MG tablet TAKE 1 TABLET BY MOUTH TWICE DAILY FOR 3 TO 5 DAYS. 30 tablet 6  . venlafaxine XR (EFFEXOR-XR) 150 MG 24 hr capsule Take 150 mg by mouth daily with breakfast.    . azithromycin (ZITHROMAX) 250 MG tablet 2 po day 1 followed by one po days 2-5 6 tablet 0  . Cholecalciferol (VITAMIN D3) 5000 units TABS Take 5,000 Units by mouth daily.    . varenicline (CHANTIX CONTINUING MONTH PAK) 1 MG tablet Take 1 tablet (1 mg total) 2 (two) times daily by mouth. 60 tablet 0  . varenicline (CHANTIX) 0.5 MG tablet Take 1 tablet (0.5 mg total) by mouth 2 (two) times daily. 56 tablet 2   No facility-administered medications prior to visit.     PAST MEDICAL HISTORY: Past Medical History:  Diagnosis Date  . Allergy   . Anxiety     . COPD (chronic obstructive pulmonary disease) ([place])   . Depression   . Emphysema   . Esophagitis   . Herpes simplex   . Hypertension   . Migraines   . Osteopenia   . Vitamin D deficiency     PAST SURGICAL HISTORY: Past Surgical History:  Procedure Laterality Date  . BACK SURGERY    . BREAST EXCISIONAL BIOPSY Left 1979   Benign   . CERVICAL FUSION    . CHOLECYSTECTOMY    . KYPHOPLASTY  09/19/2015   Dr. Ellene Route  T12  . Thumb surg    . THYROIDECTOMY    . TONSILLECTOMY    . VAGINAL HYSTERECTOMY      FAMILY HISTORY: Family History  Adopted: Yes  Family history unknown: Yes    SOCIAL HISTORY: Social History   Socioeconomic History  . Marital  status: Married    Spouse name: Not on file  . Number of children: 2  . Years of education: 68  . Highest education level: Not on file  Occupational History  . Occupation: Retired  Scientific laboratory technician  . Financial resource strain: Not on file  . Food insecurity:    Worry: Not on file    Inability: Not on file  . Transportation needs:    Medical: Not on file    Non-medical: Not on file  Tobacco Use  . Smoking status: Former Smoker    Packs/day: 1.00    Years: 35.00    Pack years: 35.00    Types: Cigarettes  . Smokeless tobacco: Never Used  . Tobacco comment: 10/27/16 - currently taking Chantix  Substance and Sexual Activity  . Alcohol use: No    Alcohol/week: 0.0 oz  . Drug use: No  . Sexual activity: Never    Birth control/protection: Surgical    Comment: INTERCOURSE AGE 59, SEXUAL PARTNERS LESS THAN 5  Lifestyle  . Physical activity:    Days per week: Not on file    Minutes per session: Not on file  . Stress: Not on file  Relationships  . Social connections:    Talks on phone: Not on file    Gets together: Not on file    Attends religious service: Not on file    Active member of club or organization: Not on file    Attends meetings of clubs or organizations: Not on file    Relationship status: Not on file  .  Intimate partner violence:    Fear of current or ex partner: Not on file    Emotionally abused: Not on file    Physically abused: Not on file    Forced sexual activity: Not on file  Other Topics Concern  . Not on file  Social History Narrative   Lives at home with husband.   Right-handed.   1 cup caffeine daily.      PHYSICAL EXAM  Vitals:   05/23/17 1034  BP: 128/71  Pulse: 88  Weight: 121 lb 8 oz (55.1 kg)  Height: 5\' 2"  (1.575 m)   Body mass index is 22.22 kg/m.  MMSE - Mini Mental State Exam 05/23/2017 10/27/2016 04/28/2016  Orientation to time 5 5 5   Orientation to Place 5 5 5   Registration 3 3 3   Attention/ Calculation 5 5 5   Recall 3 2 3   Language- name 2 objects 2 2 2   Language- repeat 1 1 1   Language- follow 3 step command 3 3 3   Language- read & follow direction 1 1 1   Write a sentence 1 1 1   Copy design 1 1 1   Total score 30 29 30     PHYSICAL EXAMNIATION:  Gen: NAD, conversant, well nourised, obese, well groomed                     Cardiovascular: Regular rate rhythm, no peripheral edema, warm, nontender. Eyes: Conjunctivae clear without exudates or hemorrhage Neck: Supple, no carotid bruits. Pulmonary: Clear to auscultation bilaterally   MMSE - Mini Mental State Exam 05/23/2017 10/27/2016 04/28/2016  Orientation to time 5 5 5   Orientation to Place 5 5 5   Registration 3 3 3   Attention/ Calculation 5 5 5   Recall 3 2 3   Language- name 2 objects 2 2 2   Language- repeat 1 1 1   Language- follow 3 step command 3 3 3   Language- read & follow  direction 1 1 1   Write a sentence 1 1 1   Copy design 1 1 1   Total score 30 29 30    Animal naming 8  CRANIAL NERVES: CN II: Visual fields are full to confrontation. Fundoscopic exam is normal with sharp discs and no vascular changes. Pupils are round equal and briskly reactive to light. CN III, IV, VI: extraocular movement are normal. No ptosis. CN V: Facial sensation is intact to pinprick in all 3 divisions  bilaterally. Corneal responses are intact.  CN VII: Face is symmetric with normal eye closure and smile. CN VIII: Hearing is normal to rubbing fingers CN IX, X: Palate elevates symmetrically. Phonation is normal. CN XI: Head turning and shoulder shrug are intact CN XII: Tongue is midline with normal movements and no atrophy.  MOTOR: There is no pronator drift of out-stretched arms. Muscle bulk and tone are normal. Muscle strength is normal.  REFLEXES: Reflexes are 2+ and symmetric at the biceps, triceps, knees, and ankles. Plantar responses are flexor.  SENSORY: Intact to light touch, pinprick, positional and vibratory sensation are intact in fingers and toes.  COORDINATION: Rapid alternating movements and fine finger movements are intact. There is no dysmetria on finger-to-nose and heel-knee-shin.    GAIT/STANCE: Posture is normal. Gait is steady with normal steps, base, arm swing, and turning. Heel and toe walking are normal. Tandem gait is normal.  Romberg is absent.    DIAGNOSTIC DATA (LABS, IMAGING, TESTING) - I reviewed patient records, labs, notes, testing and imaging myself where available.  Lab Results  Component Value Date   WBC 6.4 01/28/2017   HGB 12.6 01/28/2017   HCT 37.2 01/28/2017   MCV 98.2 01/28/2017   PLT 204 01/28/2017      Component Value Date/Time   NA 138 10/07/2016 0925   K 4.2 10/07/2016 0925   CL 101 10/07/2016 0925   CO2 28 10/07/2016 0925   GLUCOSE 100 (H) 10/07/2016 0925   BUN 12 10/07/2016 0925   CREATININE 1.01 (H) 10/07/2016 0925   CALCIUM 9.6 10/07/2016 0925   PROT 6.6 10/07/2016 0925   ALBUMIN 4.0 10/07/2016 0925   AST 17 10/07/2016 0925   ALT 11 10/07/2016 0925   ALKPHOS 49 10/07/2016 0925   BILITOT 0.5 10/07/2016 0925   GFRNONAA 57 (L) 10/07/2016 0925   GFRAA 66 10/07/2016 0925   Lab Results  Component Value Date   CHOL 198 10/07/2016   HDL 73 10/07/2016   LDLCALC 85 10/07/2016   TRIG 198 (H) 10/07/2016   CHOLHDL 2.7  10/07/2016   Lab Results  Component Value Date   HGBA1C 4.9 10/07/2016   Lab Results  Component Value Date   VITAMINB12 750 10/07/2016   Lab Results  Component Value Date   TSH 2.40 10/07/2016      ASSESSMENT AND PLAN 72 y.o. year old female   Posturing tremor  No evidence of parkinsonian features, no family history, she is adopted, could be a underlying essential tremor, this can also be related to her anxiety, polypharmacy treatment,  She is taking Xanax as needed  Mild cognitive impairment  Overall stable, Mini-Mental Status Examination 30/30,  She is taking Namenda 10 mg twice a day, no significant side effect or improvement noted,  Her complaints of memory loss can be related to her depression, anxiety.  MRI of the brain 2016, mild generalized atrophy supratentorium small vessel disease, no treatable etiology found, she is adopted,   She is to continue moderate exercise  We have  talked about adding on Aricept 10 mg daily, she decided to hold it off,   Marcial Pacas, M.D. Ph.D.  [facility] [place], [place] [address] Phone: [phone] Fax:      [phone]

## 2017-06-13 ENCOUNTER — Encounter: Payer: Self-pay | Admitting: Women's Health

## 2017-06-13 ENCOUNTER — Ambulatory Visit: Payer: PPO | Admitting: Women's Health

## 2017-06-13 VITALS — BP 126/78 | Ht 62.5 in | Wt 122.0 lb

## 2017-06-13 DIAGNOSIS — B009 Herpesviral infection, unspecified: Secondary | ICD-10-CM

## 2017-06-13 DIAGNOSIS — Z01419 Encounter for gynecological examination (general) (routine) without abnormal findings: Secondary | ICD-10-CM

## 2017-06-13 DIAGNOSIS — Z79890 Hormone replacement therapy (postmenopausal): Secondary | ICD-10-CM

## 2017-06-13 MED ORDER — VALACYCLOVIR HCL 500 MG PO TABS: ORAL_TABLET | ORAL | 12 refills | Status: DC

## 2017-06-13 MED ORDER — ESTRADIOL 0.5 MG PO TABS: 1.0000 mg | ORAL_TABLET | Freq: Every day | ORAL | 4 refills | Status: DC

## 2017-06-13 NOTE — Progress Notes (Signed)
Megan Williams November 22, 1945 [id number]    History:    Presents for breast and pelvic exam with increased hot flashes and requests a higher dose of estradiol.  Currently on 0.5 mg states had been on 2 mg in the past.  1979 TVH for abnormal Pap with normal Paps after.  Normal mammogram history.  Primary care manages osteopenia.  Not sexually active/husband's health.  Smoking less than 4 cigarettes daily.  Numerous medical problems including hypertension, hypothyroidism, COPD.  Mild cognitive changes is on Namenda.   2018- colon biopsy.  Vaccines current. HSV 1.  Past medical history, past surgical history, family history and social history were all reviewed and documented in the EPIC chart.  Retired.  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 2 children.  ROS:  A ROS was performed and pertinent positives and negatives are included.  Exam:  Vitals:   06/13/17 1031  BP: 126/78  Weight: 122 lb (55.3 kg)  Height: 5' 2.5" (1.588 m)   Body mass index is 21.96 kg/m.   General appearance:  Normal Thyroid:  Symmetrical, normal in size, without palpable masses or nodularity. Respiratory  Auscultation:  Clear without wheezing or rhonchi Cardiovascular  Auscultation:  Regular rate, without rubs, murmurs or gallops  Edema/varicosities:  Not grossly evident Abdominal  Soft,nontender, without masses, guarding or rebound.  Liver/spleen:  No organomegaly noted  Hernia:  None appreciated  Skin  Inspection:  Grossly normal   Breasts: Examined lying and sitting.     Right: Without masses, retractions, discharge or axillary adenopathy.     Left: Without masses, retractions, discharge or axillary adenopathy. Gentitourinary   Inguinal/mons:  Normal without inguinal adenopathy  External genitalia:  Normal  BUS/Urethra/Skene's glands:  Normal  Vagina:  Normal  Cervix: And uterus absent  Adnexa/parametria:     Rt: Without masses or tenderness.   Lt: Without masses or tenderness.  Anus  and perineum: Normal  Digital rectal exam: Normal sphincter tone without palpated masses or tenderness  Assessment/Plan:  72 y.o. [name] G2 P2 for breast and pelvic exam with complaint of increased hot flashes.  1979 TVH on estradiol Hypertension, hypothyroidism, COPD, primary care manages Mild cognitive changes on Namenda Osteopenia  primary care managing with Prolia  Smoker less than 4 cigarettes daily HSV 1  Plan: HRT reviewed risks of blood clots, strokes and breast cancer especially with hypertension history.  States accepts risks states unable to sleep due to numerous hot flashes and having to change clothes nightly would like increased dose.  Estradiol 1 mg daily prescription, proper use given and reviewed will call if no relief of symptoms.  Valtrex 500 twice daily as needed for 3-5 days prescription, proper use given and reviewed.  SBE's, continue annual mammogram, exercise, calcium rich diet, vitamin D 2000 daily encouraged.  Home safety, fall prevention and importance of balance type exercise such as yoga encouraged.  Aware of hazards of smoking encouraged to continue to decrease/stop.     [place], 11:12 AM 06/13/2017

## 2017-06-13 NOTE — Patient Instructions (Addendum)
shingrex vaccine 2 shot vaccine    Health Maintenance for Postmenopausal Women Menopause is a normal process in which your reproductive ability comes to an end. This process happens gradually over a span of months to years, usually between the ages of 27 and 46. Menopause is complete when you have missed 12 consecutive menstrual periods. It is important to talk with your health care provider about some of the most common conditions that affect postmenopausal women, such as heart disease, cancer, and bone loss (osteoporosis). Adopting a healthy lifestyle and getting preventive care can help to promote your health and wellness. Those actions can also lower your chances of developing some of these common conditions. What should I know about menopause? During menopause, you may experience a number of symptoms, such as:  Moderate-to-severe hot flashes.  Night sweats.  Decrease in sex drive.  Mood swings.  Headaches.  Tiredness.  Irritability.  Memory problems.  Insomnia.  Choosing to treat or not to treat menopausal changes is an individual decision that you make with your health care provider. What should I know about hormone replacement therapy and supplements? Hormone therapy products are effective for treating symptoms that are associated with menopause, such as hot flashes and night sweats. Hormone replacement carries certain risks, especially as you become older. If you are thinking about using estrogen or estrogen with progestin treatments, discuss the benefits and risks with your health care provider. What should I know about heart disease and stroke? Heart disease, heart attack, and stroke become more likely as you age. This may be due, in part, to the hormonal changes that your body experiences during menopause. These can affect how your body processes dietary fats, triglycerides, and cholesterol. Heart attack and stroke are both medical emergencies. There are many things that you  can do to help prevent heart disease and stroke:  Have your blood pressure checked at least every 1-2 years. High blood pressure causes heart disease and increases the risk of stroke.  If you are 100-35 years old, ask your health care provider if you should take aspirin to prevent a heart attack or a stroke.  Do not use any tobacco products, including cigarettes, chewing tobacco, or electronic cigarettes. If you need help quitting, ask your health care provider.  It is important to eat a healthy diet and maintain a healthy weight. ? Be sure to include plenty of vegetables, fruits, low-fat dairy products, and lean protein. ? Avoid eating foods that are high in solid fats, added sugars, or salt (sodium).  Get regular exercise. This is one of the most important things that you can do for your health. ? Try to exercise for at least 150 minutes each week. The type of exercise that you do should increase your heart rate and make you sweat. This is known as moderate-intensity exercise. ? Try to do strengthening exercises at least twice each week. Do these in addition to the moderate-intensity exercise.  Know your numbers.Ask your health care provider to check your cholesterol and your blood glucose. Continue to have your blood tested as directed by your health care provider.  What should I know about cancer screening? There are several types of cancer. Take the following steps to reduce your risk and to catch any cancer development as early as possible. Breast Cancer  Practice breast self-awareness. ? This means understanding how your breasts normally appear and feel. ? It also means doing regular breast self-exams. Let your health care provider know about any changes, no  matter how small.  If you are 87 or older, have a clinician do a breast exam (clinical breast exam or CBE) every year. Depending on your age, family history, and medical history, it may be recommended that you also have a yearly  breast X-ray (mammogram).  If you have a family history of breast cancer, talk with your health care provider about genetic screening.  If you are at high risk for breast cancer, talk with your health care provider about having an MRI and a mammogram every year.  Breast cancer (BRCA) gene test is recommended for women who have family members with BRCA-related cancers. Results of the assessment will determine the need for genetic counseling and BRCA1 and for BRCA2 testing. BRCA-related cancers include these types: ? Breast. This occurs in males or females. ? Ovarian. ? Tubal. This may also be called fallopian tube cancer. ? Cancer of the abdominal or pelvic lining (peritoneal cancer). ? Prostate. ? Pancreatic.  Cervical, Uterine, and Ovarian Cancer Your health care provider may recommend that you be screened regularly for cancer of the pelvic organs. These include your ovaries, uterus, and vagina. This screening involves a pelvic exam, which includes checking for microscopic changes to the surface of your cervix (Pap test).  For women ages 21-65, health care providers may recommend a pelvic exam and a Pap test every three years. For women ages 51-65, they may recommend the Pap test and pelvic exam, combined with testing for human papilloma virus (HPV), every five years. Some types of HPV increase your risk of cervical cancer. Testing for HPV may also be done on women of any age who have unclear Pap test results.  Other health care providers may not recommend any screening for nonpregnant women who are considered low risk for pelvic cancer and have no symptoms. Ask your health care provider if a screening pelvic exam is right for you.  If you have had past treatment for cervical cancer or a condition that could lead to cancer, you need Pap tests and screening for cancer for at least 20 years after your treatment. If Pap tests have been discontinued for you, your risk factors (such as having a new  sexual partner) need to be reassessed to determine if you should start having screenings again. Some women have medical problems that increase the chance of getting cervical cancer. In these cases, your health care provider may recommend that you have screening and Pap tests more often.  If you have a family history of uterine cancer or ovarian cancer, talk with your health care provider about genetic screening.  If you have vaginal bleeding after reaching menopause, tell your health care provider.  There are currently no reliable tests available to screen for ovarian cancer.  Lung Cancer Lung cancer screening is recommended for adults 106-64 years old who are at high risk for lung cancer because of a history of smoking. A yearly low-dose CT scan of the lungs is recommended if you:  Currently smoke.  Have a history of at least 30 pack-years of smoking and you currently smoke or have quit within the past 15 years. A pack-year is smoking an average of one pack of cigarettes per day for one year.  Yearly screening should:  Continue until it has been 15 years since you quit.  Stop if you develop a health problem that would prevent you from having lung cancer treatment.  Colorectal Cancer  This type of cancer can be detected and can often be prevented.  Routine colorectal cancer screening usually begins at age 15 and continues through age 53.  If you have risk factors for colon cancer, your health care provider may recommend that you be screened at an earlier age.  If you have a family history of colorectal cancer, talk with your health care provider about genetic screening.  Your health care provider may also recommend using home test kits to check for hidden blood in your stool.  A small camera at the end of a tube can be used to examine your colon directly (sigmoidoscopy or colonoscopy). This is done to check for the earliest forms of colorectal cancer.  Direct examination of the  colon should be repeated every 5-10 years until age 77. However, if early forms of precancerous polyps or small growths are found or if you have a family history or genetic risk for colorectal cancer, you may need to be screened more often.  Skin Cancer  Check your skin from head to toe regularly.  Monitor any moles. Be sure to tell your health care provider: ? About any new moles or changes in moles, especially if there is a change in a mole's shape or color. ? If you have a mole that is larger than the size of a pencil eraser.  If any of your family members has a history of skin cancer, especially at a young age, talk with your health care provider about genetic screening.  Always use sunscreen. Apply sunscreen liberally and repeatedly throughout the day.  Whenever you are outside, protect yourself by wearing long sleeves, pants, a wide-brimmed hat, and sunglasses.  What should I know about osteoporosis? Osteoporosis is a condition in which bone destruction happens more quickly than new bone creation. After menopause, you may be at an increased risk for osteoporosis. To help prevent osteoporosis or the bone fractures that can happen because of osteoporosis, the following is recommended:  If you are 97-61 years old, get at least 1,000 mg of calcium and at least 600 mg of vitamin D per day.  If you are older than age 89 but younger than age 71, get at least 1,200 mg of calcium and at least 600 mg of vitamin D per day.  If you are older than age 7, get at least 1,200 mg of calcium and at least 800 mg of vitamin D per day.  Smoking and excessive alcohol intake increase the risk of osteoporosis. Eat foods that are rich in calcium and vitamin D, and do weight-bearing exercises several times each week as directed by your health care provider. What should I know about how menopause affects my mental health? Depression may occur at any age, but it is more common as you become older. Common  symptoms of depression include:  Low or sad mood.  Changes in sleep patterns.  Changes in appetite or eating patterns.  Feeling an overall lack of motivation or enjoyment of activities that you previously enjoyed.  Frequent crying spells.  Talk with your health care provider if you think that you are experiencing depression. What should I know about immunizations? It is important that you get and maintain your immunizations. These include:  Tetanus, diphtheria, and pertussis (Tdap) booster vaccine.  Influenza every year before the flu season begins.  Pneumonia vaccine.  Shingles vaccine.  Your health care provider may also recommend other immunizations. This information is not intended to replace advice given to you by your health care provider. Make sure you discuss any questions you have with  your health care provider. Document Released: 04/09/2005 Document Revised: 09/05/2015 Document Reviewed: 11/19/2014 Elsevier Interactive Patient Education  2018 [employer].

## 2017-06-16 DIAGNOSIS — R5383 Other fatigue: Secondary | ICD-10-CM | POA: Diagnosis not present

## 2017-06-16 DIAGNOSIS — E559 Vitamin D deficiency, unspecified: Secondary | ICD-10-CM | POA: Diagnosis not present

## 2017-06-16 DIAGNOSIS — M810 Age-related osteoporosis without current pathological fracture: Secondary | ICD-10-CM | POA: Diagnosis not present

## 2017-06-20 DIAGNOSIS — H524 Presbyopia: Secondary | ICD-10-CM | POA: Diagnosis not present

## 2017-06-20 DIAGNOSIS — H52203 Unspecified astigmatism, bilateral: Secondary | ICD-10-CM | POA: Diagnosis not present

## 2017-06-20 DIAGNOSIS — H5203 Hypermetropia, bilateral: Secondary | ICD-10-CM | POA: Diagnosis not present

## 2017-06-20 DIAGNOSIS — H16223 Keratoconjunctivitis sicca, not specified as Sjogren's, bilateral: Secondary | ICD-10-CM | POA: Diagnosis not present

## 2017-06-21 ENCOUNTER — Telehealth: Payer: Self-pay | Admitting: Internal Medicine

## 2017-06-21 DIAGNOSIS — Z716 Tobacco abuse counseling: Secondary | ICD-10-CM

## 2017-06-21 MED ORDER — VARENICLINE TARTRATE 0.5 MG PO TABS: 0.5000 mg | ORAL_TABLET | Freq: Two times a day (BID) | ORAL | 0 refills | Status: DC

## 2017-06-21 NOTE — Telephone Encounter (Signed)
Please refill this

## 2017-06-21 NOTE — Telephone Encounter (Signed)
Done

## 2017-06-21 NOTE — Telephone Encounter (Signed)
Chen Holzman Self [phone]  [facility]  Chantix - smoking   Vonna called in and would like for you to send in a prescription for Chantix, she stated every time she tries to quit something else happens.

## 2017-06-28 DIAGNOSIS — M810 Age-related osteoporosis without current pathological fracture: Secondary | ICD-10-CM | POA: Diagnosis not present

## 2017-06-28 DIAGNOSIS — M25552 Pain in left hip: Secondary | ICD-10-CM | POA: Diagnosis not present

## 2017-07-19 ENCOUNTER — Other Ambulatory Visit: Payer: Self-pay | Admitting: Internal Medicine

## 2017-07-19 DIAGNOSIS — Z716 Tobacco abuse counseling: Secondary | ICD-10-CM

## 2017-07-22 ENCOUNTER — Other Ambulatory Visit: Payer: Self-pay | Admitting: Women's Health

## 2017-07-22 DIAGNOSIS — Z79890 Hormone replacement therapy (postmenopausal): Secondary | ICD-10-CM

## 2017-07-22 MED ORDER — ESTRADIOL 0.5 MG PO TABS: 1.0000 mg | ORAL_TABLET | Freq: Every day | ORAL | 4 refills | Status: DC

## 2017-07-22 NOTE — Telephone Encounter (Signed)
Spoke with patient correct Rx sent, per note on 06/13/17 estradiol 0.5 mg tablet take 2 tablets by mouth daily.

## 2017-07-22 NOTE — Telephone Encounter (Signed)
Left message for pt to call.

## 2017-07-29 ENCOUNTER — Other Ambulatory Visit: Payer: Self-pay | Admitting: Internal Medicine

## 2017-08-04 DIAGNOSIS — M47816 Spondylosis without myelopathy or radiculopathy, lumbar region: Secondary | ICD-10-CM | POA: Diagnosis not present

## 2017-08-04 DIAGNOSIS — M5416 Radiculopathy, lumbar region: Secondary | ICD-10-CM | POA: Diagnosis not present

## 2017-08-05 DIAGNOSIS — M47816 Spondylosis without myelopathy or radiculopathy, lumbar region: Secondary | ICD-10-CM | POA: Diagnosis not present

## 2017-08-10 DIAGNOSIS — M48061 Spinal stenosis, lumbar region without neurogenic claudication: Secondary | ICD-10-CM | POA: Diagnosis not present

## 2017-08-10 DIAGNOSIS — M5126 Other intervertebral disc displacement, lumbar region: Secondary | ICD-10-CM | POA: Diagnosis not present

## 2017-08-10 DIAGNOSIS — M5416 Radiculopathy, lumbar region: Secondary | ICD-10-CM | POA: Diagnosis not present

## 2017-08-25 DIAGNOSIS — N3281 Overactive bladder: Secondary | ICD-10-CM | POA: Diagnosis not present

## 2017-08-25 DIAGNOSIS — R8290 Unspecified abnormal findings in urine: Secondary | ICD-10-CM | POA: Diagnosis not present

## 2017-08-25 DIAGNOSIS — R351 Nocturia: Secondary | ICD-10-CM | POA: Diagnosis not present

## 2017-08-30 DIAGNOSIS — M5416 Radiculopathy, lumbar region: Secondary | ICD-10-CM | POA: Diagnosis not present

## 2017-08-30 DIAGNOSIS — M6281 Muscle weakness (generalized): Secondary | ICD-10-CM | POA: Diagnosis not present

## 2017-08-30 DIAGNOSIS — R2689 Other abnormalities of gait and mobility: Secondary | ICD-10-CM | POA: Diagnosis not present

## 2017-08-30 DIAGNOSIS — M545 Low back pain: Secondary | ICD-10-CM | POA: Diagnosis not present

## 2017-09-05 DIAGNOSIS — M5416 Radiculopathy, lumbar region: Secondary | ICD-10-CM | POA: Diagnosis not present

## 2017-09-05 DIAGNOSIS — R2689 Other abnormalities of gait and mobility: Secondary | ICD-10-CM | POA: Diagnosis not present

## 2017-09-05 DIAGNOSIS — M6281 Muscle weakness (generalized): Secondary | ICD-10-CM | POA: Diagnosis not present

## 2017-09-05 DIAGNOSIS — M545 Low back pain: Secondary | ICD-10-CM | POA: Diagnosis not present

## 2017-09-06 DIAGNOSIS — Z981 Arthrodesis status: Secondary | ICD-10-CM | POA: Diagnosis not present

## 2017-09-06 DIAGNOSIS — M5416 Radiculopathy, lumbar region: Secondary | ICD-10-CM | POA: Diagnosis not present

## 2017-09-07 DIAGNOSIS — M545 Low back pain: Secondary | ICD-10-CM | POA: Diagnosis not present

## 2017-09-07 DIAGNOSIS — M5416 Radiculopathy, lumbar region: Secondary | ICD-10-CM | POA: Diagnosis not present

## 2017-09-07 DIAGNOSIS — M6281 Muscle weakness (generalized): Secondary | ICD-10-CM | POA: Diagnosis not present

## 2017-09-07 DIAGNOSIS — R2689 Other abnormalities of gait and mobility: Secondary | ICD-10-CM | POA: Diagnosis not present

## 2017-09-12 DIAGNOSIS — M5416 Radiculopathy, lumbar region: Secondary | ICD-10-CM | POA: Diagnosis not present

## 2017-09-12 DIAGNOSIS — R2689 Other abnormalities of gait and mobility: Secondary | ICD-10-CM | POA: Diagnosis not present

## 2017-09-12 DIAGNOSIS — M6281 Muscle weakness (generalized): Secondary | ICD-10-CM | POA: Diagnosis not present

## 2017-09-12 DIAGNOSIS — M545 Low back pain: Secondary | ICD-10-CM | POA: Diagnosis not present

## 2017-09-14 DIAGNOSIS — M5416 Radiculopathy, lumbar region: Secondary | ICD-10-CM | POA: Diagnosis not present

## 2017-09-14 DIAGNOSIS — M6281 Muscle weakness (generalized): Secondary | ICD-10-CM | POA: Diagnosis not present

## 2017-09-14 DIAGNOSIS — M545 Low back pain: Secondary | ICD-10-CM | POA: Diagnosis not present

## 2017-09-14 DIAGNOSIS — R2689 Other abnormalities of gait and mobility: Secondary | ICD-10-CM | POA: Diagnosis not present

## 2017-09-19 ENCOUNTER — Other Ambulatory Visit: Payer: Self-pay | Admitting: Internal Medicine

## 2017-09-19 DIAGNOSIS — Z716 Tobacco abuse counseling: Secondary | ICD-10-CM

## 2017-09-19 DIAGNOSIS — R2689 Other abnormalities of gait and mobility: Secondary | ICD-10-CM | POA: Diagnosis not present

## 2017-09-19 DIAGNOSIS — M545 Low back pain: Secondary | ICD-10-CM | POA: Diagnosis not present

## 2017-09-19 DIAGNOSIS — M6281 Muscle weakness (generalized): Secondary | ICD-10-CM | POA: Diagnosis not present

## 2017-09-19 DIAGNOSIS — M5416 Radiculopathy, lumbar region: Secondary | ICD-10-CM | POA: Diagnosis not present

## 2017-09-21 DIAGNOSIS — M545 Low back pain: Secondary | ICD-10-CM | POA: Diagnosis not present

## 2017-09-21 DIAGNOSIS — M6281 Muscle weakness (generalized): Secondary | ICD-10-CM | POA: Diagnosis not present

## 2017-09-21 DIAGNOSIS — M5416 Radiculopathy, lumbar region: Secondary | ICD-10-CM | POA: Diagnosis not present

## 2017-09-21 DIAGNOSIS — R2689 Other abnormalities of gait and mobility: Secondary | ICD-10-CM | POA: Diagnosis not present

## 2017-09-26 DIAGNOSIS — M6281 Muscle weakness (generalized): Secondary | ICD-10-CM | POA: Diagnosis not present

## 2017-09-26 DIAGNOSIS — R2689 Other abnormalities of gait and mobility: Secondary | ICD-10-CM | POA: Diagnosis not present

## 2017-09-26 DIAGNOSIS — M5416 Radiculopathy, lumbar region: Secondary | ICD-10-CM | POA: Diagnosis not present

## 2017-09-26 DIAGNOSIS — M545 Low back pain: Secondary | ICD-10-CM | POA: Diagnosis not present

## 2017-09-28 DIAGNOSIS — M545 Low back pain: Secondary | ICD-10-CM | POA: Diagnosis not present

## 2017-09-28 DIAGNOSIS — M5416 Radiculopathy, lumbar region: Secondary | ICD-10-CM | POA: Diagnosis not present

## 2017-09-28 DIAGNOSIS — R2689 Other abnormalities of gait and mobility: Secondary | ICD-10-CM | POA: Diagnosis not present

## 2017-09-28 DIAGNOSIS — M6281 Muscle weakness (generalized): Secondary | ICD-10-CM | POA: Diagnosis not present

## 2017-10-03 DIAGNOSIS — M6281 Muscle weakness (generalized): Secondary | ICD-10-CM | POA: Diagnosis not present

## 2017-10-03 DIAGNOSIS — M545 Low back pain: Secondary | ICD-10-CM | POA: Diagnosis not present

## 2017-10-03 DIAGNOSIS — M5416 Radiculopathy, lumbar region: Secondary | ICD-10-CM | POA: Diagnosis not present

## 2017-10-03 DIAGNOSIS — R2689 Other abnormalities of gait and mobility: Secondary | ICD-10-CM | POA: Diagnosis not present

## 2017-10-05 ENCOUNTER — Other Ambulatory Visit: Payer: Self-pay | Admitting: Internal Medicine

## 2017-10-05 DIAGNOSIS — Z0000 Encounter for general adult medical examination without abnormal findings: Secondary | ICD-10-CM

## 2017-10-05 DIAGNOSIS — G250 Essential tremor: Secondary | ICD-10-CM

## 2017-10-05 DIAGNOSIS — M6281 Muscle weakness (generalized): Secondary | ICD-10-CM | POA: Diagnosis not present

## 2017-10-05 DIAGNOSIS — F419 Anxiety disorder, unspecified: Secondary | ICD-10-CM

## 2017-10-05 DIAGNOSIS — Z87891 Personal history of nicotine dependence: Secondary | ICD-10-CM

## 2017-10-05 DIAGNOSIS — F32A Depression, unspecified: Secondary | ICD-10-CM

## 2017-10-05 DIAGNOSIS — E039 Hypothyroidism, unspecified: Secondary | ICD-10-CM

## 2017-10-05 DIAGNOSIS — M8580 Other specified disorders of bone density and structure, unspecified site: Secondary | ICD-10-CM

## 2017-10-05 DIAGNOSIS — F329 Major depressive disorder, single episode, unspecified: Secondary | ICD-10-CM

## 2017-10-05 DIAGNOSIS — R2689 Other abnormalities of gait and mobility: Secondary | ICD-10-CM | POA: Diagnosis not present

## 2017-10-05 DIAGNOSIS — E7849 Other hyperlipidemia: Secondary | ICD-10-CM

## 2017-10-05 DIAGNOSIS — R413 Other amnesia: Secondary | ICD-10-CM

## 2017-10-05 DIAGNOSIS — M545 Low back pain: Secondary | ICD-10-CM | POA: Diagnosis not present

## 2017-10-05 DIAGNOSIS — M5416 Radiculopathy, lumbar region: Secondary | ICD-10-CM | POA: Diagnosis not present

## 2017-10-10 ENCOUNTER — Other Ambulatory Visit: Payer: PPO | Admitting: Internal Medicine

## 2017-10-10 DIAGNOSIS — E039 Hypothyroidism, unspecified: Secondary | ICD-10-CM

## 2017-10-10 DIAGNOSIS — E7849 Other hyperlipidemia: Secondary | ICD-10-CM

## 2017-10-10 DIAGNOSIS — R413 Other amnesia: Secondary | ICD-10-CM | POA: Diagnosis not present

## 2017-10-10 DIAGNOSIS — M545 Low back pain: Secondary | ICD-10-CM | POA: Diagnosis not present

## 2017-10-10 DIAGNOSIS — G250 Essential tremor: Secondary | ICD-10-CM

## 2017-10-10 DIAGNOSIS — R2689 Other abnormalities of gait and mobility: Secondary | ICD-10-CM | POA: Diagnosis not present

## 2017-10-10 DIAGNOSIS — M5416 Radiculopathy, lumbar region: Secondary | ICD-10-CM | POA: Diagnosis not present

## 2017-10-10 DIAGNOSIS — Z0000 Encounter for general adult medical examination without abnormal findings: Secondary | ICD-10-CM

## 2017-10-10 DIAGNOSIS — F329 Major depressive disorder, single episode, unspecified: Secondary | ICD-10-CM

## 2017-10-10 DIAGNOSIS — M810 Age-related osteoporosis without current pathological fracture: Secondary | ICD-10-CM

## 2017-10-10 DIAGNOSIS — M6281 Muscle weakness (generalized): Secondary | ICD-10-CM | POA: Diagnosis not present

## 2017-10-10 DIAGNOSIS — E559 Vitamin D deficiency, unspecified: Secondary | ICD-10-CM

## 2017-10-10 DIAGNOSIS — F419 Anxiety disorder, unspecified: Secondary | ICD-10-CM

## 2017-10-10 DIAGNOSIS — F32A Depression, unspecified: Secondary | ICD-10-CM

## 2017-10-10 DIAGNOSIS — R5383 Other fatigue: Secondary | ICD-10-CM

## 2017-10-10 DIAGNOSIS — Z87891 Personal history of nicotine dependence: Secondary | ICD-10-CM

## 2017-10-10 DIAGNOSIS — M8580 Other specified disorders of bone density and structure, unspecified site: Secondary | ICD-10-CM

## 2017-10-11 LAB — CBC WITH DIFFERENTIAL/PLATELET
Basophils Absolute: 30 cells/uL (ref 0–200)
Basophils Relative: 0.4 %
Eosinophils Absolute: 30 cells/uL (ref 15–500)
Eosinophils Relative: 0.4 %
HCT: 41.8 % (ref 35.0–45.0)
Hemoglobin: 14.4 g/dL (ref 11.7–15.5)
Lymphs Abs: 2531 cells/uL (ref 850–3900)
MCH: 35 pg — ABNORMAL HIGH (ref 27.0–33.0)
MCHC: 34.4 g/dL (ref 32.0–36.0)
MCV: 101.7 fL — ABNORMAL HIGH (ref 80.0–100.0)
MPV: 8.9 fL (ref 7.5–12.5)
Monocytes Relative: 6.6 %
Neutro Abs: 4507 cells/uL (ref 1500–7800)
Neutrophils Relative %: 59.3 %
Platelets: 275 10*3/uL (ref 140–400)
RBC: 4.11 10*6/uL (ref 3.80–5.10)
RDW: 13.7 % (ref 11.0–15.0)
Total Lymphocyte: 33.3 %
WBC mixed population: 502 cells/uL (ref 200–950)
WBC: 7.6 10*3/uL (ref 3.8–10.8)

## 2017-10-11 LAB — LIPID PANEL
Cholesterol: 228 mg/dL — ABNORMAL HIGH (ref <200)
HDL: 89 mg/dL (ref >50)
LDL Cholesterol (Calc): 111 mg/dL (calc) — ABNORMAL HIGH
Non-HDL Cholesterol (Calc): 139 mg/dL (calc) — ABNORMAL HIGH (ref <130)
Total CHOL/HDL Ratio: 2.6 (calc) (ref <5.0)
Triglycerides: 157 mg/dL — ABNORMAL HIGH (ref <150)

## 2017-10-11 LAB — COMPLETE METABOLIC PANEL WITH GFR
AG Ratio: 1.6 (calc) (ref 1.0–2.5)
ALT: 8 U/L (ref 6–29)
AST: 13 U/L (ref 10–35)
Albumin: 4.3 g/dL (ref 3.6–5.1)
Alkaline phosphatase (APISO): 47 U/L (ref 33–130)
BUN/Creatinine Ratio: 15 (calc) (ref 6–22)
BUN: 16 mg/dL (ref 7–25)
CO2: 29 mmol/L (ref 20–32)
Calcium: 9.7 mg/dL (ref 8.6–10.4)
Chloride: 102 mmol/L (ref 98–110)
Creat: 1.08 mg/dL — ABNORMAL HIGH (ref 0.60–0.93)
GFR, Est African American: 60 mL/min/{1.73_m2} (ref >=60)
GFR, Est Non African American: 52 mL/min/{1.73_m2} — ABNORMAL LOW (ref >=60)
Globulin: 2.7 g/dL (calc) (ref 1.9–3.7)
Glucose, Bld: 106 mg/dL — ABNORMAL HIGH (ref 65–99)
Potassium: 4.2 mmol/L (ref 3.5–5.3)
Sodium: 139 mmol/L (ref 135–146)
Total Bilirubin: 0.6 mg/dL (ref 0.2–1.2)
Total Protein: 7 g/dL (ref 6.1–8.1)

## 2017-10-11 LAB — VITAMIN D 25 HYDROXY (VIT D DEFICIENCY, FRACTURES): Vit D, 25-Hydroxy: 68 ng/mL (ref 30–100)

## 2017-10-11 LAB — TSH: TSH: 7.01 mIU/L — ABNORMAL HIGH (ref 0.40–4.50)

## 2017-10-12 DIAGNOSIS — M5126 Other intervertebral disc displacement, lumbar region: Secondary | ICD-10-CM | POA: Diagnosis not present

## 2017-10-12 DIAGNOSIS — R2689 Other abnormalities of gait and mobility: Secondary | ICD-10-CM | POA: Diagnosis not present

## 2017-10-12 DIAGNOSIS — M6281 Muscle weakness (generalized): Secondary | ICD-10-CM | POA: Diagnosis not present

## 2017-10-12 DIAGNOSIS — M545 Low back pain: Secondary | ICD-10-CM | POA: Diagnosis not present

## 2017-10-12 DIAGNOSIS — M5416 Radiculopathy, lumbar region: Secondary | ICD-10-CM | POA: Diagnosis not present

## 2017-10-14 ENCOUNTER — Encounter: Payer: Self-pay | Admitting: Internal Medicine

## 2017-10-14 ENCOUNTER — Ambulatory Visit (INDEPENDENT_AMBULATORY_CARE_PROVIDER_SITE_OTHER): Payer: PPO | Admitting: Internal Medicine

## 2017-10-14 VITALS — BP 108/80 | HR 77 | Ht 63.0 in | Wt 126.0 lb

## 2017-10-14 DIAGNOSIS — M5416 Radiculopathy, lumbar region: Secondary | ICD-10-CM | POA: Diagnosis not present

## 2017-10-14 DIAGNOSIS — R413 Other amnesia: Secondary | ICD-10-CM | POA: Diagnosis not present

## 2017-10-14 DIAGNOSIS — R159 Full incontinence of feces: Secondary | ICD-10-CM | POA: Diagnosis not present

## 2017-10-14 DIAGNOSIS — E782 Mixed hyperlipidemia: Secondary | ICD-10-CM | POA: Diagnosis not present

## 2017-10-14 DIAGNOSIS — E039 Hypothyroidism, unspecified: Secondary | ICD-10-CM | POA: Diagnosis not present

## 2017-10-14 DIAGNOSIS — F419 Anxiety disorder, unspecified: Secondary | ICD-10-CM

## 2017-10-14 DIAGNOSIS — E890 Postprocedural hypothyroidism: Secondary | ICD-10-CM

## 2017-10-14 DIAGNOSIS — F329 Major depressive disorder, single episode, unspecified: Secondary | ICD-10-CM

## 2017-10-14 DIAGNOSIS — F32A Depression, unspecified: Secondary | ICD-10-CM

## 2017-10-14 DIAGNOSIS — K219 Gastro-esophageal reflux disease without esophagitis: Secondary | ICD-10-CM

## 2017-10-14 DIAGNOSIS — Z9889 Other specified postprocedural states: Secondary | ICD-10-CM | POA: Diagnosis not present

## 2017-10-14 DIAGNOSIS — M8580 Other specified disorders of bone density and structure, unspecified site: Secondary | ICD-10-CM

## 2017-10-14 DIAGNOSIS — G250 Essential tremor: Secondary | ICD-10-CM

## 2017-10-14 DIAGNOSIS — Z0000 Encounter for general adult medical examination without abnormal findings: Secondary | ICD-10-CM

## 2017-10-14 LAB — POCT URINALYSIS DIPSTICK
Appearance: NORMAL
Bilirubin, UA: NEGATIVE
Blood, UA: NEGATIVE
Glucose, UA: NEGATIVE
Ketones, UA: NEGATIVE
Leukocytes, UA: NEGATIVE
Nitrite, UA: NEGATIVE
Odor: NORMAL
Protein, UA: NEGATIVE
Spec Grav, UA: 1.015 (ref 1.010–1.025)
Urobilinogen, UA: 0.2 E.U./dL
pH, UA: 6 (ref 5.0–8.0)

## 2017-10-14 MED ORDER — LEVOTHYROXINE SODIUM 100 MCG PO TABS: 100.0000 ug | ORAL_TABLET | Freq: Every day | ORAL | 3 refills | Status: DC

## 2017-10-14 NOTE — Progress Notes (Signed)
Subjective:    Patient ID: Megan Williams, female    DOB: Feb 16, 1946, 72 y.o.   MRN: [id number]  HPI 72 year old Female in today for health maintenance exam, Medicare wellness and evaluation of medical issues.  History of T12 compression fracture 2017 seen by Dr. Carlean Jews start after a fall in the shower.  Has been having issues with back pain and once again is seen Dr. Ellene Route.  In 2012 she had an L4-L5 decompression stabilization by Dr. Ellene Route.  She also has cervical spine 3 level fusion C4-C7 done by Dr. Ellene Route in 2005.  In August 2018 she fell onto her knees and then her buttocks.  Has had increasing pain and left buttock and was seen at [facility] 2019.  X-rays at that time showed no fracture.  She does have osteopenia and sees Dr. Layne Benton for Prolia injections.  In January at [facility], she was treated with Toradol and Norco. Joint reconstruction of right thumb 2008 by Dr. Daylene Katayama.  History of GE reflux  In 2018 she had colonoscopy by Dr. Watt Climes for evaluation of diarrhea which study proved to be normal  History of fecal incontinence related to constipation.  History of hypertriglyceridemia  History of COPD exacerbations on occasion with bronchitis.  History of allergic rhinitis.  She sees a Designer, jewellery for GYN care.  Is been trying to stop smoking with Chantix  Has been diagnosed with tremor by neurologist and mild cognitive impairment.  She seems forgetful.  She has a history of anxiety depression.  History of hypothyroidism treated with thyroid replacement.  In 2016 she had low B12 level in the low 200 range that improved with oral B12 supplementation.  Colonoscopy done by Dr. Collene Mares in 2005  Thyroidectomy 1968, Ray, hysterectomy without oophorectomy 1979, cholecystectomy 1993  Family history: Unknown since she is adopted  Social history: She is married.  2 adult children has a college degree and an Associate degree as a Radio broadcast assistant.  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       Review of Systems current issues with back pain being seen by Dr. Ellene Route.  Seems to have a right lumbar radiculopathy     Objective:   Physical Exam  Constitutional: She is oriented to person, place, and time. She appears well-developed and well-nourished. No distress.  HENT:  Head: Normocephalic and atraumatic.  Right Ear: External ear normal.  Left Ear: External ear normal.  Mouth/Throat: Oropharynx is clear and moist.  Eyes: Pupils are equal, round, and reactive to light. EOM are normal. Right eye exhibits no discharge. Left eye exhibits no discharge. No scleral icterus.  Neck: Neck supple. No thyromegaly present.  Cardiovascular: Normal rate, regular rhythm and normal heart sounds.  No murmur heard. Pulmonary/Chest: Breath sounds normal. No stridor. No respiratory distress. She has no wheezes. She has no rales.  Breasts normal Female  Abdominal: Soft. She exhibits no distension and no mass. There is no tenderness. There is no rebound and no guarding.  Genitourinary:  Genitourinary Comments: Deferred to GYN  Musculoskeletal: She exhibits no edema.  Neurological: She is alert and oriented to person, place, and time. She displays normal reflexes. No cranial nerve deficit. She exhibits normal muscle tone. Coordination normal.  Skin: Skin is warm and dry. No rash noted. She is not diaphoretic.  Psychiatric: She has a normal mood and affect. Her behavior is normal. Judgment and thought content  normal.  Vitals reviewed.         Assessment & Plan:  Osteopenia seen by Dr. Layne Benton.  Gets Prolia injections vitamin D level is normal.  Bone density study done 2018 with T score right femoral neck -1.7 and left forearm -0.2  Back and hip pain seen by Dr. Ellene Route.  Right lumbar radiculopathy  Cognitive deficits followed by neurologist and is on Namenda  Hypothyroidism-TSH is elevated at  7 and this will need to be repeated.  She has missed some doses.  Status post thyroidectomy  Hyperlipidemia: Total cholesterol 228 and a year ago was 198 triglycerides have improved from 1 98-1 57 LDL cholesterol is 111 and last year was 85  MCV elevated at 101.7.  B12 level last year was 750.  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  Will need B12 and folate at the time.  Subjective:   Patient presents for Medicare Annual/Subsequent preventive examination.  Review Past Medical/Family/Social:   Risk Factors  Current exercise habits: Sedentary now due to right lumbar radiculopathy Dietary issues discussed: Low-fat low carbohydrate  Cardiac risk factors: Hyperlipidemia and smoking  Depression Screen  (Note: if answer to either of the following is "Yes", a more complete depression screening is indicated)   Over the past two weeks, have you felt down, depressed or hopeless? No  Over the past two weeks, have you felt little interest or pleasure in doing things? No Have you lost interest or pleasure in daily life? No Do you often feel hopeless? No Do you cry easily over simple problems? No   Activities of Daily Living  In your present state of health, do you have any difficulty performing the following activities?:   Driving? No  Managing money? No  Feeding yourself? No  Getting from bed to chair? No  Climbing a flight of stairs? No  Preparing food and eating?: No  Bathing or showering? No  Getting dressed: No  Getting to the toilet? No  Using the toilet:No  Moving around from place to place: No  In the past year have you fallen or had a near fall?:  Last year Are you sexually active? No  Do you have more than one partner? No   Hearing Difficulties: No  Do you often ask people to speak up or  repeat themselves?  Sometimes Do you experience ringing or noises in your ears? No  Do you have difficulty understanding soft or whispered voices? No  Do you feel that you have a problem with memory? No Do you often misplace items? No    Home Safety:  Do you have a smoke alarm at your residence? Yes Do you have grab bars in the bathroom?  No Do you have throw rugs in your house?  No   Cognitive Testing  Alert? Yes Normal Appearance?Yes  Oriented to person? Yes Place? Yes  Time? Yes  Recall of three objects?  Not tested Can perform simple calculations?  Not tested Displays appropriate judgment?Yes  Can read the correct time from a watch face?Yes   List the Names of Other Physician/Practitioners you currently use:  See referral list for the physicians patient is currently seeing.  Dr. Ellene Route  Dr. Layne Benton  Dr. [name]  GYN   Review of Systems: See above   Objective:     General appearance: Appears  stated age and frail Head: Normocephalic, without obvious abnormality, atraumatic  Eyes: conj clear, EOMi PEERLA  Ears: normal TM's and external ear canals both ears  Nose: Nares normal. Septum midline. Mucosa normal. No drainage or sinus tenderness.  Throat: lips, mucosa, and tongue normal; teeth and gums normal  Neck: no adenopathy, no carotid bruit, no JVD, supple, symmetrical, trachea midline and thyroid not enlarged, symmetric, no tenderness/mass/nodules  No CVA tenderness.  Lungs: clear to auscultation bilaterally  Breasts: Normal female Heart: regular rate and rhythm, S1, S2 normal, no murmur, click, rub or gallop  Abdomen: soft, non-tender; bowel sounds normal; no masses, no organomegaly  Musculoskeletal: ROM normal in all joints, no crepitus, no deformity, Normal muscle strengthen. Back  is symmetric, no curvature. Skin: Skin color, texture, turgor normal. No rashes or lesions  Lymph nodes: Cervical, supraclavicular, and axillary nodes normal.  Neurologic: CN 2 -12  Normal, Normal symmetric reflexes. Normal coordination and gait  Psych: Alert & Oriented x 3, Mood appear stable.    Assessment:    Annual wellness medicare exam   Plan:    During the course of the visit the patient was educated and counseled about appropriate screening and preventive services including:   Annual mammogram  Annual flu vaccine     Patient Instructions (the written plan) was given to the patient.  Medicare Attestation  I have personally reviewed:  The patient's medical and social history  Their use of alcohol, tobacco or illicit drugs  Their current medications and supplements  The patient's functional ability including ADLs,fall risks, home safety risks, cognitive, and hearing and visual impairment  Diet and physical activities  Evidence for depression or mood disorders  The patient's weight, height, BMI, and visual acuity have been recorded in the chart. I have made referrals, counseling, and provided education to the patient based on review of the above and I have provided the patient with a written personalized care plan for preventive services.

## 2017-10-17 DIAGNOSIS — M6281 Muscle weakness (generalized): Secondary | ICD-10-CM | POA: Diagnosis not present

## 2017-10-17 DIAGNOSIS — R2689 Other abnormalities of gait and mobility: Secondary | ICD-10-CM | POA: Diagnosis not present

## 2017-10-17 DIAGNOSIS — M5416 Radiculopathy, lumbar region: Secondary | ICD-10-CM | POA: Diagnosis not present

## 2017-10-17 DIAGNOSIS — M545 Low back pain: Secondary | ICD-10-CM | POA: Diagnosis not present

## 2017-10-20 ENCOUNTER — Telehealth: Payer: Self-pay | Admitting: Internal Medicine

## 2017-10-20 NOTE — Telephone Encounter (Signed)
Spoke with patient and advised of message.  Provided Dr. Rhea Belton phone number to patient so she can call tomorrow and make appointment.  Patient verbalized understanding of our conversation.

## 2017-10-20 NOTE — Telephone Encounter (Signed)
She is having tremors really bad.  She wants to know if you think she needs to be seen by you or to make an appointment to see Dr. [name] for this.  She tried taking Xanax for this and the Xanax is not helping. So, she said she's stopping the Xanax.   I told her she cannot stop the Xanax suddenly.  Noemi Chapel prescribed this for her, and she made an appointment to see her, but she cannot see her until sometime in September.  I told her she'll have to taper off of the Xanax, that it's dangerous to just stop it.  So, she said, ok she'll take a 1/2 a pill, but it's not working for the tremors.  She said she knows what anxiety feels like and she's not anxious.  The tremors are driving her crazy.    Should she make an appointment with Dr. [name] or you?    Phone #:  [phone]  Thank you.

## 2017-10-20 NOTE — Telephone Encounter (Signed)
The Xanax is not causing the tremors. She should see Dr. [name]. Do not stop Xanax until seen by neurologist.

## 2017-10-25 ENCOUNTER — Other Ambulatory Visit: Payer: Self-pay | Admitting: Internal Medicine

## 2017-10-25 DIAGNOSIS — Z716 Tobacco abuse counseling: Secondary | ICD-10-CM

## 2017-10-29 ENCOUNTER — Encounter: Payer: Self-pay | Admitting: Internal Medicine

## 2017-10-29 NOTE — Patient Instructions (Signed)
Take thyroid medication regularly.  Return in September for TSH B12 and folate levels with office visit.  Watch diet.

## 2017-11-08 DIAGNOSIS — F429 Obsessive-compulsive disorder, unspecified: Secondary | ICD-10-CM | POA: Diagnosis not present

## 2017-11-08 DIAGNOSIS — F3341 Major depressive disorder, recurrent, in partial remission: Secondary | ICD-10-CM | POA: Diagnosis not present

## 2017-11-11 DIAGNOSIS — M5126 Other intervertebral disc displacement, lumbar region: Secondary | ICD-10-CM | POA: Diagnosis not present

## 2017-11-11 HISTORY — PX: LUMBAR DISC SURGERY: SHX700

## 2017-11-21 ENCOUNTER — Other Ambulatory Visit: Payer: PPO | Admitting: Internal Medicine

## 2017-11-21 DIAGNOSIS — E039 Hypothyroidism, unspecified: Secondary | ICD-10-CM | POA: Diagnosis not present

## 2017-11-21 LAB — TSH: TSH: 1.37 mIU/L (ref 0.40–4.50)

## 2017-11-24 ENCOUNTER — Ambulatory Visit (INDEPENDENT_AMBULATORY_CARE_PROVIDER_SITE_OTHER): Payer: PPO | Admitting: Internal Medicine

## 2017-11-24 ENCOUNTER — Encounter: Payer: Self-pay | Admitting: Internal Medicine

## 2017-11-24 VITALS — BP 110/60 | HR 86 | Temp 98.1°F | Ht 63.0 in | Wt 124.0 lb

## 2017-11-24 DIAGNOSIS — M5441 Lumbago with sciatica, right side: Secondary | ICD-10-CM

## 2017-11-24 DIAGNOSIS — E039 Hypothyroidism, unspecified: Secondary | ICD-10-CM | POA: Diagnosis not present

## 2017-11-24 NOTE — Progress Notes (Signed)
   Subjective:    Patient ID: Megan Williams, female    DOB: 06-06-1945, 72 y.o.   MRN: [id number]  HPI 72 year old Female for follow up of elevated TSH in August with history of hypothyroidism.  She admitted to missing some doses of medication.  We did not change her dose.  She is status post thyroidectomy.  Recently had a L3-L4 discectomy by Dr. Ellene Route.  Says she recently must have reinjured her back and is having some pain.  Apparently now was on a prednisone Dosepak per Dr. Ellene Route along with Valium.    Review of Systems     Objective:   Physical Exam Vital signs reviewed Neck supple Chest clear Cor RRR Ext without edema No thyromegaly      Assessment & Plan:  Hypothyroidism-continue current dose of thyroid replacement medication as TSH is now normal  Status post L3-L4 discectomy with back pain today.  Finish steroid Dosepak prescribed by Dr. Ellene Route and may take Valium at bedtime as muscle relaxant.  Memory loss-continue to follow-up

## 2017-11-24 NOTE — Patient Instructions (Addendum)
Finish prednisone prescribed by Dr. Ellene Route. TSH is normal on current dose of thyroid replacement. RTC February for 6 month recheck.

## 2017-12-06 DIAGNOSIS — M5126 Other intervertebral disc displacement, lumbar region: Secondary | ICD-10-CM | POA: Diagnosis not present

## 2017-12-07 DIAGNOSIS — M5116 Intervertebral disc disorders with radiculopathy, lumbar region: Secondary | ICD-10-CM | POA: Diagnosis not present

## 2017-12-08 DIAGNOSIS — M5126 Other intervertebral disc displacement, lumbar region: Secondary | ICD-10-CM | POA: Diagnosis not present

## 2017-12-15 ENCOUNTER — Other Ambulatory Visit: Payer: Self-pay | Admitting: Neurological Surgery

## 2017-12-28 DIAGNOSIS — R5383 Other fatigue: Secondary | ICD-10-CM | POA: Diagnosis not present

## 2017-12-28 DIAGNOSIS — M810 Age-related osteoporosis without current pathological fracture: Secondary | ICD-10-CM | POA: Diagnosis not present

## 2017-12-28 DIAGNOSIS — E559 Vitamin D deficiency, unspecified: Secondary | ICD-10-CM | POA: Diagnosis not present

## 2018-01-10 DIAGNOSIS — M545 Low back pain: Secondary | ICD-10-CM | POA: Diagnosis not present

## 2018-01-10 DIAGNOSIS — M810 Age-related osteoporosis without current pathological fracture: Secondary | ICD-10-CM | POA: Diagnosis not present

## 2018-01-30 NOTE — Pre-Procedure Instructions (Signed)
DESIRE FULP  01/30/2018      [facility] - [place], [place] - [address] [place] [address] Phone: [phone] Fax: [phone]  [facility] [place], [place] [address] [place] [address] Phone: [phone] Fax: [phone]    Your procedure is scheduled on Tuesday, December 10th.  Report to [facility] Admitting at 5:30 A.M.  Call this number if you have problems the morning of surgery:  [phone]   Remember:  Do not eat or drink after midnight.   Take these medicines the morning of surgery with A SIP OF WATER  ALPRAZolam (XANAX)-as needed buPROPion (WELLBUTRIN XL) carboxymethylcellulose (REFRESH) diazepam (VALIUM)-as needed diphenhydrAMINE (BENADRYL)-as needed fexofenadine (ALLEGRA) levothyroxine (SYNTHROID, LEVOTHROID) memantine (NAMENDA) oxyCODONE-acetaminophen (PERCOCET/ROXICET)-as needed pantoprazole (PROTONIX)-as needed traMADol (ULTRAM)-as needed venlafaxine XR (EFFEXOR-XR) sodium chloride (OCEAN)  7 days prior to surgery STOP taking any Aspirin(unless otherwise instructed by your surgeon), Aleve, Naproxen, Ibuprofen, Motrin, Advil, Goody's, BC's, all herbal medications, fish oil, and all vitamins.     Do not wear jewelry, make-up or nail polish.  Do not wear lotions, powders, or perfumes, or deodorant.  Do not shave 48 hours prior to surgery.    Do not bring valuables to the hospital.  [facility] is not responsible for any belongings or valuables.  Contacts, dentures or bridgework may not be worn into surgery.  Leave your suitcase in the car.  After surgery it may be brought to your room.  For patients admitted to the hospital, discharge time will be determined by your treatment team.  Patients discharged the day of surgery will not be allowed to drive home.   Special instructions:   [place]- Preparing For Surgery  Before surgery,  you can play an important role. Because skin is not sterile, your skin needs to be as free of germs as possible. You can reduce the number of germs on your skin by washing with CHG (chlorahexidine gluconate) Soap before surgery.  CHG is an antiseptic cleaner which kills germs and bonds with the skin to continue killing germs even after washing.    Oral Hygiene is also important to reduce your risk of infection.  Remember - BRUSH YOUR TEETH THE MORNING OF SURGERY WITH YOUR REGULAR TOOTHPASTE  Please do not use if you have an allergy to CHG or antibacterial soaps. If your skin becomes reddened/irritated stop using the CHG.  Do not shave (including legs and underarms) for at least 48 hours prior to first CHG shower. It is OK to shave your face.  Please follow these instructions carefully.   1. Shower the NIGHT BEFORE SURGERY and the MORNING OF SURGERY with CHG.   2. If you chose to wash your hair, wash your hair first as usual with your normal shampoo.  3. After you shampoo, rinse your hair and body thoroughly to remove the shampoo.  4. Use CHG as you would any other liquid soap. You can apply CHG directly to the skin and wash gently with a scrungie or a clean washcloth.   5. Apply the CHG Soap to your body ONLY FROM THE NECK DOWN.  Do not use on open wounds or open sores. Avoid contact with your eyes, ears, mouth and genitals (private parts). Wash Face and genitals (private parts)  with your normal soap.  6. Wash thoroughly, paying special attention to the area where your surgery will be  performed.  7. Thoroughly rinse your body with warm water from the neck down.  8. DO NOT shower/wash with your normal soap after using and rinsing off the CHG Soap.  9. Pat yourself dry with a CLEAN TOWEL.  10. Wear CLEAN PAJAMAS to bed the night before surgery, wear comfortable clothes the morning of surgery  11. Place CLEAN SHEETS on your bed the night of your first shower and DO NOT SLEEP WITH  PETS.    Day of Surgery:  Do not apply any deodorants/lotions.  Please wear clean clothes to the hospital/surgery center.   Remember to brush your teeth WITH YOUR REGULAR TOOTHPASTE.   Please read over the following fact sheets that you were given.

## 2018-01-31 ENCOUNTER — Encounter (HOSPITAL_COMMUNITY)
Admission: RE | Admit: 2018-01-31 | Discharge: 2018-01-31 | Disposition: A | Discharge status: Home / Self Care | Payer: PPO | Source: Ambulatory Visit | Attending: Neurological Surgery | Admitting: Neurological Surgery

## 2018-01-31 ENCOUNTER — Other Ambulatory Visit: Payer: Self-pay

## 2018-01-31 ENCOUNTER — Encounter (HOSPITAL_COMMUNITY): Payer: Self-pay

## 2018-01-31 DIAGNOSIS — I10 Essential (primary) hypertension: Secondary | ICD-10-CM | POA: Diagnosis not present

## 2018-01-31 DIAGNOSIS — Z01818 Encounter for other preprocedural examination: Secondary | ICD-10-CM | POA: Insufficient documentation

## 2018-01-31 HISTORY — DX: Unspecified osteoarthritis, unspecified site: M19.90

## 2018-01-31 HISTORY — DX: Other complications of anesthesia, initial encounter: T88.59XA

## 2018-01-31 HISTORY — DX: Adverse effect of unspecified anesthetic, initial encounter: T41.45XA

## 2018-01-31 LAB — BASIC METABOLIC PANEL
Anion gap: 9 (ref 5–15)
BUN: 8 mg/dL (ref 8–23)
CO2: 25 mmol/L (ref 22–32)
Calcium: 8.7 mg/dL — ABNORMAL LOW (ref 8.9–10.3)
Chloride: 104 mmol/L (ref 98–111)
Creatinine, Ser: 1.04 mg/dL — ABNORMAL HIGH (ref 0.44–1.00)
GFR calc Af Amer: >60 mL/min (ref >60)
GFR calc non Af Amer: 54 mL/min — ABNORMAL LOW (ref >60)
Glucose, Bld: 83 mg/dL (ref 70–99)
Potassium: 4 mmol/L (ref 3.5–5.1)
Sodium: 138 mmol/L (ref 135–145)

## 2018-01-31 LAB — TYPE AND SCREEN
ABO/RH(D): O POS
Antibody Screen: NEGATIVE

## 2018-01-31 LAB — CBC
HCT: 40.2 % (ref 36.0–46.0)
Hemoglobin: 13.1 g/dL (ref 12.0–15.0)
MCH: 32.4 pg (ref 26.0–34.0)
MCHC: 32.6 g/dL (ref 30.0–36.0)
MCV: 99.5 fL (ref 80.0–100.0)
Platelets: 216 10*3/uL (ref 150–400)
RBC: 4.04 MIL/uL (ref 3.87–5.11)
RDW: 12.6 % (ref 11.5–15.5)
WBC: 9.3 10*3/uL (ref 4.0–10.5)
nRBC: 0 % (ref 0.0–0.2)

## 2018-01-31 LAB — SURGICAL PCR SCREEN
MRSA, PCR: NEGATIVE
Staphylococcus aureus: POSITIVE — AB

## 2018-01-31 NOTE — Progress Notes (Signed)
PCP - Dr. Tedra Senegal Cardiologist -denies   Chest x-ray - N/A EKG - 01/31/18 Stress Test - denies ECHO - denies Cardiac Cath -denies   Sleep Study - denies  Aspirin Instructions: N/A  Anesthesia review: No  Patient denies shortness of breath, fever, cough and chest pain at PAT appointment   Patient verbalized understanding of instructions that were given to them at the PAT appointment. Patient was also instructed that they will need to review over the PAT instructions again at home before surgery.

## 2018-01-31 NOTE — Progress Notes (Signed)
Mupirocin ointment prescription called into [facility]. Pt notified of prescription and will pick up today.   Jacqlyn Larsen, RN

## 2018-02-07 ENCOUNTER — Encounter: Payer: Self-pay | Admitting: Internal Medicine

## 2018-02-07 ENCOUNTER — Ambulatory Visit (INDEPENDENT_AMBULATORY_CARE_PROVIDER_SITE_OTHER): Payer: PPO | Admitting: Internal Medicine

## 2018-02-07 VITALS — BP 100/70 | HR 90 | Temp 98.1°F | Ht 63.0 in | Wt 126.0 lb

## 2018-02-07 DIAGNOSIS — R05 Cough: Secondary | ICD-10-CM | POA: Diagnosis not present

## 2018-02-07 DIAGNOSIS — J0100 Acute maxillary sinusitis, unspecified: Secondary | ICD-10-CM | POA: Diagnosis not present

## 2018-02-07 DIAGNOSIS — R059 Cough, unspecified: Secondary | ICD-10-CM

## 2018-02-07 MED ORDER — AZITHROMYCIN 250 MG PO TABS: ORAL_TABLET | ORAL | 0 refills | Status: DC

## 2018-02-07 MED ORDER — CEFTRIAXONE SODIUM 1 G IJ SOLR
1.0000 g | Freq: Once | INTRAMUSCULAR | Status: AC
  Administered 2018-02-07: 1 g via INTRAMUSCULAR

## 2018-02-07 NOTE — Progress Notes (Signed)
   Subjective:    Patient ID: Megan Williams, female    DOB: [birth year], 72 y.o.   MRN: [id number]  HPI She went to church Christmas party on Thursday of last week and has come down with a respiratory infection.  Has discolored nasal drainage and cough.  No fever or shaking chills.  Slight sore throat.  She was scheduled this week to have back surgery by Dr. Ellene Route but this had to be postponed.    Review of Systems she sounds hoarse when she speaks.  Nasally congested when she speaks.     Objective:   Physical Exam Skin warm and dry.  No anterior cervical nodes.  Pharynx is slightly injected without exudate.  TMs are dull bilaterally but not red.  Neck is supple.  Chest clear to auscultation without rales or wheezing.       Assessment & Plan:  Acute maxillary sinusitis  Plan: Rocephin 1 g IM.  Zithromax Z-PAK take 2 tablets day 1 followed by 1 tablet days 2 through 5.  This worked well for her in March when she had similar symptoms.  Rest and drink plenty of fluids.  Expect she can reschedule surgery in 10 days to 2 weeks.

## 2018-02-07 NOTE — Patient Instructions (Signed)
1 g IM Rocephin given in office.  Take Zithromax Z-PAK 2 tablets day 1 followed by 1 tablet days 2 through 5.  Rest and drink plenty of fluids.

## 2018-02-13 MED ORDER — VANCOMYCIN HCL IN DEXTROSE 1-5 GM/200ML-% IV SOLN
1000.0000 mg | INTRAVENOUS | Status: AC
  Administered 2018-02-14: 1000 mg via INTRAVENOUS
  Filled 2018-02-13: qty 200

## 2018-02-14 ENCOUNTER — Encounter (HOSPITAL_COMMUNITY): Payer: Self-pay | Admitting: *Deleted

## 2018-02-14 ENCOUNTER — Encounter (HOSPITAL_COMMUNITY)
Admission: RE | Disposition: A | Discharge status: Home Health | Payer: Self-pay | Source: Home / Self Care | Attending: Neurological Surgery

## 2018-02-14 ENCOUNTER — Inpatient Hospital Stay (HOSPITAL_COMMUNITY): Payer: PPO | Admitting: Anesthesiology

## 2018-02-14 ENCOUNTER — Inpatient Hospital Stay (HOSPITAL_COMMUNITY): Payer: PPO

## 2018-02-14 ENCOUNTER — Inpatient Hospital Stay (HOSPITAL_COMMUNITY)
Admission: RE | Admit: 2018-02-14 | Discharge: 2018-02-18 | DRG: 454 | Disposition: A | Discharge status: Home Health | Payer: PPO | Attending: Neurological Surgery | Admitting: Neurological Surgery

## 2018-02-14 DIAGNOSIS — E039 Hypothyroidism, unspecified: Secondary | ICD-10-CM | POA: Diagnosis present

## 2018-02-14 DIAGNOSIS — Z91040 Latex allergy status: Secondary | ICD-10-CM

## 2018-02-14 DIAGNOSIS — F17200 Nicotine dependence, unspecified, uncomplicated: Secondary | ICD-10-CM | POA: Diagnosis not present

## 2018-02-14 DIAGNOSIS — M5116 Intervertebral disc disorders with radiculopathy, lumbar region: Secondary | ICD-10-CM | POA: Diagnosis present

## 2018-02-14 DIAGNOSIS — Z9842 Cataract extraction status, left eye: Secondary | ICD-10-CM

## 2018-02-14 DIAGNOSIS — M1990 Unspecified osteoarthritis, unspecified site: Secondary | ICD-10-CM | POA: Diagnosis not present

## 2018-02-14 DIAGNOSIS — F419 Anxiety disorder, unspecified: Secondary | ICD-10-CM | POA: Diagnosis present

## 2018-02-14 DIAGNOSIS — M8580 Other specified disorders of bone density and structure, unspecified site: Secondary | ICD-10-CM | POA: Diagnosis not present

## 2018-02-14 DIAGNOSIS — Z9049 Acquired absence of other specified parts of digestive tract: Secondary | ICD-10-CM | POA: Diagnosis not present

## 2018-02-14 DIAGNOSIS — J449 Chronic obstructive pulmonary disease, unspecified: Secondary | ICD-10-CM | POA: Diagnosis not present

## 2018-02-14 DIAGNOSIS — M4326 Fusion of spine, lumbar region: Secondary | ICD-10-CM | POA: Diagnosis not present

## 2018-02-14 DIAGNOSIS — E559 Vitamin D deficiency, unspecified: Secondary | ICD-10-CM | POA: Diagnosis not present

## 2018-02-14 DIAGNOSIS — M4726 Other spondylosis with radiculopathy, lumbar region: Secondary | ICD-10-CM | POA: Diagnosis not present

## 2018-02-14 DIAGNOSIS — D62 Acute posthemorrhagic anemia: Secondary | ICD-10-CM | POA: Diagnosis not present

## 2018-02-14 DIAGNOSIS — G9611 Dural tear: Secondary | ICD-10-CM | POA: Diagnosis not present

## 2018-02-14 DIAGNOSIS — M48062 Spinal stenosis, lumbar region with neurogenic claudication: Secondary | ICD-10-CM | POA: Diagnosis present

## 2018-02-14 DIAGNOSIS — F329 Major depressive disorder, single episode, unspecified: Secondary | ICD-10-CM | POA: Diagnosis present

## 2018-02-14 DIAGNOSIS — M47896 Other spondylosis, lumbar region: Secondary | ICD-10-CM | POA: Diagnosis present

## 2018-02-14 DIAGNOSIS — Z881 Allergy status to other antibiotic agents status: Secondary | ICD-10-CM | POA: Diagnosis not present

## 2018-02-14 DIAGNOSIS — I10 Essential (primary) hypertension: Secondary | ICD-10-CM | POA: Diagnosis present

## 2018-02-14 DIAGNOSIS — E785 Hyperlipidemia, unspecified: Secondary | ICD-10-CM | POA: Diagnosis not present

## 2018-02-14 DIAGNOSIS — J439 Emphysema, unspecified: Secondary | ICD-10-CM | POA: Diagnosis present

## 2018-02-14 DIAGNOSIS — Z880 Allergy status to penicillin: Secondary | ICD-10-CM

## 2018-02-14 DIAGNOSIS — Z9841 Cataract extraction status, right eye: Secondary | ICD-10-CM | POA: Diagnosis not present

## 2018-02-14 DIAGNOSIS — M48061 Spinal stenosis, lumbar region without neurogenic claudication: Secondary | ICD-10-CM | POA: Diagnosis not present

## 2018-02-14 DIAGNOSIS — Z981 Arthrodesis status: Secondary | ICD-10-CM

## 2018-02-14 DIAGNOSIS — M5416 Radiculopathy, lumbar region: Secondary | ICD-10-CM | POA: Diagnosis not present

## 2018-02-14 DIAGNOSIS — Z90710 Acquired absence of both cervix and uterus: Secondary | ICD-10-CM | POA: Diagnosis not present

## 2018-02-14 DIAGNOSIS — Z419 Encounter for procedure for purposes other than remedying health state, unspecified: Secondary | ICD-10-CM

## 2018-02-14 HISTORY — PX: BACK SURGERY: SHX140

## 2018-02-14 SURGERY — POSTERIOR LUMBAR FUSION 1 LEVEL
Anesthesia: General | Site: Back

## 2018-02-14 MED ORDER — OXYCODONE HCL 5 MG PO TABS
5.0000 mg | ORAL_TABLET | Freq: Once | ORAL | Status: AC | PRN
  Administered 2018-02-14: 5 mg via ORAL

## 2018-02-14 MED ORDER — SODIUM CHLORIDE 0.9% FLUSH
3.0000 mL | Freq: Two times a day (BID) | INTRAVENOUS | Status: DC
  Administered 2018-02-14 – 2018-02-17 (×7): 3 mL via INTRAVENOUS

## 2018-02-14 MED ORDER — MAGNESIUM OXIDE 400 (241.3 MG) MG PO TABS
200.0000 mg | ORAL_TABLET | Freq: Every day | ORAL | Status: DC
  Administered 2018-02-15 – 2018-02-18 (×4): 200 mg via ORAL
  Filled 2018-02-14 (×4): qty 1

## 2018-02-14 MED ORDER — TRAZODONE HCL 50 MG PO TABS
150.0000 mg | ORAL_TABLET | Freq: Every day | ORAL | Status: DC
  Administered 2018-02-14 – 2018-02-17 (×4): 150 mg via ORAL
  Filled 2018-02-14 (×4): qty 3

## 2018-02-14 MED ORDER — ALUM & MAG HYDROXIDE-SIMETH 200-200-20 MG/5ML PO SUSP
30.0000 mL | Freq: Four times a day (QID) | ORAL | Status: DC | PRN
  Administered 2018-02-16: 30 mL via ORAL
  Filled 2018-02-14: qty 30

## 2018-02-14 MED ORDER — LIDOCAINE-EPINEPHRINE 1 %-1:100000 IJ SOLN
INTRAMUSCULAR | Status: AC
  Filled 2018-02-14: qty 1

## 2018-02-14 MED ORDER — LIDOCAINE 2% (20 MG/ML) 5 ML SYRINGE
INTRAMUSCULAR | Status: DC | PRN
  Administered 2018-02-14: 80 mg via INTRAVENOUS

## 2018-02-14 MED ORDER — SODIUM CHLORIDE 0.9 % IV BOLUS
500.0000 mL | Freq: Once | INTRAVENOUS | Status: AC
  Administered 2018-02-14: 500 mL via INTRAVENOUS

## 2018-02-14 MED ORDER — SALINE SPRAY 0.65 % NA SOLN
1.0000 | NASAL | Status: DC | PRN
  Filled 2018-02-14: qty 44

## 2018-02-14 MED ORDER — OXYCODONE HCL 5 MG/5ML PO SOLN: 5.0000 mg | Freq: Once | ORAL | Status: AC | PRN

## 2018-02-14 MED ORDER — ALPRAZOLAM 0.5 MG PO TABS
0.5000 mg | ORAL_TABLET | Freq: Two times a day (BID) | ORAL | Status: DC | PRN
  Administered 2018-02-15 – 2018-02-16 (×2): 1 mg via ORAL
  Filled 2018-02-14 (×3): qty 2

## 2018-02-14 MED ORDER — METHOCARBAMOL 500 MG PO TABS
500.0000 mg | ORAL_TABLET | Freq: Four times a day (QID) | ORAL | Status: DC | PRN
  Administered 2018-02-14 – 2018-02-18 (×9): 500 mg via ORAL
  Filled 2018-02-14 (×7): qty 1

## 2018-02-14 MED ORDER — MORPHINE SULFATE (PF) 2 MG/ML IV SOLN: 2.0000 mg | INTRAVENOUS | Status: DC | PRN

## 2018-02-14 MED ORDER — ACETAMINOPHEN 325 MG PO TABS: 650.0000 mg | ORAL_TABLET | ORAL | Status: DC | PRN

## 2018-02-14 MED ORDER — KETOROLAC TROMETHAMINE 15 MG/ML IJ SOLN
INTRAMUSCULAR | Status: AC
  Filled 2018-02-14: qty 1

## 2018-02-14 MED ORDER — FENTANYL CITRATE (PF) 100 MCG/2ML IJ SOLN: 25.0000 ug | INTRAMUSCULAR | Status: DC | PRN

## 2018-02-14 MED ORDER — POLYVINYL ALCOHOL 1.4 % OP SOLN
1.0000 [drp] | Freq: Four times a day (QID) | OPHTHALMIC | Status: DC
  Administered 2018-02-14 – 2018-02-18 (×14): 1 [drp] via OPHTHALMIC
  Filled 2018-02-14: qty 15

## 2018-02-14 MED ORDER — PANTOPRAZOLE SODIUM 20 MG PO TBEC: 20.0000 mg | DELAYED_RELEASE_TABLET | Freq: Every day | ORAL | Status: DC | PRN

## 2018-02-14 MED ORDER — LIDOCAINE-EPINEPHRINE 1 %-1:100000 IJ SOLN
INTRAMUSCULAR | Status: DC | PRN
  Administered 2018-02-14: 5 mL

## 2018-02-14 MED ORDER — METHOCARBAMOL 500 MG PO TABS
ORAL_TABLET | ORAL | Status: AC
  Filled 2018-02-14: qty 1

## 2018-02-14 MED ORDER — FLEET ENEMA 7-19 GM/118ML RE ENEM: 1.0000 | ENEMA | Freq: Once | RECTAL | Status: DC | PRN

## 2018-02-14 MED ORDER — MIDAZOLAM HCL 2 MG/2ML IJ SOLN
INTRAMUSCULAR | Status: DC | PRN
  Administered 2018-02-14: 2 mg via INTRAVENOUS

## 2018-02-14 MED ORDER — POLYETHYLENE GLYCOL 3350 17 G PO PACK: 17.0000 g | PACK | Freq: Every day | ORAL | Status: DC | PRN

## 2018-02-14 MED ORDER — THROMBIN 5000 UNITS EX SOLR
OROMUCOSAL | Status: DC | PRN
  Administered 2018-02-14: 12:00:00 via TOPICAL

## 2018-02-14 MED ORDER — PHENOL 1.4 % MT LIQD: 1.0000 | OROMUCOSAL | Status: DC | PRN

## 2018-02-14 MED ORDER — SODIUM CHLORIDE 0.9 % IV SOLN: 250.0000 mL | INTRAVENOUS | Status: DC

## 2018-02-14 MED ORDER — TRAMADOL HCL 50 MG PO TABS
50.0000 mg | ORAL_TABLET | Freq: Four times a day (QID) | ORAL | Status: DC | PRN
  Administered 2018-02-16: 50 mg via ORAL
  Filled 2018-02-14: qty 1

## 2018-02-14 MED ORDER — ESTRADIOL 1 MG PO TABS
1.0000 mg | ORAL_TABLET | Freq: Every day | ORAL | Status: DC
  Administered 2018-02-15 – 2018-02-18 (×4): 1 mg via ORAL
  Filled 2018-02-14 (×5): qty 1

## 2018-02-14 MED ORDER — FENTANYL CITRATE (PF) 250 MCG/5ML IJ SOLN
INTRAMUSCULAR | Status: AC
  Filled 2018-02-14: qty 5

## 2018-02-14 MED ORDER — DEXAMETHASONE SODIUM PHOSPHATE 10 MG/ML IJ SOLN
INTRAMUSCULAR | Status: DC | PRN
  Administered 2018-02-14: 10 mg via INTRAVENOUS

## 2018-02-14 MED ORDER — CHLORHEXIDINE GLUCONATE CLOTH 2 % EX PADS: 6.0000 | MEDICATED_PAD | Freq: Once | CUTANEOUS | Status: DC

## 2018-02-14 MED ORDER — DIAZEPAM 5 MG PO TABS
5.0000 mg | ORAL_TABLET | Freq: Four times a day (QID) | ORAL | Status: DC | PRN
  Administered 2018-02-15 – 2018-02-17 (×4): 5 mg via ORAL
  Filled 2018-02-14 (×4): qty 1

## 2018-02-14 MED ORDER — OXYCODONE HCL 5 MG PO TABS
ORAL_TABLET | ORAL | Status: AC
  Filled 2018-02-14: qty 1

## 2018-02-14 MED ORDER — 0.9 % SODIUM CHLORIDE (POUR BTL) OPTIME
TOPICAL | Status: DC | PRN
  Administered 2018-02-14: 1000 mL

## 2018-02-14 MED ORDER — ONDANSETRON HCL 4 MG/2ML IJ SOLN
INTRAMUSCULAR | Status: AC
  Filled 2018-02-14: qty 4

## 2018-02-14 MED ORDER — ONDANSETRON HCL 4 MG/2ML IJ SOLN
INTRAMUSCULAR | Status: AC
  Filled 2018-02-14: qty 2

## 2018-02-14 MED ORDER — ONDANSETRON HCL 4 MG/2ML IJ SOLN: 4.0000 mg | Freq: Four times a day (QID) | INTRAMUSCULAR | Status: DC | PRN

## 2018-02-14 MED ORDER — LACTATED RINGERS IV SOLN: INTRAVENOUS | Status: DC

## 2018-02-14 MED ORDER — ACETAMINOPHEN 160 MG/5ML PO SOLN: 1000.0000 mg | Freq: Once | ORAL | Status: DC | PRN

## 2018-02-14 MED ORDER — PHENYLEPHRINE 40 MCG/ML (10ML) SYRINGE FOR IV PUSH (FOR BLOOD PRESSURE SUPPORT)
PREFILLED_SYRINGE | INTRAVENOUS | Status: AC
  Filled 2018-02-14: qty 10

## 2018-02-14 MED ORDER — THROMBIN 20000 UNITS EX SOLR
CUTANEOUS | Status: AC
  Filled 2018-02-14: qty 20000

## 2018-02-14 MED ORDER — VENLAFAXINE HCL ER 75 MG PO CP24
300.0000 mg | ORAL_CAPSULE | Freq: Every day | ORAL | Status: DC
  Administered 2018-02-15 – 2018-02-18 (×4): 300 mg via ORAL
  Filled 2018-02-14: qty 4
  Filled 2018-02-14: qty 8
  Filled 2018-02-14: qty 4
  Filled 2018-02-14: qty 8
  Filled 2018-02-14 (×2): qty 4

## 2018-02-14 MED ORDER — BUPIVACAINE HCL (PF) 0.5 % IJ SOLN
INTRAMUSCULAR | Status: DC | PRN
  Administered 2018-02-14: 5 mL
  Administered 2018-02-14: 25 mL

## 2018-02-14 MED ORDER — KETOROLAC TROMETHAMINE 15 MG/ML IJ SOLN
7.5000 mg | Freq: Four times a day (QID) | INTRAMUSCULAR | Status: AC
  Administered 2018-02-14 – 2018-02-15 (×4): 7.5 mg via INTRAVENOUS
  Filled 2018-02-14 (×3): qty 1

## 2018-02-14 MED ORDER — FENTANYL CITRATE (PF) 250 MCG/5ML IJ SOLN
INTRAMUSCULAR | Status: DC | PRN
  Administered 2018-02-14: 50 ug via INTRAVENOUS
  Administered 2018-02-14: 100 ug via INTRAVENOUS
  Administered 2018-02-14: 50 ug via INTRAVENOUS
  Administered 2018-02-14: 100 ug via INTRAVENOUS
  Administered 2018-02-14 (×4): 50 ug via INTRAVENOUS

## 2018-02-14 MED ORDER — SODIUM CHLORIDE 0.9 % IV SOLN
INTRAVENOUS | Status: DC | PRN
  Administered 2018-02-14: 12:00:00

## 2018-02-14 MED ORDER — ONDANSETRON HCL 4 MG/2ML IJ SOLN
INTRAMUSCULAR | Status: DC | PRN
  Administered 2018-02-14: 4 mg via INTRAVENOUS

## 2018-02-14 MED ORDER — MIDAZOLAM HCL 2 MG/2ML IJ SOLN
INTRAMUSCULAR | Status: AC
  Filled 2018-02-14: qty 2

## 2018-02-14 MED ORDER — OXYCODONE-ACETAMINOPHEN 5-325 MG PO TABS
1.0000 | ORAL_TABLET | ORAL | Status: DC | PRN
  Administered 2018-02-14: 2 via ORAL
  Administered 2018-02-15 (×2): 1 via ORAL
  Administered 2018-02-15: 2 via ORAL
  Administered 2018-02-16: 1 via ORAL
  Administered 2018-02-16 – 2018-02-17 (×7): 2 via ORAL
  Administered 2018-02-17: 1 via ORAL
  Administered 2018-02-18 (×2): 2 via ORAL
  Filled 2018-02-14 (×2): qty 2
  Filled 2018-02-14 (×2): qty 1
  Filled 2018-02-14 (×9): qty 2
  Filled 2018-02-14 (×3): qty 1

## 2018-02-14 MED ORDER — BISACODYL 10 MG RE SUPP: 10.0000 mg | Freq: Every day | RECTAL | Status: DC | PRN

## 2018-02-14 MED ORDER — DIPHENHYDRAMINE HCL 25 MG PO CAPS: 25.0000 mg | ORAL_CAPSULE | Freq: Every day | ORAL | Status: DC | PRN

## 2018-02-14 MED ORDER — BUPIVACAINE HCL (PF) 0.5 % IJ SOLN
INTRAMUSCULAR | Status: AC
  Filled 2018-02-14: qty 30

## 2018-02-14 MED ORDER — DEXAMETHASONE SODIUM PHOSPHATE 10 MG/ML IJ SOLN
INTRAMUSCULAR | Status: AC
  Filled 2018-02-14: qty 2

## 2018-02-14 MED ORDER — ROCURONIUM BROMIDE 10 MG/ML (PF) SYRINGE
PREFILLED_SYRINGE | INTRAVENOUS | Status: DC | PRN
  Administered 2018-02-14 (×2): 50 mg via INTRAVENOUS

## 2018-02-14 MED ORDER — BUPROPION HCL ER (XL) 150 MG PO TB24
300.0000 mg | ORAL_TABLET | Freq: Every day | ORAL | Status: DC
  Administered 2018-02-15 – 2018-02-18 (×4): 300 mg via ORAL
  Filled 2018-02-14 (×4): qty 2

## 2018-02-14 MED ORDER — LORATADINE 10 MG PO TABS
10.0000 mg | ORAL_TABLET | Freq: Every day | ORAL | Status: DC
  Administered 2018-02-15 – 2018-02-18 (×4): 10 mg via ORAL
  Filled 2018-02-14 (×4): qty 1

## 2018-02-14 MED ORDER — LEVOTHYROXINE SODIUM 100 MCG PO TABS
100.0000 ug | ORAL_TABLET | Freq: Every day | ORAL | Status: DC
  Administered 2018-02-15 – 2018-02-18 (×4): 100 ug via ORAL
  Filled 2018-02-14 (×4): qty 1

## 2018-02-14 MED ORDER — ACETAMINOPHEN 10 MG/ML IV SOLN: 1000.0000 mg | Freq: Once | INTRAVENOUS | Status: DC | PRN

## 2018-02-14 MED ORDER — ONDANSETRON HCL 4 MG PO TABS: 4.0000 mg | ORAL_TABLET | Freq: Four times a day (QID) | ORAL | Status: DC | PRN

## 2018-02-14 MED ORDER — PROPOFOL 10 MG/ML IV BOLUS
INTRAVENOUS | Status: AC
  Filled 2018-02-14: qty 20

## 2018-02-14 MED ORDER — SENNA 8.6 MG PO TABS
1.0000 | ORAL_TABLET | Freq: Two times a day (BID) | ORAL | Status: DC
  Administered 2018-02-14 – 2018-02-18 (×6): 8.6 mg via ORAL
  Filled 2018-02-14 (×7): qty 1

## 2018-02-14 MED ORDER — SUGAMMADEX SODIUM 200 MG/2ML IV SOLN
INTRAVENOUS | Status: DC | PRN
  Administered 2018-02-14: 200 mg via INTRAVENOUS

## 2018-02-14 MED ORDER — ACETAMINOPHEN 500 MG PO TABS: 1000.0000 mg | ORAL_TABLET | Freq: Once | ORAL | Status: DC | PRN

## 2018-02-14 MED ORDER — MENTHOL 3 MG MT LOZG: 1.0000 | LOZENGE | OROMUCOSAL | Status: DC | PRN

## 2018-02-14 MED ORDER — THROMBIN 20000 UNITS EX SOLR
CUTANEOUS | Status: DC | PRN
  Administered 2018-02-14: 12:00:00 via TOPICAL

## 2018-02-14 MED ORDER — THROMBIN 5000 UNITS EX SOLR
CUTANEOUS | Status: AC
  Filled 2018-02-14: qty 5000

## 2018-02-14 MED ORDER — ACETAMINOPHEN 650 MG RE SUPP: 650.0000 mg | RECTAL | Status: DC | PRN

## 2018-02-14 MED ORDER — REFRESH LACRI-LUBE OP OINT: 1.0000 [drp] | TOPICAL_OINTMENT | Freq: Every day | OPHTHALMIC | Status: DC

## 2018-02-14 MED ORDER — LACTATED RINGERS IV SOLN
INTRAVENOUS | Status: DC
  Administered 2018-02-14 (×3): via INTRAVENOUS

## 2018-02-14 MED ORDER — PROPOFOL 10 MG/ML IV BOLUS
INTRAVENOUS | Status: DC | PRN
  Administered 2018-02-14: 150 mg via INTRAVENOUS

## 2018-02-14 MED ORDER — SODIUM CHLORIDE 0.9% FLUSH: 3.0000 mL | INTRAVENOUS | Status: DC | PRN

## 2018-02-14 MED ORDER — DOCUSATE SODIUM 100 MG PO CAPS
100.0000 mg | ORAL_CAPSULE | Freq: Two times a day (BID) | ORAL | Status: DC
  Administered 2018-02-14 – 2018-02-18 (×5): 100 mg via ORAL
  Filled 2018-02-14 (×6): qty 1

## 2018-02-14 MED ORDER — METHOCARBAMOL 1000 MG/10ML IJ SOLN
500.0000 mg | Freq: Four times a day (QID) | INTRAVENOUS | Status: DC | PRN
  Filled 2018-02-14: qty 5

## 2018-02-14 MED ORDER — MEMANTINE HCL 10 MG PO TABS
10.0000 mg | ORAL_TABLET | Freq: Two times a day (BID) | ORAL | Status: DC
  Administered 2018-02-14 – 2018-02-18 (×8): 10 mg via ORAL
  Filled 2018-02-14 (×8): qty 1

## 2018-02-14 SURGICAL SUPPLY — 70 items
ADH SKN CLS APL DERMABOND .7 (GAUZE/BANDAGES/DRESSINGS) ×1
APL SRG 60D 8 XTD TIP BNDBL (TIP)
BAG DECANTER FOR FLEXI CONT (MISCELLANEOUS) ×3 IMPLANT
BASKET BONE COLLECTION (BASKET) ×3 IMPLANT
BLADE CLIPPER SURG (BLADE) IMPLANT
BONE CANC CHIPS 20CC PCAN1/4 (Bone Implant) ×3 IMPLANT
BUR MATCHSTICK NEURO 3.0 LAGG (BURR) ×3 IMPLANT
CAGE COROENT MP 8X9X23M-8 SPIN (Cage) ×4 IMPLANT
CANISTER SUCT 3000ML PPV (MISCELLANEOUS) ×3 IMPLANT
CHIPS CANC BONE 20CC PCAN1/4 (Bone Implant) ×1 IMPLANT
CONT SPEC 4OZ CLIKSEAL STRL BL (MISCELLANEOUS) ×3 IMPLANT
COVER BACK TABLE 60X90IN (DRAPES) ×3 IMPLANT
COVER WAND RF STERILE (DRAPES) ×1 IMPLANT
DECANTER SPIKE VIAL GLASS SM (MISCELLANEOUS) ×3 IMPLANT
DERMABOND ADVANCED (GAUZE/BANDAGES/DRESSINGS) ×2
DERMABOND ADVANCED .7 DNX12 (GAUZE/BANDAGES/DRESSINGS) ×1 IMPLANT
DEVICE DISSECT PLASMABLAD 3.0S (MISCELLANEOUS) ×1 IMPLANT
DRAPE C-ARM 42X72 X-RAY (DRAPES) ×6 IMPLANT
DRAPE HALF SHEET 40X57 (DRAPES) IMPLANT
DRAPE LAPAROTOMY 100X72X124 (DRAPES) ×3 IMPLANT
DRAPE SURG IRRIG POUCH 19X23 (DRAPES) ×2 IMPLANT
DURAPREP 26ML APPLICATOR (WOUND CARE) ×3 IMPLANT
DURASEAL APPLICATOR TIP (TIP) IMPLANT
DURASEAL SPINE SEALANT 3ML (MISCELLANEOUS) IMPLANT
ELECT REM PT RETURN 9FT ADLT (ELECTROSURGICAL) ×3
ELECTRODE REM PT RTRN 9FT ADLT (ELECTROSURGICAL) ×1 IMPLANT
GAUZE 4X4 16PLY RFD (DISPOSABLE) IMPLANT
GAUZE SPONGE 4X4 12PLY STRL (GAUZE/BANDAGES/DRESSINGS) ×3 IMPLANT
GLOVE BIOGEL PI IND STRL 8.5 (GLOVE) ×2 IMPLANT
GLOVE BIOGEL PI INDICATOR 8.5 (GLOVE) ×6
GLOVE ECLIPSE 8.5 STRL (GLOVE) ×2 IMPLANT
GLOVE SURG SS PI 8.0 STRL IVOR (GLOVE) ×2 IMPLANT
GLOVE SURG SS PI 8.5 STRL IVOR (GLOVE) ×6
GLOVE SURG SS PI 8.5 STRL STRW (GLOVE) IMPLANT
GOWN STRL REUS W/ TWL LRG LVL3 (GOWN DISPOSABLE) IMPLANT
GOWN STRL REUS W/ TWL XL LVL3 (GOWN DISPOSABLE) IMPLANT
GOWN STRL REUS W/TWL 2XL LVL3 (GOWN DISPOSABLE) ×6 IMPLANT
GOWN STRL REUS W/TWL LRG LVL3 (GOWN DISPOSABLE)
GOWN STRL REUS W/TWL XL LVL3 (GOWN DISPOSABLE)
GRAFT BNE CANC CHIPS 1-8 20CC (Bone Implant) IMPLANT
GRAFT DURAGEN MATRIX 1WX1L (Tissue) ×2 IMPLANT
HEMOSTAT POWDER KIT SURGIFOAM (HEMOSTASIS) ×2 IMPLANT
KIT BASIN OR (CUSTOM PROCEDURE TRAY) ×3 IMPLANT
KIT INFUSE SMALL (Orthopedic Implant) ×2 IMPLANT
KIT TURNOVER KIT B (KITS) ×3 IMPLANT
MILL MEDIUM DISP (BLADE) ×3 IMPLANT
NEEDLE HYPO 22GX1.5 SAFETY (NEEDLE) ×3 IMPLANT
NS IRRIG 1000ML POUR BTL (IV SOLUTION) ×3 IMPLANT
PACK LAMINECTOMY NEURO (CUSTOM PROCEDURE TRAY) ×3 IMPLANT
PAD ARMBOARD 7.5X6 YLW CONV (MISCELLANEOUS) ×9 IMPLANT
PATTIES SURGICAL .5 X1 (DISPOSABLE) ×3 IMPLANT
PLASMABLADE 3.0S (MISCELLANEOUS) ×3
ROD RELINE-O LORD 5.5X40 (Rod) ×2 IMPLANT
ROD RELINE-O LORDOTIC 5.5X60MM (Rod) ×2 IMPLANT
SCREW LOCK RELINE 5.5 TULIP (Screw) ×10 IMPLANT
SCREW RELINE 6.5X35 POLYAXIAL (Screw) ×4 IMPLANT
SCREW RELINE-O POLY 6.5X45 (Screw) ×6 IMPLANT
SPONGE LAP 4X18 RFD (DISPOSABLE) IMPLANT
SPONGE SURGIFOAM ABS GEL 100 (HEMOSTASIS) ×3 IMPLANT
SUT PROLENE 6 0 BV (SUTURE) ×2 IMPLANT
SUT VIC AB 1 CT1 18XBRD ANBCTR (SUTURE) ×1 IMPLANT
SUT VIC AB 1 CT1 8-18 (SUTURE) ×3
SUT VIC AB 2-0 CP2 18 (SUTURE) ×3 IMPLANT
SUT VIC AB 3-0 SH 8-18 (SUTURE) ×5 IMPLANT
SYR 3ML LL SCALE MARK (SYRINGE) ×12 IMPLANT
TOWEL GREEN STERILE (TOWEL DISPOSABLE) ×3 IMPLANT
TOWEL GREEN STERILE FF (TOWEL DISPOSABLE) ×3 IMPLANT
TRAY FOLEY METER SIL LF 16FR (CATHETERS) ×2 IMPLANT
TRAY FOLEY MTR SLVR 16FR STAT (SET/KITS/TRAYS/PACK) ×1 IMPLANT
WATER STERILE IRR 1000ML POUR (IV SOLUTION) ×3 IMPLANT

## 2018-02-14 NOTE — H&P (Signed)
Megan Williams is an 72 y.o. female.   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  She had decompression and fusion at L4-L5 a number of years ago and she has now developed adjacent level disease at L3-L4 with a high-grade stenosis at that level.  She was advised regarding surgical decompression and stabilization of L3-L4 and is now admitted for this procedure.  Past Medical History:  Diagnosis Date  . Allergy   . Anxiety   . Arthritis   . Complication of anesthesia    "difficulty waking up. I could hear them but I couldn't wake up"  . COPD (chronic obstructive pulmonary disease) ([place])   . Depression   . Emphysema   . Esophagitis   . Herpes simplex   . Hypertension   . Migraines   . Osteopenia   . Vitamin D deficiency     Past Surgical History:  Procedure Laterality Date  . BACK SURGERY    . BREAST EXCISIONAL BIOPSY Left 1979   Benign   . CATARACT EXTRACTION, BILATERAL    . CERVICAL FUSION    . CHOLECYSTECTOMY    . KYPHOPLASTY  09/19/2015   Dr. Ellene Route  T12  . Thumb surg    . THYROIDECTOMY    . TONSILLECTOMY    . VAGINAL HYSTERECTOMY      Family History  Adopted: Yes  Family history unknown: Yes   Social History:  reports that she has been smoking. She has never used smokeless tobacco. She reports current alcohol use of about 1.0 standard drinks of alcohol per week. She reports that she does not use drugs.  Allergies:  Allergies  Allergen Reactions  . Doxycycline Other (See Comments)    Skin peeling   . Penicillins Hives    Has patient had a PCN reaction causing immediate rash, facial/tongue/throat swelling, SOB or lightheadedness with hypotension: No Has patient had a PCN reaction causing severe rash involving mucus membranes or skin necrosis: No Has patient had a PCN reaction that required hospitalization: No Has patient had a PCN  reaction occurring within the last 10 years: No If all of the above answers are "NO", then may proceed with Cephalosporin use.  . Latex Rash    No medications prior to admission.    No results found for this or any previous visit (from the past 48 hour(s)). No results found.  Review of Systems  Constitutional: Negative.   HENT: Negative.   Eyes: Negative.   Respiratory: Negative.   Cardiovascular: Negative.   Gastrointestinal: Negative.   Genitourinary: Negative.   Musculoskeletal: Positive for back pain.  Skin: Negative.   Neurological:       Pain with walking distances greater than 50 feet in both lower extremities.  Endo/Heme/Allergies: Negative.   Psychiatric/Behavioral: Negative.     There were no vitals taken for this visit. Physical Exam  Constitutional: She is oriented to person, place, and time. She appears well-developed and well-nourished.  HENT:  Head: Normocephalic and atraumatic.  Eyes: Pupils are equal, round, and reactive to light. Conjunctivae and EOM are normal.  Neck: Normal range of motion. Neck supple.  Cardiovascular: Normal rate and regular rhythm.  Respiratory: Effort normal and breath sounds normal.  GI: Soft. Bowel sounds are normal.  Musculoskeletal:     Comments: Right leg raising is positive at 15 degrees Patrick's maneuver is negative bilaterally.  Neurological: She is  alert and oriented to person, place, and time.  Absent deep tendon reflexes in both patella and the Achilles.  Strength decreased in both quadriceps 4 out of 5.  Station and gait is otherwise intact.  Upper extremity strength and reflexes are normal cranial nerve examination is normal.  Skin: Skin is warm and dry.  Psychiatric: She has a normal mood and affect. Her behavior is normal. Judgment and thought content normal.     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  Plan: Bilateral laminotomies decompression of L3 and  L4 posterior lumbar interbody arthrodesis with pedicle fixation L3-L5.  Earleen Newport, MD 02/14/2018, 7:35 AM

## 2018-02-14 NOTE — Anesthesia Procedure Notes (Signed)
Procedure Name: Intubation Date/Time: 02/14/2018 10:49 AM Performed by: Valda Favia, CRNA Pre-anesthesia Checklist: Patient identified, Emergency Drugs available, Suction available and Patient being monitored Patient Re-evaluated:Patient Re-evaluated prior to induction Oxygen Delivery Method: Circle System Utilized Preoxygenation: Pre-oxygenation with 100% oxygen Induction Type: IV induction Ventilation: Mask ventilation without difficulty Laryngoscope Size: Mac and 4 Grade View: Grade I Tube type: Oral Tube size: 7.0 mm Number of attempts: 1 Airway Equipment and Method: Stylet and Oral airway Placement Confirmation: ETT inserted through vocal cords under direct vision,  positive ETCO2 and breath sounds checked- equal and bilateral Secured at: 21 cm Tube secured with: Tape Dental Injury: Teeth and Oropharynx as per pre-operative assessment

## 2018-02-14 NOTE — Anesthesia Preprocedure Evaluation (Signed)
Anesthesia Evaluation  Patient identified by MRN, date of birth, ID band Patient awake    Reviewed: Allergy & Precautions, NPO status , Patient's Chart, lab work & pertinent test results  History of Anesthesia Complications Negative for: history of anesthetic complications  Airway Mallampati: III  TM Distance: >3 FB Neck ROM: Full  Mouth opening: Limited Mouth Opening  Dental  (+) Missing, Chipped,    Pulmonary COPD, Current Smoker,    breath sounds clear to auscultation       Cardiovascular hypertension, (-) angina(-) Past MI and (-) CHF  Rhythm:Regular     Neuro/Psych  Headaches, PSYCHIATRIC DISORDERS Anxiety Depression Lumbar radiculopathy  Neuromuscular disease    GI/Hepatic negative GI ROS, Neg liver ROS,   Endo/Other  Hypothyroidism   Renal/GU negative Renal ROS     Musculoskeletal  (+) Arthritis ,   Abdominal   Peds  Hematology negative hematology ROS (+)   Anesthesia Other Findings   Reproductive/Obstetrics                             Anesthesia Physical Anesthesia Plan  ASA: II  Anesthesia Plan:    Post-op Pain Management:    Induction: Intravenous  PONV Risk Score and Plan: 1 and Ondansetron and Dexamethasone  Airway Management Planned: Oral ETT  Additional Equipment: None  Intra-op Plan:   Post-operative Plan: Extubation in OR  Informed Consent: I have reviewed the patients History and Physical, chart, labs and discussed the procedure including the risks, benefits and alternatives for the proposed anesthesia with the patient or authorized representative who has indicated his/her understanding and acceptance.   Dental advisory given  Plan Discussed with: CRNA and Surgeon  Anesthesia Plan Comments:         Anesthesia Quick Evaluation

## 2018-02-14 NOTE — Plan of Care (Signed)

## 2018-02-14 NOTE — Transfer of Care (Signed)
Immediate Anesthesia Transfer of Care Note  Patient: Megan Williams  Procedure(s) Performed: Lumbar Three-Four Posterior lumbar interbody fusion (N/A Back)  Patient Location: PACU  Anesthesia Type:General  Level of Consciousness: awake, alert  and oriented  Airway & Oxygen Therapy: Patient Spontanous Breathing and Patient connected to nasal cannula oxygen  Post-op Assessment: Report given to RN and Post -op Vital signs reviewed and stable  Post vital signs: Reviewed and stable  Last Vitals:  Vitals Value Taken Time  BP 129/64 02/14/2018  2:43 PM  Temp    Pulse 95 02/14/2018  2:50 PM  Resp 34 02/14/2018  2:50 PM  SpO2 99 % 02/14/2018  2:50 PM  Vitals shown include unvalidated device data.  Last Pain:  Vitals:   02/14/18 0844  TempSrc:   PainSc: 1          Complications: No apparent anesthesia complications

## 2018-02-14 NOTE — Op Note (Signed)
Date of surgery: 02/14/2018 Preoperative diagnosis: Lumbar spinal stenosis L3-L4 with herniated nucleus pulposus and right lumbar radiculopathy.  History of fusion L4-L5. Postoperative diagnosis: Same Procedure: Bilateral laminotomies and decompression of the L3 and L4 nerve roots with more work than required for simple posterior interbody technique.  Posterior lumbar interbody arthrodesis using peek spacers local autograft allograft and infuse L3-L4.  Removal of previously placed posterior interspinous plate fixation and right-sided pedicle screws L4-L5.  Posterior lateral arthrodesis with local autograft allograft and infuse L3-L4 segmental fixation L3-L5 on the right and L3-L4 on the left. Surgeon: Kristeen Miss First Assistant: Erline Levine, MD Anesthesia: General endotracheal Indications: Montanna Mcbain is a 72 year old individuals had a previous decompression fusion at L4-L5.  She developed a large herniated nucleus pulposus at L3-4 on the right.  She had previous discectomy there but now has a large recurrence of the disc in addition to substantial spondylitic changes at L3-L4.  She is advised regarding the need for surgery not only to decompress but stabilize the L3-L4 joint.  Procedure: Patient was brought to the operating room supine on the stretcher.  After the smooth induction of general endotracheal anesthesia, she was turned prone.  The back was prepped with alcohol and DuraPrep and draped in a sterile fashion.  Midline incision was created and carried down to the lumbodorsal fascia which was opened on either side of midline to expose the spinous processes of L3-L4 and L5.  Midline hardware was exposed and the laminotomies at L3 and L4 were isolated and significant soft tissue was removed from the left and right sides.  Laminotomy was then created on the right side removing the inferior margin lamina of L3 out to and including the entirety of the facet at L3-L4.  The yellow ligament was carefully  lifted but there was significant scar tissue and durotomy had occurred the spinal fluid did not leak out as the arachnoid was intact and a large horizontal mattress suture of 6-0 Prolene was used to close this primarily with a pledget of DuraGen underneath.  This was then carefully protected and the laminotomy was created on the opposite side to decompress the common dural tubing remove the redundant thickened yellow ligament in this region.  Then by carefully dissecting on the right side laminotomy was enlarged and the dural edges were noted and ultimately the path of the L4 nerve root inferiorly and the L3 nerve root superiorly were identified and decompressed.  Underneath the dura there is no to be significant disc herniation that was chronic and attached to the underlying dura this was carefully removed in a piecemeal fashion.  The space was then entered at L3-4 and the right and a series of disc shavers were used to release disc from the inferior and superior endplates of L3 and L4 and perform a total discectomy.  An 8 mm spacer was placed in here temporarily while the discectomy was performed on the opposite side entirety of the contents of the disc space were removed down to the anterior longitudinal ligament.  Endplates were carefully decorticated at L3 and L4.  Interbody spacer was then used to size the interspace and was felt that an 8 mm tall 8 degree lordotic 23 mm long spacer would fit best into this interval.  A total of 9 cc of bone graft was packed into the interspace which consisted of autograft allograft and infuse.  Cages packed with infuse autograft and allograft were also placed into the interspace.  Once this was completed  the lateral gutters which had been previously decorticated were packed with the remainder of bone for total volume of about 6 cc on each side.  Pedicle entry sites were then chosen at L3 and 6.5 x 45 mm screws were placed in L3 and should be noted that by this time also the  hardware was removed with the interspinous spacer being removed at L3-4 and the right-sided pedicle screws being removed at L4 and L5.  These were cortical screws that measured 5.5 x 30 mm in length they were replaced with 6.5 x 35 mm screws through the same holes.  At L4 on the left side a 6.5 x 45 mm screws placed.  On the right side a precontoured 70 mm rod was used to connect the screws together and then on the left side a 40 mm rod was used to connect the screws at L3 and L4.  Lateral gutters have been packed with all the bone and then care was taken to make sure that the L3 and L4 nerve roots were well decompressed as was the common dural tube.  The durotomy was inspected and was covered over with a layer of Surgifoam.  No spinal fluid leakage was noted.  The retractors were removed final radiographs were obtained and then the lumbodorsal fascia was closed with #1 Vicryl in interrupted fashion 2-0 Vicryl was used in subcutaneous tissues and 3-0 Vicryl subcuticularly Dermabond was used on the skin.  Blood loss is estimated at 500 cc.  Patient was returned to recovery room in stable condition.

## 2018-02-15 LAB — CBC
HCT: 29.3 % — ABNORMAL LOW (ref 36.0–46.0)
Hemoglobin: 9.9 g/dL — ABNORMAL LOW (ref 12.0–15.0)
MCH: 33.3 pg (ref 26.0–34.0)
MCHC: 33.8 g/dL (ref 30.0–36.0)
MCV: 98.7 fL (ref 80.0–100.0)
Platelets: 212 10*3/uL (ref 150–400)
RBC: 2.97 MIL/uL — ABNORMAL LOW (ref 3.87–5.11)
RDW: 13.2 % (ref 11.5–15.5)
WBC: 12.5 10*3/uL — ABNORMAL HIGH (ref 4.0–10.5)
nRBC: 0 % (ref 0.0–0.2)

## 2018-02-15 LAB — BASIC METABOLIC PANEL
Anion gap: 9 (ref 5–15)
BUN: 5 mg/dL — ABNORMAL LOW (ref 8–23)
CO2: 25 mmol/L (ref 22–32)
Calcium: 8.2 mg/dL — ABNORMAL LOW (ref 8.9–10.3)
Chloride: 104 mmol/L (ref 98–111)
Creatinine, Ser: 0.97 mg/dL (ref 0.44–1.00)
GFR calc Af Amer: >60 mL/min (ref >60)
GFR calc non Af Amer: 59 mL/min — ABNORMAL LOW (ref >60)
Glucose, Bld: 129 mg/dL — ABNORMAL HIGH (ref 70–99)
Potassium: 4.4 mmol/L (ref 3.5–5.1)
Sodium: 138 mmol/L (ref 135–145)

## 2018-02-15 MED ORDER — SODIUM CHLORIDE 0.9 % IV BOLUS
500.0000 mL | Freq: Once | INTRAVENOUS | Status: AC
  Administered 2018-02-15: 500 mL via INTRAVENOUS

## 2018-02-15 NOTE — Progress Notes (Signed)
Orthopedic Tech Progress Note Patient Details:  Megan Williams 1946-02-18 [id number]  Patient ID: Megan Williams, female   DOB: 03-13-1945, 72 y.o.   MRN: [id number] Called in order to [place] 02/15/2018, 9:05 AM

## 2018-02-15 NOTE — Evaluation (Signed)
Physical Therapy Evaluation Patient Details Name: CEYLIN DREIBELBIS MRN: [id number] DOB: 07/08/1945 Today's Date: 02/15/2018   History of Present Illness  Pt is a 72 y/o female now s/p Bilateral laminotomies decompression of L3 and L4 posterior lumbar interbody arthrodesis with pedicle fixation L3-L5. PMHx includes Anxiety, Arthritis, COPD, Depression, Emphysema, Esophagitis, Herpes simplex, Hypertension, hx of previous cervical fusion and spinal surgery     Clinical Impression  Pt presented supine in bed with HOB elevated, awake and willing to participate in therapy session. Prior to admission, pt reported that she was independent with all functional mobility and ADLs. Pt lives with her spouse who will be able to provide 24/7 supervision/assistance upon d/c. Pt currently min guard overall for mobility with use of RW for ambulation. Pt able to recall 3/3 back precautions independently. Pt would continue to benefit from skilled physical therapy services at this time while admitted and after d/c to address the below listed limitations in order to improve overall safety and independence with functional mobility.     Follow Up Recommendations Home health PT;Supervision/Assistance - 24 hour    Equipment Recommendations  Rolling walker with 5" wheels    Recommendations for Other Services       Precautions / Restrictions Precautions Precautions: Back;Fall Precaution Booklet Issued: Yes (comment) Precaution Comments: pt able to recall 3/3 back precautions Required Braces or Orthoses: Spinal Brace Spinal Brace: Lumbar corset;Applied in sitting position Restrictions Weight Bearing Restrictions: No      Mobility  Bed Mobility Overal bed mobility: Needs Assistance Bed Mobility: Rolling;Sidelying to Sit Rolling: Min guard Sidelying to sit: Min guard       General bed mobility comments: good log roll technique, min guard for safety  Transfers Overall transfer level: Needs  assistance Equipment used: Rolling walker (2 wheeled) Transfers: Sit to/from Stand Sit to Stand: Min guard         General transfer comment: cueing for safe hand placement, min guard for safety  Ambulation/Gait Ambulation/Gait assistance: Min guard Gait Distance (Feet): 75 Feet Assistive device: Rolling walker (2 wheeled) Gait Pattern/deviations: Step-through pattern;Decreased stride length Gait velocity: decreased   General Gait Details: pt generally steady with RW; tremor throughout bilateral UEs and trunk present  Stairs            Wheelchair Mobility    Modified Rankin (Stroke Patients Only)       Balance Overall balance assessment: Needs assistance Sitting-balance support: Feet supported Sitting balance-Leahy Scale: Fair Sitting balance - Comments: initital with lateral lean upon sitting up though able to maintain sitting balance with minguard assist   Standing balance support: During functional activity;Bilateral upper extremity supported;Single extremity supported Standing balance-Leahy Scale: Poor Standing balance comment: able to static stand with close minguard; use of UE support/external assist for dynamic mobility                             Pertinent Vitals/Pain Pain Assessment: Faces Faces Pain Scale: Hurts even more Pain Location: back Pain Descriptors / Indicators: Discomfort;Sore;Operative site guarding Pain Intervention(s): Monitored during session;Repositioned    Home Living Family/patient expects to be discharged to:: Private residence Living Arrangements: Spouse/significant other Available Help at Discharge: Family;Available 24 hours/day Type of Home: House Home Access: Stairs to enter Entrance Stairs-Rails: Left Entrance Stairs-Number of Steps: 4 Home Layout: One level Home Equipment: Shower seat - built in;Grab bars - tub/shower      Prior Function Level of Independence: Independent  Hand  Dominance        Extremity/Trunk Assessment   Upper Extremity Assessment Upper Extremity Assessment: Defer to OT evaluation LUE Deficits / Details: noted mild tremors in LUE during grooming task    Lower Extremity Assessment Lower Extremity Assessment: Generalized weakness    Cervical / Trunk Assessment Cervical / Trunk Assessment: Other exceptions Cervical / Trunk Exceptions: s/p spinal surgery  Communication   Communication: No difficulties  Cognition Arousal/Alertness: Awake/alert Behavior During Therapy: WFL for tasks assessed/performed Overall Cognitive Status: Within Functional Limits for tasks assessed                                 General Comments: pt noted with decreased STM during session, requires some repetition.       General Comments      Exercises     Assessment/Plan    PT Assessment Patient needs continued PT services  PT Problem List Decreased strength;Decreased range of motion;Decreased activity tolerance;Decreased mobility;Decreased coordination;Decreased balance;Decreased knowledge of use of DME;Decreased knowledge of precautions;Decreased safety awareness;Pain       PT Treatment Interventions DME instruction;Gait training;Stair training;Functional mobility training;Therapeutic activities;Therapeutic exercise;Balance training;Neuromuscular re-education;Patient/family education    PT Goals (Current goals can be found in the Care Plan section)  Acute Rehab PT Goals Patient Stated Goal: return home with spouse and dogs PT Goal Formulation: With patient Time For Goal Achievement: 03/01/18 Potential to Achieve Goals: Good    Frequency Min 5X/week   Barriers to discharge        Co-evaluation               AM-PAC PT "6 Clicks" Mobility  Outcome Measure Help needed turning from your back to your side while in a flat bed without using bedrails?: None Help needed moving from lying on your back to sitting on the side of a flat  bed without using bedrails?: None Help needed moving to and from a bed to a chair (including a wheelchair)?: A Little Help needed standing up from a chair using your arms (e.g., wheelchair or bedside chair)?: A Little Help needed to walk in hospital room?: A Little Help needed climbing 3-5 steps with a railing? : A Little 6 Click Score: 20    End of Session Equipment Utilized During Treatment: Gait belt;Back brace Activity Tolerance: Patient limited by pain Patient left: with call bell/phone within reach;Other (comment)(on toilet; nurse tech aware) Nurse Communication: Mobility status PT Visit Diagnosis: Other abnormalities of gait and mobility (R26.89);Pain Pain - part of body: (back)    Time: 8850-2774 PT Time Calculation (min) (ACUTE ONLY): 16 min   Charges:   PT Evaluation $PT Eval Moderate Complexity: 1 Mod          Sherie Don, PT, DPT  Acute Rehabilitation Services Pager [phone] Office [place] 02/15/2018, 2:41 PM

## 2018-02-15 NOTE — Progress Notes (Signed)
OT Cancellation Note  Patient Details Name: Megan Williams MRN: [id number] DOB: 03-24-1945   Cancelled Treatment:    Reason Eval/Treat Not Completed: Other (comment); pt currently without lumbar brace in room, RN aware. Will follow up after brace delivered and as schedule permits for OT eval.  Lou Cal, OT Supplemental Rehabilitation Services Pager [phone] Office [phone]   Raymondo Band 02/15/2018, 8:37 AM

## 2018-02-15 NOTE — Evaluation (Signed)
Occupational Therapy Evaluation Patient Details Name: Megan Williams MRN: [id number] DOB: February 18, 1946 Today's Date: 02/15/2018    History of Present Illness Pt is a 72 y/o female now s/p Bilateral laminotomies decompression of L3 and L4 posterior lumbar interbody arthrodesis with pedicle fixation L3-L5. PMHx includes Anxiety, Arthritis, COPD, Depression, Emphysema, Esophagitis, Herpes simplex, Hypertension, hx of previous cervical fusion and spinal surgery    Clinical Impression   This 72 y/o female presents with the above. At baseline pt reports she is independent with ADLs and functional mobility. Pt performing room level mobility this session with use of HHA and minA, though noted pt often reaching out for additional UE support with dynamic movement. She currently requires minA for UB ADL, modA for LB ADL. Pt reports she will return home with spouse who is able to provide assist PRN. She will benefit from continued acute OT services prior to return home to maximize her overall safety and independence with ADLs and mobility. Will follow.     Follow Up Recommendations  Supervision/Assistance - 24 hour;No OT follow up    Equipment Recommendations  3 in 1 bedside commode           Precautions / Restrictions Precautions Precautions: Back;Fall Precaution Booklet Issued: Yes (comment) Precaution Comments: issued and reviewed with pt Required Braces or Orthoses: Spinal Brace Spinal Brace: Lumbar corset;Applied in sitting position Restrictions Weight Bearing Restrictions: No      Mobility Bed Mobility Overal bed mobility: Needs Assistance Bed Mobility: Rolling;Sidelying to Sit Rolling: Mod assist Sidelying to sit: Mod assist       General bed mobility comments: VCs for log roll technique; pt performing from flat bed with use of handrail; assist to guide LEs and UB when transitioning to sidelying, assist for LEs over EOB and light assist for trunk elevation; pt guarded with movements    Transfers Overall transfer level: Needs assistance Equipment used: 1 person hand held assist;None Transfers: Sit to/from Stand Sit to Stand: Min assist         General transfer comment: assist to rise and steady; VCs hand placement and for maintaining back upright     Balance Overall balance assessment: Needs assistance Sitting-balance support: Feet unsupported Sitting balance-Leahy Scale: Fair Sitting balance - Comments: initital with lateral lean upon sitting up though able to maintain sitting balance with minguard assist   Standing balance support: During functional activity;Single extremity supported Standing balance-Leahy Scale: Fair Standing balance comment: able to static stand with close minguard; use of UE support/external assist for dynamic mobility                           ADL either performed or assessed with clinical judgement   ADL Overall ADL's : Needs assistance/impaired Eating/Feeding: Modified independent;Sitting   Grooming: Min guard;Minimal assistance;Standing;Oral care;Wash/dry face Grooming Details (indicate cue type and reason): minguard-minA standing balance; min cues for maintaining back precautions Upper Body Bathing: Min guard;Sitting   Lower Body Bathing: Minimal assistance;Sit to/from stand   Upper Body Dressing : Minimal assistance;Sitting Upper Body Dressing Details (indicate cue type and reason): for brace management, pt with lumbar brace donned upon entry this session Lower Body Dressing: Moderate assistance;Sit to/from stand Lower Body Dressing Details (indicate cue type and reason): pt can perform figure 4 to reach ankles for sock adjustment, currently requires increased assist to fully access feet Toilet Transfer: Minimal assistance;Ambulation;BSC Toilet Transfer Details (indicate cue type and reason): BSC over toilet; use of HHA/minA  for room level mobility Toileting- Clothing Manipulation and Hygiene: Minimal assistance;Sit  to/from stand;Sitting/lateral lean Toileting - Clothing Manipulation Details (indicate cue type and reason): minA for balance while pt performs clothing management (briefs); pt performing peri-care via lateral leans while seated on BSC     Functional mobility during ADLs: Minimal assistance(use of HHA) General ADL Comments: use of HHA for room level mobility though pt often reaching out for additional UE support on items in room, slow and guarded due to pain; initiated education regarding brace management, back precautions and compensatory strategies for performing ADLs and functional transers     Vision         Perception     Praxis      Pertinent Vitals/Pain Pain Assessment: Faces Faces Pain Scale: Hurts even more Pain Location: back Pain Descriptors / Indicators: Discomfort;Sore;Operative site guarding Pain Intervention(s): Limited activity within patient's tolerance;Monitored during session;Repositioned     Hand Dominance     Extremity/Trunk Assessment Upper Extremity Assessment Upper Extremity Assessment: LUE deficits/detail;Overall WFL for tasks assessed LUE Deficits / Details: noted mild tremors in LUE during grooming task   Lower Extremity Assessment Lower Extremity Assessment: Defer to PT evaluation   Cervical / Trunk Assessment Cervical / Trunk Assessment: Other exceptions Cervical / Trunk Exceptions: s/p spinal surgery   Communication Communication Communication: No difficulties   Cognition Arousal/Alertness: Awake/alert Behavior During Therapy: WFL for tasks assessed/performed Overall Cognitive Status: No family/caregiver present to determine baseline cognitive functioning                                 General Comments: pt noted with decreased STM during session, requires some repetition.    General Comments       Exercises     Shoulder Instructions      Home Living Family/patient expects to be discharged to:: Private  residence Living Arrangements: Spouse/significant other Available Help at Discharge: Family;Available 24 hours/day Type of Home: House Home Access: Stairs to enter [employer] of Steps: 4 Entrance Stairs-Rails: Left Home Layout: One level     Bathroom Shower/Tub: Occupational psychologist: Standard     Home Equipment: Shower seat - built in;Grab bars - tub/shower          Prior Functioning/Environment Level of Independence: Independent                 OT Problem List: Decreased strength;Decreased range of motion;Decreased activity tolerance;Impaired balance (sitting and/or standing);Decreased knowledge of precautions;Pain      OT Treatment/Interventions: Self-care/ADL training;Therapeutic exercise;Therapeutic activities;Patient/family education;Balance training    OT Goals(Current goals can be found in the care plan section) Acute Rehab OT Goals Patient Stated Goal: return home with spouse and dogs OT Goal Formulation: With patient Time For Goal Achievement: 03/01/18 Potential to Achieve Goals: Good  OT Frequency: Min 2X/week   Barriers to D/C:            Co-evaluation              AM-PAC OT "6 Clicks" Daily Activity     Outcome Measure Help from another person eating meals?: None Help from another person taking care of personal grooming?: A Little Help from another person toileting, which includes using toliet, bedpan, or urinal?: A Little Help from another person bathing (including washing, rinsing, drying)?: A Little Help from another person to put on and taking off regular upper body clothing?: A Little Help from  another person to put on and taking off regular lower body clothing?: A Lot 6 Click Score: 18   End of Session Equipment Utilized During Treatment: Gait belt;Back brace Nurse Communication: Mobility status  Activity Tolerance: Patient tolerated treatment well Patient left: in chair;with call bell/phone within  reach;with chair alarm set  OT Visit Diagnosis: Unsteadiness on feet (R26.81);Other abnormalities of gait and mobility (R26.89)                Time: 3748-2707 OT Time Calculation (min): 34 min Charges:  OT General Charges $OT Visit: 1 Visit OT Evaluation $OT Eval Moderate Complexity: 1 Mod OT Treatments $Self Care/Home Management : 8-22 mins  Lou Cal, OT Supplemental Rehabilitation Services Pager [phone] Office [place] 02/15/2018, 11:15 AM

## 2018-02-15 NOTE — Progress Notes (Signed)
Patient ID: Megan Williams, female   DOB: 04/12/1945, 72 y.o.   MRN: [id number] Vital signs are stable Patient is hemoglobin is decreased by 3 g consistent with acute blood loss anemia from surgery Will not need transfusion at this point Electrolytes are stable but glucose is slightly elevated Mobilize today okay to shower

## 2018-02-16 ENCOUNTER — Encounter (HOSPITAL_COMMUNITY): Payer: Self-pay | Admitting: Neurological Surgery

## 2018-02-16 ENCOUNTER — Other Ambulatory Visit: Payer: Self-pay

## 2018-02-16 MED ORDER — MAGNESIUM HYDROXIDE 400 MG/5ML PO SUSP
30.0000 mL | Freq: Every day | ORAL | Status: DC | PRN
  Administered 2018-02-16: 30 mL via ORAL
  Filled 2018-02-16: qty 30

## 2018-02-16 MED ORDER — DEXAMETHASONE 2 MG PO TABS
2.0000 mg | ORAL_TABLET | Freq: Two times a day (BID) | ORAL | Status: DC
  Administered 2018-02-16 – 2018-02-18 (×5): 2 mg via ORAL
  Filled 2018-02-16 (×5): qty 1

## 2018-02-16 NOTE — Progress Notes (Signed)
Patient ID: Megan Williams, female   DOB: 05/17/1945, 72 y.o.   MRN: [id number] Patient notes poor night sleep last night with considerable irritability and discomfort Complains mostly of low back pain and pain into both buttocks Motor function appears intact We will add small dose of Decadron to see if this helps ease the discomfort Encourage mobility Add milk of mag to help with bowels.

## 2018-02-16 NOTE — Anesthesia Postprocedure Evaluation (Signed)
Anesthesia Post Note  Patient: Megan Williams  Procedure(s) Performed: Lumbar Three-Four Posterior lumbar interbody fusion (N/A Back)     Patient location during evaluation: PACU Anesthesia Type: General Level of consciousness: awake and alert Pain management: pain level controlled Vital Signs Assessment: post-procedure vital signs reviewed and stable Respiratory status: spontaneous breathing, nonlabored ventilation, respiratory function stable and patient connected to nasal cannula oxygen Cardiovascular status: blood pressure returned to baseline and stable Postop Assessment: no apparent nausea or vomiting Anesthetic complications: no    Last Vitals:  Vitals:   02/16/18 1431 02/16/18 1528  BP: (!) 110/59 118/61  Pulse:  86  Resp: 20 18  Temp: 37.1 C 37.2 C  SpO2: 96% 92%    Last Pain:  Vitals:   02/16/18 1528  TempSrc: Oral  PainSc:                  Tatem Holsonback

## 2018-02-16 NOTE — Progress Notes (Signed)
Physical Therapy Treatment Patient Details Name: Megan Williams MRN: [id number] DOB: 1945-12-08 Today's Date: 02/16/2018    History of Present Illness Pt is a 72 y/o female now s/p Bilateral laminotomies decompression of L3 and L4 posterior lumbar interbody arthrodesis with pedicle fixation L3-L5. PMHx includes Anxiety, Arthritis, COPD, Depression, Emphysema, Esophagitis, Herpes simplex, Hypertension, hx of previous cervical fusion and spinal surgery     PT Comments    Pt very limited this session secondary to increased pain as compared to yesterday. Unable to ambulate as far as previous session. Will plane for further gait training and stair training at next session as appropriate. PT will continue to follow acutely to progress mobility as tolerated.    Follow Up Recommendations  Home health PT;Supervision/Assistance - 24 hour     Equipment Recommendations  Rolling walker with 5" wheels    Recommendations for Other Services       Precautions / Restrictions Precautions Precautions: Back;Fall Precaution Comments: pt able to recall 3/3 back precautions Required Braces or Orthoses: Spinal Brace Spinal Brace: Lumbar corset;Applied in sitting position Restrictions Weight Bearing Restrictions: No    Mobility  Bed Mobility               General bed mobility comments: pt standing in room with nurse tech present upon arrival  Transfers Overall transfer level: Needs assistance Equipment used: Rolling walker (2 wheeled) Transfers: Sit to/from Stand Sit to Stand: Min guard         General transfer comment: cueing for safe hand placement, min guard for safety  Ambulation/Gait Ambulation/Gait assistance: Min guard Gait Distance (Feet): 50 Feet Assistive device: Rolling walker (2 wheeled) Gait Pattern/deviations: Step-through pattern;Decreased stride length Gait velocity: decreased   General Gait Details: pt generally steady with RW; tremor throughout bilateral UEs and  trunk present; pt very limited this session secondary to pain   Stairs             Wheelchair Mobility    Modified Rankin (Stroke Patients Only)       Balance Overall balance assessment: Needs assistance Sitting-balance support: Feet supported Sitting balance-Leahy Scale: Fair     Standing balance support: During functional activity;Bilateral upper extremity supported;Single extremity supported Standing balance-Leahy Scale: Poor                              Cognition Arousal/Alertness: Awake/alert Behavior During Therapy: WFL for tasks assessed/performed Overall Cognitive Status: Within Functional Limits for tasks assessed                                        Exercises      General Comments        Pertinent Vitals/Pain Pain Assessment: Faces Faces Pain Scale: Hurts whole lot Pain Location: back Pain Descriptors / Indicators: Discomfort;Sore;Operative site guarding Pain Intervention(s): Monitored during session;Repositioned    Home Living                      Prior Function            PT Goals (current goals can now be found in the care plan section) Acute Rehab PT Goals PT Goal Formulation: With patient Time For Goal Achievement: 03/01/18 Potential to Achieve Goals: Good Progress towards PT goals: Progressing toward goals    Frequency    Min 5X/week  PT Plan Current plan remains appropriate    Co-evaluation              AM-PAC PT "6 Clicks" Mobility   Outcome Measure  Help needed turning from your back to your side while in a flat bed without using bedrails?: None Help needed moving from lying on your back to sitting on the side of a flat bed without using bedrails?: None Help needed moving to and from a bed to a chair (including a wheelchair)?: A Little Help needed standing up from a chair using your arms (e.g., wheelchair or bedside chair)?: A Little Help needed to walk in hospital  room?: A Little Help needed climbing 3-5 steps with a railing? : A Little 6 Click Score: 20    End of Session Equipment Utilized During Treatment: Gait belt;Back brace Activity Tolerance: Patient limited by pain Patient left: in chair;with call bell/phone within reach;with chair alarm set Nurse Communication: Mobility status PT Visit Diagnosis: Other abnormalities of gait and mobility (R26.89);Pain Pain - part of body: (back)     Time: 1224-8250 PT Time Calculation (min) (ACUTE ONLY): 19 min  Charges:  $Gait Training: 8-22 mins                     Sherie Don, [place], DPT  Acute Rehabilitation Services Pager [phone] Office [place] 02/16/2018, 11:41 AM

## 2018-02-16 NOTE — Care Management Note (Signed)
Case Management Note  Patient Details  Name: Megan Williams MRN: [id number] Date of Birth: 02-18-1946  Subjective/Objective:   Pt s/p lumbar surgery. She is from home with her spouse.  DME: bars in shower and shower stool No issues obtaining her medications. No issues with transportation.                 Action/Plan: PT recommending [facility] services. CM provided the patient choice. She wants to look it over with her spouse.  MD please place [place] orders and F2F.  Pt with orders for walker and 3in 1. CM will have this delivered to her room day of d/c.  CM following.  Expected Discharge Date:                  Expected Discharge Plan:  [place]  In-House Referral:     Discharge planning Services  CM Consult  Post Acute Care Choice:  Home Health Choice offered to:     DME Arranged:    DME Agency:     HH Arranged:    [place] Agency:     Status of Service:  In process, will continue to follow  If discussed at Long Length of Stay Meetings, dates discussed:    Additional Comments:  Pollie Friar, RN 02/16/2018, 3:02 PM

## 2018-02-16 NOTE — Progress Notes (Signed)
Occupational Therapy Treatment Patient Details Name: Megan Williams MRN: [id number] DOB: [birth year] Today's Date: 02/16/2018    History of present illness Pt is a 72 y/o female now s/p Bilateral laminotomies decompression of L3 and L4 posterior lumbar interbody arthrodesis with pedicle fixation L3-L5. PMHx includes Anxiety, Arthritis, COPD, Depression, Emphysema, Esophagitis, Herpes simplex, Hypertension, hx of previous cervical fusion and spinal surgery    OT comments  Pt having increased difficulty performing functional mobility and ADLs this session due to pain. Pt requiring minA for room level mobility using RW given increased time to perform. Pt completing seated grooming ADLs vs standing due to pain and fatigue with prolonged standing, performing with overall setup-minguard assist. Will continue to monitor for pt progress while she remains in acute setting to progress her safety and independence with ADLs and mobility.   Follow Up Recommendations  Supervision/Assistance - 24 hour;No OT follow up    Equipment Recommendations  3 in 1 bedside commode          Precautions / Restrictions Precautions Precautions: Back;Fall Required Braces or Orthoses: Spinal Brace Spinal Brace: Lumbar corset;Applied in sitting position Restrictions Weight Bearing Restrictions: No       Mobility Bed Mobility Overal bed mobility: Needs Assistance Bed Mobility: Sit to Sidelying;Rolling Rolling: Min guard       Sit to sidelying: Mod assist General bed mobility comments: assist for LEs onto EOB  Transfers Overall transfer level: Needs assistance Equipment used: Rolling walker (2 wheeled) Transfers: Sit to/from Stand Sit to Stand: Min assist         General transfer comment: cueing for safe hand placement, assist to rise and steady, and to control descent due to pain    Balance Overall balance assessment: Needs assistance Sitting-balance support: Feet supported Sitting balance-Leahy  Scale: Fair     Standing balance support: During functional activity;Bilateral upper extremity supported;Single extremity supported Standing balance-Leahy Scale: Poor                             ADL either performed or assessed with clinical judgement   ADL Overall ADL's : Needs assistance/impaired     Grooming: Set up;Min guard;Sitting;Wash/dry face;Oral care;Brushing hair Grooming Details (indicate cue type and reason): unalbe to tolerate performing tasks in standing, completing in sitting due to pain/weakness                              Functional mobility during ADLs: Minimal assistance;Rolling walker General ADL Comments: pt with increased pain this session, decreased functional performance compared to previous session     Vision       Perception     Praxis      Cognition Arousal/Alertness: Awake/alert Behavior During Therapy: WFL for tasks assessed/performed Overall Cognitive Status: Within Functional Limits for tasks assessed                                          Exercises     Shoulder Instructions       General Comments      Pertinent Vitals/ Pain       Pain Assessment: Faces Faces Pain Scale: Hurts whole lot Pain Location: back Pain Descriptors / Indicators: Discomfort;Sore;Operative site guarding Pain Intervention(s): Monitored during session;Limited activity within patient's tolerance;Repositioned  Home Living  Prior Functioning/Environment              Frequency  Min 2X/week        Progress Toward Goals  OT Goals(current goals can now be found in the care plan section)  Progress towards OT goals: OT to reassess next treatment  Acute Rehab OT Goals Patient Stated Goal: return home with spouse and dogs OT Goal Formulation: With patient Time For Goal Achievement: 03/01/18 Potential to Achieve Goals: Good  Plan Discharge plan  remains appropriate;Other (comment)(will continue to assess)    Co-evaluation                 AM-PAC OT "6 Clicks" Daily Activity     Outcome Measure   Help from another person eating meals?: None Help from another person taking care of personal grooming?: A Little Help from another person toileting, which includes using toliet, bedpan, or urinal?: A Little Help from another person bathing (including washing, rinsing, drying)?: A Little Help from another person to put on and taking off regular upper body clothing?: A Little Help from another person to put on and taking off regular lower body clothing?: A Lot 6 Click Score: 18    End of Session Equipment Utilized During Treatment: Gait belt;Rolling walker;Back brace  OT Visit Diagnosis: Unsteadiness on feet (R26.81);Other abnormalities of gait and mobility (R26.89)   Activity Tolerance Patient limited by pain   Patient Left in bed;with call bell/phone within reach;with bed alarm set   Nurse Communication Mobility status        Time: 7341-9379 OT Time Calculation (min): 25 min  Charges: OT General Charges $OT Visit: 1 Visit OT Treatments $Self Care/Home Management : 23-37 mins  Lou Cal, [place] Pager [phone] Office [phone]   Raymondo Band 02/16/2018, 4:57 PM

## 2018-02-17 MED ORDER — METHOCARBAMOL 500 MG PO TABS: 500.0000 mg | ORAL_TABLET | Freq: Four times a day (QID) | ORAL | 3 refills | Status: DC | PRN

## 2018-02-17 MED ORDER — OXYCODONE-ACETAMINOPHEN 5-325 MG PO TABS: 1.0000 | ORAL_TABLET | ORAL | 0 refills | Status: DC | PRN

## 2018-02-17 MED ORDER — DEXAMETHASONE 1 MG PO TABS: ORAL_TABLET | ORAL | 0 refills | Status: DC

## 2018-02-17 NOTE — Care Management Important Message (Signed)
Important Message  Patient Details  Name: Megan Williams MRN: [id number] Date of Birth: 1945-05-19   Medicare Important Message Given:  Yes    Orbie Pyo 02/17/2018, 2:12 PM

## 2018-02-17 NOTE — Discharge Summary (Signed)
Physician Discharge Summary  Patient ID: Megan Williams MRN: [id number] DOB/AGE: 10/17/1945 72 y.o.  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  Acute blood loss anemia. Active Problems:   Lumbar stenosis with neurogenic claudication   Discharged Condition: good  Hospital Course: Patient was admitted to undergo surgical decompression and stabilization at L2-3 having had a previous fusion at L4-5.  She tolerated surgery well.  Consults: None  Significant Diagnostic Studies: None  Treatments: Surgery, decompression of L3-4 status post L4-5 arthrodesis.  Discharge Exam: Blood pressure 111/71, pulse 81, temperature 99.1 F (37.3 C), temperature source Oral, resp. rate 20, height 5\' 3"  (1.6 m), weight 56.4 kg, SpO2 97 %. Incision is clean and dry.  Station and gait are intact.  Disposition: Discharge disposition: 01-Home or Self Care       Discharge Instructions    Call MD for:  redness, tenderness, or signs of infection (pain, swelling, redness, odor or green/yellow discharge around incision site)   Complete by:  As directed    Call MD for:  severe uncontrolled pain   Complete by:  As directed    Call MD for:  temperature >100.4   Complete by:  As directed    Diet - low sodium heart healthy   Complete by:  As directed    Discharge instructions   Complete by:  As directed    Okay to shower. Do not apply salves or appointments to incision. No heavy lifting with the upper extremities greater than 15 pounds. May resume driving when not requiring pain medication and patient feels comfortable with doing so.   Incentive spirometry RT   Complete by:  As directed    Increase activity slowly   Complete by:  As directed      Allergies as of 02/17/2018       Reactions   Doxycycline Other (See Comments)   Skin peeling   Penicillins Hives   Has patient had a PCN reaction causing immediate rash, facial/tongue/throat swelling, SOB or lightheadedness with hypotension: No Has patient had a PCN reaction causing severe rash involving mucus membranes or skin necrosis: No Has patient had a PCN reaction that required hospitalization: No Has patient had a PCN reaction occurring within the last 10 years: No If all of the above answers are "NO", then may proceed with Cephalosporin use.   Latex Rash      Medication List    TAKE these medications   ALPRAZolam 1 MG tablet Commonly known as:  XANAX Take 0.5-1 mg by mouth 2 (two) times daily as needed for anxiety.   azithromycin 250 MG tablet Commonly known as:  ZITHROMAX 2 po day 1 followed by one po days 2-5   B-12 1000 MCG Subl Place 1,000 mcg under the tongue daily.   buPROPion 300 MG 24 hr tablet Commonly known as:  WELLBUTRIN XL Take 300 mg by mouth daily.   CHANTIX 0.5 MG tablet Generic drug:  varenicline TAKE 1 TABLET BY MOUTH 2 TIMES DAILY.   denosumab 60 MG/ML Soln injection Commonly known as:  PROLIA Inject 60 mg into the skin every 6 (six) months. Administer in upper arm, thigh, or abdomen   dexamethasone 1 MG tablet Commonly known as:  DECADRON 2 tablets twice daily for 2 days, one tablet twice daily for 2 days, one tablet daily for 2  days.   diazepam 5 MG tablet Commonly known as:  VALIUM Take 5 mg by mouth every 6 (six) hours as needed for muscle spasms.   diphenhydrAMINE 25 MG tablet Commonly known as:  BENADRYL Take 25-50 mg by mouth daily as needed (hives).   estradiol 0.5 MG tablet Commonly known as:  ESTRACE Take 2 tablets (1 mg total) by mouth daily.   fexofenadine 60 MG tablet Commonly known as:  ALLEGRA Take 60 mg by mouth daily.   HM MAGNESIUM 250 MG Tabs Generic drug:  Magnesium Oxide Take 250 mg by mouth daily.   levothyroxine 100 MCG tablet Commonly  known as:  SYNTHROID, LEVOTHROID Take 1 tablet (100 mcg total) by mouth daily.   memantine 10 MG tablet Commonly known as:  NAMENDA Take 1 tablet (10 mg total) by mouth 2 (two) times daily. TAKE 10 mg TABLET BY MOUTH 2 TIMES DAILY. What changed:  additional instructions   methocarbamol 500 MG tablet Commonly known as:  ROBAXIN Take 1 tablet (500 mg total) by mouth every 6 (six) hours as needed for muscle spasms.   multivitamin tablet Take 2 tablets by mouth daily.   naproxen sodium 220 MG tablet Commonly known as:  ALEVE Take 440 mg by mouth daily as needed (pain).   oxyCODONE-acetaminophen 5-325 MG tablet Commonly known as:  PERCOCET/ROXICET Take 1-2 tablets by mouth every 4 (four) hours as needed for moderate pain or severe pain. What changed:  reasons to take this   pantoprazole 40 MG tablet Commonly known as:  PROTONIX Take 1 tablet (40 mg total) by mouth daily. What changed:    how much to take  when to take this  reasons to take this   REFRESH 1 % ophthalmic solution Generic drug:  carboxymethylcellulose Place 1 drop into both eyes 4 (four) times daily.   REFRESH LACRI-LUBE Oint Place 1 drop into both eyes at bedtime.   sodium chloride 0.65 % Soln nasal spray Commonly known as:  OCEAN Place 1 spray into both nostrils as needed for congestion.   traMADol 50 MG tablet Commonly known as:  ULTRAM Take 50 mg by mouth every 6 (six) hours as needed.   traZODone 100 MG tablet Commonly known as:  DESYREL Take 150-200 mg by mouth at bedtime.   valACYclovir 500 MG tablet Commonly known as:  VALTREX TAKE 1 TABLET BY MOUTH TWICE DAILY FOR 3 TO 5 DAYS.   venlafaxine XR 150 MG 24 hr capsule Commonly known as:  EFFEXOR-XR Take 300 mg by mouth daily with breakfast.   VIACTIV CALCIUM PLUS D PO Take 2 tablets by mouth daily.   Vitamin D 50 MCG (2000 UT) tablet Take 2,000 Units by mouth daily.   vitamin k 100 MCG tablet Take 100 mcg by mouth daily.             Durable Medical Equipment  (From admission, onward)         Start     Ordered   02/16/18 1505  For home use only DME 3 n 1  Once     02/16/18 1504   02/16/18 1505  For home use only DME Walker rolling  Once    Question:  Patient needs a walker to treat with the following condition  Answer:  Status post lumbar surgery   02/16/18 1504           Signed: Blanchie Dessert Keywon Mestre 02/17/2018, 7:15 PM

## 2018-02-17 NOTE — Progress Notes (Signed)
Physical Therapy Treatment Patient Details Name: Megan Williams MRN: [id number] DOB: 08-06-1945 Today's Date: 02/17/2018    History of Present Illness Pt is a 72 y/o female now s/p Bilateral laminotomies decompression of L3 and L4 posterior lumbar interbody arthrodesis with pedicle fixation L3-L5. PMHx includes Anxiety, Arthritis, COPD, Depression, Emphysema, Esophagitis, Herpes simplex, Hypertension, hx of previous cervical fusion and spinal surgery     PT Comments    Pt again very limited secondary to pain this session. Unable to tolerate stair training. Pt very anxious throughout. Pt would continue to benefit from skilled physical therapy services at this time while admitted and after d/c to address the below listed limitations in order to improve overall safety and independence with functional mobility.    Follow Up Recommendations  Home health PT;Supervision/Assistance - 24 hour     Equipment Recommendations  Rolling walker with 5" wheels    Recommendations for Other Services       Precautions / Restrictions Precautions Precautions: Back;Fall Precaution Comments: pt able to recall 3/3 back precautions Required Braces or Orthoses: Spinal Brace Spinal Brace: Lumbar corset;Applied in sitting position Restrictions Weight Bearing Restrictions: No    Mobility  Bed Mobility Overal bed mobility: Needs Assistance Bed Mobility: Rolling;Sidelying to Sit Rolling: Min guard Sidelying to sit: Min guard       General bed mobility comments: min guard for safety, cueing for log roll technique  Transfers Overall transfer level: Needs assistance Equipment used: Rolling walker (2 wheeled) Transfers: Sit to/from Stand Sit to Stand: From elevated surface;Min guard         General transfer comment: cueing for hand placement, increased time and effort, bed in elevated position  Ambulation/Gait Ambulation/Gait assistance: Min guard Gait Distance (Feet): 75 Feet Assistive device:  Rolling walker (2 wheeled) Gait Pattern/deviations: Step-through pattern;Decreased stride length Gait velocity: decreased   General Gait Details: pt generally steady with RW; tremor throughout bilateral UEs and trunk present; pt very limited this session secondary to pain   Stairs             Wheelchair Mobility    Modified Rankin (Stroke Patients Only)       Balance Overall balance assessment: Needs assistance Sitting-balance support: Feet supported Sitting balance-Leahy Scale: Fair     Standing balance support: During functional activity;Bilateral upper extremity supported;Single extremity supported Standing balance-Leahy Scale: Poor                              Cognition Arousal/Alertness: Awake/alert Behavior During Therapy: Anxious Overall Cognitive Status: Within Functional Limits for tasks assessed                                        Exercises      General Comments        Pertinent Vitals/Pain Pain Assessment: Faces Faces Pain Scale: Hurts whole lot Pain Location: back Pain Descriptors / Indicators: Discomfort;Sore;Operative site guarding Pain Intervention(s): Monitored during session;Repositioned;Patient requesting pain meds-RN notified    Home Living                      Prior Function            PT Goals (current goals can now be found in the care plan section) Acute Rehab PT Goals PT Goal Formulation: With patient Time For Goal Achievement: 03/01/18  Potential to Achieve Goals: Good Progress towards PT goals: Progressing toward goals    Frequency    Min 5X/week      PT Plan Current plan remains appropriate    Co-evaluation              AM-PAC PT "6 Clicks" Mobility   Outcome Measure  Help needed turning from your back to your side while in a flat bed without using bedrails?: None Help needed moving from lying on your back to sitting on the side of a flat bed without using  bedrails?: None Help needed moving to and from a bed to a chair (including a wheelchair)?: None Help needed standing up from a chair using your arms (e.g., wheelchair or bedside chair)?: A Little Help needed to walk in hospital room?: A Little Help needed climbing 3-5 steps with a railing? : A Lot 6 Click Score: 20    End of Session Equipment Utilized During Treatment: Gait belt;Back brace Activity Tolerance: Patient limited by pain Patient left: in chair;with call bell/phone within reach;with chair alarm set Nurse Communication: Mobility status PT Visit Diagnosis: Other abnormalities of gait and mobility (R26.89);Pain Pain - part of body: (back)     Time: 8850-2774 PT Time Calculation (min) (ACUTE ONLY): 26 min  Charges:  $Gait Training: 8-22 mins $Therapeutic Activity: 8-22 mins                     Sherie Don, [place], DPT  Acute Rehabilitation Services Pager [phone] Office [place] 02/17/2018, 12:56 PM

## 2018-02-17 NOTE — Clinical Social Work Note (Signed)
CSW acknowledges SNF consult. PT recommending HHPT.  CSW signing off. Consult again if any other social work needs arise.  Calton Harshfield, CSW [phone]  

## 2018-02-18 NOTE — Progress Notes (Signed)
Physical Therapy Treatment Patient Details Name: Megan Williams MRN: [id number] DOB: 1945-07-21 Today's Date: 02/18/2018    History of Present Illness Pt is a 72 y/o female now s/p Bilateral laminotomies decompression of L3 and L4 posterior lumbar interbody arthrodesis with pedicle fixation L3-L5. PMHx includes Anxiety, Arthritis, COPD, Depression, Emphysema, Esophagitis, Herpes simplex, Hypertension, hx of previous cervical fusion and spinal surgery     PT Comments    Pt is slow and guarded likley due to pain and axiety.  She is tolerating more activity and able to progress to stair training to prepare for return home with support from her spouse.  Pt able to recall 3/3 spinal precautions and able to adhere with mobility and movement.  Pt able to donn LSO with min Verbal cues to ensure corsett is expanded before application.      Follow Up Recommendations  Home health PT;Supervision/Assistance - 24 hour     Equipment Recommendations  Rolling walker with 5" wheels    Recommendations for Other Services       Precautions / Restrictions Precautions Precautions: Back;Fall Precaution Booklet Issued: Yes (comment) Precaution Comments: pt able to recall 3/3 back precautions Required Braces or Orthoses: Spinal Brace Spinal Brace: Lumbar corset;Applied in sitting position Restrictions Weight Bearing Restrictions: No    Mobility  Bed Mobility               General bed mobility comments: Pt sitting in recliner on arrival.   Transfers Overall transfer level: Needs assistance Equipment used: Rolling walker (2 wheeled) Transfers: Sit to/from Stand Sit to Stand: From elevated surface;Supervision         General transfer comment: Cues for hand placement to and from seated surface  Ambulation/Gait Ambulation/Gait assistance: Min guard;Supervision Gait Distance (Feet): 120 Feet Assistive device: Rolling walker (2 wheeled) Gait Pattern/deviations: Step-through  pattern;Decreased stride length Gait velocity: decreased   General Gait Details: Pt remains guarded and mildly unsteady during gait training.  Pt required cues for RW safety and upper trunk control    Stairs Stairs: Yes Stairs assistance: Supervision Stair Management: One rail Left(L rail ascending) Number of Stairs: 6(4 inch step height) General stair comments: Cues for sequencing and hand placement on rail for support.  Educated on where to place walker so it will be accessible when she enters her home.      Wheelchair Mobility    Modified Rankin (Stroke Patients Only)       Balance Overall balance assessment: Needs assistance   Sitting balance-Leahy Scale: Fair       Standing balance-Leahy Scale: Poor                              Cognition Arousal/Alertness: Awake/alert Behavior During Therapy: Anxious Overall Cognitive Status: Within Functional Limits for tasks assessed                                 General Comments: pt noted with decreased STM during session, requires some repetition.       Exercises      General Comments        Pertinent Vitals/Pain Pain Assessment: Faces Faces Pain Scale: Hurts whole lot Pain Location: back Pain Descriptors / Indicators: Discomfort;Sore;Operative site guarding Pain Intervention(s): Monitored during session;Repositioned    Home Living  Prior Function            PT Goals (current goals can now be found in the care plan section) Acute Rehab PT Goals Patient Stated Goal: return home with spouse and dogs Potential to Achieve Goals: Good Progress towards PT goals: Progressing toward goals    Frequency    Min 5X/week      PT Plan Current plan remains appropriate    Co-evaluation              AM-PAC PT "6 Clicks" Mobility   Outcome Measure  Help needed turning from your back to your side while in a flat bed without using bedrails?: None Help  needed moving from lying on your back to sitting on the side of a flat bed without using bedrails?: None Help needed moving to and from a bed to a chair (including a wheelchair)?: A Little Help needed standing up from a chair using your arms (e.g., wheelchair or bedside chair)?: A Little Help needed to walk in hospital room?: A Little Help needed climbing 3-5 steps with a railing? : A Little 6 Click Score: 20    End of Session Equipment Utilized During Treatment: Gait belt;Back brace Activity Tolerance: Patient limited by pain Patient left: in chair;with call bell/phone within reach;with chair alarm set Nurse Communication: Mobility status PT Visit Diagnosis: Other abnormalities of gait and mobility (R26.89);Pain Pain - part of body: (back)     Time: 1834-3735 PT Time Calculation (min) (ACUTE ONLY): 26 min  Charges:  $Gait Training: 8-22 mins $Therapeutic Activity: 8-22 mins                     Governor Rooks, PTA Acute Rehabilitation Services Pager [phone] Office [phone]     Myriam Brandhorst Eli Hose 02/18/2018, 11:36 AM

## 2018-02-18 NOTE — Care Management Note (Signed)
Case Management Note  Patient Details  Name: Megan Williams MRN: [id number] Date of Birth: 1945/08/07  Subjective/Objective:   Pt s/p lumbar surgery. She is from home with her spouse.  DME: bars in shower and shower stool No issues obtaining her medications. No issues with transportation                 Action/Plan: PT recommending [facility] services. CM provided the patient choice. She wants to look it over with her spouse.  MD please place [facility] orders and F2F.  Pt with orders for walker and 3in 1. CM will have this delivered to her room day of d/c.  CM following  Expected Discharge Date:  02/18/18               Expected Discharge Plan:  [place]  In-House Referral:     Discharge planning Services  CM Consult  Post Acute Care Choice:  Home Health Choice offered to:  Patient  DME Arranged:  3-N-1, Walker rolling DME Agency:  [place]:  PT [facility] Agency:  [place]  Status of Service:  Completed, signed off  If discussed at [place] of Stay Meetings, dates discussed:    Additional Comments: 10/21 Tomi Bamberger RN, BSn-N- per previous NCM note, patient wants [facility] for HHPT, NCM notified Jermaine with [facility] that patient is for dc today and gave referral for HHPT.  Patient has DME already per previous NCM note.  Zenon Mayo, RN 02/18/2018, 9:11 AM

## 2018-02-18 NOTE — Plan of Care (Signed)
Adequate discharge

## 2018-02-20 DIAGNOSIS — J449 Chronic obstructive pulmonary disease, unspecified: Secondary | ICD-10-CM | POA: Diagnosis not present

## 2018-02-20 DIAGNOSIS — M4716 Other spondylosis with myelopathy, lumbar region: Secondary | ICD-10-CM | POA: Diagnosis not present

## 2018-02-20 DIAGNOSIS — F419 Anxiety disorder, unspecified: Secondary | ICD-10-CM | POA: Diagnosis not present

## 2018-02-20 DIAGNOSIS — Z9181 History of falling: Secondary | ICD-10-CM | POA: Diagnosis not present

## 2018-02-20 DIAGNOSIS — M1990 Unspecified osteoarthritis, unspecified site: Secondary | ICD-10-CM | POA: Diagnosis not present

## 2018-02-20 DIAGNOSIS — Z981 Arthrodesis status: Secondary | ICD-10-CM | POA: Diagnosis not present

## 2018-02-20 DIAGNOSIS — F329 Major depressive disorder, single episode, unspecified: Secondary | ICD-10-CM | POA: Diagnosis not present

## 2018-02-20 DIAGNOSIS — Z79891 Long term (current) use of opiate analgesic: Secondary | ICD-10-CM | POA: Diagnosis not present

## 2018-02-20 DIAGNOSIS — I10 Essential (primary) hypertension: Secondary | ICD-10-CM | POA: Diagnosis not present

## 2018-02-20 DIAGNOSIS — M48061 Spinal stenosis, lumbar region without neurogenic claudication: Secondary | ICD-10-CM | POA: Diagnosis not present

## 2018-02-20 DIAGNOSIS — Z4789 Encounter for other orthopedic aftercare: Secondary | ICD-10-CM | POA: Diagnosis not present

## 2018-03-01 DIAGNOSIS — F0280 Dementia in other diseases classified elsewhere without behavioral disturbance: Secondary | ICD-10-CM

## 2018-03-01 HISTORY — DX: Dementia in other diseases classified elsewhere, unspecified severity, without behavioral disturbance, psychotic disturbance, mood disturbance, and anxiety: F02.80

## 2018-03-07 DIAGNOSIS — M48061 Spinal stenosis, lumbar region without neurogenic claudication: Secondary | ICD-10-CM | POA: Diagnosis not present

## 2018-03-16 DIAGNOSIS — M5126 Other intervertebral disc displacement, lumbar region: Secondary | ICD-10-CM | POA: Diagnosis not present

## 2018-04-27 DIAGNOSIS — M48062 Spinal stenosis, lumbar region with neurogenic claudication: Secondary | ICD-10-CM | POA: Diagnosis not present

## 2018-04-28 ENCOUNTER — Ambulatory Visit (INDEPENDENT_AMBULATORY_CARE_PROVIDER_SITE_OTHER): Payer: PPO | Admitting: Internal Medicine

## 2018-04-28 ENCOUNTER — Encounter: Payer: Self-pay | Admitting: Internal Medicine

## 2018-04-28 VITALS — BP 110/60 | HR 80 | Temp 98.3°F | Ht 63.0 in | Wt 127.0 lb

## 2018-04-28 DIAGNOSIS — E559 Vitamin D deficiency, unspecified: Secondary | ICD-10-CM

## 2018-04-28 DIAGNOSIS — E039 Hypothyroidism, unspecified: Secondary | ICD-10-CM | POA: Diagnosis not present

## 2018-04-28 DIAGNOSIS — Z5181 Encounter for therapeutic drug level monitoring: Secondary | ICD-10-CM | POA: Diagnosis not present

## 2018-04-28 DIAGNOSIS — Z79899 Other long term (current) drug therapy: Secondary | ICD-10-CM | POA: Diagnosis not present

## 2018-04-28 DIAGNOSIS — M810 Age-related osteoporosis without current pathological fracture: Secondary | ICD-10-CM | POA: Diagnosis not present

## 2018-04-28 DIAGNOSIS — R5383 Other fatigue: Secondary | ICD-10-CM

## 2018-04-28 DIAGNOSIS — M8580 Other specified disorders of bone density and structure, unspecified site: Secondary | ICD-10-CM | POA: Diagnosis not present

## 2018-04-28 NOTE — Progress Notes (Signed)
   Subjective:    Patient ID: Megan Williams, female    DOB: 06-13-1945, 73 y.o.   MRN: [id number]  HPI 73 year old Female in today for 49-month follow-up on hypothyroidism.  Feels well except for some back pain.  Dr. Ellene Route gave her some Celebrex yesterday.  Advil was not working.  In December 2019, she had lumbar decompression and stabilization and L3-L4.  Had previous fusion L4-L5.  TSH drawn today.  Dr. Layne Benton also request some lab work and that was drawn as well.  This included a vitamin D level.  It will be forwarded to Dr. Layne Benton.  She is on Prolia therapy for osteopenia.  Needs bone density study in the near future.  Last one was in 2018.  Lowest T score was -1.7.    Review of Systems no new complaints     Objective:   Physical Exam  No thyromegaly.  No carotid bruits.  Chest clear.  Cardiac exam regular rate and rhythm normal S1 and S2.  Vital signs reviewed Blood pressure 110/60, pulse 80, pulse oximetry 98%, weight 127 pounds.  She is afebrile.       Assessment & Plan:  Hypothyroidism-stable on thyroid replacement therapy.  TSH drawn today.  Osteopenia-labs drawn per Dr. Layne Benton including vitamin D level and will be sent to her.  Plan: Physical exam due in 6 months.  Bone density study due this year.  Status post lumbar decompression L3-L4 currently being treated with Celebrex for pain per Dr. Ellene Route.

## 2018-04-28 NOTE — Patient Instructions (Addendum)
Lab work drawn and pending.  Continue current medications and follow-up with physical exam in 6 months for CPE.

## 2018-04-29 LAB — COMPLETE METABOLIC PANEL WITH GFR
AG Ratio: 1.6 (calc) (ref 1.0–2.5)
ALT: 9 U/L (ref 6–29)
AST: 14 U/L (ref 10–35)
Albumin: 3.8 g/dL (ref 3.6–5.1)
Alkaline phosphatase (APISO): 58 U/L (ref 37–153)
BUN: 12 mg/dL (ref 7–25)
CO2: 26 mmol/L (ref 20–32)
Calcium: 9.3 mg/dL (ref 8.6–10.4)
Chloride: 106 mmol/L (ref 98–110)
Creat: 0.92 mg/dL (ref 0.60–0.93)
GFR, Est African American: 73 mL/min/{1.73_m2} (ref >=60)
GFR, Est Non African American: 63 mL/min/{1.73_m2} (ref >=60)
Globulin: 2.4 g/dL (calc) (ref 1.9–3.7)
Glucose, Bld: 109 mg/dL — ABNORMAL HIGH (ref 65–99)
Potassium: 4.5 mmol/L (ref 3.5–5.3)
Sodium: 139 mmol/L (ref 135–146)
Total Bilirubin: 0.2 mg/dL (ref 0.2–1.2)
Total Protein: 6.2 g/dL (ref 6.1–8.1)

## 2018-04-29 LAB — CBC WITH DIFFERENTIAL/PLATELET
Absolute Monocytes: 355 cells/uL (ref 200–950)
Basophils Absolute: 30 cells/uL (ref 0–200)
Basophils Relative: 0.6 %
Eosinophils Absolute: 0 cells/uL — ABNORMAL LOW (ref 15–500)
Eosinophils Relative: 0 %
HCT: 38.2 % (ref 35.0–45.0)
Hemoglobin: 12.8 g/dL (ref 11.7–15.5)
Lymphs Abs: 1705 cells/uL (ref 850–3900)
MCH: 32.6 pg (ref 27.0–33.0)
MCHC: 33.5 g/dL (ref 32.0–36.0)
MCV: 97.2 fL (ref 80.0–100.0)
MPV: 10 fL (ref 7.5–12.5)
Monocytes Relative: 7.1 %
Neutro Abs: 2910 cells/uL (ref 1500–7800)
Neutrophils Relative %: 58.2 %
Platelets: 225 10*3/uL (ref 140–400)
RBC: 3.93 10*6/uL (ref 3.80–5.10)
RDW: 12.7 % (ref 11.0–15.0)
Total Lymphocyte: 34.1 %
WBC: 5 10*3/uL (ref 3.8–10.8)

## 2018-04-29 LAB — VITAMIN D 25 HYDROXY (VIT D DEFICIENCY, FRACTURES): Vit D, 25-Hydroxy: 67 ng/mL (ref 30–100)

## 2018-04-29 LAB — TSH: TSH: 0.34 mIU/L — ABNORMAL LOW (ref 0.40–4.50)

## 2018-05-01 ENCOUNTER — Other Ambulatory Visit: Payer: Self-pay

## 2018-05-01 DIAGNOSIS — M48062 Spinal stenosis, lumbar region with neurogenic claudication: Secondary | ICD-10-CM | POA: Diagnosis not present

## 2018-05-01 DIAGNOSIS — M545 Low back pain: Secondary | ICD-10-CM | POA: Diagnosis not present

## 2018-05-01 MED ORDER — LEVOTHYROXINE SODIUM 88 MCG PO TABS: 88.0000 ug | ORAL_TABLET | Freq: Every day | ORAL | 1 refills | Status: DC

## 2018-05-02 ENCOUNTER — Other Ambulatory Visit: Payer: Self-pay | Admitting: Women's Health

## 2018-05-02 ENCOUNTER — Other Ambulatory Visit: Payer: Self-pay | Admitting: Internal Medicine

## 2018-05-02 DIAGNOSIS — Z1231 Encounter for screening mammogram for malignant neoplasm of breast: Secondary | ICD-10-CM

## 2018-05-02 NOTE — Telephone Encounter (Signed)
Spoke with patient and provided Lab appt for Monday, 4/6 to arrive any time between 9:00 - 12:00 p.m.  Patient does NOT have to fast for this lab.  Patient verbalized understanding of this conversation.

## 2018-05-03 DIAGNOSIS — M48062 Spinal stenosis, lumbar region with neurogenic claudication: Secondary | ICD-10-CM | POA: Diagnosis not present

## 2018-05-03 DIAGNOSIS — M545 Low back pain: Secondary | ICD-10-CM | POA: Diagnosis not present

## 2018-05-04 DIAGNOSIS — R351 Nocturia: Secondary | ICD-10-CM | POA: Diagnosis not present

## 2018-05-04 DIAGNOSIS — N3281 Overactive bladder: Secondary | ICD-10-CM | POA: Diagnosis not present

## 2018-05-08 ENCOUNTER — Telehealth: Payer: Self-pay | Admitting: Internal Medicine

## 2018-05-08 DIAGNOSIS — M48062 Spinal stenosis, lumbar region with neurogenic claudication: Secondary | ICD-10-CM | POA: Diagnosis not present

## 2018-05-08 DIAGNOSIS — M545 Low back pain: Secondary | ICD-10-CM | POA: Diagnosis not present

## 2018-05-08 NOTE — Telephone Encounter (Signed)
[place] Self [phone]  [place] called to see if she needs to come in and see you or go to Dr Marlane Mingle about having hand tremors so bad, she can hardly write. When she had her Lumbar Fusion they suggested she be seen about this.

## 2018-05-08 NOTE — Telephone Encounter (Signed)
She last saw Dr. [name] at [facility] May 23, 2017. She was seen for tremor and memory issues. Time to go back and get rechecked. She may call for appt.

## 2018-05-08 NOTE — Telephone Encounter (Signed)
Pt was notified of Dr. Baxley's instructions, pt verbalized understanding.   

## 2018-05-09 DIAGNOSIS — F411 Generalized anxiety disorder: Secondary | ICD-10-CM | POA: Diagnosis not present

## 2018-05-09 DIAGNOSIS — G3184 Mild cognitive impairment, so stated: Secondary | ICD-10-CM | POA: Diagnosis not present

## 2018-05-09 DIAGNOSIS — F331 Major depressive disorder, recurrent, moderate: Secondary | ICD-10-CM | POA: Diagnosis not present

## 2018-05-09 DIAGNOSIS — F429 Obsessive-compulsive disorder, unspecified: Secondary | ICD-10-CM | POA: Diagnosis not present

## 2018-05-10 ENCOUNTER — Ambulatory Visit: Payer: PPO | Admitting: Neurology

## 2018-05-10 ENCOUNTER — Other Ambulatory Visit: Payer: Self-pay

## 2018-05-10 ENCOUNTER — Encounter: Payer: Self-pay | Admitting: Neurology

## 2018-05-10 VITALS — BP 108/72 | HR 76 | Ht 63.0 in | Wt 127.2 lb

## 2018-05-10 DIAGNOSIS — F09 Unspecified mental disorder due to known physiological condition: Secondary | ICD-10-CM

## 2018-05-10 DIAGNOSIS — R251 Tremor, unspecified: Secondary | ICD-10-CM | POA: Diagnosis not present

## 2018-05-10 MED ORDER — MEMANTINE HCL 10 MG PO TABS: 10.0000 mg | ORAL_TABLET | Freq: Two times a day (BID) | ORAL | 4 refills | Status: DC

## 2018-05-10 NOTE — Progress Notes (Signed)
PATIENT: Megan Williams DOB: [birth year]  REASON FOR VISIT: follow up HISTORY FROM: patient  HISTORY OF PRESENT ILLNESS: Today 05/10/18  HISTORY  HISTORY Megan Williams): Megan Williams 73 years old right-handed female, seen in refer by her primary care physician Dr. Tedra Senegal for evaluation of tremor, memory loss, and gait problems   She had a history of hypertension, depression, anxiety, used to work as a Radio broadcast assistant, and church Glass blower/designer  Around 2015, she began to notice memory trouble, she needs family to remind her multiple times, tends to forget people's name, phone number, she used to be able to remember all the congregation's name in the past, her memory trouble since 4 gradually getting worse, she still driving without getting lost  She reported history of migraine since young, for a while in September to October 2015, she has migraines almost on a daily basis, which has improved after stopped taking Trileptal  She also reported mild bilateral hands tremor since 2014, most noticeable when she holding a utensil, or write with a pencil, she also noticed mild bilateral hands weakness, in Thanksgiving 2015, she has dropped her dishes to the floor, because of bilateral hands weakness,  Around 2015, she also noticed mild stiff unsteady gait, worsening urinary urgency, she denies significant neck pain, complains of moderate low back pain, she denies bilateral upper or lower extremity paresthesia  She is adopted, does not know family history, none of her children has tremor  MRI brain film in November 2016, mild generalized atrophy, mild supratentorium small vessel disease,   MRI of the cervical spine showed evidence of previous fusion from C4-7, 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   UPDATE October 27 2016: We reviewed the laboratory evaluation in August 2018, A1c was 4.9, normal folic acid, vitamin P37, TSH, fasting lipid profile, triglycerides  198, LDL 85, CMP showed creatinine of 1.0, CBC showed MCV of 105,  She has retired, continue combating depression, not memory loss, especially when she is stressed, she is on polypharmacy treatment, Effexor, Wellbutrin, Xanax as needed trazodone as needed for sleep  She also has bilateral hands tremor, mild gait abnormality due to low back pain  Update May 23, 2017: She still noticed mild memory loss, but overall doing well. She is now retired, Tour manager, [place] Status Examination was 30 out of 30 today, still on polypharmacy treatment, trazodone, Effexor, Xanax, Wellbutrin  Update May 10, 2018 SS: Her last MMSE in March 2019 was 30/30.  She has history of tremor, thought to be essential tremor, taking Xanax as needed. Tremor to both hands, affecting her handwriting. She is currently taking Namenda 10 mg twice a day for memory. She reports that her memory comes and goes.  She reports she has trouble finding the right words to say and sometimes it takes her longer time to complete her sentences.  She gets frustrated when her husband will finish her sentences.  Sometimes her words get jumbled together.  She drives a car without difficulty.  She is able to complete her own ADLs.  She reports her memory has not stopped her from doing any of her normal things.  She enjoys spending time with her 4 dogs.  She is in physical therapy for her chronic back issues.  She is hoping to be more active when she finishes physical therapy, start swimming this summer.  She continues to complain of a tremor in both of her hands that she  thinks has gotten worse over time.  She reports difficulty writing.  She denies any problems eating or feeding herself.  She reports tremor is worse whenever she is nervous or upset.  She will take Xanax as needed for her tremor.  She does report some trouble with her balance, feeling off balance.  She denies any falls.  Sometimes whenever she wakes up in the morning she  feels like she leans to the right.  She is currently being treated for depression and Lamictal was recently added to her medications 25 mg at bedtime.  She is also taking Wellbutrin, Effexor, trazodone.   REVIEW OF SYSTEMS: Out of a complete 14 system review of symptoms, the patient complains only of the following symptoms, and all other reviewed systems are negative.  Eye itching, runny nose, urgency, environmental allergies, joint swelling, back pain, walking difficulty, memory loss, tremors, depression, nervous/anxious  ALLERGIES: Allergies  Allergen Reactions   Doxycycline Other (See Comments)    Skin peeling    Penicillins Hives    Has patient had a PCN reaction causing immediate rash, facial/tongue/throat swelling, SOB or lightheadedness with hypotension: No Has patient had a PCN reaction causing severe rash involving mucus membranes or skin necrosis: No Has patient had a PCN reaction that required hospitalization: No Has patient had a PCN reaction occurring within the last 10 years: No If all of the above answers are "NO", then may proceed with Cephalosporin use.   Latex Rash    HOME MEDICATIONS: Outpatient Medications Prior to Visit  Medication Sig Dispense Refill   ALPRAZolam (XANAX) 1 MG tablet Take 0.5-1 mg by mouth 2 (two) times daily as needed for anxiety.      Artificial Tear Ointment (REFRESH LACRI-LUBE) OINT Place 1 drop into both eyes at bedtime.     buPROPion (WELLBUTRIN XL) 300 MG 24 hr tablet Take 300 mg by mouth daily.      Calcium-Vitamin D-Vitamin K (VIACTIV CALCIUM PLUS D PO) Take 2 tablets by mouth daily.     carboxymethylcellulose (REFRESH) 1 % ophthalmic solution Place 1 drop into both eyes 4 (four) times daily.     Cholecalciferol (VITAMIN D) 50 MCG (2000 UT) tablet Take 2,000 Units by mouth daily.     Cyanocobalamin (B-12) 1000 MCG SUBL Place 1,000 mcg under the tongue daily.     denosumab (PROLIA) 60 MG/ML SOLN injection Inject 60 mg into the  skin every 6 (six) months. Administer in upper arm, thigh, or abdomen     diazepam (VALIUM) 5 MG tablet Take 5 mg by mouth every 6 (six) hours as needed for muscle spasms.     diphenhydrAMINE (BENADRYL) 25 MG tablet Take 25-50 mg by mouth daily as needed (hives).      estradiol (ESTRACE) 0.5 MG tablet Take 2 tablets (1 mg total) by mouth daily. 180 tablet 4   fexofenadine (ALLEGRA) 60 MG tablet Take 60 mg by mouth daily.     levothyroxine (SYNTHROID, LEVOTHROID) 88 MCG tablet Take 1 tablet (88 mcg total) by mouth daily. 30 tablet 1   Magnesium Oxide (HM MAGNESIUM) 250 MG TABS Take 250 mg by mouth daily.      memantine (NAMENDA) 10 MG tablet Take 1 tablet (10 mg total) by mouth 2 (two) times daily. TAKE 10 mg TABLET BY MOUTH 2 TIMES DAILY. (Patient taking differently: Take 10 mg by mouth 2 (two) times daily. ) 180 tablet 4   methocarbamol (ROBAXIN) 500 MG tablet Take 1 tablet (500 mg total) by mouth  every 6 (six) hours as needed for muscle spasms. 40 tablet 3   Multiple Vitamin (MULTIVITAMIN) tablet Take 2 tablets by mouth daily.      naproxen sodium (ALEVE) 220 MG tablet Take 440 mg by mouth daily as needed (pain).     pantoprazole (PROTONIX) 40 MG tablet Take 1 tablet (40 mg total) by mouth daily. (Patient taking differently: Take 20 mg by mouth daily as needed (acid reflux). ) 90 tablet 3   sodium chloride (OCEAN) 0.65 % SOLN nasal spray Place 1 spray into both nostrils as needed for congestion.     traZODone (DESYREL) 100 MG tablet Take 150-200 mg by mouth at bedtime.      valACYclovir (VALTREX) 500 MG tablet TAKE 1 TABLET BY MOUTH TWICE DAILY FOR 3 TO 5 DAYS. 30 tablet 12   venlafaxine XR (EFFEXOR-XR) 150 MG 24 hr capsule Take 300 mg by mouth daily with breakfast.      vitamin k 100 MCG tablet Take 100 mcg by mouth daily.     No facility-administered medications prior to visit.     PAST MEDICAL HISTORY: Past Medical History:  Diagnosis Date   Allergy    Anxiety     Arthritis    Complication of anesthesia    "difficulty waking up. I could hear them but I couldn't wake up"   COPD (chronic obstructive pulmonary disease) (HCC)    Depression    Emphysema    Esophagitis    Herpes simplex    Hypertension    Migraines    Osteopenia    Vitamin D deficiency     PAST SURGICAL HISTORY: Past Surgical History:  Procedure Laterality Date   BACK SURGERY     BREAST EXCISIONAL BIOPSY Left 1979   Benign    CATARACT EXTRACTION, BILATERAL     CERVICAL FUSION     CHOLECYSTECTOMY     KYPHOPLASTY  09/19/2015   Dr. Ellene Route  T12   Thumb surg     THYROIDECTOMY     TONSILLECTOMY     VAGINAL HYSTERECTOMY      FAMILY HISTORY: Family History  Adopted: Yes  Family history unknown: Yes    SOCIAL HISTORY: Social History   Socioeconomic History   Marital status: Married    Spouse name: Not on file   Number of children: 2   Years of education: 14   Highest education level: Not on file  Occupational History   Occupation: Retired  Scientist, product/process development strain: Not on Training and development officer insecurity:    Worry: Not on file    Inability: Not on Lexicographer needs:    Medical: Not on file    Non-medical: Not on file  Tobacco Use   Smoking status: Current Every Day Smoker   Smokeless tobacco: Never Used   Tobacco comment: 3  cigarettes daily  Substance and Sexual Activity   Alcohol use: Yes    Alcohol/week: 1.0 standard drinks    Types: 1 Glasses of wine per week    Comment: dinner or bed    Drug use: No   Sexual activity: Not Currently    Birth control/protection: Surgical    Comment: INTERCOURSE AGE 72, SEXUAL PARTNERS LESS THAN 5  Lifestyle   Physical activity:    Days per week: Not on file    Minutes per session: Not on file   Stress: Not on file  Relationships   Social connections:    Talks on phone: Not  on file    Gets together: Not on file    Attends religious service: Not on file     Active member of club or organization: Not on file    Attends meetings of clubs or organizations: Not on file    Relationship status: Not on file   Intimate partner violence:    Fear of current or ex partner: Not on file    Emotionally abused: Not on file    Physically abused: Not on file    Forced sexual activity: Not on file  Other Topics Concern   Not on file  Social History Narrative   Lives at home with husband.   Right-handed.   1 cup caffeine daily.      PHYSICAL EXAM  There were no vitals filed for this visit. There is no height or weight on file to calculate BMI.  Generalized: Well developed, in no acute distress  MMSE - Mini Mental State Exam 05/10/2018 05/23/2017 10/27/2016  Orientation to time 5 5 5   Orientation to Place 5 5 5   Registration 3 3 3   Attention/ Calculation 5 5 5   Recall 3 3 2   Language- name 2 objects 2 2 2   Language- repeat 1 1 1   Language- follow 3 step command 3 3 3   Language- read & follow direction 1 1 1   Write a sentence 1 1 1   Copy design 1 1 1   Total score 30 30 29    Neurological examination  Mentation: Alert oriented to time, place, history taking. Follows all commands speech and language fluent Cranial nerve II-XII: Pupils were equal round reactive to light. Extraocular movements were full, visual field were full on confrontational test. Facial sensation and strength were normal. Uvula tongue midline. Head turning and shoulder shrug  were normal and symmetric. Motor: The motor testing reveals 5 over 5 strength of all 4 extremities. Good symmetric motor tone is noted throughout.  Mild action tremor to both hands. Sensory: Sensory testing is intact to soft touch on all 4 extremities. No evidence of extinction is noted.  Coordination: Cerebellar testing reveals good finger-nose-finger and heel-to-shin bilaterally.  Gait and station: Gait is normal. Tandem gait is Unsteady. Romberg is negative. No drift is seen.  Reflexes: Deep tendon  reflexes are symmetric and normal bilaterally.   DIAGNOSTIC DATA (LABS, IMAGING, TESTING) - I reviewed patient records, labs, notes, testing and imaging myself where available.  Lab Results  Component Value Date   WBC 5.0 04/28/2018   HGB 12.8 04/28/2018   HCT 38.2 04/28/2018   MCV 97.2 04/28/2018   PLT 225 04/28/2018      Component Value Date/Time   NA 139 04/28/2018 1209   K 4.5 04/28/2018 1209   CL 106 04/28/2018 1209   CO2 26 04/28/2018 1209   GLUCOSE 109 (H) 04/28/2018 1209   BUN 12 04/28/2018 1209   CREATININE 0.92 04/28/2018 1209   CALCIUM 9.3 04/28/2018 1209   PROT 6.2 04/28/2018 1209   ALBUMIN 4.0 10/07/2016 0925   AST 14 04/28/2018 1209   ALT 9 04/28/2018 1209   ALKPHOS 49 10/07/2016 0925   BILITOT 0.2 04/28/2018 1209   GFRNONAA 63 04/28/2018 1209   GFRAA 73 04/28/2018 1209   Lab Results  Component Value Date   CHOL 228 (H) 10/10/2017   HDL 89 10/10/2017   LDLCALC 111 (H) 10/10/2017   TRIG 157 (H) 10/10/2017   CHOLHDL 2.6 10/10/2017   Lab Results  Component Value Date   HGBA1C 4.9 10/07/2016  Lab Results  Component Value Date   VITAMINB12 750 10/07/2016   Lab Results  Component Value Date   TSH 0.34 (L) 04/28/2018      ASSESSMENT AND PLAN 73 y.o. year old female  has a past medical history of Allergy, Anxiety, Arthritis, Complication of anesthesia, COPD (chronic obstructive pulmonary disease) ([place]), Depression, Emphysema, Esophagitis, Herpes simplex, Hypertension, Migraines, Osteopenia, and Vitamin D deficiency. here with:  1.  Memory disorder 2.  Essential tremor  She will continue taking Namenda 10 mg twice daily.  Her MMSE is 30/30 and is unchanged from prior.  She has a mild action tremor to bilateral hands.  This appears to be worse at times of stress or anxiety.  We discussed possibly starting low-dose propanolol for the tremor however she deferred at this time.  At this time she is not exhibiting any parkinsonian features she will  follow-up in 6 months or sooner if needed.  I advised her to be as active as possible. She is on polypharmacy treatment for depression/anxiety including Wellbutrin, Lamictal, Effexor, trazodone at bedtime. She will start doing moderate exercise as tolerated.  I advised that if her symptoms worsen or she develops any new symptoms she should let us know.   I spent 15 minutes with the patient. 50% of this time was spent discussing her plan of care.   Butler Denmark, AGNP-C, DNP 05/10/2018, 10:12 AM [facility] [address], [place], [place] [address] [phone]

## 2018-05-10 NOTE — Progress Notes (Signed)
Neurology says pt taking lamictal wellbutrin,effexor and trazadone. These are not all listed in med list. Please call her and see who is her psychiatrist prescribing these and send neuro eval to them. Please update med list- may have to call her pharmacy  To get correct info.

## 2018-05-11 DIAGNOSIS — M545 Low back pain: Secondary | ICD-10-CM | POA: Diagnosis not present

## 2018-05-11 DIAGNOSIS — M48062 Spinal stenosis, lumbar region with neurogenic claudication: Secondary | ICD-10-CM | POA: Diagnosis not present

## 2018-05-11 NOTE — Progress Notes (Signed)
Verified medications with patient. She see Noemi Chapel at [facility]. Will fax Neuro Eval.

## 2018-05-11 NOTE — Telephone Encounter (Signed)
-----   Message from Elby Showers, MD sent at 05/10/2018  4:41 PM EDT -----   ----- Message ----- From: Suzzanne Cloud, NP Sent: 05/10/2018   4:15 PM EDT To: Elby Showers, MD

## 2018-05-15 DIAGNOSIS — M48062 Spinal stenosis, lumbar region with neurogenic claudication: Secondary | ICD-10-CM | POA: Diagnosis not present

## 2018-05-15 DIAGNOSIS — M545 Low back pain: Secondary | ICD-10-CM | POA: Diagnosis not present

## 2018-05-16 NOTE — Progress Notes (Signed)
I have reviewed and agreed above plan. 

## 2018-05-18 DIAGNOSIS — M48062 Spinal stenosis, lumbar region with neurogenic claudication: Secondary | ICD-10-CM | POA: Diagnosis not present

## 2018-05-18 DIAGNOSIS — M545 Low back pain: Secondary | ICD-10-CM | POA: Diagnosis not present

## 2018-05-22 DIAGNOSIS — M48062 Spinal stenosis, lumbar region with neurogenic claudication: Secondary | ICD-10-CM | POA: Diagnosis not present

## 2018-05-22 DIAGNOSIS — M545 Low back pain: Secondary | ICD-10-CM | POA: Diagnosis not present

## 2018-05-25 DIAGNOSIS — M48062 Spinal stenosis, lumbar region with neurogenic claudication: Secondary | ICD-10-CM | POA: Diagnosis not present

## 2018-05-25 DIAGNOSIS — M545 Low back pain: Secondary | ICD-10-CM | POA: Diagnosis not present

## 2018-05-31 ENCOUNTER — Ambulatory Visit: Payer: PPO

## 2018-06-05 ENCOUNTER — Other Ambulatory Visit: Payer: Self-pay

## 2018-06-05 ENCOUNTER — Other Ambulatory Visit: Payer: PPO | Admitting: Internal Medicine

## 2018-06-05 VITALS — Temp 98.3°F

## 2018-06-05 DIAGNOSIS — F419 Anxiety disorder, unspecified: Secondary | ICD-10-CM | POA: Diagnosis not present

## 2018-06-05 DIAGNOSIS — F329 Major depressive disorder, single episode, unspecified: Secondary | ICD-10-CM | POA: Diagnosis not present

## 2018-06-05 DIAGNOSIS — E039 Hypothyroidism, unspecified: Secondary | ICD-10-CM | POA: Diagnosis not present

## 2018-06-05 DIAGNOSIS — R7989 Other specified abnormal findings of blood chemistry: Secondary | ICD-10-CM | POA: Diagnosis not present

## 2018-06-05 DIAGNOSIS — N189 Chronic kidney disease, unspecified: Secondary | ICD-10-CM | POA: Diagnosis not present

## 2018-06-05 DIAGNOSIS — D631 Anemia in chronic kidney disease: Secondary | ICD-10-CM | POA: Diagnosis not present

## 2018-06-05 DIAGNOSIS — I10 Essential (primary) hypertension: Secondary | ICD-10-CM | POA: Diagnosis not present

## 2018-06-05 LAB — TSH: TSH: 7.08 mIU/L — ABNORMAL HIGH (ref 0.40–4.50)

## 2018-06-06 ENCOUNTER — Encounter: Payer: Self-pay | Admitting: Internal Medicine

## 2018-06-06 ENCOUNTER — Telehealth: Payer: Self-pay | Admitting: Internal Medicine

## 2018-06-06 DIAGNOSIS — E039 Hypothyroidism, unspecified: Secondary | ICD-10-CM

## 2018-06-06 MED ORDER — LEVOTHYROXINE SODIUM 100 MCG PO TABS: 100.0000 ug | ORAL_TABLET | Freq: Every day | ORAL | 0 refills | Status: DC

## 2018-06-06 NOTE — Telephone Encounter (Signed)
rx was sent, pt was notified to switch to levothyroxine 154mcg.

## 2018-06-06 NOTE — Telephone Encounter (Addendum)
Patient called states she has not missed a dose of levothyroxine 54mcg  states she tries to take it every morning on an empty stomach and an hour after she takes the rest of her meds and then she eats breakfast.   Go back to Levothyroxine o.1 mg daily and get another TSH in 6 weeks without OV. Please call her and send in this RX

## 2018-06-06 NOTE — Addendum Note (Signed)
Addended by: Mady Haagensen on: 06/06/2018 12:47 PM   Modules accepted: Orders

## 2018-06-14 DIAGNOSIS — R270 Ataxia, unspecified: Secondary | ICD-10-CM | POA: Diagnosis not present

## 2018-06-14 DIAGNOSIS — M542 Cervicalgia: Secondary | ICD-10-CM | POA: Diagnosis not present

## 2018-06-14 DIAGNOSIS — M5416 Radiculopathy, lumbar region: Secondary | ICD-10-CM | POA: Diagnosis not present

## 2018-06-19 ENCOUNTER — Encounter: Payer: PPO | Admitting: Women's Health

## 2018-07-13 DIAGNOSIS — M545 Low back pain: Secondary | ICD-10-CM | POA: Diagnosis not present

## 2018-07-13 DIAGNOSIS — M810 Age-related osteoporosis without current pathological fracture: Secondary | ICD-10-CM | POA: Diagnosis not present

## 2018-07-17 ENCOUNTER — Telehealth: Payer: Self-pay | Admitting: Internal Medicine

## 2018-07-17 NOTE — Telephone Encounter (Signed)
She is due for one, will give it to her in the morning.

## 2018-07-17 NOTE — Telephone Encounter (Signed)
Megan Williams [phone]  Megan Williams called to see if she needs to be seen or a tetanus shot. She took a nasty fall on Sunday while walking her little dog, she fell face down on the asphalt. Her face and nose are scraped up and bruised, her lip is cut, right knee hurts and is bruised, her shoulder down the muscles are very sore, she stated that her tetanus was due in February. She is coming in the morning for her labs and has virtual visit on Thursday

## 2018-07-17 NOTE — Telephone Encounter (Signed)
Check tetanus status

## 2018-07-18 ENCOUNTER — Other Ambulatory Visit: Payer: PPO | Admitting: Internal Medicine

## 2018-07-18 ENCOUNTER — Other Ambulatory Visit: Payer: Self-pay

## 2018-07-18 ENCOUNTER — Ambulatory Visit
Admission: RE | Admit: 2018-07-18 | Discharge: 2018-07-18 | Disposition: A | Discharge status: Home / Self Care | Payer: PPO | Source: Ambulatory Visit | Attending: Internal Medicine | Admitting: Internal Medicine

## 2018-07-18 ENCOUNTER — Encounter: Payer: Self-pay | Admitting: Internal Medicine

## 2018-07-18 ENCOUNTER — Ambulatory Visit (INDEPENDENT_AMBULATORY_CARE_PROVIDER_SITE_OTHER): Payer: PPO | Admitting: Internal Medicine

## 2018-07-18 DIAGNOSIS — W19XXXA Unspecified fall, initial encounter: Secondary | ICD-10-CM | POA: Diagnosis not present

## 2018-07-18 DIAGNOSIS — S0033XA Contusion of nose, initial encounter: Secondary | ICD-10-CM

## 2018-07-18 DIAGNOSIS — E039 Hypothyroidism, unspecified: Secondary | ICD-10-CM | POA: Diagnosis not present

## 2018-07-18 DIAGNOSIS — S0992XA Unspecified injury of nose, initial encounter: Secondary | ICD-10-CM | POA: Diagnosis not present

## 2018-07-18 DIAGNOSIS — Z23 Encounter for immunization: Secondary | ICD-10-CM | POA: Diagnosis not present

## 2018-07-18 NOTE — Progress Notes (Signed)
   Subjective:    Patient ID: Megan Williams, female    DOB: 1945-05-02, 73 y.o.   MRN: [id number]  HPI  Pt had a fall while walking her dog on Sunday May 17. She fell face down on asphalt. She called about a Tdap update. She came today for that. Was noted to have swollen nose and facial bruising. Was alert. Being sent for nasal films to rule out nasal fracture.  Also here today for labs as previously ordered.  Review of Systems see above no LOC with fall     Objective:   Physical Exam Seen briefly today after blood draw. Alert, oriented ambulatory without gait disturbance. Extraocular movements are full.      Assessment & Plan:  Fall  Nasal contusion vs fracture  Plan: Tdap update given. To have nasal films now.

## 2018-07-18 NOTE — Patient Instructions (Addendum)
Tdap given and to have nasal films now

## 2018-07-19 ENCOUNTER — Other Ambulatory Visit: Payer: Self-pay | Admitting: Sports Medicine

## 2018-07-19 ENCOUNTER — Encounter: Payer: Self-pay | Admitting: Women's Health

## 2018-07-19 ENCOUNTER — Ambulatory Visit (INDEPENDENT_AMBULATORY_CARE_PROVIDER_SITE_OTHER): Payer: PPO | Admitting: Women's Health

## 2018-07-19 VITALS — BP 120/78 | Ht 63.0 in | Wt 125.0 lb

## 2018-07-19 DIAGNOSIS — Z9289 Personal history of other medical treatment: Secondary | ICD-10-CM | POA: Diagnosis not present

## 2018-07-19 DIAGNOSIS — Z01419 Encounter for gynecological examination (general) (routine) without abnormal findings: Secondary | ICD-10-CM | POA: Diagnosis not present

## 2018-07-19 DIAGNOSIS — M810 Age-related osteoporosis without current pathological fracture: Secondary | ICD-10-CM

## 2018-07-19 DIAGNOSIS — Z79890 Hormone replacement therapy (postmenopausal): Secondary | ICD-10-CM

## 2018-07-19 LAB — TSH: TSH: 0.88 mIU/L (ref 0.40–4.50)

## 2018-07-19 MED ORDER — ESTRADIOL 1 MG PO TABS: 1.0000 mg | ORAL_TABLET | Freq: Every day | ORAL | 4 refills | Status: DC

## 2018-07-19 NOTE — Progress Notes (Signed)
Megan Williams Jul 21, 1945 [id number]    History:    Presents for breast and pelvic exam.  1979 TVH for an abnormal Pap with normal Paps for years after.  Normal mammogram history scheduled next week.  Continues on estradiol  1 mg unable to tolerate 0.5 states had numerous hot flushes, poor sleep and irritability.  2018 benign colon polyp 10-year follow-up.  2018 T score -1.7 at femoral neck at primary care stable on Prolia.  Primary care manages hypertension, COPD, asthma, hypothyroidism.  Had a traumatic fall yesterday while walking her Boston terrier hit her face, no fractures.  Back fusion 12/ 2019  Past medical history, past surgical history, family history and social history were all reviewed and documented in the EPIC chart.  Has 2 Boston terriers.  2 children both live locally.  Retired Radio broadcast assistant.  ROS:  A ROS was performed and pertinent positives and negatives are included.  Exam:  Vitals:   07/19/18 1117  BP: 120/78  Weight: 125 lb (56.7 kg)  Height: 5\' 3"  (1.6 m)   Body mass index is 22.14 kg/m.   General appearance:  Normal Thyroid:  Symmetrical, normal in size, without palpable masses or nodularity. Respiratory  Auscultation:  Clear without wheezing or rhonchi Cardiovascular  Auscultation:  Regular rate, without rubs, murmurs or gallops  Edema/varicosities:  Not grossly evident Abdominal  Soft,nontender, without masses, guarding or rebound.  Liver/spleen:  No organomegaly noted  Hernia:  None appreciated  Skin  Inspection:  Grossly normal   Breasts: Examined lying and sitting.     Right: Without masses, retractions, discharge or axillary adenopathy.     Left: Without masses, retractions, discharge or axillary adenopathy. Gentitourinary   Inguinal/mons:  Normal without inguinal adenopathy  External genitalia:  Normal  BUS/Urethra/Skene's glands:  Normal  Vagina:  Normal  Cervix: And uterus absent adnexa/parametria:     Rt: Without masses or  tenderness.   Lt: Without masses or tenderness.  Anus and perineum: Normal  Digital rectal exam: Normal sphincter tone without palpated masses or tenderness  Assessment/Plan:  73 y.o. [name] G2, P2 for breast and pelvic exam with no complaints.  1979 TVH on estradiol Hypothyroid, hypertension, COPD-primary care manages labs and meds Osteopenia on Prolia per primary care  Plan: HRT reviewed risks of blood clots, strokes and breast cancer, states unable to tolerate life without HRT due to numerous hot flushes, poor sleep and irritability.  Estradiol 1 mg p.o. daily encouraged to half tablet if able to tolerate.  Home safety, fall prevention and importance of weightbearing and balance type exercise encouraged.  Had traumatic fall yesterday on asphalt with no fractures.  SBEs, continue annual 3D screening mammogram, calcium rich foods, vitamin D 2000 daily encouraged.    Huel Cote [facility], 2:20 PM 07/19/2018

## 2018-07-19 NOTE — Patient Instructions (Signed)
Health Maintenance After Age 71 After age 71, you are at a higher risk for certain long-term diseases and infections as well as injuries from falls. Falls are a major cause of broken bones and head injuries in people who are older than age 71. Getting regular preventive care can help to keep you healthy and well. Preventive care includes getting regular testing and making lifestyle changes as recommended by your health care provider. Talk with your health care provider about:  Which screenings and tests you should have. A screening is a test that checks for a disease when you have no symptoms.  A diet and exercise plan that is right for you. What should I know about screenings and tests to prevent falls? Screening and testing are the best ways to find a health problem early. Early diagnosis and treatment give you the best chance of managing medical conditions that are common after age 71. Certain conditions and lifestyle choices may make you more likely to have a fall. Your health care provider may recommend:  Regular vision checks. Poor vision and conditions such as cataracts can make you more likely to have a fall. If you wear glasses, make sure to get your prescription updated if your vision changes.  Medicine review. Work with your health care provider to regularly review all of the medicines you are taking, including over-the-counter medicines. Ask your health care provider about any side effects that may make you more likely to have a fall. Tell your health care provider if any medicines that you take make you feel dizzy or sleepy.  Osteoporosis screening. Osteoporosis is a condition that causes the bones to get weaker. This can make the bones weak and cause them to break more easily.  Blood pressure screening. Blood pressure changes and medicines to control blood pressure can make you feel dizzy.  Strength and balance checks. Your health care provider may recommend certain tests to check your  strength and balance while standing, walking, or changing positions.  Foot health exam. Foot pain and numbness, as well as not wearing proper footwear, can make you more likely to have a fall.  Depression screening. You may be more likely to have a fall if you have a fear of falling, feel emotionally low, or feel unable to do activities that you used to do.  Alcohol use screening. Using too much alcohol can affect your balance and may make you more likely to have a fall. What actions can I take to lower my risk of falls? General instructions  Talk with your health care provider about your risks for falling. Tell your health care provider if: ? You fall. Be sure to tell your health care provider about all falls, even ones that seem minor. ? You feel dizzy, sleepy, or off-balance.  Take over-the-counter and prescription medicines only as told by your health care provider. These include any supplements.  Eat a healthy diet and maintain a healthy weight. A healthy diet includes low-fat dairy products, low-fat (lean) meats, and fiber from whole grains, beans, and lots of fruits and vegetables. Home safety  Remove any tripping hazards, such as rugs, cords, and clutter.  Install safety equipment such as grab bars in bathrooms and safety rails on stairs.  Keep rooms and walkways well-lit. Activity   Follow a regular exercise program to stay fit. This will help you maintain your balance. Ask your health care provider what types of exercise are appropriate for you.  If you need a cane or   walker, use it as recommended by your health care provider.  Wear supportive shoes that have nonskid soles. Lifestyle  Do not drink alcohol if your health care provider tells you not to drink.  If you drink alcohol, limit how much you have: ? 0-1 drink a day for women. ? 0-2 drinks a day for men.  Be aware of how much alcohol is in your drink. In the [place] one drink equals one typical bottle of beer (12  oz), one-half glass of wine (5 oz), or one shot of hard liquor (1 oz).  Do not use any products that contain nicotine or tobacco, such as cigarettes and e-cigarettes. If you need help quitting, ask your health care provider. Summary  Having a healthy lifestyle and getting preventive care can help to protect your health and wellness after age 71.  Screening and testing are the best way to find a health problem early and help you avoid having a fall. Early diagnosis and treatment give you the best chance for managing medical conditions that are more common for people who are older than age 71.  Falls are a major cause of broken bones and head injuries in people who are older than age 71. Take precautions to prevent a fall at home.  Work with your health care provider to learn what changes you can make to improve your health and wellness and to prevent falls. This information is not intended to replace advice given to you by your health care provider. Make sure you discuss any questions you have with your health care provider. Document Released: 12/29/2016 Document Revised: 12/29/2016 Document Reviewed: 12/29/2016 Elsevier Interactive Patient Education  2019 [employer]  

## 2018-07-20 ENCOUNTER — Ambulatory Visit
Admission: RE | Admit: 2018-07-20 | Discharge: 2018-07-20 | Disposition: A | Discharge status: Home / Self Care | Payer: PPO | Source: Ambulatory Visit | Attending: Women's Health | Admitting: Women's Health

## 2018-07-20 ENCOUNTER — Ambulatory Visit (INDEPENDENT_AMBULATORY_CARE_PROVIDER_SITE_OTHER): Payer: PPO | Admitting: Internal Medicine

## 2018-07-20 ENCOUNTER — Other Ambulatory Visit: Payer: Self-pay

## 2018-07-20 DIAGNOSIS — E039 Hypothyroidism, unspecified: Secondary | ICD-10-CM | POA: Diagnosis not present

## 2018-07-20 DIAGNOSIS — S0033XD Contusion of nose, subsequent encounter: Secondary | ICD-10-CM | POA: Diagnosis not present

## 2018-07-20 DIAGNOSIS — W19XXXD Unspecified fall, subsequent encounter: Secondary | ICD-10-CM | POA: Diagnosis not present

## 2018-07-20 DIAGNOSIS — Z1231 Encounter for screening mammogram for malignant neoplasm of breast: Secondary | ICD-10-CM

## 2018-07-21 LAB — URINALYSIS, COMPLETE W/RFL CULTURE
Bilirubin Urine: NEGATIVE
Glucose, UA: NEGATIVE
Hgb urine dipstick: NEGATIVE
Hyaline Cast: NONE SEEN /LPF
Ketones, ur: NEGATIVE
Leukocyte Esterase: NEGATIVE
Nitrites, Initial: NEGATIVE
Protein, ur: NEGATIVE
RBC / HPF: NONE SEEN /HPF (ref 0–2)
Specific Gravity, Urine: 1.016 (ref 1.001–1.03)
pH: <=5 (ref 5.0–8.0)

## 2018-07-21 LAB — URINE CULTURE
MICRO NUMBER:: 495852
Result:: NO GROWTH
SPECIMEN QUALITY:: ADEQUATE

## 2018-07-21 LAB — CULTURE INDICATED

## 2018-07-27 DIAGNOSIS — F3341 Major depressive disorder, recurrent, in partial remission: Secondary | ICD-10-CM | POA: Diagnosis not present

## 2018-07-27 DIAGNOSIS — F411 Generalized anxiety disorder: Secondary | ICD-10-CM | POA: Diagnosis not present

## 2018-07-27 DIAGNOSIS — F429 Obsessive-compulsive disorder, unspecified: Secondary | ICD-10-CM | POA: Diagnosis not present

## 2018-07-29 ENCOUNTER — Encounter: Payer: Self-pay | Admitting: Internal Medicine

## 2018-07-29 NOTE — Progress Notes (Signed)
   Subjective:    Patient ID: Megan Williams, female    DOB: [birth year], 73 y.o.   MRN: [id number]  HPI patient was seen briefly in the office on May 19 when she came for lab work to follow-up on hypothyroidism and abnormal TSH.  A Tdap vaccine update was given as she suffered a fall while walking her dog on Sunday, May 17.  She fell face down on asphalt.  She was noted to have swollen nose and facial bruising but was alert.  Nasal film negative for fracture despite having bruising over proximal aspect of her nose with swelling    Review of Systems says she feels better on levothyroxine 100 mcg daily     Objective:   Physical Exam Still with nasal swelling.  TSH is now normal on 100 mcg daily.  No other symptoms from fall other than some generalized soreness       Assessment & Plan:  Fall while walking her dog  Nasal contusion  Hypothyroidism with elevated TSH noted in April on levothyroxine 88 mcg daily and was changed to 100 mcg daily with now normal TSH  History of memory disturbance  Plan: Will be seen in follow-up for annual physical exam and Medicare wellness visit August 2020

## 2018-07-29 NOTE — Patient Instructions (Signed)
Apply ice to the nasal area.  Continue levothyroxine 100 mcg daily and follow-up in August.  Tetanus immunization update was given earlier in the office this week.

## 2018-08-04 ENCOUNTER — Telehealth: Payer: Self-pay

## 2018-08-04 NOTE — Telephone Encounter (Signed)
Please refer her to hearing specialist we found at [facility]

## 2018-08-04 NOTE — Telephone Encounter (Signed)
Patient states she is having difficult hearing, she said can not hear this starting going on for about 3 and she would like to be referred to a specialist.   Call back [phone]

## 2018-08-07 DIAGNOSIS — N3281 Overactive bladder: Secondary | ICD-10-CM | POA: Diagnosis not present

## 2018-08-08 NOTE — Telephone Encounter (Signed)
Left detailed message.   

## 2018-08-16 DIAGNOSIS — M48062 Spinal stenosis, lumbar region with neurogenic claudication: Secondary | ICD-10-CM | POA: Diagnosis not present

## 2018-08-16 DIAGNOSIS — M5416 Radiculopathy, lumbar region: Secondary | ICD-10-CM | POA: Diagnosis not present

## 2018-08-23 DIAGNOSIS — R2681 Unsteadiness on feet: Secondary | ICD-10-CM | POA: Diagnosis not present

## 2018-08-23 DIAGNOSIS — M48062 Spinal stenosis, lumbar region with neurogenic claudication: Secondary | ICD-10-CM | POA: Diagnosis not present

## 2018-08-28 DIAGNOSIS — R2681 Unsteadiness on feet: Secondary | ICD-10-CM | POA: Diagnosis not present

## 2018-08-28 DIAGNOSIS — M48062 Spinal stenosis, lumbar region with neurogenic claudication: Secondary | ICD-10-CM | POA: Diagnosis not present

## 2018-08-30 ENCOUNTER — Other Ambulatory Visit: Payer: Self-pay | Admitting: Internal Medicine

## 2018-08-30 DIAGNOSIS — M48062 Spinal stenosis, lumbar region with neurogenic claudication: Secondary | ICD-10-CM | POA: Diagnosis not present

## 2018-08-30 DIAGNOSIS — R2681 Unsteadiness on feet: Secondary | ICD-10-CM | POA: Diagnosis not present

## 2018-09-05 DIAGNOSIS — M48062 Spinal stenosis, lumbar region with neurogenic claudication: Secondary | ICD-10-CM | POA: Diagnosis not present

## 2018-09-05 DIAGNOSIS — R2681 Unsteadiness on feet: Secondary | ICD-10-CM | POA: Diagnosis not present

## 2018-09-07 DIAGNOSIS — M48062 Spinal stenosis, lumbar region with neurogenic claudication: Secondary | ICD-10-CM | POA: Diagnosis not present

## 2018-09-07 DIAGNOSIS — R2681 Unsteadiness on feet: Secondary | ICD-10-CM | POA: Diagnosis not present

## 2018-09-11 DIAGNOSIS — M48062 Spinal stenosis, lumbar region with neurogenic claudication: Secondary | ICD-10-CM | POA: Diagnosis not present

## 2018-09-11 DIAGNOSIS — R2681 Unsteadiness on feet: Secondary | ICD-10-CM | POA: Diagnosis not present

## 2018-09-13 DIAGNOSIS — R2681 Unsteadiness on feet: Secondary | ICD-10-CM | POA: Diagnosis not present

## 2018-09-13 DIAGNOSIS — M48062 Spinal stenosis, lumbar region with neurogenic claudication: Secondary | ICD-10-CM | POA: Diagnosis not present

## 2018-09-13 DIAGNOSIS — I10 Essential (primary) hypertension: Secondary | ICD-10-CM | POA: Diagnosis not present

## 2018-09-19 DIAGNOSIS — M48062 Spinal stenosis, lumbar region with neurogenic claudication: Secondary | ICD-10-CM | POA: Diagnosis not present

## 2018-09-19 DIAGNOSIS — I10 Essential (primary) hypertension: Secondary | ICD-10-CM | POA: Diagnosis not present

## 2018-09-19 DIAGNOSIS — R2681 Unsteadiness on feet: Secondary | ICD-10-CM | POA: Diagnosis not present

## 2018-09-21 DIAGNOSIS — R2681 Unsteadiness on feet: Secondary | ICD-10-CM | POA: Diagnosis not present

## 2018-09-21 DIAGNOSIS — M48062 Spinal stenosis, lumbar region with neurogenic claudication: Secondary | ICD-10-CM | POA: Diagnosis not present

## 2018-09-25 DIAGNOSIS — R2681 Unsteadiness on feet: Secondary | ICD-10-CM | POA: Diagnosis not present

## 2018-09-25 DIAGNOSIS — M48062 Spinal stenosis, lumbar region with neurogenic claudication: Secondary | ICD-10-CM | POA: Diagnosis not present

## 2018-09-27 DIAGNOSIS — R2681 Unsteadiness on feet: Secondary | ICD-10-CM | POA: Diagnosis not present

## 2018-09-27 DIAGNOSIS — M48062 Spinal stenosis, lumbar region with neurogenic claudication: Secondary | ICD-10-CM | POA: Diagnosis not present

## 2018-10-02 ENCOUNTER — Other Ambulatory Visit: Payer: Self-pay | Admitting: Women's Health

## 2018-10-02 DIAGNOSIS — B009 Herpesviral infection, unspecified: Secondary | ICD-10-CM

## 2018-10-03 DIAGNOSIS — R2681 Unsteadiness on feet: Secondary | ICD-10-CM | POA: Diagnosis not present

## 2018-10-03 DIAGNOSIS — M48062 Spinal stenosis, lumbar region with neurogenic claudication: Secondary | ICD-10-CM | POA: Diagnosis not present

## 2018-10-11 ENCOUNTER — Ambulatory Visit
Admission: RE | Admit: 2018-10-11 | Discharge: 2018-10-11 | Disposition: A | Discharge status: Home / Self Care | Payer: PPO | Source: Ambulatory Visit | Attending: Sports Medicine | Admitting: Sports Medicine

## 2018-10-11 ENCOUNTER — Other Ambulatory Visit: Payer: Self-pay

## 2018-10-11 DIAGNOSIS — Z780 Asymptomatic menopausal state: Secondary | ICD-10-CM | POA: Diagnosis not present

## 2018-10-11 DIAGNOSIS — M85852 Other specified disorders of bone density and structure, left thigh: Secondary | ICD-10-CM | POA: Diagnosis not present

## 2018-10-11 DIAGNOSIS — M810 Age-related osteoporosis without current pathological fracture: Secondary | ICD-10-CM

## 2018-10-12 ENCOUNTER — Other Ambulatory Visit: Payer: PPO | Admitting: Internal Medicine

## 2018-10-12 ENCOUNTER — Other Ambulatory Visit: Payer: Self-pay

## 2018-10-12 DIAGNOSIS — Z0000 Encounter for general adult medical examination without abnormal findings: Secondary | ICD-10-CM | POA: Diagnosis not present

## 2018-10-12 DIAGNOSIS — E782 Mixed hyperlipidemia: Secondary | ICD-10-CM | POA: Diagnosis not present

## 2018-10-12 DIAGNOSIS — E039 Hypothyroidism, unspecified: Secondary | ICD-10-CM | POA: Diagnosis not present

## 2018-10-12 LAB — CBC WITH DIFFERENTIAL/PLATELET
Absolute Monocytes: 465 cells/uL (ref 200–950)
Basophils Absolute: 17 cells/uL (ref 0–200)
Basophils Relative: 0.2 %
Eosinophils Absolute: 33 cells/uL (ref 15–500)
Eosinophils Relative: 0.4 %
HCT: 40.4 % (ref 35.0–45.0)
Hemoglobin: 13.4 g/dL (ref 11.7–15.5)
Lymphs Abs: 1726 cells/uL (ref 850–3900)
MCH: 33.5 pg — ABNORMAL HIGH (ref 27.0–33.0)
MCHC: 33.2 g/dL (ref 32.0–36.0)
MCV: 101 fL — ABNORMAL HIGH (ref 80.0–100.0)
MPV: 9.6 fL (ref 7.5–12.5)
Monocytes Relative: 5.6 %
Neutro Abs: 6059 cells/uL (ref 1500–7800)
Neutrophils Relative %: 73 %
Platelets: 217 10*3/uL (ref 140–400)
RBC: 4 10*6/uL (ref 3.80–5.10)
RDW: 12.6 % (ref 11.0–15.0)
Total Lymphocyte: 20.8 %
WBC: 8.3 10*3/uL (ref 3.8–10.8)

## 2018-10-12 LAB — COMPLETE METABOLIC PANEL WITH GFR
AG Ratio: 1.7 (calc) (ref 1.0–2.5)
ALT: 10 U/L (ref 6–29)
AST: 15 U/L (ref 10–35)
Albumin: 4 g/dL (ref 3.6–5.1)
Alkaline phosphatase (APISO): 52 U/L (ref 37–153)
BUN/Creatinine Ratio: 16 (calc) (ref 6–22)
BUN: 16 mg/dL (ref 7–25)
CO2: 29 mmol/L (ref 20–32)
Calcium: 9.1 mg/dL (ref 8.6–10.4)
Chloride: 102 mmol/L (ref 98–110)
Creat: 0.98 mg/dL — ABNORMAL HIGH (ref 0.60–0.93)
GFR, Est African American: 67 mL/min/{1.73_m2} (ref >=60)
GFR, Est Non African American: 58 mL/min/{1.73_m2} — ABNORMAL LOW (ref >=60)
Globulin: 2.4 g/dL (calc) (ref 1.9–3.7)
Glucose, Bld: 90 mg/dL (ref 65–99)
Potassium: 4.1 mmol/L (ref 3.5–5.3)
Sodium: 137 mmol/L (ref 135–146)
Total Bilirubin: 0.3 mg/dL (ref 0.2–1.2)
Total Protein: 6.4 g/dL (ref 6.1–8.1)

## 2018-10-12 LAB — TSH: TSH: 2.96 mIU/L (ref 0.40–4.50)

## 2018-10-12 LAB — LIPID PANEL
Cholesterol: 188 mg/dL (ref <200)
HDL: 64 mg/dL (ref >=50)
LDL Cholesterol (Calc): 93 mg/dL (calc)
Non-HDL Cholesterol (Calc): 124 mg/dL (calc) (ref <130)
Total CHOL/HDL Ratio: 2.9 (calc) (ref <5.0)
Triglycerides: 223 mg/dL — ABNORMAL HIGH (ref <150)

## 2018-10-16 DIAGNOSIS — H43813 Vitreous degeneration, bilateral: Secondary | ICD-10-CM | POA: Diagnosis not present

## 2018-10-16 DIAGNOSIS — H02834 Dermatochalasis of left upper eyelid: Secondary | ICD-10-CM | POA: Diagnosis not present

## 2018-10-16 DIAGNOSIS — H16223 Keratoconjunctivitis sicca, not specified as Sjogren's, bilateral: Secondary | ICD-10-CM | POA: Diagnosis not present

## 2018-10-16 DIAGNOSIS — H5203 Hypermetropia, bilateral: Secondary | ICD-10-CM | POA: Diagnosis not present

## 2018-10-16 DIAGNOSIS — H02831 Dermatochalasis of right upper eyelid: Secondary | ICD-10-CM | POA: Diagnosis not present

## 2018-10-16 DIAGNOSIS — H524 Presbyopia: Secondary | ICD-10-CM | POA: Diagnosis not present

## 2018-10-16 DIAGNOSIS — Z961 Presence of intraocular lens: Secondary | ICD-10-CM | POA: Diagnosis not present

## 2018-10-16 DIAGNOSIS — H52203 Unspecified astigmatism, bilateral: Secondary | ICD-10-CM | POA: Diagnosis not present

## 2018-10-17 ENCOUNTER — Encounter: Payer: Self-pay | Admitting: Internal Medicine

## 2018-10-17 ENCOUNTER — Ambulatory Visit (INDEPENDENT_AMBULATORY_CARE_PROVIDER_SITE_OTHER): Payer: PPO | Admitting: Internal Medicine

## 2018-10-17 ENCOUNTER — Other Ambulatory Visit: Payer: Self-pay

## 2018-10-17 VITALS — BP 90/60 | HR 84 | Ht 62.0 in | Wt 123.0 lb

## 2018-10-17 DIAGNOSIS — Z0000 Encounter for general adult medical examination without abnormal findings: Secondary | ICD-10-CM | POA: Diagnosis not present

## 2018-10-17 DIAGNOSIS — K219 Gastro-esophageal reflux disease without esophagitis: Secondary | ICD-10-CM

## 2018-10-17 DIAGNOSIS — F329 Major depressive disorder, single episode, unspecified: Secondary | ICD-10-CM | POA: Diagnosis not present

## 2018-10-17 DIAGNOSIS — Z9889 Other specified postprocedural states: Secondary | ICD-10-CM | POA: Diagnosis not present

## 2018-10-17 DIAGNOSIS — G250 Essential tremor: Secondary | ICD-10-CM | POA: Diagnosis not present

## 2018-10-17 DIAGNOSIS — F32A Depression, unspecified: Secondary | ICD-10-CM

## 2018-10-17 DIAGNOSIS — Z23 Encounter for immunization: Secondary | ICD-10-CM | POA: Diagnosis not present

## 2018-10-17 DIAGNOSIS — E890 Postprocedural hypothyroidism: Secondary | ICD-10-CM

## 2018-10-17 DIAGNOSIS — R413 Other amnesia: Secondary | ICD-10-CM | POA: Diagnosis not present

## 2018-10-17 DIAGNOSIS — Z87891 Personal history of nicotine dependence: Secondary | ICD-10-CM

## 2018-10-17 DIAGNOSIS — M8580 Other specified disorders of bone density and structure, unspecified site: Secondary | ICD-10-CM

## 2018-10-17 DIAGNOSIS — F419 Anxiety disorder, unspecified: Secondary | ICD-10-CM

## 2018-10-17 DIAGNOSIS — E782 Mixed hyperlipidemia: Secondary | ICD-10-CM | POA: Diagnosis not present

## 2018-10-17 DIAGNOSIS — E039 Hypothyroidism, unspecified: Secondary | ICD-10-CM

## 2018-10-17 LAB — POCT URINALYSIS DIPSTICK
Appearance: NEGATIVE
Bilirubin, UA: NEGATIVE
Blood, UA: NEGATIVE
Glucose, UA: NEGATIVE
Ketones, UA: NEGATIVE
Leukocytes, UA: NEGATIVE
Nitrite, UA: NEGATIVE
Odor: NEGATIVE
Protein, UA: POSITIVE — AB
Spec Grav, UA: 1.01 (ref 1.010–1.025)
Urobilinogen, UA: 0.2 E.U./dL
pH, UA: 7.5 (ref 5.0–8.0)

## 2018-10-17 NOTE — Patient Instructions (Addendum)
Watch diet and exercise.  Follow-up in 6 months with TSH and lipid panel.. Continue current medications.  Flu vaccine given.

## 2018-10-17 NOTE — Progress Notes (Signed)
Subjective:    Patient ID: Megan Williams, female    DOB: August 23, 1945, 73 y.o.   MRN: [id number]  HPI 73 year old Female for Medicare wellness visit, health maintenance exam and evaluation of medical issues.  She has a history of hypothyroidism.  She has a history of memory loss.  History of constipation.  History of T12 compression fracture in 2017 after a fall in the shower.  In 2012 she had an L4-L5 decompression stabilization by Dr. Ellene Route.  She also has had cervical spine 3 level fusion C4- C7 by Dr. Ellene Route in 2005.  In August 2018 she fell onto her knees and into her buttocks and had increasing pain in left buttock.  Was seen at [facility] January 2019 and at that time x-ray showed no fracture.  She does have osteopenia and sees Dr. Layne Benton for Prolia injections.  Had joint reconstruction of right thumb 2008 by Dr. Daylene Katayama.  In December 2019 had decompression L3-L4 by Dr. Ellene Route.  Is doing well from that.  Had normal colonoscopy in 2018 by Dr. Watt Climes.  She has a history of memory disturbance  treated with Namenda and history of depression.  She is on Effexor.  History of GE reflux treated with PPI.  Sees Dr. Joie Bimler for overactive bladder  Has been trying to stop smoking with Chantix.  Has been diagnosed with tremor by a neurologist and mild cognitive impairment.  Sees Noemi Chapel for anxiety depression.  Had thyroidectomy 1968, tonsillectomy 1963, hysterectomy without nephrectomy 1979, cholecystectomy 1993  Colonoscopy done by Dr. Collene Mares in 2005  History of low B12 level in 2016 in the low 200 range.  This improved with oral B12 supplement.  Family history unknown since she is adopted  Social history: She is married.  2 adult children.  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     Review of Systems no new complaints     Objective:   Physical Exam Blood pressure 90/60, pulse 84, pulse oximetry 98% weight 123 pounds BMI 22.50  Skin warm and dry.  Nodes none.  No thyromegaly.  TMs are clear.  Neck is supple.  No JVD.  No carotid bruits.  Chest clear to auscultation.  Cardiac exam regular rate and rhythm normal S1 and S2 without murmurs or gallops.  Abdomen soft nondistended without hepatosplenomegaly masses or tenderness.  Bimanual exam is normal.  No lower extremity edema.  Neuro: She is slightly forgetful and anxious.  However has no focal deficits on brief neurological exam       Assessment & Plan:  History of lumbar spinal stenosis status post multiple lumbar surgeries  Mild cognitive disorder treated with Namenda  History of depression treated with counseling and antidepressant medication  Hypothyroidism-treated with thyroid replacement medication  Osteopenia treated by Dr. Layne Benton with Prolia  Benign essential tremor followed by neurologist  Plan: Continue current medications and return in 6 months.  Advised quitting smoking.  Subjective:   Patient presents for Medicare Annual/Subsequent preventive examination.  Review Past Medical/Family/Social: She is adopted do not know family history  Risk Factors  Current exercise habits: Used to go to the gym but hands are currently closed.  Walks some. Dietary issues discussed: Low-fat low carbohydrate  Cardiac risk factors: Hyperlipidemia and history of smoking  Depression Screen  (Note: if answer to either  of the following is "Yes", a more complete depression screening is indicated)   Over the past two weeks, have you felt down, depressed or hopeless? No  Over the past two weeks, have you felt little interest or pleasure in doing things? No Have you lost interest or pleasure in daily life? No Do you often feel hopeless? No Do you cry easily over simple problems? No   Activities of Daily Living  In your present state of health, do you have any difficulty  performing the following activities?:   Driving? No  Managing money? No  Feeding yourself? No  Getting from bed to chair? No  Climbing a flight of stairs? No  Preparing food and eating?: No  Bathing or showering? No  Getting dressed: No  Getting to the toilet? No  Using the toilet:No  Moving around from place to place: No  In the past year have you fallen or had a near fall?:  Yes Are you sexually active? No  Do you have more than one partner? No   Hearing Difficulties: No  Do you often ask people to speak up or repeat themselves?  Yes  do you experience ringing or noises in your ears? No  Do you have difficulty understanding soft or whispered voices?  Yes Do you feel that you have a problem with memory?  Yes Do you often misplace items?  Yes   Home Safety:  Do you have a smoke alarm at your residence? Yes Do you have grab bars in the bathroom?  Yes Do you have throw rugs in your house?  Yes   Cognitive Testing  Alert? Yes Normal Appearance?Yes  Oriented to person? Yes Place? Yes  Time? Yes  Recall of three objects?  Not tested today Can perform simple calculations? Yes  Displays appropriate judgment?Yes  Can read the correct time from a watch face?Yes   List the Names of Other Physician/Practitioners you currently use:  See referral list for the physicians patient is currently seeing.     Review of Systems: See above  Objective:     General appearance: Appears stated age and thin.  Seems anxious Head: Normocephalic, without obvious abnormality, atraumatic  Eyes: conj clear, EOMi PEERLA  Ears: normal TM's and external ear canals both ears  Nose: Nares normal. Septum midline. Mucosa normal. No drainage or sinus tenderness.  Throat: lips, mucosa, and tongue normal; teeth and gums normal  Neck: no adenopathy, no carotid bruit, no JVD, supple, symmetrical, trachea midline and thyroid not enlarged, symmetric, no tenderness/mass/nodules  No CVA tenderness.  Lungs:  clear to auscultation bilaterally  Breasts: normal appearance, no masses or tenderness Heart: regular rate and rhythm, S1, S2 normal, no murmur, click, rub or gallop  Abdomen: soft, non-tender; bowel sounds normal; no masses, no organomegaly  Musculoskeletal: ROM normal in all joints, no crepitus, no deformity, Normal muscle strengthen. Back  is symmetric, no curvature. Skin: Skin color, texture, turgor normal. No rashes or lesions  Lymph nodes: Cervical, supraclavicular, and axillary nodes normal.  Neurologic: CN 2 -12 Normal, Normal symmetric reflexes. Normal coordination and gait  Psych: Alert & Oriented x 3, Mood appear stable.    Assessment:    Annual wellness medicare exam   Plan:    During the course of the visit the patient was educated and counseled about appropriate screening and preventive services including:    See above  Recommend annual flu vaccine    Patient Instructions (the written plan) was given to the patient.  Medicare Attestation  I have personally reviewed:  The patient's medical and social history  Their use of alcohol, tobacco or illicit drugs  Their current medications and supplements  The patient's functional ability including ADLs,fall risks, home safety risks, cognitive, and hearing and visual impairment  Diet and physical activities  Evidence for depression or mood disorders  The patient's weight, height, BMI, and visual acuity have been recorded in the chart. I have made referrals, counseling, and provided education to the patient based on review of the above and I have provided the patient with a written personalized care plan for preventive services.

## 2018-10-25 DIAGNOSIS — F3341 Major depressive disorder, recurrent, in partial remission: Secondary | ICD-10-CM | POA: Diagnosis not present

## 2018-10-25 DIAGNOSIS — F411 Generalized anxiety disorder: Secondary | ICD-10-CM | POA: Diagnosis not present

## 2018-10-26 ENCOUNTER — Telehealth: Payer: Self-pay | Admitting: Internal Medicine

## 2018-10-26 DIAGNOSIS — R32 Unspecified urinary incontinence: Secondary | ICD-10-CM

## 2018-10-26 NOTE — Telephone Encounter (Signed)
Please  Have Megan Williams order KUB flat and upright tomorrow stat. We do not have appt. I think she should check in with Dr. Ellene Route to see if it is neurological.

## 2018-10-26 NOTE — Telephone Encounter (Signed)
Lecresha Mannering [phone]  Megan Williams called to say that on Sunday she started having bowel and urine incontinence, she does not even feel when she goes. She is wearing pad, abdomen feels hard. She has messed up several sets of cloths, with not being able to feel when she needs to go to bathroom. No fever

## 2018-10-26 NOTE — Telephone Encounter (Signed)
KUB has been ordered. Patient was notified.

## 2018-10-27 ENCOUNTER — Ambulatory Visit
Admission: RE | Admit: 2018-10-27 | Discharge: 2018-10-27 | Disposition: A | Discharge status: Home / Self Care | Payer: PPO | Source: Ambulatory Visit | Attending: Internal Medicine | Admitting: Internal Medicine

## 2018-10-27 ENCOUNTER — Other Ambulatory Visit: Payer: Self-pay

## 2018-10-27 DIAGNOSIS — R32 Unspecified urinary incontinence: Secondary | ICD-10-CM

## 2018-10-28 ENCOUNTER — Encounter: Payer: Self-pay | Admitting: Internal Medicine

## 2018-10-30 NOTE — Telephone Encounter (Signed)
LVM to let Dr Renold Genta know how she is doing and if she is still having problems and if so we can schedule office visit to discuss trying Lajean Saver before meals, or if she wanted to go and see Dr Watt Climes to evaluate.

## 2018-10-31 NOTE — Telephone Encounter (Signed)
Megan Williams called back to say she is much better, in fact she is now the other way and will call back if she needs [place]. She is taken some probotics

## 2018-11-10 DIAGNOSIS — M48062 Spinal stenosis, lumbar region with neurogenic claudication: Secondary | ICD-10-CM | POA: Diagnosis not present

## 2018-11-10 DIAGNOSIS — M5416 Radiculopathy, lumbar region: Secondary | ICD-10-CM | POA: Diagnosis not present

## 2018-11-13 ENCOUNTER — Other Ambulatory Visit: Payer: Self-pay

## 2018-11-13 ENCOUNTER — Encounter: Payer: Self-pay | Admitting: Neurology

## 2018-11-13 ENCOUNTER — Ambulatory Visit: Payer: PPO | Admitting: Neurology

## 2018-11-13 VITALS — BP 113/72 | HR 82 | Temp 97.7°F | Ht 62.0 in | Wt 124.5 lb

## 2018-11-13 DIAGNOSIS — G43009 Migraine without aura, not intractable, without status migrainosus: Secondary | ICD-10-CM | POA: Diagnosis not present

## 2018-11-13 DIAGNOSIS — R251 Tremor, unspecified: Secondary | ICD-10-CM

## 2018-11-13 DIAGNOSIS — G3184 Mild cognitive impairment, so stated: Secondary | ICD-10-CM

## 2018-11-13 MED ORDER — PROPRANOLOL HCL 40 MG PO TABS: 40.0000 mg | ORAL_TABLET | Freq: Two times a day (BID) | ORAL | 11 refills | Status: DC

## 2018-11-13 NOTE — Progress Notes (Signed)
PATIENT: Megan Williams DOB: 03-04-1945  REASON FOR VISIT: follow up HISTORY FROM: patient  HISTORY OF PRESENT ILLNESS: Today 11/13/18  HISTORY  HISTORY Megan Williams): [name] 73 years old right-handed female, seen in refer by her primary care physician Dr. Tedra Senegal for evaluation of tremor, memory loss, and gait problems   She had a history of hypertension, depression, anxiety, used to work as a Radio broadcast assistant, and church Glass blower/designer  Around 2015, she began to notice memory trouble, she needs family to remind her multiple times, tends to forget people's name, phone number, she used to be able to remember all the congregation's name in the past, her memory trouble since 4 gradually getting worse, she still driving without getting lost  She reported history of migraine since young, for a while in September to October 2015, she has migraines almost on a daily basis, which has improved after stopped taking Trileptal  She also reported mild bilateral hands tremor since 2014, most noticeable when she holding a utensil, or write with a pencil, she also noticed mild bilateral hands weakness, in Thanksgiving 2015, she has dropped her dishes to the floor, because of bilateral hands weakness,  Around 2015, she also noticed mild stiff unsteady gait, worsening urinary urgency, she denies significant neck pain, complains of moderate low back pain, she denies bilateral upper or lower extremity paresthesia  She is adopted, does not know family history, none of her children has tremor  MRI brain film in November 2016, mild generalized atrophy, mild supratentorium small vessel disease,   MRI of the cervical spine showed evidence of previous fusion from C4-7, 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   UPDATE October 27 2016: We reviewed the laboratory evaluation in August 2018, A1c was 4.9, normal folic acid, vitamin [id number], TSH, fasting lipid profile,  triglycerides 198, LDL 85, CMP showed creatinine of 1.0, CBC showed MCV of 105,  She has retired, continue combating depression, not memory loss, especially when she is stressed, she is on polypharmacy treatment, Effexor, Wellbutrin, Xanax as needed trazodone as needed for sleep  She also has bilateral hands tremor, mild gait abnormality due to low back pain  Update May 23, 2017: She still noticed mild memory loss, but overall doing well. She is now retired, Tour manager, [place] Status Examination was 30 out of 30 today, still on polypharmacy treatment, trazodone, Effexor, Xanax, Wellbutrin  UPDATE Sept 14 2020: She continues to complain of worsening bilateral hands tremor, which started since 2001, she chronic insomnia, chronic, half tablet of Xanax for treatment, Her memory loss has been stable, continue to be active, taking care of  [employer]     REVIEW OF SYSTEMS: Out of a complete 14 system review of symptoms, the patient complains only of the following symptoms, and all other reviewed systems are negative. As above.  ALLERGIES: Allergies  Allergen Reactions   Doxycycline Other (See Comments)    Skin peeling    Penicillins Hives    Has patient had a PCN reaction causing immediate rash, facial/tongue/throat swelling, SOB or lightheadedness with hypotension: No Has patient had a PCN reaction causing severe rash involving mucus membranes or skin necrosis: No Has patient had a PCN reaction that required hospitalization: No Has patient had a PCN reaction occurring within the last 10 years: No If all of the above answers are "NO", then may proceed with Cephalosporin use.   Celebrex [Celecoxib] Other (See Comments)  Blistering, pain, swelling   Cephalosporins Nausea Only    Diarrhea, hives   Latex Rash    HOME MEDICATIONS: Outpatient Medications Prior to Visit  Medication Sig Dispense Refill   ALPRAZolam (XANAX) 1 MG tablet Take 0.5-1 mg by mouth 2  (two) times daily as needed for anxiety.      Artificial Tear Ointment (REFRESH LACRI-LUBE) OINT Place 1 drop into both eyes at bedtime.     buPROPion (WELLBUTRIN XL) 300 MG 24 hr tablet Take 300 mg by mouth daily.      Calcium-Vitamin D-Vitamin K (VIACTIV CALCIUM PLUS D PO) Take 2 tablets by mouth daily.     carboxymethylcellulose (REFRESH) 1 % ophthalmic solution Place 1 drop into both eyes 4 (four) times daily.     Cholecalciferol (VITAMIN D) 50 MCG (2000 UT) tablet Take 2,000 Units by mouth daily.     Cyanocobalamin (B-12) 1000 MCG SUBL Place 1,000 mcg under the tongue daily.     denosumab (PROLIA) 60 MG/ML SOLN injection Inject 60 mg into the skin every 6 (six) months. Administer in upper arm, thigh, or abdomen     diphenhydrAMINE (BENADRYL) 25 MG tablet Take 25-50 mg by mouth daily as needed (hives).      estradiol (ESTRACE) 1 MG tablet Take 1 tablet (1 mg total) by mouth daily. 90 tablet 4   fexofenadine (ALLEGRA) 60 MG tablet Take 60 mg by mouth daily.     levothyroxine (SYNTHROID) 100 MCG tablet TAKE 1 TABLET BY MOUTH DAILY. 90 tablet 0   Magnesium Oxide (HM MAGNESIUM) 250 MG TABS Take 250 mg by mouth daily.      memantine (NAMENDA) 10 MG tablet Take 1 tablet (10 mg total) by mouth 2 (two) times daily. TAKE 10 mg TABLET BY MOUTH 2 TIMES DAILY. 180 tablet 4   methocarbamol (ROBAXIN) 500 MG tablet Take 1 tablet (500 mg total) by mouth every 6 (six) hours as needed for muscle spasms. 40 tablet 3   Multiple Vitamin (MULTIVITAMIN) tablet Take 2 tablets by mouth daily.      naproxen sodium (ALEVE) 220 MG tablet Take 440 mg by mouth daily as needed (pain).     pantoprazole (PROTONIX) 40 MG tablet Take 1 tablet (40 mg total) by mouth daily. (Patient taking differently: Take 20 mg by mouth daily as needed (acid reflux). ) 90 tablet 3   sodium chloride (OCEAN) 0.65 % SOLN nasal spray Place 1 spray into both nostrils as needed for congestion.     traZODone (DESYREL) 100 MG tablet  Take 100 mg by mouth at bedtime.      valACYclovir (VALTREX) 500 MG tablet TAKE 1 TABLET BY MOUTH TWICE DAILY FOR 3 TO 5 DAYS. 30 tablet 12   venlafaxine XR (EFFEXOR-XR) 150 MG 24 hr capsule Take 300 mg by mouth 2 (two) times daily.      vitamin k 100 MCG tablet Take 100 mcg by mouth daily.     No facility-administered medications prior to visit.     PAST MEDICAL HISTORY: Past Medical History:  Diagnosis Date   Allergy    Anxiety    Arthritis    Complication of anesthesia    "difficulty waking up. I could hear them but I couldn't wake up"   COPD (chronic obstructive pulmonary disease) (HCC)    Depression    Emphysema    Esophagitis    Herpes simplex    Hypertension    Migraines    Osteopenia    Vitamin D deficiency  PAST SURGICAL HISTORY: Past Surgical History:  Procedure Laterality Date   BACK SURGERY  02/14/2018   lumbar 3-4  fusion   BREAST EXCISIONAL BIOPSY Left 1979   Benign    CATARACT EXTRACTION, BILATERAL     CERVICAL FUSION     CHOLECYSTECTOMY     KYPHOPLASTY  09/19/2015   Dr. Ellene Route  T12   LUMBAR [place] SURGERY  11/11/2017   L3-4   Thumb surg     THYROIDECTOMY     TONSILLECTOMY     VAGINAL HYSTERECTOMY      FAMILY HISTORY: Family History  Adopted: Yes  Family history unknown: Yes    SOCIAL HISTORY: Social History   Socioeconomic History   Marital status: Married    Spouse name: Not on file   Number of children: 2   Years of education: 14   Highest education level: Not on file  Occupational History   Occupation: Retired  Scientist, product/process development strain: Not on file   Food insecurity    Worry: Not on file    Inability: Not on Lexicographer needs    Medical: Not on file    Non-medical: Not on file  Tobacco Use   Smoking status: Current Every Day Smoker   Smokeless tobacco: Never Used   Tobacco comment: 3  cigarettes daily  Substance and Sexual Activity   Alcohol use: Yes     Alcohol/week: 1.0 standard drinks    Types: 1 Glasses of wine per week    Comment: dinner or bed    Drug use: No   Sexual activity: Not Currently    Birth control/protection: Surgical    Comment: INTERCOURSE AGE 9, SEXUAL PARTNERS LESS THAN 5  Lifestyle   Physical activity    Days per week: Not on file    Minutes per session: Not on file   Stress: Not on file  Relationships   Social connections    Talks on phone: Not on file    Gets together: Not on file    Attends religious service: Not on file    Active member of club or organization: Not on file    Attends meetings of clubs or organizations: Not on file    Relationship status: Not on file   Intimate partner violence    Fear of current or ex partner: Not on file    Emotionally abused: Not on file    Physically abused: Not on file    Forced sexual activity: Not on file  Other Topics Concern   Not on file  Social History Narrative   Lives at home with husband.   Right-handed.   1 cup caffeine daily.      PHYSICAL EXAM  Vitals:   11/13/18 1535  Height: 5\' 2"  (1.575 m)   Body mass index is 22.5 kg/m.  Generalized: Well developed, in no acute distress  MMSE - Mini Mental State Exam 05/10/2018 05/23/2017 10/27/2016  Orientation to time 5 5 5   Orientation to Place 5 5 5   Registration 3 3 3   Attention/ Calculation 5 5 5   Recall 3 3 2   Language- name 2 objects 2 2 2   Language- repeat 1 1 1   Language- follow 3 step command 3 3 3   Language- read & follow direction 1 1 1   Write a sentence 1 1 1   Copy design 1 1 1   Total score 30 30 29    Neurological examination  Mentation: Alert oriented to time, place, history  taking. Follows all commands speech and language fluent Cranial nerve II-XII: Pupils were equal round reactive to light. Extraocular movements were full, visual field were full on confrontational test. Facial sensation and strength were normal. Uvula tongue midline. Head turning and shoulder shrug  were  normal and symmetric. Motor:  Mild action tremor at both hands.  Normal strength, no rigidity, no bradykinesia Sensory: Sensory testing is intact to soft touch on all 4 extremities. No evidence of extinction is noted.  Coordination: Cerebellar testing reveals good finger-nose-finger and heel-to-shin bilaterally.  Gait and station: Gait is normal. Tandem gait is Unsteady. Romberg is negative. No drift is seen.  Reflexes: Deep tendon reflexes are symmetric and normal bilaterally.   DIAGNOSTIC DATA (LABS, IMAGING, TESTING) - I reviewed patient records, labs, notes, testing and imaging myself where available.  Lab Results  Component Value Date   WBC 8.3 10/12/2018   HGB 13.4 10/12/2018   HCT 40.4 10/12/2018   MCV 101.0 (H) 10/12/2018   PLT 217 10/12/2018      Component Value Date/Time   NA 137 10/12/2018 1134   K 4.1 10/12/2018 1134   CL 102 10/12/2018 1134   CO2 29 10/12/2018 1134   GLUCOSE 90 10/12/2018 1134   BUN 16 10/12/2018 1134   CREATININE 0.98 (H) 10/12/2018 1134   CALCIUM 9.1 10/12/2018 1134   PROT 6.4 10/12/2018 1134   ALBUMIN 4.0 10/07/2016 0925   AST 15 10/12/2018 1134   ALT 10 10/12/2018 1134   ALKPHOS 49 10/07/2016 0925   BILITOT 0.3 10/12/2018 1134   GFRNONAA 58 (L) 10/12/2018 1134   GFRAA 67 10/12/2018 1134   Lab Results  Component Value Date   CHOL 188 10/12/2018   HDL 64 10/12/2018   LDLCALC 93 10/12/2018   TRIG 223 (H) 10/12/2018   CHOLHDL 2.9 10/12/2018   Lab Results  Component Value Date   HGBA1C 4.9 10/07/2016   Lab Results  Component Value Date   VITAMINB12 750 10/07/2016   Lab Results  Component Value Date   TSH 2.96 10/12/2018      ASSESSMENT AND PLAN 73 y.o. year old female   Essential tremor Mild cognitive impairment  Add on propanolol 40 mg twice a day  Continue moderate exercise.   Marcial Pacas, M.D. Ph.D.  [facility] [place], [place] [address] Phone: [phone] Fax:      [phone]

## 2018-11-23 ENCOUNTER — Other Ambulatory Visit: Payer: Self-pay | Admitting: Neurological Surgery

## 2018-11-23 DIAGNOSIS — M5416 Radiculopathy, lumbar region: Secondary | ICD-10-CM

## 2018-11-30 ENCOUNTER — Ambulatory Visit
Admission: RE | Admit: 2018-11-30 | Discharge: 2018-11-30 | Disposition: A | Discharge status: Home / Self Care | Payer: PPO | Source: Ambulatory Visit | Attending: Neurological Surgery | Admitting: Neurological Surgery

## 2018-11-30 ENCOUNTER — Other Ambulatory Visit: Payer: Self-pay

## 2018-11-30 DIAGNOSIS — M5126 Other intervertebral disc displacement, lumbar region: Secondary | ICD-10-CM | POA: Diagnosis not present

## 2018-11-30 DIAGNOSIS — M5416 Radiculopathy, lumbar region: Secondary | ICD-10-CM

## 2018-11-30 DIAGNOSIS — M48061 Spinal stenosis, lumbar region without neurogenic claudication: Secondary | ICD-10-CM | POA: Diagnosis not present

## 2018-12-06 ENCOUNTER — Encounter: Payer: Self-pay | Admitting: Gynecology

## 2018-12-06 DIAGNOSIS — I10 Essential (primary) hypertension: Secondary | ICD-10-CM | POA: Diagnosis not present

## 2018-12-06 DIAGNOSIS — M47817 Spondylosis without myelopathy or radiculopathy, lumbosacral region: Secondary | ICD-10-CM | POA: Diagnosis not present

## 2018-12-11 DIAGNOSIS — M47817 Spondylosis without myelopathy or radiculopathy, lumbosacral region: Secondary | ICD-10-CM | POA: Diagnosis not present

## 2019-01-16 DIAGNOSIS — M545 Low back pain: Secondary | ICD-10-CM | POA: Diagnosis not present

## 2019-01-16 DIAGNOSIS — M810 Age-related osteoporosis without current pathological fracture: Secondary | ICD-10-CM | POA: Diagnosis not present

## 2019-02-06 ENCOUNTER — Encounter: Payer: Self-pay | Admitting: Internal Medicine

## 2019-02-06 ENCOUNTER — Ambulatory Visit (INDEPENDENT_AMBULATORY_CARE_PROVIDER_SITE_OTHER): Payer: PPO | Admitting: Internal Medicine

## 2019-02-06 ENCOUNTER — Other Ambulatory Visit: Payer: Self-pay

## 2019-02-06 ENCOUNTER — Telehealth: Payer: Self-pay | Admitting: Internal Medicine

## 2019-02-06 VITALS — Ht 64.0 in | Wt 124.0 lb

## 2019-02-06 DIAGNOSIS — J3489 Other specified disorders of nose and nasal sinuses: Secondary | ICD-10-CM

## 2019-02-06 MED ORDER — PREDNISONE 10 MG PO TABS: ORAL_TABLET | ORAL | 0 refills | Status: DC

## 2019-02-06 NOTE — Telephone Encounter (Signed)
Set up virtual visit 

## 2019-02-06 NOTE — Telephone Encounter (Signed)
Megan Williams [phone]  Sabiha called to say for about a month she has been having a lot of clear drainage from her nose. It is a constant drainage to where when she wears a mask she has to put Kleenex in between her nose and the mask. Then last week the top of her head started feeling like some one was pounding trying to get out. She had indirect COVID exposure on Thanksgiving. No fever, no other symptoms.

## 2019-02-06 NOTE — Progress Notes (Signed)
   Subjective:    Patient ID: Megan Williams, female    DOB: February 20, 1946, 73 y.o.   MRN: [id number]  HPI 73 year old Female seen by interactive audio and video telecommunications due to coronavirus pandemic.  She is agreeable to visit in this format.  She is identified using 2 identifiers as Megan Williams , a patient in this practice.  Patient is complaining of headache and rhinorrhea.  Says she has been having clear drainage for about a month from her nose.  Recently has had pounding headache.  No direct COVID-19 exposure.  No fever.  No cough.  Has taken Allegra for allergic rhinitis symptoms in the past.  History of hypothyroidism    Review of Systems see above     Objective:   Physical Exam Reports that she is afebrile.  Seen virtually no acute distress.  Voice is not hoarse.  Not heard to be coughing.       Assessment & Plan:  Rhinorrhea-?  Allergic rhinitis  Plan: Patient will start prednisone in tapering course going from 60 mg to 0 mg over 7 days.  She will contact me if does not obtain relief with this medication.  This may also help her headache.

## 2019-02-06 NOTE — Telephone Encounter (Signed)
Schedule virtual visit 

## 2019-02-09 NOTE — Telephone Encounter (Signed)
Megan Williams called back today with an update, she stated that she still has horrible headache and nose is still running, she is also on day 3 of medication. She will call back next week to let you know how she is feeling.

## 2019-02-09 NOTE — Telephone Encounter (Signed)
Continue medication as prescribed.

## 2019-02-12 NOTE — Telephone Encounter (Signed)
Called patient back, she took last medicine today, she is feeling much better.

## 2019-02-17 ENCOUNTER — Other Ambulatory Visit: Payer: Self-pay | Admitting: Internal Medicine

## 2019-02-25 NOTE — Patient Instructions (Signed)
Take prednisone in tapering course as directed and call if not improving.

## 2019-03-21 DIAGNOSIS — R351 Nocturia: Secondary | ICD-10-CM | POA: Diagnosis not present

## 2019-03-21 DIAGNOSIS — N3281 Overactive bladder: Secondary | ICD-10-CM | POA: Diagnosis not present

## 2019-04-12 ENCOUNTER — Other Ambulatory Visit: Payer: PPO | Admitting: Internal Medicine

## 2019-04-12 ENCOUNTER — Other Ambulatory Visit: Payer: Self-pay

## 2019-04-12 DIAGNOSIS — E039 Hypothyroidism, unspecified: Secondary | ICD-10-CM | POA: Diagnosis not present

## 2019-04-12 DIAGNOSIS — E782 Mixed hyperlipidemia: Secondary | ICD-10-CM

## 2019-04-13 LAB — LIPID PANEL
Cholesterol: 195 mg/dL (ref <200)
HDL: 57 mg/dL (ref >=50)
LDL Cholesterol (Calc): 109 mg/dL (calc) — ABNORMAL HIGH
Non-HDL Cholesterol (Calc): 138 mg/dL (calc) — ABNORMAL HIGH (ref <130)
Total CHOL/HDL Ratio: 3.4 (calc) (ref <5.0)
Triglycerides: 173 mg/dL — ABNORMAL HIGH (ref <150)

## 2019-04-13 LAB — TSH: TSH: 0.87 mIU/L (ref 0.40–4.50)

## 2019-04-17 ENCOUNTER — Ambulatory Visit (INDEPENDENT_AMBULATORY_CARE_PROVIDER_SITE_OTHER): Payer: PPO | Admitting: Internal Medicine

## 2019-04-17 ENCOUNTER — Other Ambulatory Visit: Payer: Self-pay

## 2019-04-17 ENCOUNTER — Encounter: Payer: Self-pay | Admitting: Internal Medicine

## 2019-04-17 VITALS — BP 110/80 | HR 66 | Temp 98.0°F | Ht 64.0 in | Wt 123.0 lb

## 2019-04-17 DIAGNOSIS — L719 Rosacea, unspecified: Secondary | ICD-10-CM

## 2019-04-17 DIAGNOSIS — E559 Vitamin D deficiency, unspecified: Secondary | ICD-10-CM | POA: Diagnosis not present

## 2019-04-17 DIAGNOSIS — R718 Other abnormality of red blood cells: Secondary | ICD-10-CM | POA: Diagnosis not present

## 2019-04-17 DIAGNOSIS — K219 Gastro-esophageal reflux disease without esophagitis: Secondary | ICD-10-CM

## 2019-04-17 DIAGNOSIS — E039 Hypothyroidism, unspecified: Secondary | ICD-10-CM

## 2019-04-17 DIAGNOSIS — R5383 Other fatigue: Secondary | ICD-10-CM

## 2019-04-17 DIAGNOSIS — M810 Age-related osteoporosis without current pathological fracture: Secondary | ICD-10-CM

## 2019-04-17 MED ORDER — METRONIDAZOLE 1 % EX GEL: Freq: Every day | CUTANEOUS | 0 refills | Status: DC

## 2019-04-17 MED ORDER — PANTOPRAZOLE SODIUM 40 MG PO TBEC: 40.0000 mg | DELAYED_RELEASE_TABLET | Freq: Every day | ORAL | 1 refills | Status: DC

## 2019-04-17 NOTE — Progress Notes (Signed)
   Subjective:    Patient ID: Megan Williams, female    DOB: December 14, 1945, 74 y.o.   MRN: [id number]  HPI 74 year old Female for follow up. Is on Prolia per Dr. Layne Benton.  Complains of rash above eyebrows abnormalities consistent with acting rosacea for which MetroGel will be prescribed.  Warts Protonix refill for GE reflux symptoms which she takes intermittently.  Continues to say lisinopril low-dose for anxiety and sleep disorder.  Needs labs drawn for Dr. Layne Benton today including vitamin D level.  She does not want to be on lipid-lowering medication.  TSH is stable on current dose of thyroid replacement.  Triglycerides have improved from 223 to 173.  LDL was slightly elevated at 109.  Total cholesterol is 195.  She is smoking 3 cigarettes a day.  Cessation counseling given.  Very little back pain at present time.  Having issues with rhinorrhea.  Does not want injection of Depo-Medrol so recommended Flonase nasal spray 2 sprays in each nostril daily.  She thinks is related to mask wearing.  It may be related to allergic rhinitis.  She does have Allegra that she takes daily.  No longer taking Robaxin for back pain  Review of Systems see above     Objective:   Physical Exam Vital signs reviewed.  Weight is stable.  Skin warm and dry.  Has erythematous rash above eyebrows it is not scaly and bridge of nose which is not scaly.  Neck is supple without thyromegaly or adenopathy.  Chest is clear to auscultation without rales or wheezing.  Cardiac exam regular rate and rhythm normal S1 and S2.  Extremities without edema.  She is quite talkative.  Affect is her usual affect.  She is at times forgetful.       Assessment & Plan:  Rhinorrhea-recommend Flonase daily and continue Allegra  Hypothyroidism-stable on current dose of thyroid replacement  Hyperlipidemia-mixed continue to work on diet exercise.  Does not want to be on statin medication  Osteoporosis-is on Prolia per Dr. Layne Benton labs drawn and  will be faxed to her  Acne rosacea- prescribed MetroGel to face daily  History of smoking-down to 3 cigarettes daily  Return in August for health maintenance exam

## 2019-04-17 NOTE — Patient Instructions (Signed)
Metronidazole gel for acne rosacea to face daily. Refill Protonix generic for GE reflux. Try Flonase for chronic rhinorrhea. Labs drawn for Dr. Layne Benton. TSH is WNL. Continue to watch diet and exercise. Lipids have improved.

## 2019-04-18 ENCOUNTER — Other Ambulatory Visit: Payer: Self-pay

## 2019-04-18 DIAGNOSIS — R718 Other abnormality of red blood cells: Secondary | ICD-10-CM

## 2019-04-19 LAB — CBC WITH DIFFERENTIAL/PLATELET
Absolute Monocytes: 551 cells/uL (ref 200–950)
Basophils Absolute: 57 cells/uL (ref 0–200)
Basophils Relative: 0.6 %
Eosinophils Absolute: 76 cells/uL (ref 15–500)
Eosinophils Relative: 0.8 %
HCT: 39.9 % (ref 35.0–45.0)
Hemoglobin: 13.2 g/dL (ref 11.7–15.5)
Lymphs Abs: 2698 cells/uL (ref 850–3900)
MCH: 33.6 pg — ABNORMAL HIGH (ref 27.0–33.0)
MCHC: 33.1 g/dL (ref 32.0–36.0)
MCV: 101.5 fL — ABNORMAL HIGH (ref 80.0–100.0)
MPV: 9.4 fL (ref 7.5–12.5)
Monocytes Relative: 5.8 %
Neutro Abs: 6118 cells/uL (ref 1500–7800)
Neutrophils Relative %: 64.4 %
Platelets: 255 10*3/uL (ref 140–400)
RBC: 3.93 10*6/uL (ref 3.80–5.10)
RDW: 12.4 % (ref 11.0–15.0)
Total Lymphocyte: 28.4 %
WBC: 9.5 10*3/uL (ref 3.8–10.8)

## 2019-04-19 LAB — COMPLETE METABOLIC PANEL WITH GFR
AG Ratio: 1.7 (calc) (ref 1.0–2.5)
ALT: 10 U/L (ref 6–29)
AST: 13 U/L (ref 10–35)
Albumin: 4 g/dL (ref 3.6–5.1)
Alkaline phosphatase (APISO): 47 U/L (ref 37–153)
BUN/Creatinine Ratio: 14 (calc) (ref 6–22)
BUN: 14 mg/dL (ref 7–25)
CO2: 29 mmol/L (ref 20–32)
Calcium: 9.6 mg/dL (ref 8.6–10.4)
Chloride: 104 mmol/L (ref 98–110)
Creat: 0.99 mg/dL — ABNORMAL HIGH (ref 0.60–0.93)
GFR, Est African American: 66 mL/min/{1.73_m2} (ref >=60)
GFR, Est Non African American: 57 mL/min/{1.73_m2} — ABNORMAL LOW (ref >=60)
Globulin: 2.3 g/dL (calc) (ref 1.9–3.7)
Glucose, Bld: 85 mg/dL (ref 65–99)
Potassium: 4.7 mmol/L (ref 3.5–5.3)
Sodium: 139 mmol/L (ref 135–146)
Total Bilirubin: 0.3 mg/dL (ref 0.2–1.2)
Total Protein: 6.3 g/dL (ref 6.1–8.1)

## 2019-04-19 LAB — VITAMIN D 25 HYDROXY (VIT D DEFICIENCY, FRACTURES): Vit D, 25-Hydroxy: 67 ng/mL (ref 30–100)

## 2019-04-19 LAB — TEST AUTHORIZATION

## 2019-04-19 LAB — B12 AND FOLATE PANEL
Folate: >24 ng/mL
Vitamin B-12: >2000 pg/mL — ABNORMAL HIGH (ref 200–1100)

## 2019-05-08 DIAGNOSIS — K529 Noninfective gastroenteritis and colitis, unspecified: Secondary | ICD-10-CM | POA: Diagnosis not present

## 2019-05-08 DIAGNOSIS — R159 Full incontinence of feces: Secondary | ICD-10-CM | POA: Diagnosis not present

## 2019-05-08 DIAGNOSIS — K2100 Gastro-esophageal reflux disease with esophagitis, without bleeding: Secondary | ICD-10-CM | POA: Diagnosis not present

## 2019-05-17 ENCOUNTER — Other Ambulatory Visit: Payer: Self-pay | Admitting: Internal Medicine

## 2019-05-21 NOTE — Progress Notes (Signed)
PATIENT: Megan Williams DOB: Feb 09, 1946  REASON FOR VISIT: follow up HISTORY FROM: patient  HISTORY OF PRESENT ILLNESS: Today 05/22/19  HISTORY HISTORY  HISTORY Megan Williams): Megan Williams 74 years old right-handed female, seen in refer by her primary care physician Dr. Tedra Senegal for evaluation of tremor, memory loss, and gait problems   She had a history of hypertension, depression, anxiety, used to work as a Radio broadcast assistant, and church Glass blower/designer  Around 2015, she began to notice memory trouble, she needs family to remind her multiple times, tends to forget people's name, phone number, she used to be able to remember all the congregation's name in the past, her memory trouble since 4 gradually getting worse, she still driving without getting lost  She reported history of migraine since young, for a while in September to October 2015, she has migraines almost on a daily basis, which has improved after stopped taking Trileptal  She also reported mild bilateral hands tremor since 2014, most noticeable when she holding a utensil, or write with a pencil, she also noticed mild bilateral hands weakness, in Thanksgiving 2015, she has dropped her dishes to the floor, because of bilateral hands weakness,  Around 2015, she also noticed mild stiff unsteady gait, worsening urinary urgency, she denies significant neck pain, complains of moderate low back pain, she denies bilateral upper or lower extremity paresthesia  She is adopted, does not know family history, none of her children has tremor  MRI brain film in November 2016, mild generalized atrophy, mild supratentorium small vessel disease,   MRI of the cervical spine showed evidence of previous fusion from C4-7, 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  UPDATE October 27 2016: We reviewed the laboratory evaluation in August 2018, A1c was 4.9, normal folic acid, vitamin 123456, TSH, fasting lipid profile,  triglycerides 198, LDL 85, CMP showed creatinine of 1.0, CBC showed MCV of 105,  She has retired, continue combating depression, not memory loss, especially when she is stressed, she is on polypharmacy treatment, Effexor, Wellbutrin, Xanax as needed trazodone as needed for sleep  She also has bilateral hands tremor, mild gait abnormality due to low back pain  Update May 23, 2017: She still noticed mild memory loss, but overall doing well. She is now retired, Financial controller Terrier,Mini-Mental Status Examination was 30 out of 30 today, still on polypharmacy treatment, trazodone, Effexor, Xanax, Wellbutrin  UPDATE Sept 14 2020: She continues to complain of worsening bilateral hands tremor, which started since 2001, she chronic insomnia, chronic, half tablet of Xanax for treatment, Her memory loss has been stable, continue to be active, taking care of  [place] [employer]   Update May 22, 2019 SS: When last seen by Dr. [name], propanolol 40 mg twice a day was added for essential tremor.  Tremor is much improved, much easier when typing, has really seen a big difference overall.  Her memory is stable, her husband says she has repetitive questioning, but reports a hearing issue.  Her mother had dementia.  She remains active, looks forward to less pandemic restriction, has 3 boston terriers. MMSE 30/30.  REVIEW OF SYSTEMS: Out of a complete 14 system review of symptoms, the patient complains only of the following symptoms, and all other reviewed systems are negative.  Tremor, memory loss  ALLERGIES: Allergies  Allergen Reactions  . Doxycycline Other (See Comments)    Skin peeling   . Penicillins Hives    Has patient had  a PCN reaction causing immediate rash, facial/tongue/throat swelling, SOB or lightheadedness with hypotension: No Has patient had a PCN reaction causing severe rash involving mucus membranes or skin necrosis: No Has patient had a PCN reaction that required hospitalization:  No Has patient had a PCN reaction occurring within the last 10 years: No If all of the above answers are "NO", then may proceed with Cephalosporin use.  . Celebrex [Celecoxib] Other (See Comments)    Blistering, pain, swelling  . Cephalosporins Nausea Only    Diarrhea, hives  . Latex Rash    HOME MEDICATIONS: Outpatient Medications Prior to Visit  Medication Sig Dispense Refill  . ALPRAZolam (XANAX) 1 MG tablet Take 0.5-1 mg by mouth 2 (two) times daily as needed for anxiety.     . Artificial Tear Ointment (REFRESH LACRI-LUBE) OINT Place 1 drop into both eyes at bedtime.    Marland Kitchen buPROPion (WELLBUTRIN XL) 300 MG 24 hr tablet Take 300 mg by mouth daily.     . Calcium-Vitamin D-Vitamin K (VIACTIV CALCIUM PLUS D PO) Take 1 tablet by mouth daily.     . carboxymethylcellulose (REFRESH) 1 % ophthalmic solution Place 1 drop into both eyes 4 (four) times daily.    . Cholecalciferol (VITAMIN D) 50 MCG (2000 UT) tablet Take 2,000 Units by mouth daily.    . Cyanocobalamin (B-12) 1000 MCG SUBL Place 1,000 mcg under the tongue daily.    Marland Kitchen denosumab (PROLIA) 60 MG/ML SOLN injection Inject 60 mg into the skin every 6 (six) months. Administer in upper arm, thigh, or abdomen    . diphenhydrAMINE (BENADRYL) 25 MG tablet Take 25-50 mg by mouth daily as needed (hives).     Marland Kitchen estradiol (ESTRACE) 1 MG tablet Take 1 tablet (1 mg total) by mouth daily. 90 tablet 4  . fexofenadine (ALLEGRA) 60 MG tablet Take 60 mg by mouth daily.    Marland Kitchen levothyroxine (SYNTHROID) 100 MCG tablet TAKE 1 TABLET BY MOUTH DAILY. 90 tablet 1  . Magnesium Oxide (HM MAGNESIUM) 250 MG TABS Take 250 mg by mouth daily.     . memantine (NAMENDA) 10 MG tablet Take 1 tablet (10 mg total) by mouth 2 (two) times daily. TAKE 10 mg TABLET BY MOUTH 2 TIMES DAILY. 180 tablet 4  . metroNIDAZOLE (METROGEL) 1 % gel Apply topically daily. 45 g 0  . Multiple Vitamin (MULTIVITAMIN) tablet Take 2 tablets by mouth daily.     . naproxen sodium (ALEVE) 220 MG  tablet Take 440 mg by mouth daily as needed (pain).    . pantoprazole (PROTONIX) 40 MG tablet Take 1 tablet (40 mg total) by mouth daily. 90 tablet 1  . propranolol (INDERAL) 40 MG tablet Take 1 tablet (40 mg total) by mouth 2 (two) times daily. 60 tablet 11  . sodium chloride (OCEAN) 0.65 % SOLN nasal spray Place 1 spray into both nostrils as needed for congestion.    . traZODone (DESYREL) 100 MG tablet Take 100 mg by mouth at bedtime.     . valACYclovir (VALTREX) 500 MG tablet TAKE 1 TABLET BY MOUTH TWICE DAILY FOR 3 TO 5 DAYS. 30 tablet 12  . venlafaxine XR (EFFEXOR-XR) 150 MG 24 hr capsule Take 300 mg by mouth 2 (two) times daily.      No facility-administered medications prior to visit.    PAST MEDICAL HISTORY: Past Medical History:  Diagnosis Date  . Allergy   . Anxiety   . Arthritis   . Complication of anesthesia    "  difficulty waking up. I could hear them but I couldn't wake up"  . COPD (chronic obstructive pulmonary disease) ([place])   . Depression   . Emphysema   . Esophagitis   . Herpes simplex   . Hypertension   . Migraines   . Osteopenia   . Vitamin D deficiency     PAST SURGICAL HISTORY: Past Surgical History:  Procedure Laterality Date  . BACK SURGERY  02/14/2018   lumbar 3-4  fusion  . BREAST EXCISIONAL BIOPSY Left 1979   Benign   . CATARACT EXTRACTION, BILATERAL    . CERVICAL FUSION    . CHOLECYSTECTOMY    . KYPHOPLASTY  09/19/2015   Dr. Ellene Route  T12  . LUMBAR DISC SURGERY  11/11/2017   L3-4  . Thumb surg    . THYROIDECTOMY    . TONSILLECTOMY    . VAGINAL HYSTERECTOMY      FAMILY HISTORY: Family History  Adopted: Yes  Family history unknown: Yes    SOCIAL HISTORY: Social History   Socioeconomic History  . Marital status: Married    Spouse name: Not on file  . Number of children: 2  . Years of education: 67  . Highest education level: Not on file  Occupational History  . Occupation: Retired  Tobacco Use  . Smoking status: Current Every  Day Smoker  . Smokeless tobacco: Never Used  . Tobacco comment: 3  cigarettes daily  Substance and Sexual Activity  . Alcohol use: Yes    Alcohol/week: 1.0 standard drinks    Types: 1 Glasses of wine per week    Comment: dinner or bed   . Drug use: No  . Sexual activity: Not Currently    Birth control/protection: Surgical    Comment: INTERCOURSE AGE 58, SEXUAL PARTNERS LESS THAN 5  Other Topics Concern  . Not on file  Social History Narrative   Lives at home with husband.   Right-handed.   1 cup caffeine daily.   Social Determinants of Health   Financial Resource Strain:   . Difficulty of Paying Living Expenses:   Food Insecurity:   . Worried About Charity fundraiser in the Last Year:   . Arboriculturist in the Last Year:   Transportation Needs:   . Film/video editor (Medical):   Marland Kitchen Lack of Transportation (Non-Medical):   Physical Activity:   . Days of Exercise per Week:   . Minutes of Exercise per Session:   Stress:   . Feeling of Stress :   Social Connections:   . Frequency of Communication with Friends and Family:   . Frequency of Social Gatherings with Friends and Family:   . Attends Religious Services:   . Active Member of Clubs or Organizations:   . Attends Archivist Meetings:   Marland Kitchen Marital Status:   Intimate Partner Violence:   . Fear of Current or Ex-Partner:   . Emotionally Abused:   Marland Kitchen Physically Abused:   . Sexually Abused:    PHYSICAL EXAM  Vitals:   05/22/19 1324  BP: (!) 112/59  Pulse: 71  Temp: (!) 97 F (36.1 C)  Weight: 125 lb (56.7 kg)  Height: 5\' 2"  (1.575 m)   Body mass index is 22.86 kg/m.  Generalized: Well developed, in no acute distress  MMSE - Mini Mental State Exam 05/22/2019 05/10/2018 05/23/2017  Orientation to time 5 5 5   Orientation to Place 5 5 5   Registration 3 3 3   Attention/ Calculation 5  5 5  Recall 3 3 3   Language- name 2 objects 2 2 2   Language- repeat 1 1 1   Language- follow 3 step command 3 3 3    Language- read & follow direction 1 1 1   Write a sentence 1 1 1   Copy design 1 1 1   Total score 30 30 30     Neurological examination  Mentation: Alert oriented to time, place, history taking. Follows all commands speech and language fluent Cranial nerve II-XII: Pupils were equal round reactive to light. Extraocular movements were full, visual field were full on confrontational test. Facial sensation and strength were normal. Head turning and shoulder shrug  were normal and symmetric. Motor: The motor testing reveals 5 over 5 strength of all 4 extremities. Good symmetric motor tone is noted throughout.  No intention tremor was noted. Sensory: Sensory testing is intact to soft touch on all 4 extremities. No evidence of extinction is noted.  Coordination: Cerebellar testing reveals good finger-nose-finger and heel-to-shin bilaterally.  Gait and station: Gait is normal. Tandem gait is mildly unsteady.  Romberg is negative. No drift is seen.  Reflexes: Deep tendon reflexes are symmetric and normal bilaterally.   DIAGNOSTIC DATA (LABS, IMAGING, TESTING) - I reviewed patient records, labs, notes, testing and imaging myself where available.  Lab Results  Component Value Date   WBC 9.5 04/17/2019   HGB 13.2 04/17/2019   HCT 39.9 04/17/2019   MCV 101.5 (H) 04/17/2019   PLT 255 04/17/2019      Component Value Date/Time   NA 139 04/17/2019 1018   K 4.7 04/17/2019 1018   CL 104 04/17/2019 1018   CO2 29 04/17/2019 1018   GLUCOSE 85 04/17/2019 1018   BUN 14 04/17/2019 1018   CREATININE 0.99 (H) 04/17/2019 1018   CALCIUM 9.6 04/17/2019 1018   PROT 6.3 04/17/2019 1018   ALBUMIN 4.0 10/07/2016 0925   AST 13 04/17/2019 1018   ALT 10 04/17/2019 1018   ALKPHOS 49 10/07/2016 0925   BILITOT 0.3 04/17/2019 1018   GFRNONAA 57 (L) 04/17/2019 1018   GFRAA 66 04/17/2019 1018   Lab Results  Component Value Date   CHOL 195 04/12/2019   HDL 57 04/12/2019   LDLCALC 109 (H) 04/12/2019   TRIG 173  (H) 04/12/2019   CHOLHDL 3.4 04/12/2019   Lab Results  Component Value Date   HGBA1C 4.9 10/07/2016   Lab Results  Component Value Date   VITAMINB12 >2,000 (H) 04/17/2019   Lab Results  Component Value Date   TSH 0.87 04/12/2019      ASSESSMENT AND PLAN 74 y.o. year old female  has a past medical history of Allergy, Anxiety, Arthritis, Complication of anesthesia, COPD (chronic obstructive pulmonary disease) ([place]), Depression, Emphysema, Esophagitis, Herpes simplex, Hypertension, Migraines, Osteopenia, and Vitamin D deficiency. here with:  1.  Essential tremor -Much improved with propanolol -Continue propanolol 40 mg twice a day  2.  Mild cognitive impairment -Memory remains stable, 30/30 -Continue Namenda 10 mg twice a day  -Follow-up in 1 year or sooner if needed   I spent 20 minutes of face-to-face and non-face-to-face time with patient.  This included previsit chart review, lab review, study review, order entry, electronic health record documentation, patient education.   Butler Denmark, AGNP-C, DNP 05/22/2019, 1:27 PM [facility] [address], [place], [place] [address] [phone]

## 2019-05-22 ENCOUNTER — Ambulatory Visit: Payer: PPO | Admitting: Neurology

## 2019-05-22 ENCOUNTER — Encounter: Payer: Self-pay | Admitting: Neurology

## 2019-05-22 ENCOUNTER — Other Ambulatory Visit: Payer: Self-pay

## 2019-05-22 VITALS — BP 112/59 | HR 71 | Temp 97.0°F | Ht 62.0 in | Wt 125.0 lb

## 2019-05-22 DIAGNOSIS — R251 Tremor, unspecified: Secondary | ICD-10-CM

## 2019-05-22 DIAGNOSIS — F09 Unspecified mental disorder due to known physiological condition: Secondary | ICD-10-CM

## 2019-05-22 MED ORDER — PROPRANOLOL HCL 40 MG PO TABS: 40.0000 mg | ORAL_TABLET | Freq: Two times a day (BID) | ORAL | 11 refills | Status: DC

## 2019-05-22 MED ORDER — MEMANTINE HCL 10 MG PO TABS: 10.0000 mg | ORAL_TABLET | Freq: Two times a day (BID) | ORAL | 4 refills | Status: DC

## 2019-05-22 NOTE — Patient Instructions (Signed)
It was nice to see you today :) Continue current medications See you in 1 year

## 2019-05-22 NOTE — Progress Notes (Signed)
I have reviewed and agreed above plan. 

## 2019-06-22 DIAGNOSIS — N390 Urinary tract infection, site not specified: Secondary | ICD-10-CM | POA: Diagnosis not present

## 2019-06-22 DIAGNOSIS — N3281 Overactive bladder: Secondary | ICD-10-CM | POA: Diagnosis not present

## 2019-07-18 DIAGNOSIS — M545 Low back pain: Secondary | ICD-10-CM | POA: Diagnosis not present

## 2019-07-18 DIAGNOSIS — M810 Age-related osteoporosis without current pathological fracture: Secondary | ICD-10-CM | POA: Diagnosis not present

## 2019-07-24 ENCOUNTER — Other Ambulatory Visit: Payer: Self-pay

## 2019-07-25 ENCOUNTER — Ambulatory Visit (INDEPENDENT_AMBULATORY_CARE_PROVIDER_SITE_OTHER): Payer: PPO | Admitting: Nurse Practitioner

## 2019-07-25 ENCOUNTER — Encounter: Payer: Self-pay | Admitting: Nurse Practitioner

## 2019-07-25 VITALS — BP 110/80 | Ht 62.0 in | Wt 124.0 lb

## 2019-07-25 DIAGNOSIS — M8588 Other specified disorders of bone density and structure, other site: Secondary | ICD-10-CM

## 2019-07-25 DIAGNOSIS — Z9181 History of falling: Secondary | ICD-10-CM

## 2019-07-25 DIAGNOSIS — Z8619 Personal history of other infectious and parasitic diseases: Secondary | ICD-10-CM | POA: Diagnosis not present

## 2019-07-25 DIAGNOSIS — Z79890 Hormone replacement therapy (postmenopausal): Secondary | ICD-10-CM | POA: Diagnosis not present

## 2019-07-25 DIAGNOSIS — Z9289 Personal history of other medical treatment: Secondary | ICD-10-CM

## 2019-07-25 DIAGNOSIS — M85852 Other specified disorders of bone density and structure, left thigh: Secondary | ICD-10-CM

## 2019-07-25 DIAGNOSIS — Z01419 Encounter for gynecological examination (general) (routine) without abnormal findings: Secondary | ICD-10-CM | POA: Diagnosis not present

## 2019-07-25 NOTE — Patient Instructions (Signed)
Health Maintenance After Age 72 After age 72, you are at a higher risk for certain long-term diseases and infections as well as injuries from falls. Falls are a major cause of broken bones and head injuries in people who are older than age 72. Getting regular preventive care can help to keep you healthy and well. Preventive care includes getting regular testing and making lifestyle changes as recommended by your health care provider. Talk with your health care provider about:  Which screenings and tests you should have. A screening is a test that checks for a disease when you have no symptoms.  A diet and exercise plan that is right for you. What should I know about screenings and tests to prevent falls? Screening and testing are the best ways to find a health problem early. Early diagnosis and treatment give you the best chance of managing medical conditions that are common after age 72. Certain conditions and lifestyle choices may make you more likely to have a fall. Your health care provider may recommend:  Regular vision checks. Poor vision and conditions such as cataracts can make you more likely to have a fall. If you wear glasses, make sure to get your prescription updated if your vision changes.  Medicine review. Work with your health care provider to regularly review all of the medicines you are taking, including over-the-counter medicines. Ask your health care provider about any side effects that may make you more likely to have a fall. Tell your health care provider if any medicines that you take make you feel dizzy or sleepy.  Osteoporosis screening. Osteoporosis is a condition that causes the bones to get weaker. This can make the bones weak and cause them to break more easily.  Blood pressure screening. Blood pressure changes and medicines to control blood pressure can make you feel dizzy.  Strength and balance checks. Your health care provider may recommend certain tests to check your  strength and balance while standing, walking, or changing positions.  Foot health exam. Foot pain and numbness, as well as not wearing proper footwear, can make you more likely to have a fall.  Depression screening. You may be more likely to have a fall if you have a fear of falling, feel emotionally low, or feel unable to do activities that you used to do.  Alcohol use screening. Using too much alcohol can affect your balance and may make you more likely to have a fall. What actions can I take to lower my risk of falls? General instructions  Talk with your health care provider about your risks for falling. Tell your health care provider if: ? You fall. Be sure to tell your health care provider about all falls, even ones that seem minor. ? You feel dizzy, sleepy, or off-balance.  Take over-the-counter and prescription medicines only as told by your health care provider. These include any supplements.  Eat a healthy diet and maintain a healthy weight. A healthy diet includes low-fat dairy products, low-fat (lean) meats, and fiber from whole grains, beans, and lots of fruits and vegetables. Home safety  Remove any tripping hazards, such as rugs, cords, and clutter.  Install safety equipment such as grab bars in bathrooms and safety rails on stairs.  Keep rooms and walkways well-lit. Activity   Follow a regular exercise program to stay fit. This will help you maintain your balance. Ask your health care provider what types of exercise are appropriate for you.  If you need a cane or   walker, use it as recommended by your health care provider.  Wear supportive shoes that have nonskid soles. Lifestyle  Do not drink alcohol if your health care provider tells you not to drink.  If you drink alcohol, limit how much you have: ? 0-1 drink a day for women. ? 0-2 drinks a day for men.  Be aware of how much alcohol is in your drink. In the [place] one drink equals one typical bottle of beer (12  oz), one-half glass of wine (5 oz), or one shot of hard liquor (1 oz).  Do not use any products that contain nicotine or tobacco, such as cigarettes and e-cigarettes. If you need help quitting, ask your health care provider. Summary  Having a healthy lifestyle and getting preventive care can help to protect your health and wellness after age 72.  Screening and testing are the best way to find a health problem early and help you avoid having a fall. Early diagnosis and treatment give you the best chance for managing medical conditions that are more common for people who are older than age 72.  Falls are a major cause of broken bones and head injuries in people who are older than age 72. Take precautions to prevent a fall at home.  Work with your health care provider to learn what changes you can make to improve your health and wellness and to prevent falls. This information is not intended to replace advice given to you by your health care provider. Make sure you discuss any questions you have with your health care provider. Document Revised: 06/08/2018 Document Reviewed: 12/29/2016 Elsevier Patient Education  2020 Elsevier Inc.  

## 2019-07-25 NOTE — Progress Notes (Signed)
   Megan Williams [birth year] [id number]   History:  74 y.o. [name] G2 P2 presents for breast and pelvic exam without GYN complaints. TVH 1979 due to abnormal pap, taking Estradiol 1mg  daily with good relief, no bleeding. History of osteopenia, hypothyroidism, HLD, mild cognitive impairment, and lumbar stenosis. Prolia SQ every 6 months and vitamin D supplement daily for osteopenia. Has gait problems and experiences frequent falls. Back fusion 01/2018. Experiences daily back pain with numbness down left leg, neurosurgery follows this, receives injections. Not sexually active. Gained 10 pounds during the pandemic.   Gynecologic History No LMP recorded. Patient has had a hysterectomy.   Last Pap: N/A. Normal pap history since TVH, no longer doing cervical screenings per pt request Last mammogram: 07/20/2018. Results were: normal Last colonoscopy: 2018. Results were: benign polyp Last Dexa: 2020. Results were: t-score -1.7, no FRAX score due to prolia  Past medical history, past surgical history, family history and social history were all reviewed and documented in the EPIC chart. Retired. 2 children.  ROS:  A ROS was performed and pertinent positives and negatives are included.  Exam:  Vitals:   07/25/19 1211  BP: 110/80  Weight: 124 lb (56.2 kg)  Height: 5\' 2"  (1.575 m)   Body mass index is 22.68 kg/m.  General appearance:  Normal Thyroid:  Symmetrical, normal in size, without palpable masses or nodularity. Respiratory  Auscultation:  Clear without wheezing or rhonchi Cardiovascular  Auscultation:  Regular rate, without rubs, murmurs or gallops  Edema/varicosities:  Not grossly evident Abdominal  Soft,nontender, without masses, guarding or rebound.  Liver/spleen:  No organomegaly noted  Hernia:  None appreciated  Skin  Inspection:  Grossly normal   Breasts: Examined lying and sitting.   Right: Without masses, retractions, discharge or axillary adenopathy.   Left: Without masses,  retractions, discharge or axillary adenopathy. Gentitourinary   Inguinal/mons:  Normal without inguinal adenopathy  External genitalia:  Normal  BUS/Urethra/Skene's glands:  Normal  Vagina:  Normal, atrophic changes  Cervix:  Absent   Uterus:  Absent  Adnexa/parametria:     Rt: Without masses or tenderness.   Lt: Without masses or tenderness.  Anus and perineum: Normal  Digital rectal exam: Normal sphincter tone without palpated masses or tenderness  Assessment/Plan:  74 y.o.  for breast and pelvic exam.    Well female exam with routine gynecological exam - Education provided on SBEs, importance of preventative screenings, current guidelines, high calcium diet, regular exercise, and multivitamin daily. Has tried to schedule mammogram but unable to get in touch with an associate. Labs done by PCP.   Osteopenia of left hip - Continue prolia and vitamin D supplement. Weight-bearing exercises, walk as much as she can tolerate with back pain. Yoga would be beneficial to help with balance. Also recommended using her cane when walking longer distances since she has fallen multiple times outside of the home. Home safety discussed.   Hormone replacement therapy (HRT) - continue estradiol for hot flashes, insomnia, and mood swings. Discussed the risks of continuing therapy.  Follow up in 1 year for annual      [place], 12:19 PM 07/25/2019

## 2019-08-08 DIAGNOSIS — F411 Generalized anxiety disorder: Secondary | ICD-10-CM | POA: Diagnosis not present

## 2019-08-08 DIAGNOSIS — F4323 Adjustment disorder with mixed anxiety and depressed mood: Secondary | ICD-10-CM | POA: Diagnosis not present

## 2019-08-08 DIAGNOSIS — F429 Obsessive-compulsive disorder, unspecified: Secondary | ICD-10-CM | POA: Diagnosis not present

## 2019-08-10 ENCOUNTER — Other Ambulatory Visit: Payer: Self-pay | Admitting: Nurse Practitioner

## 2019-08-10 DIAGNOSIS — Z1231 Encounter for screening mammogram for malignant neoplasm of breast: Secondary | ICD-10-CM

## 2019-08-16 ENCOUNTER — Other Ambulatory Visit: Payer: Self-pay

## 2019-08-16 ENCOUNTER — Ambulatory Visit
Admission: RE | Admit: 2019-08-16 | Discharge: 2019-08-16 | Disposition: A | Discharge status: Home / Self Care | Payer: PPO | Source: Ambulatory Visit | Attending: Nurse Practitioner | Admitting: Nurse Practitioner

## 2019-08-16 DIAGNOSIS — Z1231 Encounter for screening mammogram for malignant neoplasm of breast: Secondary | ICD-10-CM | POA: Diagnosis not present

## 2019-08-21 ENCOUNTER — Other Ambulatory Visit: Payer: Self-pay | Admitting: Nurse Practitioner

## 2019-08-22 DIAGNOSIS — M47817 Spondylosis without myelopathy or radiculopathy, lumbosacral region: Secondary | ICD-10-CM | POA: Diagnosis not present

## 2019-08-23 DIAGNOSIS — N3281 Overactive bladder: Secondary | ICD-10-CM | POA: Diagnosis not present

## 2019-08-23 DIAGNOSIS — N319 Neuromuscular dysfunction of bladder, unspecified: Secondary | ICD-10-CM | POA: Diagnosis not present

## 2019-08-27 DIAGNOSIS — F330 Major depressive disorder, recurrent, mild: Secondary | ICD-10-CM | POA: Diagnosis not present

## 2019-09-04 DIAGNOSIS — M47817 Spondylosis without myelopathy or radiculopathy, lumbosacral region: Secondary | ICD-10-CM | POA: Diagnosis not present

## 2019-09-04 DIAGNOSIS — M5416 Radiculopathy, lumbar region: Secondary | ICD-10-CM | POA: Diagnosis not present

## 2019-09-10 DIAGNOSIS — F330 Major depressive disorder, recurrent, mild: Secondary | ICD-10-CM | POA: Diagnosis not present

## 2019-09-24 DIAGNOSIS — F4323 Adjustment disorder with mixed anxiety and depressed mood: Secondary | ICD-10-CM | POA: Diagnosis not present

## 2019-09-24 DIAGNOSIS — F411 Generalized anxiety disorder: Secondary | ICD-10-CM | POA: Diagnosis not present

## 2019-10-15 ENCOUNTER — Other Ambulatory Visit: Payer: Self-pay

## 2019-10-15 ENCOUNTER — Other Ambulatory Visit: Payer: PPO | Admitting: Internal Medicine

## 2019-10-15 DIAGNOSIS — E039 Hypothyroidism, unspecified: Secondary | ICD-10-CM | POA: Diagnosis not present

## 2019-10-15 DIAGNOSIS — M810 Age-related osteoporosis without current pathological fracture: Secondary | ICD-10-CM | POA: Diagnosis not present

## 2019-10-15 DIAGNOSIS — E782 Mixed hyperlipidemia: Secondary | ICD-10-CM | POA: Diagnosis not present

## 2019-10-15 DIAGNOSIS — K219 Gastro-esophageal reflux disease without esophagitis: Secondary | ICD-10-CM

## 2019-10-15 DIAGNOSIS — Z0000 Encounter for general adult medical examination without abnormal findings: Secondary | ICD-10-CM

## 2019-10-15 DIAGNOSIS — E7800 Pure hypercholesterolemia, unspecified: Secondary | ICD-10-CM | POA: Diagnosis not present

## 2019-10-15 DIAGNOSIS — D7589 Other specified diseases of blood and blood-forming organs: Secondary | ICD-10-CM | POA: Diagnosis not present

## 2019-10-15 DIAGNOSIS — R413 Other amnesia: Secondary | ICD-10-CM | POA: Diagnosis not present

## 2019-10-16 ENCOUNTER — Other Ambulatory Visit: Payer: Self-pay

## 2019-10-16 DIAGNOSIS — D7589 Other specified diseases of blood and blood-forming organs: Secondary | ICD-10-CM

## 2019-10-16 DIAGNOSIS — R413 Other amnesia: Secondary | ICD-10-CM

## 2019-10-17 LAB — CBC WITH DIFFERENTIAL/PLATELET
Absolute Monocytes: 409 cells/uL (ref 200–950)
Basophils Absolute: 26 cells/uL (ref 0–200)
Basophils Relative: 0.3 %
Eosinophils Absolute: 17 cells/uL (ref 15–500)
Eosinophils Relative: 0.2 %
HCT: 43.2 % (ref 35.0–45.0)
Hemoglobin: 14.2 g/dL (ref 11.7–15.5)
Lymphs Abs: 2558 cells/uL (ref 850–3900)
MCH: 33.6 pg — ABNORMAL HIGH (ref 27.0–33.0)
MCHC: 32.9 g/dL (ref 32.0–36.0)
MCV: 102.4 fL — ABNORMAL HIGH (ref 80.0–100.0)
MPV: 9.5 fL (ref 7.5–12.5)
Monocytes Relative: 4.7 %
Neutro Abs: 5690 cells/uL (ref 1500–7800)
Neutrophils Relative %: 65.4 %
Platelets: 233 10*3/uL (ref 140–400)
RBC: 4.22 10*6/uL (ref 3.80–5.10)
RDW: 13.9 % (ref 11.0–15.0)
Total Lymphocyte: 29.4 %
WBC: 8.7 10*3/uL (ref 3.8–10.8)

## 2019-10-17 LAB — TEST AUTHORIZATION

## 2019-10-17 LAB — COMPLETE METABOLIC PANEL WITH GFR
AG Ratio: 1.5 (calc) (ref 1.0–2.5)
ALT: 10 U/L (ref 6–29)
AST: 15 U/L (ref 10–35)
Albumin: 3.8 g/dL (ref 3.6–5.1)
Alkaline phosphatase (APISO): 46 U/L (ref 37–153)
BUN: 10 mg/dL (ref 7–25)
CO2: 31 mmol/L (ref 20–32)
Calcium: 8.8 mg/dL (ref 8.6–10.4)
Chloride: 103 mmol/L (ref 98–110)
Creat: 0.91 mg/dL (ref 0.60–0.93)
GFR, Est African American: 73 mL/min/{1.73_m2} (ref >=60)
GFR, Est Non African American: 63 mL/min/{1.73_m2} (ref >=60)
Globulin: 2.6 g/dL (calc) (ref 1.9–3.7)
Glucose, Bld: 92 mg/dL (ref 65–99)
Potassium: 4.2 mmol/L (ref 3.5–5.3)
Sodium: 139 mmol/L (ref 135–146)
Total Bilirubin: 0.4 mg/dL (ref 0.2–1.2)
Total Protein: 6.4 g/dL (ref 6.1–8.1)

## 2019-10-17 LAB — FOLATE: Folate: 14.1 ng/mL

## 2019-10-17 LAB — LIPID PANEL
Cholesterol: 200 mg/dL — ABNORMAL HIGH (ref <200)
HDL: 66 mg/dL (ref >=50)
LDL Cholesterol (Calc): 95 mg/dL (calc)
Non-HDL Cholesterol (Calc): 134 mg/dL (calc) — ABNORMAL HIGH (ref <130)
Total CHOL/HDL Ratio: 3 (calc) (ref <5.0)
Triglycerides: 273 mg/dL — ABNORMAL HIGH (ref <150)

## 2019-10-17 LAB — VITAMIN B12: Vitamin B-12: >2000 pg/mL — ABNORMAL HIGH (ref 200–1100)

## 2019-10-17 LAB — TSH: TSH: 3.89 mIU/L (ref 0.40–4.50)

## 2019-10-18 DIAGNOSIS — H02831 Dermatochalasis of right upper eyelid: Secondary | ICD-10-CM | POA: Diagnosis not present

## 2019-10-18 DIAGNOSIS — H52203 Unspecified astigmatism, bilateral: Secondary | ICD-10-CM | POA: Diagnosis not present

## 2019-10-18 DIAGNOSIS — Z961 Presence of intraocular lens: Secondary | ICD-10-CM | POA: Diagnosis not present

## 2019-10-18 DIAGNOSIS — H16223 Keratoconjunctivitis sicca, not specified as Sjogren's, bilateral: Secondary | ICD-10-CM | POA: Diagnosis not present

## 2019-10-18 DIAGNOSIS — H43813 Vitreous degeneration, bilateral: Secondary | ICD-10-CM | POA: Diagnosis not present

## 2019-10-18 DIAGNOSIS — H02834 Dermatochalasis of left upper eyelid: Secondary | ICD-10-CM | POA: Diagnosis not present

## 2019-10-18 DIAGNOSIS — H524 Presbyopia: Secondary | ICD-10-CM | POA: Diagnosis not present

## 2019-10-18 DIAGNOSIS — H5203 Hypermetropia, bilateral: Secondary | ICD-10-CM | POA: Diagnosis not present

## 2019-10-19 ENCOUNTER — Other Ambulatory Visit: Payer: Self-pay

## 2019-10-19 ENCOUNTER — Ambulatory Visit (INDEPENDENT_AMBULATORY_CARE_PROVIDER_SITE_OTHER): Payer: PPO | Admitting: Internal Medicine

## 2019-10-19 ENCOUNTER — Encounter: Payer: Self-pay | Admitting: Internal Medicine

## 2019-10-19 VITALS — BP 120/70 | HR 63 | Ht 62.0 in | Wt 124.0 lb

## 2019-10-19 DIAGNOSIS — Z0000 Encounter for general adult medical examination without abnormal findings: Secondary | ICD-10-CM

## 2019-10-19 DIAGNOSIS — G250 Essential tremor: Secondary | ICD-10-CM

## 2019-10-19 DIAGNOSIS — M8580 Other specified disorders of bone density and structure, unspecified site: Secondary | ICD-10-CM

## 2019-10-19 DIAGNOSIS — K219 Gastro-esophageal reflux disease without esophagitis: Secondary | ICD-10-CM

## 2019-10-19 DIAGNOSIS — F32A Depression, unspecified: Secondary | ICD-10-CM

## 2019-10-19 DIAGNOSIS — Z87891 Personal history of nicotine dependence: Secondary | ICD-10-CM

## 2019-10-19 DIAGNOSIS — E781 Pure hyperglyceridemia: Secondary | ICD-10-CM

## 2019-10-19 DIAGNOSIS — R413 Other amnesia: Secondary | ICD-10-CM

## 2019-10-19 DIAGNOSIS — E039 Hypothyroidism, unspecified: Secondary | ICD-10-CM

## 2019-10-19 DIAGNOSIS — D7589 Other specified diseases of blood and blood-forming organs: Secondary | ICD-10-CM

## 2019-10-19 LAB — POCT URINALYSIS DIPSTICK
Appearance: NEGATIVE
Bilirubin, UA: NEGATIVE
Blood, UA: NEGATIVE
Glucose, UA: NEGATIVE
Ketones, UA: NEGATIVE
Leukocytes, UA: NEGATIVE
Nitrite, UA: NEGATIVE
Odor: NEGATIVE
Protein, UA: NEGATIVE
Spec Grav, UA: 1.01 (ref 1.010–1.025)
Urobilinogen, UA: 0.2 E.U./dL
pH, UA: 6.5 (ref 5.0–8.0)

## 2019-10-19 MED ORDER — FENOFIBRATE 160 MG PO TABS: 160.0000 mg | ORAL_TABLET | Freq: Every day | ORAL | 1 refills | Status: DC

## 2019-10-29 ENCOUNTER — Ambulatory Visit (INDEPENDENT_AMBULATORY_CARE_PROVIDER_SITE_OTHER): Payer: PPO | Admitting: Internal Medicine

## 2019-10-29 ENCOUNTER — Telehealth: Payer: Self-pay | Admitting: Internal Medicine

## 2019-10-29 ENCOUNTER — Other Ambulatory Visit: Payer: Self-pay

## 2019-10-29 ENCOUNTER — Encounter: Payer: Self-pay | Admitting: Internal Medicine

## 2019-10-29 VITALS — HR 84 | Temp 98.6°F

## 2019-10-29 DIAGNOSIS — J22 Unspecified acute lower respiratory infection: Secondary | ICD-10-CM

## 2019-10-29 DIAGNOSIS — Z20822 Contact with and (suspected) exposure to covid-19: Secondary | ICD-10-CM

## 2019-10-29 MED ORDER — AZITHROMYCIN 250 MG PO TABS: ORAL_TABLET | ORAL | 0 refills | Status: DC

## 2019-10-29 MED ORDER — PREDNISONE 10 MG PO TABS: ORAL_TABLET | ORAL | 0 refills | Status: DC

## 2019-10-29 NOTE — Telephone Encounter (Signed)
Car visit with husband

## 2019-10-29 NOTE — Patient Instructions (Signed)
COVID-19 PCR test is pending.  Take Zithromax Z-PAK 2 p.o. day 1 followed by 1 p.o. days 2 through 5.  Rest and drink plenty of fluids.

## 2019-10-29 NOTE — Telephone Encounter (Signed)
Megan Williams [phone]  Tamanna called to say she has been sick since yesterday, cough, fatigue, runny nose,, headache, stuffy nose, some mucus, fatigue may be from being up and down with husband and normal sinus. Had both COVID vaccines.

## 2019-10-29 NOTE — Progress Notes (Signed)
   Subjective:    Patient ID: Megan Williams, female    DOB: 1945-10-25, 74 y.o.   MRN: [id number]  HPI 74 year old Female seen today along with her husband with history of respiratory infection symptoms.  Has had cough and congestion.  Denies fever chills dysgeusia headache nausea vomiting or diarrhea.  Has had 2 COVID-19 vaccines.  History of osteoporosis and mild cognitive disorder.  Last had respiratory symptoms in December 2020.  At that time was treated with a tapering course of prednisone.  Mainly was having rhinorrhea.  Time spent reviewing records, seeing patient and medical decision making is 20 minutes.  Review of Systems see above     Objective:   Physical Exam Temperature 98.6 degrees orally pulse 84 pulse oximetry 98%.  She is in no acute distress and is not tachypneic.  COVID-19 PCR test obtained via nasal swab.  TMs are clear.  Pharynx is clear.  Neck is supple.  Chest clear to auscultation with the exception of an inspiratory wheeze left lower lobe.       Assessment & Plan:  Acute lower respiratory infection  Plan: Zithromax Z-PAK 2 p.o. day 1 followed by 1 p.o. days 2 through 5.  Rest and drink plenty of fluids.  COVID-19 test results pending.

## 2019-10-29 NOTE — Telephone Encounter (Signed)
scheduled

## 2019-10-31 ENCOUNTER — Encounter: Payer: Self-pay | Admitting: Nurse Practitioner

## 2019-10-31 ENCOUNTER — Telehealth (HOSPITAL_COMMUNITY): Payer: Self-pay | Admitting: Nurse Practitioner

## 2019-10-31 ENCOUNTER — Other Ambulatory Visit (HOSPITAL_COMMUNITY): Payer: Self-pay | Admitting: Nurse Practitioner

## 2019-10-31 DIAGNOSIS — U071 COVID-19: Secondary | ICD-10-CM

## 2019-10-31 LAB — SARS-COV-2 RNA,(COVID-19) QUALITATIVE NAAT: SARS CoV2 RNA: DETECTED — CR

## 2019-10-31 NOTE — Telephone Encounter (Signed)
Called to Discuss with patient about Covid symptoms and the use of regeneron, a monoclonal antibody infusion for those with mild to moderate Covid symptoms and at a high risk of hospitalization.     Pt is qualified for this infusion at the [facility] center due to co-morbid conditions and/or a member of an at-risk group.     Unable to reach pt. Number not valid. Home phone unavailable as well. Sent [employer].   Beckey Rutter, [place], AGNP-C [phone] ([place])

## 2019-10-31 NOTE — Progress Notes (Signed)
I connected by phone with Megan Williams on 10/31/2019 at 6:55 PM to discuss the potential use of an new treatment for mild to moderate COVID-19 viral infection in non-hospitalized patients.  This patient is a 74 y.o. female that meets the FDA criteria for Emergency Use Authorization of casirivimab\imdevimab.  Has a (+) direct SARS-CoV-2 viral test result  Has mild or moderate COVID-19   Is ? 72 years of age and weighs ? 40 kg  Is NOT hospitalized due to COVID-19  Is NOT requiring oxygen therapy or requiring an increase in baseline oxygen flow rate due to COVID-19  Is within 10 days of symptom onset  Has at least one of the high risk factor(s) for progression to severe COVID-19 and/or hospitalization as defined in EUA.  Specific high risk criteria : Older age (>/= 72 yo), hypertension   I have spoken and communicated the following to the patient or parent/caregiver:  1. FDA has authorized the emergency use of casirivimab\imdevimab for the treatment of mild to moderate COVID-19 in adults and pediatric patients with positive results of direct SARS-CoV-2 viral testing who are 77 years of age and older weighing at least 40 kg, and who are at high risk for progressing to severe COVID-19 and/or hospitalization.  2. The significant known and potential risks and benefits of casirivimab\imdevimab, and the extent to which such potential risks and benefits are unknown.  3. Information on available alternative treatments and the risks and benefits of those alternatives, including clinical trials.  4. Patients treated with casirivimab\imdevimab should continue to self-isolate and use infection control measures (e.g., wear mask, isolate, social distance, avoid sharing personal items, clean and disinfect "high touch" surfaces, and frequent handwashing) according to CDC guidelines.   5. The patient or parent/caregiver has the option to accept or refuse casirivimab\imdevimab .  After reviewing this  information with the patient, The patient agreed to proceed with receiving casirivimab\imdevimab infusion and will be provided a copy of the Fact sheet prior to receiving the infusion.Beckey Rutter, [place], AGNP-C [phone] ([place])

## 2019-11-02 ENCOUNTER — Ambulatory Visit (HOSPITAL_COMMUNITY)
Admission: RE | Admit: 2019-11-02 | Discharge: 2019-11-02 | Disposition: A | Discharge status: Home / Self Care | Payer: Medicare Other | Source: Ambulatory Visit | Attending: Cardiology | Admitting: Cardiology

## 2019-11-02 DIAGNOSIS — Z23 Encounter for immunization: Secondary | ICD-10-CM | POA: Diagnosis not present

## 2019-11-02 DIAGNOSIS — U071 COVID-19: Secondary | ICD-10-CM | POA: Insufficient documentation

## 2019-11-02 MED ORDER — SODIUM CHLORIDE 0.9 % IV SOLN: INTRAVENOUS | Status: DC | PRN

## 2019-11-02 MED ORDER — EPINEPHRINE 0.3 MG/0.3ML IJ SOAJ: 0.3000 mg | Freq: Once | INTRAMUSCULAR | Status: DC | PRN

## 2019-11-02 MED ORDER — SODIUM CHLORIDE 0.9 % IV SOLN
1200.0000 mg | Freq: Once | INTRAVENOUS | Status: AC
  Administered 2019-11-02: 1200 mg via INTRAVENOUS

## 2019-11-02 MED ORDER — METHYLPREDNISOLONE SODIUM SUCC 125 MG IJ SOLR: 125.0000 mg | Freq: Once | INTRAMUSCULAR | Status: DC | PRN

## 2019-11-02 MED ORDER — ALBUTEROL SULFATE HFA 108 (90 BASE) MCG/ACT IN AERS: 2.0000 | INHALATION_SPRAY | Freq: Once | RESPIRATORY_TRACT | Status: DC | PRN

## 2019-11-02 MED ORDER — DIPHENHYDRAMINE HCL 50 MG/ML IJ SOLN: 50.0000 mg | Freq: Once | INTRAMUSCULAR | Status: DC | PRN

## 2019-11-02 MED ORDER — FAMOTIDINE IN NACL 20-0.9 MG/50ML-% IV SOLN: 20.0000 mg | Freq: Once | INTRAVENOUS | Status: DC | PRN

## 2019-11-02 NOTE — Discharge Instructions (Signed)

## 2019-11-02 NOTE — Progress Notes (Signed)
  Diagnosis: COVID-19  Physician: Dr. Asencion Noble  Procedure: [facility] Med: casirivimab\imdevimab infusion - Provided patient with casirivimab\imdevimab fact sheet for patients, parents and caregivers prior to infusion.  Complications: No immediate complications noted.  Discharge: Discharged home   Randa Evens [facility] 11/02/2019

## 2019-11-06 ENCOUNTER — Telehealth: Payer: Self-pay | Admitting: Internal Medicine

## 2019-11-06 ENCOUNTER — Other Ambulatory Visit: Payer: Self-pay

## 2019-11-06 ENCOUNTER — Telehealth (INDEPENDENT_AMBULATORY_CARE_PROVIDER_SITE_OTHER): Payer: PPO | Admitting: Internal Medicine

## 2019-11-06 DIAGNOSIS — U071 COVID-19: Secondary | ICD-10-CM

## 2019-11-06 MED ORDER — PREDNISONE 10 MG PO TABS
ORAL_TABLET | ORAL | 0 refills | Status: DC
Start: 2019-11-06 — End: 2019-12-07

## 2019-11-06 MED ORDER — AZITHROMYCIN 250 MG PO TABS
ORAL_TABLET | ORAL | 0 refills | Status: DC
Start: 2019-11-06 — End: 2019-12-07

## 2019-11-06 NOTE — Telephone Encounter (Signed)
Pt said that her and her husband tested positive for covid last week and that they both had the infusion Friday. She wanted to know how long she was contagious for and to let you know she has now lost her taste and smell

## 2019-11-06 NOTE — Telephone Encounter (Signed)
Set up phone call

## 2019-11-09 ENCOUNTER — Telehealth (INDEPENDENT_AMBULATORY_CARE_PROVIDER_SITE_OTHER): Payer: PPO | Admitting: Internal Medicine

## 2019-11-09 ENCOUNTER — Encounter: Payer: Self-pay | Admitting: Internal Medicine

## 2019-11-09 DIAGNOSIS — U071 COVID-19: Secondary | ICD-10-CM

## 2019-11-09 NOTE — Progress Notes (Addendum)
   Subjective:    Patient ID: Megan Williams, female    DOB: Jul 09, 1945, 74 y.o.   MRN: [id number]  HPI 74 year old Female previously vaccinated for Covid-19 tested positive here August 30. Had infusion therapy at [facility] but still with malaise, dysgeusia, fatigue, and cough.  Patient was initially treated with Zithromax Z-PAK on August 30.  She and her husband are monitoring pulse oximetry at home and it is stable.  Husband also tested positive for COVID-19 and was vaccinated.  Patient has hypertriglyceridemia, osteoporosis, anxiety/depression, mild memory loss and GE reflux.  History of smoking cigarettes.  Review of Systems see above-no fever or chills     Objective:   Physical Exam  Reports being afebrile.  Reports stable pulse oximetry.  Looks fatigued      Assessment & Plan:  Protracted COVID-19 course  Plan: Repeat Zithromax Z-PAK.  Take prednisone 10 mg in tapering course as directed starting with 6 tablets day 1 and decreasing by 10 mg daily i.e. 6-5-4-3-2-1 taper.  We will follow-up with her at the end of the week.  Call if symptoms worsen in between.

## 2019-11-09 NOTE — Patient Instructions (Signed)
I will call you later in the week to see how you are doing.  Call if symptoms worsen in the meantime.  Take Zithromax Z-PAK as directed in tapering course of prednisone as directed.  Rest and drink plenty of fluids and continue to monitor pulse oximetry.

## 2019-11-09 NOTE — Telephone Encounter (Addendum)
Called patient to follow up on Covid-19 infection.  Patient consented to visit in this format today.  She is at home and I am in my office.  She is identified using 2 identifiers as Megan Williams, a patient in this practice.  Patient and her husband were diagnosed with COVID-19 on August 30. Her COVID-19 PCR test obtained on August 30 was positive. She was prescribed a Zithromax Z-PAK at the time of her visit.  Subsequently, patient received Regeneron infusion on September 3 at the [facility].  Today she reports, her pulse oximetry today is 97% on room air.  She is feeling much better.  Dysgeusia is resolving.  Her husband has COVID-19 as well and he is much better she says.  I did not speak with him personally today.  He was away from the house at this time.  Both she and her husband are smokers.  They have been carefully monitoring pulse oximetry readings.  Reports no fever or shortness of breath.  Beginning to get energy back.  Patient has been quarantining at home and will continue to do so a few more days.  Since patient still has some respiratory congestion I have prescribed another Zithromax Z-PAK and a tapering course of steroids consisting of prednisone 10 mg tablets starting with 6 tablets on day 1 and decreasing by 1 tablet daily i.e. 6-5-4-3-2-1 taper.  Patient thanked me for calling her.  Time spent with patient today on telephone is 15 minutes.

## 2019-11-12 ENCOUNTER — Other Ambulatory Visit: Payer: Self-pay | Admitting: Internal Medicine

## 2019-11-13 NOTE — Addendum Note (Signed)
Addended by: Elby Showers on: 11/13/2019 08:54 PM   Modules accepted: Level of Service

## 2019-11-18 NOTE — Patient Instructions (Addendum)
Advised patient to quit smoking.  Continue current medications and follow-up in 6 months.  Had third Covid vaccine booster when available.  Continue current medications.

## 2019-11-18 NOTE — Progress Notes (Signed)
Subjective:    Patient ID: Megan Williams, female    DOB: 16-Jun-1945, 74 y.o.   MRN: [id number]  HPI 74 year old female in today for Medicare wellness, health maintenance exam and evaluation of medical issues.  She has a history of hypothyroidism.  She has a history of constipation.  She has a history of mild memory loss.  History of T2 compression fracture in 2017 after a fall in the shower.    In 2012 she had an L4-L5 decompression stabilization by Dr. Ellene Route.  She also had cervical spine 3 level fusion C4-C7 by Dr. Ellene Route in 2005.    In 2018 she fell onto her knees and had increasing pain in left buttock.  In December 2019 she had decompression L3-L4 by Dr. Ellene Route.    She does have osteopenia and sees Dr. Layne Benton for Prolia injections.    She had joint reconstruction of right thumb 2008 by Dr. Daylene Katayama.  She had normal colonoscopy 2018 by Dr. Watt Climes.  She has a history of memory disturbance treated with Namenda.  History of depression.  She is on Effexor.  History of GE reflux treated with PPI.  Sees Dr. Earney Navy in [place] for overactive bladder.  Continues to smoke although has tried Chantix.  Has been diagnosed tremor by neurologist and mild cognitive impairment.  Sees Noemi Chapel for anxiety and depression.  Had thyroidectomy 1968, tonsillectomy 1963, hysterectomy without oophorectomy 1979.  Cholecystectomy 1993.  History of low B12 level in 2016 with a low 200 range.  This is improved with oral B12 supplement.  Family history is unknown since she is adopted.  Social history: She is married.  2 adult children.  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  Labs reviewed.  Her B12 level is greater than 2000 and she continues to take oral supplement.  C-Met is normal.  CBC is normal except for MCV is 102.4.  Triglycerides are elevated at 273 but  total cholesterol is 200 and LDL cholesterol 95.  TSH is 3.89.  Review of Systems see above     Objective:   Physical Exam Blood pressure 120/70 pulse 63 regular pulse oximetry 97% weight 124 pounds height 5 feet 2 inches BMI 22.68 Neck is supple without JVD thyromegaly or carotid bruits.  Chest is clear to auscultation.  Breast without masses.  Cardiac exam regular rate and rhythm.  Bimanual is normal.  Abdomen soft nondistended without hepatosplenomegaly masses or tenderness.  No lower extremity edema.  Neuro intact without gross focal deficits except for mild memory loss which continues to be noted.  She has a mild tremor.      Assessment & Plan:  Essential tremor-followed by neurologist  Mild memory loss-treated with Namenda  History of lumbar spinal stenosis status post multiple surgeries.  History of smoking  Osteopenia treated by Dr. Layne Benton with Prolia.  Last T score August 2020 left femoral neck -1.7 and stable  Hypothyroidism treated with thyroid replacement medication and TSH is within normal limits  Hypertriglyceridemia-does not want to be on statin therapy but is on fenofibrate 160 mg daily.  History of depression and anxiety treated with Wellbutrin and Tranxene  GE reflux treated with Protonix  Essential tremor treated with Inderal 40 mg twice daily by neurology  History of irritable bowel treated with Bentyl  Plan: Continue current medications and follow-up  in 6 months.  Continue to asked patient to quit smoking.  Subjective:   Patient presents for Medicare Annual/Subsequent preventive examination.  Review Past Medical/Family/Social: See above   Risk Factors  Current exercise habits: Active about her home Dietary issues discussed: Low-fat low carbohydrate  Cardiac risk factors:  Depression Screen  (Note: if answer to either of the following is "Yes", a more complete depression screening is indicated)   Over the past two weeks, have you felt down,  depressed or hopeless? No  Over the past two weeks, have you felt little interest or pleasure in doing things? No Have you lost interest or pleasure in daily life? No Do you often feel hopeless? No Do you cry easily over simple problems? No   Activities of Daily Living  In your present state of health, do you have any difficulty performing the following activities?:   Driving? No  Managing money? No  Feeding yourself? No  Getting from bed to chair? No  Climbing a flight of stairs? No  Preparing food and eating?: No  Bathing or showering? No  Getting dressed: No  Getting to the toilet? No  Using the toilet:No  Moving around from place to place: No  In the past year have you fallen or had a near fall?:No-tripped and had a near fall Are you sexually active? No  Do you have more than one partner? No   Hearing Difficulties: No  Do you often ask people to speak up or repeat themselves?  Yes Do you experience ringing or noises in your ears? No  Do you have difficulty understanding soft or whispered voices?  Yes Do you feel that you have a problem with memory? No Do you often misplace items? No    Home Safety:  Do you have a smoke alarm at your residence? Yes Do you have grab bars in the bathroom?  Yes Do you have throw rugs in your house?  None   Cognitive Testing  Alert? Yes Normal Appearance?Yes  Oriented to person? Yes Place? Yes  Time? Yes  Recall of three objects?  No Can perform simple calculations?  Not tested Displays appropriate judgment?Yes  Can read the correct time from a watch face?Yes   List the Names of Other Physician/Practitioners you currently use:  See referral list for the physicians patient is currently seeing.  Neurologist  Noemi Chapel   Review of Systems: See above  Objective:     General appearance: Appears stated age and thin Head: Normocephalic, without obvious abnormality, atraumatic  Eyes: conj clear, EOMi PEERLA  Ears: normal TM's  and external ear canals both ears  Nose: Nares normal. Septum midline. Mucosa normal. No drainage or sinus tenderness.  Throat: lips, mucosa, and tongue normal; teeth and gums normal  Neck: no adenopathy, no carotid bruit, no JVD, supple, symmetrical, trachea midline and thyroid not enlarged, symmetric, no tenderness/mass/nodules  No CVA tenderness.  Lungs: clear to auscultation bilaterally  Breasts: normal appearance, no masses or tenderness Heart: regular rate and rhythm, S1, S2 normal, no murmur, click, rub or gallop  Abdomen: soft, non-tender; bowel sounds normal; no masses, no organomegaly  Musculoskeletal: ROM normal in all joints, no crepitus, no deformity, Normal muscle strengthen. Back  is symmetric, no curvature. Skin: Skin color, texture, turgor normal. No rashes or lesions  Lymph nodes: Cervical, supraclavicular, and axillary nodes normal.  Neurologic: CN 2 -12 Normal, Normal symmetric reflexes. Normal coordination and gait  Psych: Alert & Oriented x 3, Mood appear stable.  Assessment:    Annual wellness medicare exam   Plan:    During the course of the visit the patient was educated and counseled about appropriate screening and preventive services including:   Have Covid booster when available.  Tetanus immunization up-to-date.  Has had 2 COVID-19 immunizations.  Had flu vaccine this fall.  Mammogram and colonoscopy up-to-date.    Patient Instructions (the written plan) was given to the patient.  Medicare Attestation  I have personally reviewed:  The patient's medical and social history  Their use of alcohol, tobacco or illicit drugs  Their current medications and supplements  The patient's functional ability including ADLs,fall risks, home safety risks, cognitive, and hearing and visual impairment  Diet and physical activities  Evidence for depression or mood disorders  The patient's weight, height, BMI, and visual acuity have been recorded in the chart. I have  made referrals, counseling, and provided education to the patient based on review of the above and I have provided the patient with a written personalized care plan for preventive services.

## 2019-11-19 ENCOUNTER — Telehealth: Payer: Self-pay | Admitting: Internal Medicine

## 2019-11-19 NOTE — Telephone Encounter (Signed)
Megan Williams [phone]  Adriene called to say she woke up with blister on her tongue this morning and white stuff, she also had this on her genitalia last week and took Valtrex and got better in a few days.

## 2019-11-19 NOTE — Telephone Encounter (Signed)
These 2 are not related. This may be an apthous ulcer. Can see tomorrow.

## 2019-11-19 NOTE — Telephone Encounter (Signed)
Scheduled

## 2019-11-20 ENCOUNTER — Other Ambulatory Visit: Payer: Self-pay

## 2019-11-20 ENCOUNTER — Ambulatory Visit (INDEPENDENT_AMBULATORY_CARE_PROVIDER_SITE_OTHER): Payer: PPO | Admitting: Internal Medicine

## 2019-11-20 ENCOUNTER — Encounter: Payer: Self-pay | Admitting: Internal Medicine

## 2019-11-20 VITALS — HR 78 | Temp 97.7°F

## 2019-11-20 DIAGNOSIS — U071 COVID-19: Secondary | ICD-10-CM | POA: Diagnosis not present

## 2019-11-20 DIAGNOSIS — K120 Recurrent oral aphthae: Secondary | ICD-10-CM | POA: Diagnosis not present

## 2019-11-20 DIAGNOSIS — B009 Herpesviral infection, unspecified: Secondary | ICD-10-CM | POA: Diagnosis not present

## 2019-11-20 DIAGNOSIS — A6004 Herpesviral vulvovaginitis: Secondary | ICD-10-CM

## 2019-11-20 MED ORDER — VALACYCLOVIR HCL 500 MG PO TABS: ORAL_TABLET | ORAL | 5 refills | Status: DC

## 2019-11-20 MED ORDER — TERCONAZOLE 0.4 % VA CREA: 1.0000 | TOPICAL_CREAM | Freq: Every day | VAGINAL | 0 refills | Status: DC

## 2019-11-20 MED ORDER — CLOTRIMAZOLE-BETAMETHASONE 1-0.05 % EX CREA: 1.0000 "application " | TOPICAL_CREAM | Freq: Two times a day (BID) | CUTANEOUS | 0 refills | Status: DC

## 2019-11-20 NOTE — Patient Instructions (Signed)
Lotrisone cream to genital area twice daily for 5 to 7 days.  Sitz baths.  Valtrex 500 mg twice daily for 5 days.  Magic mouthwash 2 teaspoons p.o. swish do not swallow 4 times a day until mouth lesion resolves.

## 2019-11-24 NOTE — Progress Notes (Signed)
   Subjective:    Patient ID: Megan Williams, female    DOB: [birth year], 74 y.o.   MRN: [id number]  HPI 75 year old Female seen today for a couple of reasons.  She has an outbreak of HSV type II and also has aphthous ulcer on her tongue.  She was diagnosed with COVID-19 on August 30.  Both she and her husband had this infection.  They both received COVID-19 immunizations earlier this year.  Other than church, they are not sure where they could have been exposed to this infection.  They were both weak and washed out a bit.  She says she has taken longer to get over it but that her husband seemed to recover more quickly and has been driving.  She has a remote history of HSV type II treated with Valtrex but not any recent outbreaks.  Tongue ulcer is painful.   Review of Systems see above-she has a history of mild memory loss treated with Namenda.  History of recurrent lumbar pain and has had several back surgeries.  She just had a Medicare wellness exam in late August.  She has a history of hypothyroidism.  She has no cough or dysgeusia.     Objective:   Physical Exam She is afebrile.  Pulse 72.  Pulse oximetry 97%  She has what appears to be an aphthous ulcer on her tongue with a white eschar.  This is painful.  Genital lesion noted on vulva consistent with HSV type II  Her chest is clear.     Assessment & Plan:  Aphthous ulcer of tongue  Outbreak of HSV type II  Plan: [place] Magic mouthwash 2 teaspoons to swish but do not swallow 4 times a day for tongue ulcer.  Valtrex 500 mg by mouth twice daily for 5 days.  May also apply Lotrisone cream to genital area twice daily for 5 to 7 days and take sitz baths.  Patient is anxious about her husband who had recent syncopal episode.  She is also concerned about tongue ulcer and genital lesion today.  30 minutes spent with patient today.

## 2019-12-07 ENCOUNTER — Other Ambulatory Visit: Payer: Self-pay

## 2019-12-07 ENCOUNTER — Encounter: Payer: Self-pay | Admitting: Internal Medicine

## 2019-12-07 ENCOUNTER — Ambulatory Visit (INDEPENDENT_AMBULATORY_CARE_PROVIDER_SITE_OTHER): Payer: PPO | Admitting: Internal Medicine

## 2019-12-07 VITALS — BP 110/80 | HR 63 | Temp 98.0°F | Ht 62.0 in | Wt 127.0 lb

## 2019-12-07 DIAGNOSIS — M5441 Lumbago with sciatica, right side: Secondary | ICD-10-CM

## 2019-12-07 DIAGNOSIS — W1812XA Fall from or off toilet with subsequent striking against object, initial encounter: Secondary | ICD-10-CM

## 2019-12-07 DIAGNOSIS — S0511XA Contusion of eyeball and orbital tissues, right eye, initial encounter: Secondary | ICD-10-CM

## 2019-12-07 MED ORDER — HYDROCODONE-ACETAMINOPHEN 5-325 MG PO TABS
ORAL_TABLET | ORAL | 0 refills | Status: DC
Start: 2019-12-07 — End: 2020-05-20

## 2019-12-07 NOTE — Progress Notes (Signed)
   Subjective:    Patient ID: Megan Williams, female    DOB: 22-Apr-1945, 74 y.o.   MRN: [id number]  HPI 74 year old Female with mild memory loss.  Was diagnosed with COVID-19 variant infection late August and was seen at [facility] infusion clinic September 3.  Received casirivimab / imdevimab (Regen-Cov) through infusion clinic.  Had been vaccinated for COVID-19 in February and March of this year.  Husband came down with same illness.  Patient was in the bathroom sitting on toilet a couple of days ago.  She got up, somehow lost her balance and fell striking her right face and right back.  She is here to be seen for right periorbital hematoma.  Her husband was here for an appointment and we worked her in today at his request.  She tried contacting Dr. Clarice Pole office regarding back pain but has not heard back today.  Having fairly severe right lower back and buttock pain.  She is able to walk.  She is wearing a back brace today.  History of osteopenia and sees Dr. Layne Benton for Prolia injections.  History of decompression L3-L4 in December 2019 by Dr. Ellene Route.  In 2012 she had an L4-L5 decompression stabilization by Dr. Ellene Route.      Review of Systems denies significant radiculopathy just right paralumbar and right buttock back pain.  Walking slowly.  Had no loss of consciousness she says with  recent fall.     Objective:   Physical Exam Blood pressure 110/80 pulse 63 temperature 98 degrees orally pulse oximetry 97% weight 127 pounds height 5 feet 10 inches  She has a right periorbital hematoma.  No scleral hemorrhage present.  Extraocular movements are full.  She is alert.  Affect appears to be normal.  Cranial nerves II through XII are grossly intact.  She moves all 4 extremities.  She is walking slowly due to right sided back pain.  Her muscle strength appears to be normal in her legs.  She has some point tenderness in her right paralumbar and buttock region.       Assessment & Plan:    Right periorbital hematoma-no treatment needed at this time.  Will take a few weeks to resolve completely.  Does not seem to have a concussion or CNS symptoms.  History of mild memory disorder  Back pain secondary to fall-prescribed hydrocodone APAP 5/325 1 every 8 hours as needed for pain #15.  Will be seeing Dr. Ellene Route soon.

## 2019-12-18 ENCOUNTER — Other Ambulatory Visit: Payer: PPO | Admitting: Internal Medicine

## 2019-12-18 ENCOUNTER — Other Ambulatory Visit: Payer: Self-pay

## 2019-12-18 DIAGNOSIS — E781 Pure hyperglyceridemia: Secondary | ICD-10-CM | POA: Diagnosis not present

## 2019-12-19 LAB — LIPID PANEL
Cholesterol: 169 mg/dL (ref <200)
HDL: 55 mg/dL (ref >=50)
LDL Cholesterol (Calc): 93 mg/dL (calc)
Non-HDL Cholesterol (Calc): 114 mg/dL (calc) (ref <130)
Total CHOL/HDL Ratio: 3.1 (calc) (ref <5.0)
Triglycerides: 118 mg/dL (ref <150)

## 2019-12-25 DIAGNOSIS — F429 Obsessive-compulsive disorder, unspecified: Secondary | ICD-10-CM | POA: Diagnosis not present

## 2019-12-25 DIAGNOSIS — N319 Neuromuscular dysfunction of bladder, unspecified: Secondary | ICD-10-CM | POA: Diagnosis not present

## 2019-12-25 DIAGNOSIS — N3281 Overactive bladder: Secondary | ICD-10-CM | POA: Diagnosis not present

## 2019-12-25 DIAGNOSIS — F3342 Major depressive disorder, recurrent, in full remission: Secondary | ICD-10-CM | POA: Diagnosis not present

## 2019-12-25 DIAGNOSIS — G4700 Insomnia, unspecified: Secondary | ICD-10-CM | POA: Diagnosis not present

## 2019-12-26 DIAGNOSIS — M48062 Spinal stenosis, lumbar region with neurogenic claudication: Secondary | ICD-10-CM | POA: Diagnosis not present

## 2019-12-26 DIAGNOSIS — M47816 Spondylosis without myelopathy or radiculopathy, lumbar region: Secondary | ICD-10-CM | POA: Diagnosis not present

## 2019-12-26 DIAGNOSIS — I10 Essential (primary) hypertension: Secondary | ICD-10-CM | POA: Diagnosis not present

## 2019-12-26 DIAGNOSIS — M5416 Radiculopathy, lumbar region: Secondary | ICD-10-CM | POA: Diagnosis not present

## 2019-12-29 NOTE — Patient Instructions (Signed)
It does not appear that you have a concussion.  You do have a periorbital hematoma which may take 3 or 4 weeks to completely resolve.  Take hydrocodone APAP 5/325 sparingly for back pain.  Rest at home for 24 to 48 hours and take it easy.  Follow-up with Dr. Ellene Route for back pain.

## 2020-01-09 ENCOUNTER — Other Ambulatory Visit: Payer: Self-pay

## 2020-01-09 ENCOUNTER — Ambulatory Visit (INDEPENDENT_AMBULATORY_CARE_PROVIDER_SITE_OTHER): Payer: PPO | Admitting: Internal Medicine

## 2020-01-09 ENCOUNTER — Encounter: Payer: Self-pay | Admitting: Internal Medicine

## 2020-01-09 VITALS — BP 110/70 | HR 77 | Temp 97.9°F | Ht 62.0 in | Wt 127.0 lb

## 2020-01-09 DIAGNOSIS — Z23 Encounter for immunization: Secondary | ICD-10-CM | POA: Diagnosis not present

## 2020-01-09 NOTE — Progress Notes (Signed)
Flu vaccine per CMA 

## 2020-01-09 NOTE — Patient Instructions (Signed)
Received flu vaccine per CMA

## 2020-01-10 DIAGNOSIS — M545 Low back pain, unspecified: Secondary | ICD-10-CM | POA: Diagnosis not present

## 2020-01-10 DIAGNOSIS — M5416 Radiculopathy, lumbar region: Secondary | ICD-10-CM | POA: Diagnosis not present

## 2020-01-22 DIAGNOSIS — M810 Age-related osteoporosis without current pathological fracture: Secondary | ICD-10-CM | POA: Diagnosis not present

## 2020-01-22 DIAGNOSIS — M545 Low back pain, unspecified: Secondary | ICD-10-CM | POA: Diagnosis not present

## 2020-01-23 DIAGNOSIS — M48062 Spinal stenosis, lumbar region with neurogenic claudication: Secondary | ICD-10-CM | POA: Diagnosis not present

## 2020-01-31 DIAGNOSIS — M5451 Vertebrogenic low back pain: Secondary | ICD-10-CM | POA: Diagnosis not present

## 2020-01-31 DIAGNOSIS — M48062 Spinal stenosis, lumbar region with neurogenic claudication: Secondary | ICD-10-CM | POA: Diagnosis not present

## 2020-02-02 DIAGNOSIS — F3342 Major depressive disorder, recurrent, in full remission: Secondary | ICD-10-CM | POA: Diagnosis not present

## 2020-02-02 DIAGNOSIS — G4700 Insomnia, unspecified: Secondary | ICD-10-CM | POA: Diagnosis not present

## 2020-02-02 DIAGNOSIS — F429 Obsessive-compulsive disorder, unspecified: Secondary | ICD-10-CM | POA: Diagnosis not present

## 2020-02-05 DIAGNOSIS — M5116 Intervertebral disc disorders with radiculopathy, lumbar region: Secondary | ICD-10-CM | POA: Diagnosis not present

## 2020-02-05 DIAGNOSIS — M5416 Radiculopathy, lumbar region: Secondary | ICD-10-CM | POA: Diagnosis not present

## 2020-02-06 DIAGNOSIS — M5451 Vertebrogenic low back pain: Secondary | ICD-10-CM | POA: Diagnosis not present

## 2020-02-06 DIAGNOSIS — M48062 Spinal stenosis, lumbar region with neurogenic claudication: Secondary | ICD-10-CM | POA: Diagnosis not present

## 2020-02-08 DIAGNOSIS — M48062 Spinal stenosis, lumbar region with neurogenic claudication: Secondary | ICD-10-CM | POA: Diagnosis not present

## 2020-02-08 DIAGNOSIS — M5451 Vertebrogenic low back pain: Secondary | ICD-10-CM | POA: Diagnosis not present

## 2020-02-11 DIAGNOSIS — M48062 Spinal stenosis, lumbar region with neurogenic claudication: Secondary | ICD-10-CM | POA: Diagnosis not present

## 2020-02-11 DIAGNOSIS — M5451 Vertebrogenic low back pain: Secondary | ICD-10-CM | POA: Diagnosis not present

## 2020-02-14 DIAGNOSIS — M48062 Spinal stenosis, lumbar region with neurogenic claudication: Secondary | ICD-10-CM | POA: Diagnosis not present

## 2020-02-14 DIAGNOSIS — M5451 Vertebrogenic low back pain: Secondary | ICD-10-CM | POA: Diagnosis not present

## 2020-02-19 DIAGNOSIS — M5451 Vertebrogenic low back pain: Secondary | ICD-10-CM | POA: Diagnosis not present

## 2020-02-19 DIAGNOSIS — M48062 Spinal stenosis, lumbar region with neurogenic claudication: Secondary | ICD-10-CM | POA: Diagnosis not present

## 2020-02-21 DIAGNOSIS — M5451 Vertebrogenic low back pain: Secondary | ICD-10-CM | POA: Diagnosis not present

## 2020-02-21 DIAGNOSIS — M48062 Spinal stenosis, lumbar region with neurogenic claudication: Secondary | ICD-10-CM | POA: Diagnosis not present

## 2020-02-26 DIAGNOSIS — M5451 Vertebrogenic low back pain: Secondary | ICD-10-CM | POA: Diagnosis not present

## 2020-02-26 DIAGNOSIS — M48062 Spinal stenosis, lumbar region with neurogenic claudication: Secondary | ICD-10-CM | POA: Diagnosis not present

## 2020-03-04 DIAGNOSIS — M5451 Vertebrogenic low back pain: Secondary | ICD-10-CM | POA: Diagnosis not present

## 2020-03-04 DIAGNOSIS — M48062 Spinal stenosis, lumbar region with neurogenic claudication: Secondary | ICD-10-CM | POA: Diagnosis not present

## 2020-03-11 DIAGNOSIS — M48062 Spinal stenosis, lumbar region with neurogenic claudication: Secondary | ICD-10-CM | POA: Diagnosis not present

## 2020-03-11 DIAGNOSIS — M5451 Vertebrogenic low back pain: Secondary | ICD-10-CM | POA: Diagnosis not present

## 2020-03-12 DIAGNOSIS — N3281 Overactive bladder: Secondary | ICD-10-CM | POA: Diagnosis not present

## 2020-03-12 DIAGNOSIS — N319 Neuromuscular dysfunction of bladder, unspecified: Secondary | ICD-10-CM | POA: Diagnosis not present

## 2020-03-13 DIAGNOSIS — M48062 Spinal stenosis, lumbar region with neurogenic claudication: Secondary | ICD-10-CM | POA: Diagnosis not present

## 2020-03-13 DIAGNOSIS — M5451 Vertebrogenic low back pain: Secondary | ICD-10-CM | POA: Diagnosis not present

## 2020-03-26 DIAGNOSIS — M48062 Spinal stenosis, lumbar region with neurogenic claudication: Secondary | ICD-10-CM | POA: Diagnosis not present

## 2020-03-27 ENCOUNTER — Other Ambulatory Visit: Payer: Self-pay | Admitting: Neurological Surgery

## 2020-04-04 ENCOUNTER — Telehealth: Payer: Self-pay | Admitting: Internal Medicine

## 2020-04-04 NOTE — Telephone Encounter (Signed)
Have spoken with Dr. Amalia Hailey as  requested. He will approve surgery.

## 2020-04-04 NOTE — Telephone Encounter (Signed)
Dr Lynder Parents [phone] Medical Director - [employer]  Dr Amalia Hailey would like for you to call him at your convenience to discuss a complex spin surgery that patient has requested to be approved to be preformed by Dr Ellene Route. He said that as part of the approval process he is to talk with the PCP about the surgery.

## 2020-04-10 ENCOUNTER — Other Ambulatory Visit: Payer: Self-pay | Admitting: Internal Medicine

## 2020-04-25 NOTE — Progress Notes (Signed)
Surgical Instructions    Your procedure is scheduled on 04/30/20.  Report to [facility] Main Entrance "A" at 05:30 A.M., then check in with the Admitting office.  Call this number if you have problems the morning of surgery:  [phone]   If you have any questions prior to your surgery date call [phone]: Open Monday-Friday 8am-4pm    Remember:  Do not eat or drink after midnight the night before your surgery     Take these medicines the morning of surgery with A SIP OF WATER  buPROPion (WELLBUTRIN XL)  cyclobenzaprine (FLEXERIL) if needed diphenhydrAMINE (BENADRYL)  If needed estradiol (ESTRACE)  fexofenadine (ALLEGRA) fluticasone (FLONASE) HYDROcodone-acetaminophen (NORCO)  If needed levothyroxine (SYNTHROID) memantine (NAMENDA) oxybutynin (DITROPAN) pantoprazole (PROTONIX) propranolol (INDERAL) venlafaxine XR (EFFEXOR-XR)  Restasis eye drops  As of today, STOP taking any Aspirin (unless otherwise instructed by your surgeon) Aleve, Naproxen, Ibuprofen, Motrin, Advil, Goody's, BC's, all herbal medications, fish oil, and all vitamins.                     Do not wear jewelry, make up, or nail polish            Do not wear lotions, powders, perfumes/colognes, or deodorant.            Do not shave 48 hours prior to surgery.              Do not bring valuables to the hospital.            [facility] is not responsible for any belongings or valuables.  Do NOT Smoke (Tobacco/Vaping) or drink Alcohol 24 hours prior to your procedure If you use a CPAP at night, you may bring all equipment for your overnight stay.   Contacts, glasses, dentures or bridgework may not be worn into surgery, please bring cases for these belongings   For patients admitted to the hospital, discharge time will be determined by your treatment team.   Patients discharged the day of surgery will not be allowed to drive home, and someone needs to stay with them for 24 hours.    Special instructions:    [place]- Preparing For Surgery  Before surgery, you can play an important role. Because skin is not sterile, your skin needs to be as free of germs as possible. You can reduce the number of germs on your skin by washing with CHG (chlorahexidine gluconate) Soap before surgery.  CHG is an antiseptic cleaner which kills germs and bonds with the skin to continue killing germs even after washing.    Oral Hygiene is also important to reduce your risk of infection.  Remember - BRUSH YOUR TEETH THE MORNING OF SURGERY WITH YOUR REGULAR TOOTHPASTE  Please do not use if you have an allergy to CHG or antibacterial soaps. If your skin becomes reddened/irritated stop using the CHG.  Do not shave (including legs and underarms) for at least 48 hours prior to first CHG shower. It is OK to shave your face.  Please follow these instructions carefully.   1. Shower the NIGHT BEFORE SURGERY and the MORNING OF SURGERY  2. If you chose to wash your hair, wash your hair first as usual with your normal shampoo.  3. After you shampoo, rinse your hair and body thoroughly to remove the shampoo.  4. Wash Face and genitals (private parts) with your normal soap.   5.  Shower the NIGHT BEFORE SURGERY and the MORNING OF SURGERY with CHG  Soap.   6. Use CHG Soap as you would any other liquid soap. You can apply CHG directly to the skin and wash gently with a scrungie or a clean washcloth.   7. Apply the CHG Soap to your body ONLY FROM THE NECK DOWN.  Do not use on open wounds or open sores. Avoid contact with your eyes, ears, mouth and genitals (private parts). Wash Face and genitals (private parts)  with your normal soap.   8. Wash thoroughly, paying special attention to the area where your surgery will be performed.  9. Thoroughly rinse your body with warm water from the neck down.  10. DO NOT shower/wash with your normal soap after using and rinsing off the CHG Soap.  11. Pat yourself dry with a CLEAN  TOWEL.  12. Wear CLEAN PAJAMAS to bed the night before surgery  13. Place CLEAN SHEETS on your bed the night before your surgery  14. DO NOT SLEEP WITH PETS.   Day of Surgery: Take a shower.  Wear Clean/Comfortable clothing the morning of surgery Do not apply any deodorants/lotions.   Remember to brush your teeth WITH YOUR REGULAR TOOTHPASTE.   Please read over the following fact sheets that you were given.

## 2020-04-28 ENCOUNTER — Other Ambulatory Visit: Payer: Self-pay

## 2020-04-28 ENCOUNTER — Encounter (HOSPITAL_COMMUNITY): Payer: Self-pay

## 2020-04-28 ENCOUNTER — Other Ambulatory Visit (HOSPITAL_COMMUNITY)
Admission: RE | Admit: 2020-04-28 | Discharge: 2020-04-28 | Disposition: A | Discharge status: Home / Self Care | Payer: PPO | Source: Ambulatory Visit | Attending: Neurological Surgery | Admitting: Neurological Surgery

## 2020-04-28 ENCOUNTER — Encounter (HOSPITAL_COMMUNITY)
Admission: RE | Admit: 2020-04-28 | Discharge: 2020-04-28 | Disposition: A | Discharge status: Home / Self Care | Payer: PPO | Source: Ambulatory Visit | Attending: Neurological Surgery | Admitting: Neurological Surgery

## 2020-04-28 DIAGNOSIS — M48062 Spinal stenosis, lumbar region with neurogenic claudication: Secondary | ICD-10-CM | POA: Diagnosis present

## 2020-04-28 DIAGNOSIS — Z20822 Contact with and (suspected) exposure to covid-19: Secondary | ICD-10-CM | POA: Insufficient documentation

## 2020-04-28 DIAGNOSIS — F411 Generalized anxiety disorder: Secondary | ICD-10-CM | POA: Diagnosis not present

## 2020-04-28 DIAGNOSIS — Z981 Arthrodesis status: Secondary | ICD-10-CM | POA: Diagnosis not present

## 2020-04-28 DIAGNOSIS — M48061 Spinal stenosis, lumbar region without neurogenic claudication: Secondary | ICD-10-CM | POA: Diagnosis not present

## 2020-04-28 DIAGNOSIS — M5116 Intervertebral disc disorders with radiculopathy, lumbar region: Secondary | ICD-10-CM | POA: Diagnosis not present

## 2020-04-28 DIAGNOSIS — Z01818 Encounter for other preprocedural examination: Secondary | ICD-10-CM | POA: Insufficient documentation

## 2020-04-28 DIAGNOSIS — G4700 Insomnia, unspecified: Secondary | ICD-10-CM | POA: Diagnosis not present

## 2020-04-28 DIAGNOSIS — E039 Hypothyroidism, unspecified: Secondary | ICD-10-CM | POA: Diagnosis present

## 2020-04-28 DIAGNOSIS — I10 Essential (primary) hypertension: Secondary | ICD-10-CM | POA: Insufficient documentation

## 2020-04-28 DIAGNOSIS — Z90710 Acquired absence of both cervix and uterus: Secondary | ICD-10-CM | POA: Diagnosis not present

## 2020-04-28 DIAGNOSIS — I129 Hypertensive chronic kidney disease with stage 1 through stage 4 chronic kidney disease, or unspecified chronic kidney disease: Secondary | ICD-10-CM | POA: Diagnosis present

## 2020-04-28 DIAGNOSIS — Z01812 Encounter for preprocedural laboratory examination: Secondary | ICD-10-CM | POA: Insufficient documentation

## 2020-04-28 DIAGNOSIS — J439 Emphysema, unspecified: Secondary | ICD-10-CM | POA: Diagnosis present

## 2020-04-28 DIAGNOSIS — M8580 Other specified disorders of bone density and structure, unspecified site: Secondary | ICD-10-CM | POA: Diagnosis present

## 2020-04-28 DIAGNOSIS — E890 Postprocedural hypothyroidism: Secondary | ICD-10-CM | POA: Diagnosis present

## 2020-04-28 DIAGNOSIS — N189 Chronic kidney disease, unspecified: Secondary | ICD-10-CM | POA: Diagnosis present

## 2020-04-28 DIAGNOSIS — F3342 Major depressive disorder, recurrent, in full remission: Secondary | ICD-10-CM | POA: Diagnosis not present

## 2020-04-28 DIAGNOSIS — M4316 Spondylolisthesis, lumbar region: Secondary | ICD-10-CM | POA: Diagnosis present

## 2020-04-28 DIAGNOSIS — M5416 Radiculopathy, lumbar region: Secondary | ICD-10-CM | POA: Diagnosis present

## 2020-04-28 DIAGNOSIS — Z79899 Other long term (current) drug therapy: Secondary | ICD-10-CM | POA: Diagnosis not present

## 2020-04-28 DIAGNOSIS — E559 Vitamin D deficiency, unspecified: Secondary | ICD-10-CM | POA: Diagnosis present

## 2020-04-28 DIAGNOSIS — Z79890 Hormone replacement therapy (postmenopausal): Secondary | ICD-10-CM | POA: Diagnosis not present

## 2020-04-28 DIAGNOSIS — F32A Depression, unspecified: Secondary | ICD-10-CM | POA: Diagnosis present

## 2020-04-28 DIAGNOSIS — F17200 Nicotine dependence, unspecified, uncomplicated: Secondary | ICD-10-CM | POA: Diagnosis present

## 2020-04-28 DIAGNOSIS — F419 Anxiety disorder, unspecified: Secondary | ICD-10-CM | POA: Diagnosis present

## 2020-04-28 DIAGNOSIS — M4726 Other spondylosis with radiculopathy, lumbar region: Secondary | ICD-10-CM | POA: Diagnosis not present

## 2020-04-28 HISTORY — DX: Hypothyroidism, unspecified: E03.9

## 2020-04-28 LAB — CBC
HCT: 41.8 % (ref 36.0–46.0)
Hemoglobin: 14.5 g/dL (ref 12.0–15.0)
MCH: 34.1 pg — ABNORMAL HIGH (ref 26.0–34.0)
MCHC: 34.7 g/dL (ref 30.0–36.0)
MCV: 98.4 fL (ref 80.0–100.0)
Platelets: 268 10*3/uL (ref 150–400)
RBC: 4.25 MIL/uL (ref 3.87–5.11)
RDW: 13.9 % (ref 11.5–15.5)
WBC: 8.5 10*3/uL (ref 4.0–10.5)
nRBC: 0 % (ref 0.0–0.2)

## 2020-04-28 LAB — TYPE AND SCREEN
ABO/RH(D): O POS
Antibody Screen: NEGATIVE

## 2020-04-28 LAB — BASIC METABOLIC PANEL
Anion gap: 7 (ref 5–15)
BUN: 18 mg/dL (ref 8–23)
CO2: 26 mmol/L (ref 22–32)
Calcium: 9.1 mg/dL (ref 8.9–10.3)
Chloride: 104 mmol/L (ref 98–111)
Creatinine, Ser: 1.32 mg/dL — ABNORMAL HIGH (ref 0.44–1.00)
GFR, Estimated: 43 mL/min — ABNORMAL LOW (ref >60)
Glucose, Bld: 91 mg/dL (ref 70–99)
Potassium: 3.9 mmol/L (ref 3.5–5.1)
Sodium: 137 mmol/L (ref 135–145)

## 2020-04-28 LAB — SURGICAL PCR SCREEN
MRSA, PCR: NEGATIVE
Staphylococcus aureus: NEGATIVE

## 2020-04-28 NOTE — Progress Notes (Signed)
PCP - Dr. Renold Genta Cardiologist - denies  Chest x-ray - n/a EKG - 04/28/20 Stress Test - denies ECHO - denies Cardiac Cath - denies  Sleep Study - denies CPAP - denies  Blood Thinner Instructions: n/a Aspirin Instructions: n/a   COVID TEST- 04/28/20  Anesthesia review: No  Patient denies shortness of breath, fever, cough and chest pain at PAT appointment   All instructions explained to the patient, with a verbal understanding of the material. Patient agrees to go over the instructions while at home for a better understanding. Patient also instructed to self quarantine after being tested for COVID-19. The opportunity to ask questions was provided.

## 2020-04-29 DIAGNOSIS — F3342 Major depressive disorder, recurrent, in full remission: Secondary | ICD-10-CM | POA: Diagnosis not present

## 2020-04-29 DIAGNOSIS — F411 Generalized anxiety disorder: Secondary | ICD-10-CM | POA: Diagnosis not present

## 2020-04-29 DIAGNOSIS — G4700 Insomnia, unspecified: Secondary | ICD-10-CM | POA: Diagnosis not present

## 2020-04-29 LAB — SARS CORONAVIRUS 2 (TAT 6-24 HRS): SARS Coronavirus 2: NEGATIVE

## 2020-05-01 ENCOUNTER — Inpatient Hospital Stay (HOSPITAL_COMMUNITY): Payer: PPO

## 2020-05-01 ENCOUNTER — Inpatient Hospital Stay (HOSPITAL_COMMUNITY)
Admission: RE | Admit: 2020-05-01 | Discharge: 2020-05-02 | DRG: 455 | Disposition: A | Discharge status: Home / Self Care | Payer: PPO | Attending: Neurological Surgery | Admitting: Neurological Surgery

## 2020-05-01 ENCOUNTER — Inpatient Hospital Stay (HOSPITAL_COMMUNITY): Payer: PPO | Admitting: Certified Registered Nurse Anesthetist

## 2020-05-01 ENCOUNTER — Encounter (HOSPITAL_COMMUNITY): Payer: Self-pay | Admitting: Neurological Surgery

## 2020-05-01 ENCOUNTER — Encounter (HOSPITAL_COMMUNITY)
Admission: RE | Disposition: A | Discharge status: Home / Self Care | Payer: Self-pay | Source: Home / Self Care | Attending: Neurological Surgery

## 2020-05-01 ENCOUNTER — Other Ambulatory Visit: Payer: Self-pay

## 2020-05-01 DIAGNOSIS — I129 Hypertensive chronic kidney disease with stage 1 through stage 4 chronic kidney disease, or unspecified chronic kidney disease: Secondary | ICD-10-CM | POA: Diagnosis not present

## 2020-05-01 DIAGNOSIS — Z419 Encounter for procedure for purposes other than remedying health state, unspecified: Secondary | ICD-10-CM

## 2020-05-01 DIAGNOSIS — F419 Anxiety disorder, unspecified: Secondary | ICD-10-CM | POA: Diagnosis present

## 2020-05-01 DIAGNOSIS — E039 Hypothyroidism, unspecified: Secondary | ICD-10-CM | POA: Diagnosis present

## 2020-05-01 DIAGNOSIS — J439 Emphysema, unspecified: Secondary | ICD-10-CM | POA: Diagnosis present

## 2020-05-01 DIAGNOSIS — Z20822 Contact with and (suspected) exposure to covid-19: Secondary | ICD-10-CM | POA: Diagnosis not present

## 2020-05-01 DIAGNOSIS — Z981 Arthrodesis status: Secondary | ICD-10-CM | POA: Diagnosis not present

## 2020-05-01 DIAGNOSIS — Z90710 Acquired absence of both cervix and uterus: Secondary | ICD-10-CM | POA: Diagnosis not present

## 2020-05-01 DIAGNOSIS — M4316 Spondylolisthesis, lumbar region: Secondary | ICD-10-CM | POA: Diagnosis not present

## 2020-05-01 DIAGNOSIS — F32A Depression, unspecified: Secondary | ICD-10-CM | POA: Diagnosis present

## 2020-05-01 DIAGNOSIS — M8580 Other specified disorders of bone density and structure, unspecified site: Secondary | ICD-10-CM | POA: Diagnosis present

## 2020-05-01 DIAGNOSIS — Z79890 Hormone replacement therapy (postmenopausal): Secondary | ICD-10-CM

## 2020-05-01 DIAGNOSIS — M48062 Spinal stenosis, lumbar region with neurogenic claudication: Secondary | ICD-10-CM | POA: Diagnosis not present

## 2020-05-01 DIAGNOSIS — M5416 Radiculopathy, lumbar region: Secondary | ICD-10-CM | POA: Diagnosis present

## 2020-05-01 DIAGNOSIS — E559 Vitamin D deficiency, unspecified: Secondary | ICD-10-CM | POA: Diagnosis present

## 2020-05-01 DIAGNOSIS — Z79899 Other long term (current) drug therapy: Secondary | ICD-10-CM | POA: Diagnosis not present

## 2020-05-01 DIAGNOSIS — N189 Chronic kidney disease, unspecified: Secondary | ICD-10-CM | POA: Diagnosis present

## 2020-05-01 DIAGNOSIS — F17200 Nicotine dependence, unspecified, uncomplicated: Secondary | ICD-10-CM | POA: Diagnosis present

## 2020-05-01 DIAGNOSIS — E890 Postprocedural hypothyroidism: Secondary | ICD-10-CM | POA: Diagnosis present

## 2020-05-01 LAB — POCT I-STAT, CHEM 8
BUN: 19 mg/dL (ref 8–23)
Calcium, Ion: 1.31 mmol/L (ref 1.15–1.40)
Chloride: 104 mmol/L (ref 98–111)
Creatinine, Ser: 1.3 mg/dL — ABNORMAL HIGH (ref 0.44–1.00)
Glucose, Bld: 92 mg/dL (ref 70–99)
HCT: 38 % (ref 36.0–46.0)
Hemoglobin: 12.9 g/dL (ref 12.0–15.0)
Potassium: 3.8 mmol/L (ref 3.5–5.1)
Sodium: 141 mmol/L (ref 135–145)
TCO2: 25 mmol/L (ref 22–32)

## 2020-05-01 LAB — BASIC METABOLIC PANEL
Anion gap: 6 (ref 5–15)
BUN: 13 mg/dL (ref 8–23)
CO2: 26 mmol/L (ref 22–32)
Calcium: 8.8 mg/dL — ABNORMAL LOW (ref 8.9–10.3)
Chloride: 103 mmol/L (ref 98–111)
Creatinine, Ser: 1.09 mg/dL — ABNORMAL HIGH (ref 0.44–1.00)
GFR, Estimated: 54 mL/min — ABNORMAL LOW (ref >60)
Glucose, Bld: 142 mg/dL — ABNORMAL HIGH (ref 70–99)
Potassium: 4.1 mmol/L (ref 3.5–5.1)
Sodium: 135 mmol/L (ref 135–145)

## 2020-05-01 SURGERY — POSTERIOR LUMBAR FUSION 1 LEVEL
Anesthesia: General | Site: Back

## 2020-05-01 MED ORDER — DEXAMETHASONE SODIUM PHOSPHATE 10 MG/ML IJ SOLN
INTRAMUSCULAR | Status: DC | PRN
  Administered 2020-05-01: 10 mg via INTRAVENOUS

## 2020-05-01 MED ORDER — OXYBUTYNIN CHLORIDE 5 MG PO TABS
5.0000 mg | ORAL_TABLET | Freq: Every day | ORAL | Status: DC
  Administered 2020-05-02: 5 mg via ORAL
  Filled 2020-05-01: qty 1

## 2020-05-01 MED ORDER — PHENOL 1.4 % MT LIQD: 1.0000 | OROMUCOSAL | Status: DC | PRN

## 2020-05-01 MED ORDER — TRAZODONE HCL 100 MG PO TABS
200.0000 mg | ORAL_TABLET | Freq: Every day | ORAL | Status: DC
Start: 2020-05-01 — End: 2020-05-02
  Administered 2020-05-01: 200 mg via ORAL
  Filled 2020-05-01: qty 2

## 2020-05-01 MED ORDER — THROMBIN 5000 UNITS EX KIT
PACK | CUTANEOUS | Status: AC
  Filled 2020-05-01: qty 1

## 2020-05-01 MED ORDER — MAGNESIUM OXIDE 400 (241.3 MG) MG PO TABS
200.0000 mg | ORAL_TABLET | Freq: Every day | ORAL | Status: DC
  Administered 2020-05-02: 200 mg via ORAL
  Filled 2020-05-01: qty 1

## 2020-05-01 MED ORDER — THROMBIN 5000 UNITS EX SOLR
OROMUCOSAL | Status: DC | PRN
  Administered 2020-05-01: 5 mL via TOPICAL

## 2020-05-01 MED ORDER — PANTOPRAZOLE SODIUM 20 MG PO TBEC
20.0000 mg | DELAYED_RELEASE_TABLET | Freq: Every day | ORAL | Status: DC
  Administered 2020-05-02: 20 mg via ORAL
  Filled 2020-05-01: qty 1

## 2020-05-01 MED ORDER — MELATONIN 5 MG PO TABS
5.0000 mg | ORAL_TABLET | Freq: Every day | ORAL | Status: DC
  Administered 2020-05-01: 5 mg via ORAL
  Filled 2020-05-01: qty 1

## 2020-05-01 MED ORDER — THROMBIN 20000 UNITS EX SOLR
CUTANEOUS | Status: DC | PRN
  Administered 2020-05-01: 20 mL via TOPICAL

## 2020-05-01 MED ORDER — LIDOCAINE-EPINEPHRINE 1 %-1:100000 IJ SOLN
INTRAMUSCULAR | Status: DC | PRN
  Administered 2020-05-01: 5 mL

## 2020-05-01 MED ORDER — SODIUM CHLORIDE 0.9 % IV SOLN: 250.0000 mL | INTRAVENOUS | Status: DC

## 2020-05-01 MED ORDER — DOCUSATE SODIUM 100 MG PO CAPS
100.0000 mg | ORAL_CAPSULE | Freq: Two times a day (BID) | ORAL | Status: DC
  Administered 2020-05-01: 100 mg via ORAL
  Filled 2020-05-01 (×2): qty 1

## 2020-05-01 MED ORDER — MENTHOL 3 MG MT LOZG: 1.0000 | LOZENGE | OROMUCOSAL | Status: DC | PRN

## 2020-05-01 MED ORDER — ONDANSETRON HCL 4 MG/2ML IJ SOLN
INTRAMUSCULAR | Status: AC
  Filled 2020-05-01: qty 2

## 2020-05-01 MED ORDER — MEMANTINE HCL 5 MG PO TABS
10.0000 mg | ORAL_TABLET | Freq: Two times a day (BID) | ORAL | Status: DC
  Administered 2020-05-01 – 2020-05-02 (×2): 10 mg via ORAL
  Filled 2020-05-01 (×2): qty 2

## 2020-05-01 MED ORDER — ROCURONIUM BROMIDE 10 MG/ML (PF) SYRINGE
PREFILLED_SYRINGE | INTRAVENOUS | Status: AC
  Filled 2020-05-01: qty 10

## 2020-05-01 MED ORDER — LIDOCAINE 2% (20 MG/ML) 5 ML SYRINGE
INTRAMUSCULAR | Status: AC
  Filled 2020-05-01: qty 5

## 2020-05-01 MED ORDER — FLEET ENEMA 7-19 GM/118ML RE ENEM: 1.0000 | ENEMA | Freq: Once | RECTAL | Status: DC | PRN

## 2020-05-01 MED ORDER — EPHEDRINE SULFATE-NACL 50-0.9 MG/10ML-% IV SOSY
PREFILLED_SYRINGE | INTRAVENOUS | Status: DC | PRN
  Administered 2020-05-01: 10 mg via INTRAVENOUS
  Administered 2020-05-01 (×3): 5 mg via INTRAVENOUS
  Administered 2020-05-01: 10 mg via INTRAVENOUS

## 2020-05-01 MED ORDER — SODIUM CHLORIDE 0.9% FLUSH: 3.0000 mL | INTRAVENOUS | Status: DC | PRN

## 2020-05-01 MED ORDER — LORATADINE 10 MG PO TABS
10.0000 mg | ORAL_TABLET | Freq: Every day | ORAL | Status: DC
  Administered 2020-05-02: 10 mg via ORAL
  Filled 2020-05-01: qty 1

## 2020-05-01 MED ORDER — ESTRADIOL 1 MG PO TABS
1.0000 mg | ORAL_TABLET | Freq: Every day | ORAL | Status: DC
  Administered 2020-05-02: 1 mg via ORAL
  Filled 2020-05-01: qty 1

## 2020-05-01 MED ORDER — ACETAMINOPHEN 650 MG RE SUPP: 650.0000 mg | RECTAL | Status: DC | PRN

## 2020-05-01 MED ORDER — FLUTICASONE PROPIONATE 50 MCG/ACT NA SUSP
1.0000 | Freq: Every day | NASAL | Status: DC
  Administered 2020-05-02: 1 via NASAL
  Filled 2020-05-01: qty 16

## 2020-05-01 MED ORDER — CHLORHEXIDINE GLUCONATE CLOTH 2 % EX PADS: 6.0000 | MEDICATED_PAD | Freq: Once | CUTANEOUS | Status: DC

## 2020-05-01 MED ORDER — CEFAZOLIN SODIUM-DEXTROSE 2-4 GM/100ML-% IV SOLN: 2.0000 g | Freq: Three times a day (TID) | INTRAVENOUS | Status: DC

## 2020-05-01 MED ORDER — CHLORHEXIDINE GLUCONATE 0.12 % MT SOLN
15.0000 mL | Freq: Once | OROMUCOSAL | Status: AC
  Administered 2020-05-01: 15 mL via OROMUCOSAL

## 2020-05-01 MED ORDER — ALUM & MAG HYDROXIDE-SIMETH 200-200-20 MG/5ML PO SUSP: 30.0000 mL | Freq: Four times a day (QID) | ORAL | Status: DC | PRN

## 2020-05-01 MED ORDER — PROPOFOL 10 MG/ML IV BOLUS
INTRAVENOUS | Status: AC
  Filled 2020-05-01: qty 40

## 2020-05-01 MED ORDER — ORAL CARE MOUTH RINSE: 15.0000 mL | Freq: Once | OROMUCOSAL | Status: AC

## 2020-05-01 MED ORDER — ROCURONIUM BROMIDE 10 MG/ML (PF) SYRINGE
PREFILLED_SYRINGE | INTRAVENOUS | Status: DC | PRN
  Administered 2020-05-01: 50 mg via INTRAVENOUS
  Administered 2020-05-01 (×2): 10 mg via INTRAVENOUS

## 2020-05-01 MED ORDER — DIPHENHYDRAMINE HCL 25 MG PO TABS: 25.0000 mg | ORAL_TABLET | Freq: Every day | ORAL | Status: DC | PRN

## 2020-05-01 MED ORDER — PROPOFOL 10 MG/ML IV BOLUS
INTRAVENOUS | Status: DC | PRN
  Administered 2020-05-01: 50 mg via INTRAVENOUS
  Administered 2020-05-01: 30 mg via INTRAVENOUS
  Administered 2020-05-01: 40 mg via INTRAVENOUS
  Administered 2020-05-01: 60 mg via INTRAVENOUS

## 2020-05-01 MED ORDER — BUPIVACAINE HCL (PF) 0.5 % IJ SOLN
INTRAMUSCULAR | Status: AC
  Filled 2020-05-01: qty 30

## 2020-05-01 MED ORDER — PROPRANOLOL HCL 10 MG PO TABS
40.0000 mg | ORAL_TABLET | Freq: Two times a day (BID) | ORAL | Status: DC
  Administered 2020-05-01 – 2020-05-02 (×2): 40 mg via ORAL
  Filled 2020-05-01 (×2): qty 4

## 2020-05-01 MED ORDER — ONDANSETRON HCL 4 MG PO TABS: 4.0000 mg | ORAL_TABLET | Freq: Four times a day (QID) | ORAL | Status: DC | PRN

## 2020-05-01 MED ORDER — BISACODYL 10 MG RE SUPP: 10.0000 mg | Freq: Every day | RECTAL | Status: DC | PRN

## 2020-05-01 MED ORDER — SODIUM CHLORIDE 0.9% FLUSH
3.0000 mL | Freq: Two times a day (BID) | INTRAVENOUS | Status: DC
  Administered 2020-05-01 – 2020-05-02 (×2): 3 mL via INTRAVENOUS

## 2020-05-01 MED ORDER — VENLAFAXINE HCL ER 75 MG PO CP24
225.0000 mg | ORAL_CAPSULE | Freq: Every day | ORAL | Status: DC
Start: 2020-05-02 — End: 2020-05-02
  Administered 2020-05-02: 225 mg via ORAL
  Filled 2020-05-01: qty 3

## 2020-05-01 MED ORDER — ONDANSETRON HCL 4 MG/2ML IJ SOLN: 4.0000 mg | Freq: Four times a day (QID) | INTRAMUSCULAR | Status: DC | PRN

## 2020-05-01 MED ORDER — METHOCARBAMOL 500 MG PO TABS
500.0000 mg | ORAL_TABLET | Freq: Four times a day (QID) | ORAL | Status: DC | PRN
  Administered 2020-05-01 – 2020-05-02 (×3): 500 mg via ORAL
  Filled 2020-05-01 (×3): qty 1

## 2020-05-01 MED ORDER — HYDROMORPHONE HCL 1 MG/ML IJ SOLN
1.0000 mg | INTRAMUSCULAR | Status: DC | PRN
  Administered 2020-05-01: 1 mg via INTRAVENOUS
  Filled 2020-05-01: qty 1

## 2020-05-01 MED ORDER — THROMBIN 20000 UNITS EX SOLR
CUTANEOUS | Status: AC
  Filled 2020-05-01: qty 20000

## 2020-05-01 MED ORDER — ACETAMINOPHEN 325 MG PO TABS: 650.0000 mg | ORAL_TABLET | ORAL | Status: DC | PRN

## 2020-05-01 MED ORDER — LIDOCAINE-EPINEPHRINE 1 %-1:100000 IJ SOLN
INTRAMUSCULAR | Status: AC
  Filled 2020-05-01: qty 1

## 2020-05-01 MED ORDER — CYCLOBENZAPRINE HCL 10 MG PO TABS: 10.0000 mg | ORAL_TABLET | Freq: Three times a day (TID) | ORAL | Status: DC | PRN

## 2020-05-01 MED ORDER — LACTATED RINGERS IV SOLN: INTRAVENOUS | Status: DC

## 2020-05-01 MED ORDER — FENTANYL CITRATE (PF) 250 MCG/5ML IJ SOLN
INTRAMUSCULAR | Status: DC | PRN
  Administered 2020-05-01: 100 ug via INTRAVENOUS
  Administered 2020-05-01 (×3): 50 ug via INTRAVENOUS

## 2020-05-01 MED ORDER — BUPIVACAINE HCL (PF) 0.5 % IJ SOLN
INTRAMUSCULAR | Status: DC | PRN
  Administered 2020-05-01: 25 mL
  Administered 2020-05-01: 5 mL

## 2020-05-01 MED ORDER — HYDROCODONE-ACETAMINOPHEN 5-325 MG PO TABS
1.0000 | ORAL_TABLET | ORAL | Status: DC | PRN
  Administered 2020-05-01 – 2020-05-02 (×3): 2 via ORAL
  Filled 2020-05-01 (×3): qty 2

## 2020-05-01 MED ORDER — METHOCARBAMOL 1000 MG/10ML IJ SOLN
500.0000 mg | Freq: Four times a day (QID) | INTRAVENOUS | Status: DC | PRN
  Filled 2020-05-01: qty 5

## 2020-05-01 MED ORDER — BUPROPION HCL ER (XL) 150 MG PO TB24
300.0000 mg | ORAL_TABLET | Freq: Every day | ORAL | Status: DC
  Administered 2020-05-02: 300 mg via ORAL
  Filled 2020-05-01: qty 2

## 2020-05-01 MED ORDER — DIAZEPAM 5 MG PO TABS
5.0000 mg | ORAL_TABLET | Freq: Every day | ORAL | Status: DC | PRN
Start: 2020-05-01 — End: 2020-05-02
  Administered 2020-05-02: 5 mg via ORAL
  Filled 2020-05-01: qty 1

## 2020-05-01 MED ORDER — 0.9 % SODIUM CHLORIDE (POUR BTL) OPTIME
TOPICAL | Status: DC | PRN
  Administered 2020-05-01: 1000 mL

## 2020-05-01 MED ORDER — CHLORHEXIDINE GLUCONATE 0.12 % MT SOLN
OROMUCOSAL | Status: AC
  Filled 2020-05-01: qty 15

## 2020-05-01 MED ORDER — VANCOMYCIN HCL IN DEXTROSE 1-5 GM/200ML-% IV SOLN
1000.0000 mg | INTRAVENOUS | Status: AC
  Administered 2020-05-01: 1000 mg via INTRAVENOUS

## 2020-05-01 MED ORDER — POLYETHYLENE GLYCOL 3350 17 G PO PACK: 17.0000 g | PACK | Freq: Every day | ORAL | Status: DC | PRN

## 2020-05-01 MED ORDER — VANCOMYCIN HCL 1000 MG/200ML IV SOLN
1000.0000 mg | Freq: Once | INTRAVENOUS | Status: AC
  Administered 2020-05-02: 1000 mg via INTRAVENOUS
  Filled 2020-05-01: qty 200

## 2020-05-01 MED ORDER — FENOFIBRATE 160 MG PO TABS
160.0000 mg | ORAL_TABLET | Freq: Every day | ORAL | Status: DC
  Administered 2020-05-02: 160 mg via ORAL
  Filled 2020-05-01: qty 1

## 2020-05-01 MED ORDER — SUGAMMADEX SODIUM 200 MG/2ML IV SOLN
INTRAVENOUS | Status: DC | PRN
  Administered 2020-05-01: 200 mg via INTRAVENOUS

## 2020-05-01 MED ORDER — SENNA 8.6 MG PO TABS
1.0000 | ORAL_TABLET | Freq: Two times a day (BID) | ORAL | Status: DC
  Administered 2020-05-01: 8.6 mg via ORAL
  Filled 2020-05-01 (×2): qty 1

## 2020-05-01 MED ORDER — FENTANYL CITRATE (PF) 250 MCG/5ML IJ SOLN
INTRAMUSCULAR | Status: AC
  Filled 2020-05-01: qty 5

## 2020-05-01 MED ORDER — ONDANSETRON HCL 4 MG/2ML IJ SOLN
INTRAMUSCULAR | Status: DC | PRN
  Administered 2020-05-01: 4 mg via INTRAVENOUS

## 2020-05-01 MED ORDER — LIDOCAINE 2% (20 MG/ML) 5 ML SYRINGE
INTRAMUSCULAR | Status: DC | PRN
  Administered 2020-05-01: 40 mg via INTRAVENOUS

## 2020-05-01 MED ORDER — VANCOMYCIN HCL IN DEXTROSE 1-5 GM/200ML-% IV SOLN
INTRAVENOUS | Status: AC
  Filled 2020-05-01: qty 200

## 2020-05-01 MED ORDER — DEXAMETHASONE SODIUM PHOSPHATE 10 MG/ML IJ SOLN
INTRAMUSCULAR | Status: AC
  Filled 2020-05-01: qty 1

## 2020-05-01 MED ORDER — EPHEDRINE 5 MG/ML INJ
INTRAVENOUS | Status: AC
  Filled 2020-05-01: qty 10

## 2020-05-01 MED ORDER — CYCLOSPORINE 0.05 % OP EMUL
1.0000 [drp] | Freq: Two times a day (BID) | OPHTHALMIC | Status: DC
  Administered 2020-05-02 (×2): 1 [drp] via OPHTHALMIC
  Filled 2020-05-01 (×4): qty 1

## 2020-05-01 SURGICAL SUPPLY — 71 items
ADH SKN CLS APL DERMABOND .7 (GAUZE/BANDAGES/DRESSINGS) ×1
APL SRG 60D 8 XTD TIP BNDBL (TIP)
BASKET BONE COLLECTION (BASKET) ×2 IMPLANT
BLADE CLIPPER SURG (BLADE) IMPLANT
BONE CANC CHIPS 20CC PCAN1/4 (Bone Implant) ×2 IMPLANT
BUR MATCHSTICK NEURO 3.0 LAGG (BURR) ×2 IMPLANT
CAGE COROENT MP 8X9X23M-8 SPIN (Cage) ×2 IMPLANT
CANISTER SUCT 3000ML PPV (MISCELLANEOUS) ×2 IMPLANT
CHIPS CANC BONE 20CC PCAN1/4 (Bone Implant) ×1 IMPLANT
CNTNR URN SCR LID CUP LEK RST (MISCELLANEOUS) ×1 IMPLANT
CONT SPEC 4OZ STRL OR WHT (MISCELLANEOUS) ×2
COVER BACK TABLE 60X90IN (DRAPES) ×2 IMPLANT
COVER WAND RF STERILE (DRAPES) ×1 IMPLANT
DECANTER SPIKE VIAL GLASS SM (MISCELLANEOUS) ×2 IMPLANT
DERMABOND ADVANCED (GAUZE/BANDAGES/DRESSINGS) ×1
DERMABOND ADVANCED .7 DNX12 (GAUZE/BANDAGES/DRESSINGS) ×1 IMPLANT
DEVICE DISSECT PLASMABLAD 3.0S (MISCELLANEOUS) ×1 IMPLANT
DRAPE C-ARM 42X72 X-RAY (DRAPES) ×4 IMPLANT
DRAPE HALF SHEET 40X57 (DRAPES) ×1 IMPLANT
DRAPE LAPAROTOMY 100X72X124 (DRAPES) ×2 IMPLANT
DRSG OPSITE POSTOP 4X8 (GAUZE/BANDAGES/DRESSINGS) ×1 IMPLANT
DURAPREP 26ML APPLICATOR (WOUND CARE) ×2 IMPLANT
DURASEAL APPLICATOR TIP (TIP) IMPLANT
DURASEAL SPINE SEALANT 3ML (MISCELLANEOUS) IMPLANT
ELECT REM PT RETURN 9FT ADLT (ELECTROSURGICAL) ×2
ELECTRODE REM PT RTRN 9FT ADLT (ELECTROSURGICAL) ×1 IMPLANT
GAUZE 4X4 16PLY RFD (DISPOSABLE) IMPLANT
GAUZE SPONGE 4X4 12PLY STRL (GAUZE/BANDAGES/DRESSINGS) ×1 IMPLANT
GLOVE BIOGEL PI IND STRL 8.5 (GLOVE) ×2 IMPLANT
GLOVE BIOGEL PI INDICATOR 8.5 (GLOVE) ×2
GLOVE ECLIPSE 8.5 STRL (GLOVE) ×2 IMPLANT
GLOVE SKINSENSE NS SZ8.0 LF (GLOVE) ×3
GLOVE SKINSENSE STRL SZ8.0 LF (GLOVE) IMPLANT
GOWN STRL REUS W/ TWL LRG LVL3 (GOWN DISPOSABLE) IMPLANT
GOWN STRL REUS W/ TWL XL LVL3 (GOWN DISPOSABLE) IMPLANT
GOWN STRL REUS W/TWL 2XL LVL3 (GOWN DISPOSABLE) ×4 IMPLANT
GOWN STRL REUS W/TWL LRG LVL3 (GOWN DISPOSABLE)
GOWN STRL REUS W/TWL XL LVL3 (GOWN DISPOSABLE)
GRAFT BNE CANC CHIPS 1-8 20CC (Bone Implant) IMPLANT
HEMOSTAT POWDER KIT SURGIFOAM (HEMOSTASIS) ×1 IMPLANT
KIT BASIN OR (CUSTOM PROCEDURE TRAY) ×2 IMPLANT
KIT INFUSE SMALL (Orthopedic Implant) ×1 IMPLANT
KIT TURNOVER KIT B (KITS) ×2 IMPLANT
MILL MEDIUM DISP (BLADE) ×2 IMPLANT
NEEDLE HYPO 22GX1.5 SAFETY (NEEDLE) ×2 IMPLANT
NS IRRIG 1000ML POUR BTL (IV SOLUTION) ×2 IMPLANT
PACK LAMINECTOMY NEURO (CUSTOM PROCEDURE TRAY) ×2 IMPLANT
PAD ARMBOARD 7.5X6 YLW CONV (MISCELLANEOUS) ×6 IMPLANT
PATTIES SURGICAL .5 X.5 (GAUZE/BANDAGES/DRESSINGS) ×1 IMPLANT
PATTIES SURGICAL .5 X1 (DISPOSABLE) ×3 IMPLANT
PATTIES SURGICAL 1X1 (DISPOSABLE) ×1 IMPLANT
PLASMABLADE 3.0S (MISCELLANEOUS) ×2
ROD RELIN-O LORD 5.5X65MM (Rod) ×1 IMPLANT
ROD RELINE-O 5.5X75 LORD (Rod) ×1 IMPLANT
SCREW LOCK RELINE 5.5 TULIP (Screw) ×6 IMPLANT
SCREW RELINE-O POLY 6.5X45 (Screw) ×2 IMPLANT
SPONGE LAP 4X18 RFD (DISPOSABLE) ×1 IMPLANT
SPONGE SURGIFOAM ABS GEL 100 (HEMOSTASIS) ×2 IMPLANT
SUT PROLENE 6 0 BV (SUTURE) IMPLANT
SUT VIC AB 1 CT1 18XBRD ANBCTR (SUTURE) ×1 IMPLANT
SUT VIC AB 1 CT1 8-18 (SUTURE) ×4
SUT VIC AB 2-0 CP2 18 (SUTURE) ×2 IMPLANT
SUT VIC AB 3-0 SH 8-18 (SUTURE) ×3 IMPLANT
SUT VIC AB 4-0 RB1 18 (SUTURE) ×2 IMPLANT
SYR 3ML LL SCALE MARK (SYRINGE) ×8 IMPLANT
TOWEL GREEN STERILE (TOWEL DISPOSABLE) ×2 IMPLANT
TOWEL GREEN STERILE FF (TOWEL DISPOSABLE) ×2 IMPLANT
TRAY FOL W/BAG SLVR 16FR STRL (SET/KITS/TRAYS/PACK) IMPLANT
TRAY FOLEY MTR SLVR 16FR STAT (SET/KITS/TRAYS/PACK) ×1 IMPLANT
TRAY FOLEY W/BAG SLVR 16FR LF (SET/KITS/TRAYS/PACK) ×2
WATER STERILE IRR 1000ML POUR (IV SOLUTION) ×2 IMPLANT

## 2020-05-01 NOTE — Transfer of Care (Signed)
Immediate Anesthesia Transfer of Care Note  Patient: Megan Williams  Procedure(s) Performed: Lumbar Two-Three Posterior lumbar interbody fusion with revision of fixation from Lumbar Two to Lumbar Five (N/A Back)  Patient Location: PACU  Anesthesia Type:General  Level of Consciousness: awake and alert   Airway & Oxygen Therapy: Patient Spontanous Breathing and Patient connected to face mask oxygen  Post-op Assessment: Report given to RN, Post -op Vital signs reviewed and stable and Patient moving all extremities X 4  Post vital signs: Reviewed and stable  Last Vitals:  Vitals Value Taken Time  BP 131/70 05/01/20 1213  Temp    Pulse 71 05/01/20 1215  Resp 15 05/01/20 1215  SpO2 100 % 05/01/20 1215  Vitals shown include unvalidated device data.  Last Pain:  Vitals:   05/01/20 7998  TempSrc:   PainSc: 8       Patients Stated Pain Goal: 3 (72/15/87 2761)  Complications: No complications documented.

## 2020-05-01 NOTE — Progress Notes (Signed)
Pt arrived to the unit from PACU in wheelchair and walked to bed. Pt resting comfortably, bed in lowest position and call bell in reach. VS stable. Husband at bedside.   Belongings:  Biochemist, clinical case

## 2020-05-01 NOTE — Anesthesia Procedure Notes (Signed)
Procedure Name: Intubation Date/Time: 05/01/2020 8:47 AM Performed by: Reece Agar, CRNA Pre-anesthesia Checklist: Patient identified, Emergency Drugs available, Suction available and Patient being monitored Patient Re-evaluated:Patient Re-evaluated prior to induction Oxygen Delivery Method: Circle System Utilized Preoxygenation: Pre-oxygenation with 100% oxygen Induction Type: IV induction Ventilation: Mask ventilation without difficulty Laryngoscope Size: Mac and 3 Grade View: Grade I Tube type: Oral Tube size: 7.0 mm Number of attempts: 1 Airway Equipment and Method: Stylet Placement Confirmation: ETT inserted through vocal cords under direct vision,  positive ETCO2 and breath sounds checked- equal and bilateral Secured at: 22 cm Tube secured with: Tape Dental Injury: Teeth and Oropharynx as per pre-operative assessment

## 2020-05-01 NOTE — Progress Notes (Signed)
Per CRNA from Dr. Ellene Route-- keep foley in place tonight. No removal in PACU.

## 2020-05-01 NOTE — Op Note (Signed)
Date of surgery: 05/01/2020 Preoperative diagnosis: Spondylosis and stenosis L2-L3.  Status post decompression and fusion L3-L5.  Lumbar radiculopathy.  Neurogenic claudication. Postoperative diagnosis: Same Procedure: Laminectomy L2-3 decompression of the common dural tube and the L2 and L3 nerve root total discectomy L2-3 with more work than required for simple interbody technique.  Posterior lumbar interbody arthrodesis using peek spacers measuring 9 mm x 23 mm x 8 degrees lordosis.  Local autograft allograft and infuse.  Pedicle screw fixation L2 to previous fixation from L3-L4.  Also lateral arthrodesis with local autograft allograft and infuse.  Fluoroscopic imaging. Surgeon: Kristeen Miss First Assistant: Duffy Rhody, MD Anesthesia: General endotracheal Indications: Megan Williams is a 75 year old individual who has had significant problems with back pain and leg pain in the past she initially had a spondylolisthesis at L4-5 then developed adjacent level at L3-4 with significant stenosis and now has degenerated L2-3 with retrolisthesis and a large disc herniation eccentric to the left side.  She has had bilateral leg pain but left has been worse than right.  She been advised regarding the need for surgical decompression and stabilization of the L2-3 joint.  Procedure: The patient was brought to the operating room supine on the stretcher.  After the smooth induction of general tracheal anesthesia she was carefully turned prone.  Back was prepped with alcohol DuraPrep and draped in a sterile fashion.  Midline incision was created through the previous incision and dissection was carried down to the lumbodorsal fascia.  The spinous process of L2 was identified and then by dissecting along the laminar arches the L2-3 space was identified along with the old hardware and the pedicle screws ending at L3.  This was uncovered and dissected free.  Once the laminar arch of L2 was dissected free laminotomy was  created removing the inferior margin lamina of L2 out to and including the entirety of the facet at the L2-3 margin this was done bilaterally thickened redundant yellow ligament was taken up in this region the common dural tube was explored care was taken to decompress the L2-3 space and on the left side there was noted to be a substantial protrusion of the disc just below the disc space behind the body of L3.  This was incised and several fragments were removed but most of the fragments were very tenuous and attached to the bone itself we carefully dissected this free and mobilized the L3 nerve root inferiorly.  The disc space was isolated and then a complete discectomy was performed using combination of curettes rongeurs and disc shavers.  A 7 mm disc shaver was used to enlarge the interspace and shaved the endplates free of any cartilaginous attachments then a large toothed curette was used to free up any remnants along the borders of the disc and the entirety of the disc was removed at L2-L3.  This was first done on the left side and then done on the right side in a similar fashion.  Once this space was emptied the interspace was sized for an appropriate size spacer it was felt that a 9 mm x 23 mm x 8 degree lordosis spacer would fit best into this interval.  A total of 9 cc of autograft allograft and infuse was packed into the interspace along with the 2 cages.  Lateral gutters were then decorticated between L2 and L3 and 6 cc of bone graft was packed into each lateral gutter pedicle entry sites were chosen at L2 and 6.5 x 45 mm pedicle  screws were threaded through the pedicles at L2.  The previous hardware at L3-4 and on the right side down to L5 was removed the pedicle screw at L5 was removed.  Then a 65 mm rod was placed between the pedicle screws at L2-L4 on the left side and a 75 mm rod was used on the right side the system was connected in a neutral construct and final radiographs identified good  alignment of the vertebrae in good position of the hardware.  Hemostasis in the soft tissues was obtained meticulously care was taken to make sure that the L2 and L3 nerve roots were well decompressed and the common dural tube was amply patent no spinal fluid leaks occurred during this procedure.  Next 25 cc of half percent Marcaine was injected into the paraspinous fascia on either side.  The lumbodorsal fascia was then closed with #1 Vicryl in interrupted fashion 2-0 Vicryl was used in the subcutaneous tissues 3-0 Vicryl in the deep subcuticular layer and the final subcuticular layer was closed with 4-0 Vicryl suture.  Dermabond was placed on the skin.  Blood loss was estimated 200 cc.

## 2020-05-01 NOTE — Anesthesia Preprocedure Evaluation (Addendum)
Anesthesia Evaluation  Patient identified by MRN, date of birth, ID band Patient awake    Reviewed: Allergy & Precautions, NPO status , Patient's Chart, lab work & pertinent test results  History of Anesthesia Complications (+) PROLONGED EMERGENCE and history of anesthetic complications  Airway Mallampati: II  TM Distance: >3 FB Neck ROM: Full    Dental  (+) Dental Advisory Given   Pulmonary COPD, Current Smoker and Patient abstained from smoking.,    breath sounds clear to auscultation       Cardiovascular hypertension,  Rhythm:Regular     Neuro/Psych PSYCHIATRIC DISORDERS Anxiety Depression    GI/Hepatic negative GI ROS, Neg liver ROS,   Endo/Other  Hypothyroidism   Renal/GU Renal diseaseLab Results      Component                Value               Date                      CREATININE               1.32 (H)            04/28/2020           Lab Results      Component                Value               Date                      K                        3.9                 04/28/2020                Musculoskeletal  (+) Arthritis ,   Abdominal   Peds  Hematology   Anesthesia Other Findings   Reproductive/Obstetrics                            Anesthesia Physical Anesthesia Plan  ASA: III  Anesthesia Plan: General   Post-op Pain Management:    Induction: Intravenous  PONV Risk Score and Plan: 2 and Ondansetron and Dexamethasone  Airway Management Planned: Oral ETT  Additional Equipment: None  Intra-op Plan:   Post-operative Plan: Extubation in OR  Informed Consent: I have reviewed the patients History and Physical, chart, labs and discussed the procedure including the risks, benefits and alternatives for the proposed anesthesia with the patient or authorized representative who has indicated his/her understanding and acceptance.     Dental advisory given  Plan Discussed with:  CRNA and Surgeon  Anesthesia Plan Comments: (Patient with acute Cr increase to 1.3 with reduced GFR 30. Repeat Cr 1.3. Discussed concern of labs with patient and surgeon at length including risk of worsening renal function intra and post op due to blood pressue changes, fluid status, nephrotoxic drugs, abdominal pressure from prone position, etc. Patient and surgeon wish to proceed given significant pain and physical impairment due to back issue. Will proceed based on their wishes. )        Anesthesia Quick Evaluation

## 2020-05-01 NOTE — H&P (Signed)
Megan Williams is an 75 y.o. female.   Chief Complaint: Back pain bilateral lower extremity pain HPI: Patient is a 75 year old individuals had a previous decompression of fusion at L3-4 and L4-5.  More recently she developed recurrent back pain with bilateral leg pain primarily in the thighs and buttocks she has evidence of adjacent level disease with a junctional kyphosis and stenosis at L2-L3.  After careful discussion we advised surgical decompression stabilization at L2-L3 via posterior approach.  She is now admitted for that procedure  Past Medical History:  Diagnosis Date  . Allergy   . Anxiety   . Arthritis   . Complication of anesthesia    "difficulty waking up. I could hear them but I couldn't wake up"  . COPD (chronic obstructive pulmonary disease) ([place])   . Depression   . Emphysema   . Esophagitis   . Herpes simplex   . Hypertension   . Hypothyroidism   . Migraines   . Osteopenia   . Vitamin D deficiency     Past Surgical History:  Procedure Laterality Date  . BACK SURGERY  02/14/2018   lumbar 3-4  fusion  . BREAST EXCISIONAL BIOPSY Left 1979   Benign   . CATARACT EXTRACTION, BILATERAL    . CERVICAL FUSION    . CHOLECYSTECTOMY    . KYPHOPLASTY  09/19/2015   Dr. Ellene Route  T12  . LUMBAR DISC SURGERY  11/11/2017   L3-4  . Thumb surg    . THYROIDECTOMY    . TONSILLECTOMY    . VAGINAL HYSTERECTOMY      Family History  Adopted: Yes  Family history unknown: Yes   Social History:  reports that she has been smoking. She has never used smokeless tobacco. She reports current alcohol use of about 1.0 standard drink of alcohol per week. She reports that she does not use drugs.  Allergies:  Allergies  Allergen Reactions  . Doxycycline Other (See Comments)    Skin peeling   . Penicillins Hives    Has patient had a PCN reaction causing immediate rash, facial/tongue/throat swelling, SOB or lightheadedness with hypotension: No Has patient had a PCN reaction causing  severe rash involving mucus membranes or skin necrosis: No Has patient had a PCN reaction that required hospitalization: No Has patient had a PCN reaction occurring within the last 10 years: No If all of the above answers are "NO", then may proceed with Cephalosporin use.  . Celebrex [Celecoxib] Other (See Comments)    Blistering, pain, swelling  . Cephalosporins Nausea Only    Diarrhea, hives  . Morphine And Related Itching  . Latex Rash    Medications Prior to Admission  Medication Sig Dispense Refill  . buPROPion (WELLBUTRIN XL) 300 MG 24 hr tablet Take 300 mg by mouth daily.    . Calcium-Vitamin D-Vitamin K (VIACTIV CALCIUM PLUS D PO) Take 2 tablets by mouth daily. Chews    . clorazepate (TRANXENE) 3.75 MG tablet Take 3.75 mg by mouth at bedtime as needed for anxiety.    . Cyanocobalamin (B-12) 1000 MCG SUBL Place 2,000 mcg under the tongue daily.    . cyclobenzaprine (FLEXERIL) 10 MG tablet Take 10 mg by mouth 3 (three) times daily as needed for muscle spasms.    . diazepam (VALIUM) 5 MG tablet Take 5 mg by mouth daily as needed for anxiety (.5-1 tablet daily).    Marland Kitchen estradiol (ESTRACE) 1 MG tablet TAKE 1 TABLET BY MOUTH DAILY. (Patient taking differently: Take 1  mg by mouth daily.) 90 tablet 4  . fenofibrate 160 MG tablet TAKE 1 TABLET BY MOUTH DAILY. (Patient taking differently: Take 160 mg by mouth daily.) 90 tablet 1  . fexofenadine (ALLEGRA) 60 MG tablet Take 60 mg by mouth daily.    . fluticasone (FLONASE) 50 MCG/ACT nasal spray Place 1 spray into both nostrils daily.    Marland Kitchen HYDROcodone-acetaminophen (NORCO) 5-325 MG tablet One by mouth every 8 hours as needed for back pain (Patient taking differently: Take 1 tablet by mouth every 6 (six) hours as needed for moderate pain.) 15 tablet 0  . levothyroxine (SYNTHROID) 100 MCG tablet TAKE 1 TABLET BY MOUTH DAILY. (Patient taking differently: Take 100 mcg by mouth daily before breakfast.) 90 tablet 1  . Magnesium 250 MG TABS Take 250 mg  by mouth daily.    . melatonin 5 MG TABS Take 5 mg by mouth at bedtime.    . memantine (NAMENDA) 10 MG tablet Take 1 tablet (10 mg total) by mouth 2 (two) times daily. TAKE 10 mg TABLET BY MOUTH 2 TIMES DAILY. 180 tablet 4  . Multiple Vitamin (MULTIVITAMIN WITH MINERALS) TABS tablet Take 2 tablets by mouth daily. One a day women's    . naproxen sodium (ALEVE) 220 MG tablet Take 440 mg by mouth daily as needed (pain).    Marland Kitchen oxybutynin (DITROPAN) 5 MG tablet Take 5 mg by mouth daily.    . pantoprazole (PROTONIX) 40 MG tablet Take 1 tablet (40 mg total) by mouth daily. (Patient taking differently: Take 20 mg by mouth daily.) 90 tablet 1  . propranolol (INDERAL) 40 MG tablet Take 1 tablet (40 mg total) by mouth 2 (two) times daily. 60 tablet 11  . RESTASIS 0.05 % ophthalmic emulsion Place 1 drop into both eyes 2 (two) times daily.    . traZODone (DESYREL) 100 MG tablet Take 200 mg by mouth at bedtime.    Marland Kitchen venlafaxine XR (EFFEXOR-XR) 150 MG 24 hr capsule Take 225 mg by mouth daily with breakfast.    . cholestyramine (QUESTRAN) 4 GM/DOSE powder Take 4 g by mouth daily as needed for diarrhea or loose stools.    . clotrimazole-betamethasone (LOTRISONE) cream Apply 1 application topically 2 (two) times daily. (Patient not taking: Reported on 04/23/2020) 30 g 0  . denosumab (PROLIA) 60 MG/ML SOLN injection Inject 60 mg into the skin every 6 (six) months. Administer in upper arm, thigh, or abdomen    . diphenhydrAMINE (BENADRYL) 25 MG tablet Take 25-50 mg by mouth daily as needed (hives).     . valACYclovir (VALTREX) 500 MG tablet One po twice daily for 5 days (Patient taking differently: Take 500 mg by mouth 2 (two) times daily as needed (Fever blister/ Shingles). for 5 days) 10 tablet 5    No results found for this or any previous visit (from the past 48 hour(s)). No results found.  Review of Systems  Constitutional: Positive for activity change.  HENT: Negative.   Eyes: Negative.   Respiratory:  Negative.   Cardiovascular: Negative.   Gastrointestinal: Negative.   Endocrine: Negative.   Genitourinary: Negative.   Musculoskeletal: Positive for back pain, gait problem and myalgias.  Allergic/Immunologic: Negative.   Neurological: Positive for weakness and light-headedness.  Hematological: Negative.   Psychiatric/Behavioral: Negative.     Blood pressure 131/67, pulse 71, temperature 97.6 F (36.4 C), temperature source Oral, resp. rate 18, height 5\' 2"  (1.575 m), weight 57.2 kg, SpO2 100 %. Physical Exam Constitutional:  Appearance: Normal appearance. She is normal weight.  HENT:     Head: Normocephalic and atraumatic.     Left Ear: Tympanic membrane normal.     Nose: Nose normal.     Mouth/Throat:     Mouth: Mucous membranes are moist.  Eyes:     Extraocular Movements: Extraocular movements intact.     Pupils: Pupils are equal, round, and reactive to light.  Cardiovascular:     Rate and Rhythm: Normal rate and regular rhythm.     Pulses: Normal pulses.     Heart sounds: Normal heart sounds.  Pulmonary:     Effort: Pulmonary effort is normal.     Breath sounds: Normal breath sounds.  Abdominal:     General: Abdomen is flat.  Musculoskeletal:     Cervical back: Neck supple.     Comments: Positive straight leg raising at 30 degrees in either lower extremity Patrick's maneuver is negative bilaterally.  Skin:    General: Skin is warm and dry.     Capillary Refill: Capillary refill takes less than 2 seconds.  Neurological:     Mental Status: She is alert.     Comments: Cranial nerve examination is normal patient is alert oriented thought processes are normal.  Upper extremity strength is normal in the deltoids biceps triceps grips and intrinsics.  Lower extremity strength reveals mild weakness in the tibialis anterior and the iliopsoas bilaterally at 4 out of 5.  Absent reflexes in the patellae and the Achilles.  Station and gait is intact.  Patient's posture has her  with a 10 degree forward stooped.  Psychiatric:        Mood and Affect: Mood normal.        Behavior: Behavior normal.        Thought Content: Thought content normal.        Judgment: Judgment normal.      Assessment/Plan Spondylosis and stenosis L2-L3.  History of fusion L3-L5.  Posterior decompression and fusion L2-L3.  Earleen Newport, MD 05/01/2020, 7:36 AM

## 2020-05-02 LAB — BASIC METABOLIC PANEL
Anion gap: 6 (ref 5–15)
BUN: 12 mg/dL (ref 8–23)
CO2: 27 mmol/L (ref 22–32)
Calcium: 8.7 mg/dL — ABNORMAL LOW (ref 8.9–10.3)
Chloride: 103 mmol/L (ref 98–111)
Creatinine, Ser: 1.29 mg/dL — ABNORMAL HIGH (ref 0.44–1.00)
GFR, Estimated: 44 mL/min — ABNORMAL LOW (ref >60)
Glucose, Bld: 152 mg/dL — ABNORMAL HIGH (ref 70–99)
Potassium: 4 mmol/L (ref 3.5–5.1)
Sodium: 136 mmol/L (ref 135–145)

## 2020-05-02 LAB — CBC
HCT: 32.7 % — ABNORMAL LOW (ref 36.0–46.0)
Hemoglobin: 10.9 g/dL — ABNORMAL LOW (ref 12.0–15.0)
MCH: 33.3 pg (ref 26.0–34.0)
MCHC: 33.3 g/dL (ref 30.0–36.0)
MCV: 100 fL (ref 80.0–100.0)
Platelets: 212 10*3/uL (ref 150–400)
RBC: 3.27 MIL/uL — ABNORMAL LOW (ref 3.87–5.11)
RDW: 13.6 % (ref 11.5–15.5)
WBC: 10.5 10*3/uL (ref 4.0–10.5)
nRBC: 0 % (ref 0.0–0.2)

## 2020-05-02 MED ORDER — HYDROMORPHONE HCL 2 MG PO TABS: 2.0000 mg | ORAL_TABLET | ORAL | 0 refills | Status: DC | PRN

## 2020-05-02 MED ORDER — CHLORHEXIDINE GLUCONATE CLOTH 2 % EX PADS: 6.0000 | MEDICATED_PAD | Freq: Every day | CUTANEOUS | Status: DC

## 2020-05-02 MED ORDER — HYDROMORPHONE HCL 2 MG PO TABS: 2.0000 mg | ORAL_TABLET | ORAL | Status: DC | PRN

## 2020-05-02 NOTE — Discharge Summary (Signed)
Physician Discharge Summary  Patient ID: Megan Williams MRN: [id number] DOB/AGE: Jul 14, 1945 75 y.o.  Admit date: 05/01/2020 Discharge date: 05/02/2020  Admission Diagnoses: Lumbar stenosis L2-L3, lumbar radiculopathy, neurogenic claudication.  History of fusion L3-L5.  Chronic renal failure  Discharge Diagnoses: Lumbar stenosis L2-3, lumbar radiculopathy, neurogenic claudication.  History of fusion L3-L5.  Chronic renal failure Active Problems:   Lumbar stenosis with neurogenic claudication   Discharged Condition: good  Hospital Course: Patient was admitted to undergo surgical decompression and arthrodesis at the L2-3 level.  She tolerated surgery well.  It was noted during this admission that her initial creatinine was elevated at 1.3.  This appears to be a recent change.  She will have work-up medically as an outpatient.  Consults: None  Significant Diagnostic Studies: None  Treatments: surgery: See op note  Discharge Exam: Blood pressure (!) 103/45, pulse 70, temperature 97.7 F (36.5 C), temperature source Oral, resp. rate 13, height 5\' 2"  (1.575 m), weight 57.2 kg, SpO2 98 %. Incision is clean and dry motor function is intact  Disposition: Discharge disposition: 01-Home or Self Care       Discharge Instructions    Call MD for:  redness, tenderness, or signs of infection (pain, swelling, redness, odor or green/yellow discharge around incision site)   Complete by: As directed    Call MD for:  severe uncontrolled pain   Complete by: As directed    Call MD for:  temperature >100.4   Complete by: As directed    Diet - low sodium heart healthy   Complete by: As directed    Diet general   Complete by: As directed    Discharge wound care:   Complete by: As directed    Okay to shower. Do not apply salves or appointments to incision. No heavy lifting with the upper extremities greater than 15 pounds. May resume driving when not requiring pain medication and patient feels  comfortable with doing so.   Incentive spirometry RT   Complete by: As directed    Increase activity slowly   Complete by: As directed      Allergies as of 05/02/2020      Reactions   Doxycycline Other (See Comments)   Skin peeling   Penicillins Hives   Has patient had a PCN reaction causing immediate rash, facial/tongue/throat swelling, SOB or lightheadedness with hypotension: No Has patient had a PCN reaction causing severe rash involving mucus membranes or skin necrosis: No Has patient had a PCN reaction that required hospitalization: No Has patient had a PCN reaction occurring within the last 10 years: No If all of the above answers are "NO", then may proceed with Cephalosporin use.   Celebrex [celecoxib] Other (See Comments)   Blistering, pain, swelling   Cephalosporins Nausea Only   Diarrhea, hives   Morphine And Related Itching   Latex Rash      Medication List    STOP taking these medications   naproxen sodium 220 MG tablet Commonly known as: ALEVE     TAKE these medications   B-12 1000 MCG Subl Place 2,000 mcg under the tongue daily.   buPROPion 300 MG 24 hr tablet Commonly known as: WELLBUTRIN XL Take 300 mg by mouth daily.   cholestyramine 4 GM/DOSE powder Commonly known as: QUESTRAN Take 4 g by mouth daily as needed for diarrhea or loose stools.   clorazepate 3.75 MG tablet Commonly known as: TRANXENE Take 3.75 mg by mouth at bedtime as needed for anxiety.  clotrimazole-betamethasone cream Commonly known as: Lotrisone Apply 1 application topically 2 (two) times daily.   cyclobenzaprine 10 MG tablet Commonly known as: FLEXERIL Take 10 mg by mouth 3 (three) times daily as needed for muscle spasms.   denosumab 60 MG/ML Soln injection Commonly known as: PROLIA Inject 60 mg into the skin every 6 (six) months. Administer in upper arm, thigh, or abdomen   diazepam 5 MG tablet Commonly known as: VALIUM Take 5 mg by mouth daily as needed for anxiety  (.5-1 tablet daily).   diphenhydrAMINE 25 MG tablet Commonly known as: BENADRYL Take 25-50 mg by mouth daily as needed (hives).   estradiol 1 MG tablet Commonly known as: ESTRACE TAKE 1 TABLET BY MOUTH DAILY.   fenofibrate 160 MG tablet TAKE 1 TABLET BY MOUTH DAILY.   fexofenadine 60 MG tablet Commonly known as: ALLEGRA Take 60 mg by mouth daily.   fluticasone 50 MCG/ACT nasal spray Commonly known as: FLONASE Place 1 spray into both nostrils daily.   HYDROcodone-acetaminophen 5-325 MG tablet Commonly known as: Norco One by mouth every 8 hours as needed for back pain What changed:   how much to take  how to take this  when to take this  reasons to take this  additional instructions   HYDROmorphone 2 MG tablet Commonly known as: DILAUDID Take 1 tablet (2 mg total) by mouth every 3 (three) hours as needed for severe pain or moderate pain.   levothyroxine 100 MCG tablet Commonly known as: SYNTHROID TAKE 1 TABLET BY MOUTH DAILY. What changed: when to take this   Magnesium 250 MG Tabs Take 250 mg by mouth daily.   melatonin 5 MG Tabs Take 5 mg by mouth at bedtime.   memantine 10 MG tablet Commonly known as: NAMENDA Take 1 tablet (10 mg total) by mouth 2 (two) times daily. TAKE 10 mg TABLET BY MOUTH 2 TIMES DAILY.   multivitamin with minerals Tabs tablet Take 2 tablets by mouth daily. One a day women's   oxybutynin 5 MG tablet Commonly known as: DITROPAN Take 5 mg by mouth daily.   pantoprazole 40 MG tablet Commonly known as: Protonix Take 1 tablet (40 mg total) by mouth daily. What changed: how much to take   propranolol 40 MG tablet Commonly known as: INDERAL Take 1 tablet (40 mg total) by mouth 2 (two) times daily.   Restasis 0.05 % ophthalmic emulsion Generic drug: cycloSPORINE Place 1 drop into both eyes 2 (two) times daily.   traZODone 100 MG tablet Commonly known as: DESYREL Take 200 mg by mouth at bedtime.   valACYclovir 500 MG  tablet Commonly known as: VALTREX One po twice daily for 5 days What changed:   how much to take  how to take this  when to take this  reasons to take this  additional instructions   venlafaxine XR 150 MG 24 hr capsule Commonly known as: EFFEXOR-XR Take 225 mg by mouth daily with breakfast.   VIACTIV CALCIUM PLUS D PO Take 2 tablets by mouth daily. Chews            Discharge Care Instructions  (From admission, onward)         Start     Ordered   05/02/20 0000  Discharge wound care:       Comments: Okay to shower. Do not apply salves or appointments to incision. No heavy lifting with the upper extremities greater than 15 pounds. May resume driving when not requiring pain medication and  patient feels comfortable with doing so.   05/02/20 0841           Signed: Blanchie Dessert Beva Remund 05/02/2020, 8:41 AM

## 2020-05-02 NOTE — Progress Notes (Signed)
OT Cancellation Note  Patient Details Name: RASHAN PATIENT MRN: [id number] DOB: May 03, 1945   Cancelled Treatment:    Reason Eval/Treat Not Completed: Other (comment) (with PT.) Pt currently working with PT. Plan to reattempt in a bit.  Tyrone Schimke, OT Acute Rehabilitation Services Pager: [phone] Office: [phone]  05/02/2020, 9:06 AM

## 2020-05-02 NOTE — TOC Transition Note (Signed)
Transition of Care (TOC) - CM/SW Discharge Note Marvetta Gibbons RN,BSN Transitions of Care Unit 4NP (non trauma) - RN Case Manager See Treatment Team for direct Phone #   Patient Details  Name: Megan Williams MRN: [id number] Date of Birth: 1945/12/12  Transition of Care [facility]) CM/SW Contact:  Dawayne Patricia, RN Phone Number: 05/02/2020, 2:02 PM   Clinical Narrative:    Pt S/p PLIF L2-3 with revision of fixation L2-5- from home with spouse. Pt stable for transition home today- noted recs for outpt PT/OT. Referral sent to [facility] outpt rehab - [facility] for outpt PT/OT- pt has needed DME   Final next level of care: OP Rehab Barriers to Discharge: No Barriers Identified   Patient Goals and CMS Choice     Choice offered to / list presented to : NA  Discharge Placement               home        Discharge Plan and Services                     outpt PT/OT referral                Social Determinants of Health (SDOH) Interventions     Readmission Risk Interventions Readmission Risk Prevention Plan 05/02/2020  Post Dischage Appt Complete  Medication Screening Complete  Transportation Screening Complete  Some recent data might be hidden

## 2020-05-02 NOTE — Evaluation (Signed)
Occupational Therapy Evaluation Patient Details Name: Megan Williams MRN: [id number] DOB: February 16, 1946 Today's Date: 05/02/2020    History of Present Illness Pt is a 75 y.o. female who was admitted 05/01/20 with back and bil lower extremity pain with previous decompression of fusion at L3-4 and L4-5. Pt has evidence of adjacent level of disease with a junctional kyphosis and stenosis at L2-3. S/p PLIF L2-3 with revision of fixation L2-5. PMH: osteopenia, hypothyroisidm, HTN, herpes simplex, emphysema, depression, COPD, arthritis, and anxiety.   Clinical Impression   Pt admitted with the above diagnoses and presents with below problem list. Pt will benefit from continued acute OT to address the below listed deficits and maximize independence with basic ADLs prior to d/c home. At baseline, pt is independent with ADLs. Pt currently setup to min A with basic ADLs, min guard for functional transfers and mobility. Reviewed compensatory strategies and AE for maintaining back precautions during ADLs.      Follow Up Recommendations  Outpatient OT;Supervision - Intermittent    Equipment Recommendations  None recommended by OT    Recommendations for Other Services       Precautions / Restrictions Precautions Precautions: Fall;Back Precaution Booklet Issued: Yes (comment) Required Braces or Orthoses: Spinal Brace Spinal Brace: Applied in sitting position;Lumbar corset Restrictions Weight Bearing Restrictions: No      Mobility Bed Mobility Overal bed mobility: Needs Assistance Bed Mobility: Rolling;Sit to Sidelying Rolling: Supervision Sidelying to sit: Supervision       General bed mobility comments: extra time and effort.    Transfers Overall transfer level: Needs assistance Equipment used: Rolling walker (2 wheeled) Transfers: Sit to/from Stand Sit to Stand: Min guard         General transfer comment: to/from recliner and EOB. extra time and effort. Min guard for safety     Balance Overall balance assessment: Mild deficits observed, not formally tested                                         ADL either performed or assessed with clinical judgement   ADL Overall ADL's : Needs assistance/impaired Eating/Feeding: Set up;Sitting   Grooming: Set up;Sitting   Upper Body Bathing: Minimal assistance;Set up;Sitting   Lower Body Bathing: Minimal assistance;Sit to/from stand   Upper Body Dressing : Set up;Minimal assistance;Sitting   Lower Body Dressing: Minimal assistance;Sit to/from stand   Toilet Transfer: Min guard;Ambulation;Comfort height toilet;Grab bars   Toileting- Clothing Manipulation and Hygiene: Minimal assistance;Sit to/from stand       Functional mobility during ADLs: Min guard;Rolling walker General ADL Comments: Reviewed compensatory strategies with ADLs. Pt familiar with these from previous back surgery. Min A vs AE for LB ADLs     Vision         Perception     Praxis      Pertinent Vitals/Pain Pain Assessment: Faces Faces Pain Scale: Hurts whole lot Pain Location: back; surgical site Pain Descriptors / Indicators: Grimacing;Guarding;Operative site guarding Pain Intervention(s): Limited activity within patient's tolerance;Monitored during session;Premedicated before session;Repositioned     Hand Dominance Right   Extremity/Trunk Assessment Upper Extremity Assessment Upper Extremity Assessment: Generalized weakness           Communication Communication Communication: No difficulties   Cognition Arousal/Alertness: Awake/alert Behavior During Therapy: WFL for tasks assessed/performed Overall Cognitive Status: Within Functional Limits for tasks assessed  General Comments       Exercises     Shoulder Instructions      Home Living Family/patient expects to be discharged to:: Private residence Living Arrangements: Spouse/significant  other Available Help at Discharge: Family;Available 24 hours/day Type of Home: House Home Access: Stairs to enter [employer] of Steps: 4 Entrance Stairs-Rails: Right;Left Home Layout: Multi-level;Able to live on main level with bedroom/bathroom ("bonus room" upstairs where her computer is but the computer can be moved) Alternate Level Stairs-Number of Steps: 12 Alternate Level Stairs-Rails: Right (ascending, wall on L) Bathroom Shower/Tub: Occupational psychologist: Standard     Home Equipment: Grab bars - tub/shower;Shower seat - built in;Hand held shower head;Bedside commode;Walker - 2 wheels;Other (comment);Toilet riser          Prior Functioning/Environment Level of Independence: Independent        Comments: Hx of falls which she reports is due to her back        OT Problem List: Impaired balance (sitting and/or standing);Decreased knowledge of use of DME or AE;Decreased knowledge of precautions;Pain      OT Treatment/Interventions: Self-care/ADL training;DME and/or AE instruction;Therapeutic activities;Patient/family education;Balance training    OT Goals(Current goals can be found in the care plan section) Acute Rehab OT Goals Patient Stated Goal: to go home and decrease risk for falls OT Goal Formulation: With patient Time For Goal Achievement: 05/16/20 Potential to Achieve Goals: Good ADL Goals Pt Will Perform Lower Body Bathing: with modified independence;sit to/from stand Pt Will Perform Lower Body Dressing: with modified independence;sit to/from stand Pt Will Transfer to Toilet: with modified independence;ambulating Pt Will Perform Toileting - Clothing Manipulation and hygiene: with modified independence;sitting/lateral leans;sit to/from stand Pt Will Perform Tub/Shower Transfer: with modified independence;ambulating;shower seat;rolling walker  OT Frequency: Min 2X/week   Barriers to D/C:            Co-evaluation               AM-PAC OT "6 Clicks" Daily Activity     Outcome Measure Help from another person eating meals?: None Help from another person taking care of personal grooming?: None Help from another person toileting, which includes using toliet, bedpan, or urinal?: A Little Help from another person bathing (including washing, rinsing, drying)?: A Little Help from another person to put on and taking off regular upper body clothing?: None Help from another person to put on and taking off regular lower body clothing?: A Little 6 Click Score: 21   End of Session Equipment Utilized During Treatment: Back brace;Rolling walker  Activity Tolerance: Patient tolerated treatment well Patient left: in bed;with call bell/phone within reach  OT Visit Diagnosis: Unsteadiness on feet (R26.81);Pain                Time: 4496-7591 OT Time Calculation (min): 22 min Charges:  OT General Charges $OT Visit: 1 Visit OT Evaluation $OT Eval Low Complexity: [place], OT Acute Rehabilitation Services Pager: [phone] Office: [phone]   Hortencia Pilar 05/02/2020, 1:25 PM

## 2020-05-02 NOTE — Discharge Instructions (Signed)
Discharge wound care:       Comments: Okay to shower. Do not apply salves or appointments to incision. No heavy lifting with the upper extremities greater than 15 pounds. May resume driving when not requiring pain medication and patient feels comfortable with doing so.

## 2020-05-02 NOTE — Plan of Care (Signed)

## 2020-05-02 NOTE — Evaluation (Addendum)
Physical Therapy Evaluation Patient Details Name: Megan Williams MRN: [id number] DOB: 05/03/1945 Today's Date: 05/02/2020   History of Present Illness  Pt is a 75 y.o. female who was admitted 05/01/20 with back and bil lower extremity pain with previous decompression of fusion at L3-4 and L4-5. Pt has evidence of adjacent level of disease with a junctional kyphosis and stenosis at L2-3. S/p PLIF L2-3 with revision of fixation L2-5. PMH: osteopenia, hypothyroisidm, HTN, herpes simplex, emphysema, depression, COPD, arthritis, and anxiety.  Clinical Impression  Pt presents with condition above and deficits mentioned below, see PT Problem List. PTA, she was performing all functional mobility independently without AD/AE, but did report hx of falls (stating they were due to her back and especially when stooping low to reach objects in grocery stores). 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 Pt only required min guard-supervision for all bed mobility, transfers, gait with RW, and stairs with 1 hand rail today, but needed extra time and effort to complete all due to pain. Educated pt on spinal precautions, safe stair negotiation and guarding, and car transfers and provided pt with back handout. Pt instructed to utilize her RW initially upon d/c. Pt would benefit from Outpatient PT at d/c to maximize her safety and independence with all functional mobility and decrease her risk for falls. Will continue to follow acutely.    Follow Up Recommendations Outpatient PT;Supervision for mobility/OOB    Equipment Recommendations  None recommended by PT    Recommendations for Other Services       Precautions / Restrictions Precautions Precautions: Fall;Back Precaution Booklet Issued: Yes (comment) (handout provided and  reviewed) Precaution Comments: Reviewed spinal precautions and donning of brace Required Braces or Orthoses: Spinal Brace ([place] lumbar) Spinal Brace: Applied in sitting position;Lumbar corset Restrictions Weight Bearing Restrictions: No      Mobility  Bed Mobility Overal bed mobility: Needs Assistance Bed Mobility: Rolling;Sidelying to Sit Rolling: Supervision Sidelying to sit: Supervision       General bed mobility comments: Extra time and effort but pt able to perform log roll and transition sidelying > sit L EOB with bed flat and no use of rails with only supervision for safety.    Transfers Overall transfer level: Needs assistance Equipment used: Rolling walker (2 wheeled) Transfers: Sit to/from Stand Sit to Stand: Min guard         General transfer comment: Extra time and cues for hand placement to power up to stand from EOB with RW, no LOB. Min guard for safety.  Ambulation/Gait Ambulation/Gait assistance: Min guard Gait Distance (Feet): 200 Feet Assistive device: Rolling walker (2 wheeled) Gait Pattern/deviations: Step-through pattern;Decreased stride length;Narrow base of support Gait velocity: reduced Gait velocity interpretation: <1.8 ft/sec, indicate of risk for recurrent falls General Gait Details: Ambulates at slow pace without LOB, cuing for improved bil foot clearance. Occasional poor management of RW in tight spaces. Min guard for safety.  Stairs Stairs: Yes Stairs assistance: Min guard Stair Management: One rail Left;One rail Right;Step to pattern Number of Stairs: 2 General stair comments: Ascending with L rail and descending with R, cuing pt to lead up with good leg and down with bad with success and extra time and effort. No overt LOB, min guard for safety. Pt educated on safe assistance from husband at d/c via carrying RW up/down stairs and guarding  pt properly.  Wheelchair Mobility    Modified Rankin (Stroke Patients Only)       Balance  Overall balance assessment: Mild deficits observed, not formally tested                                           Pertinent Vitals/Pain Pain Assessment: 0-10 Pain Score: 8  Pain Location: back; surgical site Pain Descriptors / Indicators: Discomfort;Grimacing;Constant;Crushing;Burning;Operative site guarding;Guarding Pain Intervention(s): Limited activity within patient's tolerance;Monitored during session;Premedicated before session;Repositioned    Home Living Family/patient expects to be discharged to:: Private residence Living Arrangements: Spouse/significant other Available Help at Discharge: Family;Available 24 hours/day Type of Home: House Home Access: Stairs to enter Entrance Stairs-Rails: Right;Left (cannot reach both at same time) Entrance Stairs-Number of Steps: 4 Home Layout: Multi-level;Able to live on main level with bedroom/bathroom ("bonus room" upstairs where her computer is but the computer can be moved) Home Equipment: Grab bars - tub/shower;Shower seat - built in;Hand held shower head;Bedside commode;Walker - 2 wheels;Other (comment);Toilet riser ("walking staff") Additional Comments: Pt loves boston terriers and fosters them.    Prior Function Level of Independence: Independent         Comments: Hx of falls which she reports is due to her back     Hand Dominance   Dominant Hand: Right    Extremity/Trunk Assessment   Upper Extremity Assessment Upper Extremity Assessment: Generalized weakness (denies numbness/tingling)    Lower Extremity Assessment Lower Extremity Assessment: RLE deficits/detail;LLE deficits/detail RLE Deficits / Details: MMT scores of the following: knee extension 4+, knee flexion 4-, ankle dorsiflexion 5 RLE Sensation: decreased light touch;decreased proprioception (dorsal foot; delayed dynamic proprioception in ankle) RLE Coordination: decreased gross motor LLE Deficits / Details: MMT scores of the following: knee  extension 4+, knee flexion 4-, ankle dorsiflexion 5 LLE Sensation: decreased light touch;decreased proprioception (dorsal foot; delayed dynamic proprioception in ankle) LLE Coordination: decreased fine motor;decreased gross motor (dysdiadochokinesia noted)    Cervical / Trunk Assessment Cervical / Trunk Assessment: Kyphotic  Communication   Communication: No difficulties  Cognition Arousal/Alertness: Awake/alert Behavior During Therapy: WFL for tasks assessed/performed Overall Cognitive Status: Within Functional Limits for tasks assessed                                        General Comments General comments (skin integrity, edema, etc.): Educated pt to avoid stooping down for low objects as she reports this typically causes her to fall; Educated pt on safe car transfers    Exercises     Assessment/Plan    PT Assessment Patient needs continued PT services  PT Problem List Decreased strength;Decreased range of motion;Decreased activity tolerance;Decreased balance;Decreased mobility;Decreased coordination;Decreased knowledge of use of DME;Decreased knowledge of precautions;Impaired sensation;Pain       PT Treatment Interventions DME instruction;Gait training;Stair training;Functional mobility training;Therapeutic activities;Therapeutic exercise;Balance training;Neuromuscular re-education;Patient/family education    PT Goals (Current goals can be found in the Care Plan section)  Acute Rehab PT Goals Patient Stated Goal: to go home and decrease risk for falls PT Goal Formulation: With patient Time For Goal Achievement: 05/16/20 Potential to Achieve Goals: Good    Frequency Min 5X/week   Barriers to discharge        Co-evaluation  AM-PAC PT "6 Clicks" Mobility  Outcome Measure Help needed turning from your back to your side while in a flat bed without using bedrails?: A Little Help needed moving from lying on your back to sitting on the  side of a flat bed without using bedrails?: A Little Help needed moving to and from a bed to a chair (including a wheelchair)?: A Little Help needed standing up from a chair using your arms (e.g., wheelchair or bedside chair)?: A Little Help needed to walk in hospital room?: A Little Help needed climbing 3-5 steps with a railing? : A Little 6 Click Score: 18    End of Session Equipment Utilized During Treatment: Gait belt Activity Tolerance: Patient tolerated treatment well Patient left: in chair;with call bell/phone within reach;with chair alarm set;with nursing/sitter in room Nurse Communication: Mobility status PT Visit Diagnosis: Unsteadiness on feet (R26.81);Other abnormalities of gait and mobility (R26.89);History of falling (Z91.81);Muscle weakness (generalized) (M62.81);Difficulty in walking, not elsewhere classified (R26.2);Pain Pain - Right/Left:  (back) Pain - part of body:  (back)    Time: 6962-9528 PT Time Calculation (min) (ACUTE ONLY): 47 min   Charges:   PT Evaluation $PT Eval Moderate Complexity: 1 Mod PT Treatments $Gait Training: 8-22 mins $Therapeutic Activity: 8-22 mins        Moishe Spice, PT, DPT Acute Rehabilitation Services  Pager: [phone] Office: [place] 05/02/2020, 9:55 AM

## 2020-05-05 MED FILL — Heparin Sodium (Porcine) Inj 1000 Unit/ML: INTRAMUSCULAR | Qty: 30 | Status: AC

## 2020-05-05 MED FILL — Sodium Chloride IV Soln 0.9%: INTRAVENOUS | Qty: 1000 | Status: AC

## 2020-05-05 NOTE — Anesthesia Postprocedure Evaluation (Signed)
Anesthesia Post Note  Patient: Megan Williams  Procedure(s) Performed: Lumbar Two-Three Posterior lumbar interbody fusion with revision of fixation from Lumbar Two to Lumbar Five (N/A Back)     Patient location during evaluation: PACU Anesthesia Type: General Level of consciousness: awake and alert Pain management: pain level controlled Vital Signs Assessment: post-procedure vital signs reviewed and stable Respiratory status: spontaneous breathing, nonlabored ventilation, respiratory function stable and patient connected to nasal cannula oxygen Cardiovascular status: blood pressure returned to baseline and stable Postop Assessment: no apparent nausea or vomiting Anesthetic complications: no   No complications documented.  Last Vitals:  Vitals:   05/02/20 0737 05/02/20 1113  BP: (!) 103/45 (!) 91/44  Pulse: 70 72  Resp: 13 13  Temp: 36.5 C 36.9 C  SpO2: 98% 98%    Last Pain:  Vitals:   05/02/20 1113  TempSrc: Oral  PainSc:                  Margarine Grosshans

## 2020-05-15 ENCOUNTER — Telehealth: Payer: Self-pay | Admitting: Internal Medicine

## 2020-05-15 NOTE — Telephone Encounter (Signed)
Scheduled 6 month

## 2020-05-16 ENCOUNTER — Other Ambulatory Visit: Payer: PPO | Admitting: Internal Medicine

## 2020-05-16 ENCOUNTER — Other Ambulatory Visit: Payer: Self-pay

## 2020-05-16 DIAGNOSIS — M48062 Spinal stenosis, lumbar region with neurogenic claudication: Secondary | ICD-10-CM | POA: Diagnosis not present

## 2020-05-16 DIAGNOSIS — E782 Mixed hyperlipidemia: Secondary | ICD-10-CM | POA: Diagnosis not present

## 2020-05-16 DIAGNOSIS — E039 Hypothyroidism, unspecified: Secondary | ICD-10-CM

## 2020-05-17 LAB — LIPID PANEL
Cholesterol: 183 mg/dL (ref <200)
HDL: 42 mg/dL — ABNORMAL LOW (ref >=50)
LDL Cholesterol (Calc): 116 mg/dL (calc) — ABNORMAL HIGH
Non-HDL Cholesterol (Calc): 141 mg/dL (calc) — ABNORMAL HIGH (ref <130)
Total CHOL/HDL Ratio: 4.4 (calc) (ref <5.0)
Triglycerides: 134 mg/dL (ref <150)

## 2020-05-17 LAB — TSH: TSH: 0.83 mIU/L (ref 0.40–4.50)

## 2020-05-19 ENCOUNTER — Encounter: Payer: Self-pay | Admitting: Physical Therapy

## 2020-05-19 ENCOUNTER — Ambulatory Visit: Payer: PPO | Attending: Neurological Surgery | Admitting: Occupational Therapy

## 2020-05-19 ENCOUNTER — Other Ambulatory Visit: Payer: Self-pay

## 2020-05-19 ENCOUNTER — Ambulatory Visit: Payer: PPO | Admitting: Physical Therapy

## 2020-05-19 DIAGNOSIS — R262 Difficulty in walking, not elsewhere classified: Secondary | ICD-10-CM | POA: Diagnosis not present

## 2020-05-19 DIAGNOSIS — R278 Other lack of coordination: Secondary | ICD-10-CM | POA: Diagnosis not present

## 2020-05-19 DIAGNOSIS — M5442 Lumbago with sciatica, left side: Secondary | ICD-10-CM | POA: Insufficient documentation

## 2020-05-19 DIAGNOSIS — M5441 Lumbago with sciatica, right side: Secondary | ICD-10-CM | POA: Diagnosis not present

## 2020-05-19 DIAGNOSIS — M6281 Muscle weakness (generalized): Secondary | ICD-10-CM | POA: Insufficient documentation

## 2020-05-19 NOTE — Therapy (Signed)
[facility] [place], [place], [address] Phone: [phone]   Fax:  [phone]  Physical Therapy Evaluation  Patient Details  Name: Megan Williams MRN: [id number] Date of Birth: 1945/10/25 Referring Provider (PT): Elsner   Encounter Date: 05/19/2020   PT End of Session - 05/19/20 1531    Visit Number 1    Date for PT Re-Evaluation 07/19/20    Authorization Type [employer]    PT Start Time 1455    PT Stop Time 1533    PT Time Calculation (min) 38 min    Equipment Utilized During Treatment Gait belt    Activity Tolerance Patient tolerated treatment well           Past Medical History:  Diagnosis Date  . Allergy   . Anxiety   . Arthritis   . Complication of anesthesia    "difficulty waking up. I could hear them but I couldn't wake up"  . COPD (chronic obstructive pulmonary disease) ([place])   . Depression   . Emphysema   . Esophagitis   . Herpes simplex   . Hypertension   . Hypothyroidism   . Migraines   . Osteopenia   . Vitamin D deficiency     Past Surgical History:  Procedure Laterality Date  . BACK SURGERY  02/14/2018   lumbar 3-4  fusion  . BREAST EXCISIONAL BIOPSY Left 1979   Benign   . CATARACT EXTRACTION, BILATERAL    . CERVICAL FUSION    . CHOLECYSTECTOMY    . KYPHOPLASTY  09/19/2015   Dr. Ellene Route  T12  . LUMBAR DISC SURGERY  11/11/2017   L3-4  . Thumb surg    . THYROIDECTOMY    . TONSILLECTOMY    . VAGINAL HYSTERECTOMY      There were no vitals filed for this visit.    Subjective Assessment - 05/19/20 1500    Subjective Patient is here after a lumbar fusion and revision on May 01, 2020.  She reports issues with balance and weakness since the second surgery in December 2019.  She reports feeling weak, and difficulty with balance today.  She also c/o LBP.  She is in a back brace.    Pertinent History COPD, Depression, multiple spinal surgeries    Limitations  Sitting;Lifting;Standing;Walking;House hold activities    How long can you sit comfortably? 15 minutes    How long can you stand comfortably? 2 minutes    How long can you walk comfortably? 5 minutes    Patient Stated Goals be stronger, have better balance, less pain, better activity tolerance    Currently in Pain? Yes    Pain Score 6     Pain Location Back   reports that prior to surgery she had left LBP   Pain Orientation Right;Lower    Pain Descriptors / Indicators Tingling;Aching    Pain Type Acute pain;Surgical pain    Pain Radiating Towards tingling left buttock and left thigh, occasionally in the right LE as well    Pain Onset More than a month ago    Pain Frequency Constant    Aggravating Factors  sitting, standing, walking, any motions pain up to 9/10    Pain Relieving Factors pain meds, gabapentin, wearing brace, rest at best pain a 6/10    Effect of Pain on Daily Activities reports limits all ADL's and all mobility              [facility]  PT Assessment - 05/19/20 0001      Assessment   Medical Diagnosis S/P lumbar fusion and revision    Referring Provider (PT) Elsner    Onset Date/Surgical Date 05/01/20    Prior Therapy no      Precautions   Precautions Back    Precaution Comments no bending, lifting twisting      Balance Screen   Has the patient fallen in the past 6 months Yes    How many times? 2    Has the patient had a decrease in activity level because of a fear of falling?  Yes    Is the patient reluctant to leave their home because of a fear of falling?  Yes      Home Environment   Additional Comments has steps into the home, was able to do housework, cooking and cleaning      Prior Function   Level of Independence Independent with household mobility with device;Needs assistance with ADLs;Needs assistance with homemaking    Vocation Retired    Leisure has two Counsellor Comments in back brace      ROM / Strength    AROM / PROM / Strength Strength;AROM      AROM   Overall AROM Comments lumbar ROM was not tested due to surgical protocol and she is in a brace      Strength   Overall Strength Comments Hips 4-/5, knees 4-/5, ankles 4-/5      Palpation   Palpation comment tight and tender in the buttocks, lumbar area and into the thighs      Ambulation/Gait   Gait Comments reports at home she uses a FWW, today she reports that she walkied in with her husband, she reports that at home she also uses furniture and walls to balance self at home      Standardized Balance Assessment   Standardized Balance Assessment Timed Up and Go Test;Five Times Sit to Stand    Five times sit to stand comments  55 seconds, uses hands to push up, really knees collapse into valgus      Timed Up and Go Test   Normal TUG (seconds) 59    TUG Comments no device, very slow motions, ataxic type motions of the LE's, loss of balance x 2 using wall to catch self                      Objective measurements completed on examination: See above findings.                 PT Short Term Goals - 05/19/20 1542      PT SHORT TERM GOAL #1   Title independent with initial HEP    Time 3    Period Weeks    Status New             PT Long Term Goals - 05/19/20 1542      PT LONG TERM GOAL #1   Title understand posture, body mechanics and restrictions with her surgery    Time 12    Period Weeks    Status New      PT LONG TERM GOAL #2   Title decresae pain 25%    Time 12    Period Weeks    Status New      PT LONG TERM GOAL #3   Title increase lumbar ROM 25%  Time 12    Period Weeks    Status New      PT LONG TERM GOAL #4   Title decrease TUG to 25 seconds    Time 12    Period Weeks    Status New      PT LONG TERM GOAL #5   Title decrese 5x STS to 25 seconds    Time 12    Period Weeks    Status New                  Plan - 05/19/20 1533    Clinical Impression Statement Patient  has had her 3rd surgery in the lumbar area, she is now fused L2-5.  The most recent surgery was 05/01/20.  She reports very limited tolerance to activity, including sitting, standing and walking, difficulty with all ADL's.  She reports that she has increased pain in the right low back, rated a 5-9/10.  She is very unsteady with walking, seems to have shaky legs with some ataxic motions, she reports that at home she needs the walls and furniture for balance.  With TUG it took her 59 seconds without device, but had to use the wall and had one instance of a LOB where she almost fell, 5x STS took 55 seconds, knees into valgus and very shaky in the LE's.    Personal Factors and Comorbidities Comorbidity 3+    Comorbidities depression, COPD, multiple spinal surgeries    Stability/Clinical Decision Making Evolving/Moderate complexity    Clinical Decision Making Moderate    Rehab Potential Good    PT Frequency 2x / week    PT Duration 12 weeks    PT Treatment/Interventions ADLs/Self Care Home Management;Electrical Stimulation;Moist Heat;Cryotherapy;Gait training;Stair training;Functional mobility training;Therapeutic activities;Therapeutic exercise;Balance training;Neuromuscular re-education;Patient/family education;Manual techniques    PT Next Visit Plan slowly progress strength, function and balance    Consulted and Agree with Plan of Care Patient           Patient will benefit from skilled therapeutic intervention in order to improve the following deficits and impairments:  Abnormal gait,Decreased range of motion,Difficulty walking,Cardiopulmonary status limiting activity,Increased muscle spasms,Pain,Decreased activity tolerance,Impaired flexibility,Decreased balance,Improper body mechanics,Postural dysfunction,Decreased strength,Decreased mobility  Visit Diagnosis: Acute bilateral low back pain with bilateral sciatica - Plan: PT plan of care cert/re-cert  Muscle weakness (generalized) - Plan: PT plan  of care cert/re-cert  Difficulty in walking, not elsewhere classified - Plan: PT plan of care cert/re-cert     Problem List Patient Active Problem List   Diagnosis Date Noted  . Lumbar stenosis with neurogenic claudication 02/14/2018  . Mild cognitive disorder 05/23/2017  . Mild cognitive impairment 01/29/2015  . Degenerative joint disease of cervical spine 01/29/2015  . Gait difficulty 01/06/2015  . Urinary urgency 01/06/2015  . Tremor 01/06/2015  . Esophagitis   . Emphysema   . COPD (chronic obstructive pulmonary disease) ([place]) 05/03/2011  . Osteopenia   . Migraines   . Hypertension 01/15/2011  . Insomnia 01/15/2011  . Hypothyroidism 09/17/2010  . Anxiety 09/17/2010  . Hyperlipidemia 09/17/2010  . History of smoking 09/17/2010    Sumner Boast., PT 05/19/2020, 3:45 PM  [facility]. [place], [place], [address] Phone: [phone]   Fax:  [phone]  Name: TULLY BURGO MRN: [id number] Date of Birth: [birth year]

## 2020-05-19 NOTE — Patient Instructions (Signed)
Access Code: [email or web address] URL: [email or web address] Date: 05/19/2020 Prepared by: Lum Babe  Exercises Seated March - 3 x daily - 7 x weekly - 1 sets - 10 reps - 3 hold Seated Toe Taps - 3 x daily - 7 x weekly - 1 sets - 10 reps - 3 hold Seated Long Arc Quad - 3 x daily - 7 x weekly - 1 sets - 10 reps - 3 hold

## 2020-05-20 ENCOUNTER — Encounter: Payer: Self-pay | Admitting: Internal Medicine

## 2020-05-20 ENCOUNTER — Ambulatory Visit (INDEPENDENT_AMBULATORY_CARE_PROVIDER_SITE_OTHER): Payer: PPO | Admitting: Internal Medicine

## 2020-05-20 VITALS — BP 100/70 | HR 70 | Ht 62.0 in | Wt 123.0 lb

## 2020-05-20 DIAGNOSIS — E559 Vitamin D deficiency, unspecified: Secondary | ICD-10-CM

## 2020-05-20 DIAGNOSIS — M8580 Other specified disorders of bone density and structure, unspecified site: Secondary | ICD-10-CM

## 2020-05-20 DIAGNOSIS — K219 Gastro-esophageal reflux disease without esophagitis: Secondary | ICD-10-CM | POA: Diagnosis not present

## 2020-05-20 DIAGNOSIS — Z780 Asymptomatic menopausal state: Secondary | ICD-10-CM | POA: Diagnosis not present

## 2020-05-20 DIAGNOSIS — G250 Essential tremor: Secondary | ICD-10-CM

## 2020-05-20 DIAGNOSIS — R7989 Other specified abnormal findings of blood chemistry: Secondary | ICD-10-CM | POA: Diagnosis not present

## 2020-05-20 DIAGNOSIS — M810 Age-related osteoporosis without current pathological fracture: Secondary | ICD-10-CM

## 2020-05-20 DIAGNOSIS — M48062 Spinal stenosis, lumbar region with neurogenic claudication: Secondary | ICD-10-CM | POA: Diagnosis not present

## 2020-05-20 DIAGNOSIS — R413 Other amnesia: Secondary | ICD-10-CM

## 2020-05-20 NOTE — Progress Notes (Signed)
   Subjective:    Patient ID: Megan Williams, female    DOB: 07/22/1945, 75 y.o.   MRN: [id number]  HPI 75 year old Female for 6 month follow up.Just had L2-L3  Decompression and Arthrodesis per Dr. Ellene Route. She currently is in OT and PT. Having some back pain.Wearing a brace for her back. Tried Gabapentin for pain but it caused tics.  Hx of mild memory loss treated with Namenda. Has Neurology follow up soon.  History of osteopenia. I have reviewed her bone density studies since 2013 and she has had only osteopenia  with lowest T score -2.0. Last bone density was in August 2020. An order has been placed for one in August this year by me today as it is due then.   History of mild hyperlipidemia. LDL is 116 HDL low at 42 and total cholesterol is 183. Hx hypertriglyceridemia and is on fenofibrate. Does not want statin med.  Had Covid-19 Sept 2021 but recovered at home and subsequebtly got vaccinated.  Some concern about her creatinine and may have mild CKD. Not on NSAIDS currently for back pain. Creatinine seems to fluctuate with level of hydration. She says Dr. Ellene Route brought this up to her and I will refer to Nephrology to be assessed.It is 1.07 today and was 1.29 on March 4 and 1.09 March 3 and 1.30 March 3 as well. Was 1.32 Feb 28 and 0.99 Feb 2021 and 0.91 in August 2021.  Review of Systems   Dr. Layne Benton needs labs for Prolia therapy. We will need to fax labs drawn today including Vitamin D level as requested.  Vitamin D was done in 2021 and was not repeated recently due to expense. It costs out of pocket $100 for Medicare patients. Level was good last year at 51.  She knows it may not be covered.Level today is 83 and was reviewed.     Objective:   Physical Exam  BP 100/70 pulse 70 pulse ox 96% weight 123 pounds. BMI 22.50 She is petite and looks a bit frail today. Is wearing back brace.  Skin warm and dry.  Does not seem to be in a lot of pain.  Chest is clear to auscultation.  Cardiac exam  regular rate and rhythm.  Extremities without edema.  She is alert and oriented x3 today.  Labs drawn including vitamin D level for Dr. Layne Benton.      Assessment & Plan:  History of fluctuating levels of creatinine may have element of chronic kidney disease stage IIIa.  Referred to nephrology to sort this out as she seems concerned and says Dr. Ellene Route is concerned.  Avoid NSAIDs which she has known.  History of lumbar stenosis and is status post recent lumbar surgery.  Recovering well.  Is in therapy.  Wearing back brace.  History of osteopenia-I do not see where she has been diagnosed with osteoporosis on bone density study.  She gets Prolia every 6 months per Dr. Layne Benton and we will fax labs requested per Dr. Layne Benton.  Hypothyroidism treated with levothyroxine  Memory loss-she is on Namenda and has upcoming appointment with Neurology  GE reflux treated with PPI  Essential tremor treated with propranolol  History of anxiety and depression and is on SSRI medication as well as Desiree L and transiting  Hypertriglyceridemia treated with fenofibrate  Plan: Her Medicare wellness visit and health maintenance exam is due August 2022.

## 2020-05-20 NOTE — Therapy (Signed)
[place] [place], [place], [address] Phone: [phone]   Fax:  [phone]  Occupational Therapy Evaluation  Patient Details  Name: Megan Williams MRN: [id number] Date of Birth: 12-06-1945 Referring Provider (OT): Kristeen Miss, MD   Encounter Date: 05/19/2020   OT End of Session - 05/19/20 1544    Visit Number 1    Number of Visits 21    Date for OT Re-Evaluation 07/18/20    Authorization Type [employer]    Progress Note Due on Visit 10    OT Start Time 1530    OT Stop Time 1615    OT Time Calculation (min) 45 min    Activity Tolerance Patient tolerated treatment well;Patient limited by pain    Behavior During Therapy [facility] for tasks assessed/performed;Anxious           Past Medical History:  Diagnosis Date  . Allergy   . Anxiety   . Arthritis   . Complication of anesthesia    "difficulty waking up. I could hear them but I couldn't wake up"  . COPD (chronic obstructive pulmonary disease) ([place])   . Depression   . Emphysema   . Esophagitis   . Herpes simplex   . Hypertension   . Hypothyroidism   . Migraines   . Osteopenia   . Vitamin D deficiency     Past Surgical History:  Procedure Laterality Date  . BACK SURGERY  02/14/2018   lumbar 3-4  fusion  . BREAST EXCISIONAL BIOPSY Left 1979   Benign   . CATARACT EXTRACTION, BILATERAL    . CERVICAL FUSION    . CHOLECYSTECTOMY    . KYPHOPLASTY  09/19/2015   Dr. Ellene Route  T12  . LUMBAR DISC SURGERY  11/11/2017   L3-4  . Thumb surg    . THYROIDECTOMY    . TONSILLECTOMY    . VAGINAL HYSTERECTOMY      There were no vitals filed for this visit.   Subjective Assessment - 05/19/20 1532    Subjective  Patient reports she has had 3 prior back surgeries and that she experienced difficulty w/ weakness, balance, and complaints of recurrent back pain with bilateral leg pain after back surgery in December of 2019 (per chart review, decompression  of fusion at L3-4 and L4-5 on 02/14/18). Most recent surgery was at L2-L3 (PLIF - posterior lumbar interbody fusion) on 05/01/20. Pt also reports that she has been diagnosed with early-onset Alzheimer's, frequently feels very anxious, and that she is on medication which commonly causes her to feel "loopy."    Pertinent History Mild neurocognitive disorder, essential tremor, osteopenia, arthritis, emphysema, COPD, depression, anxiety    Patient Stated Goals "I want to get back to being me"    Currently in Pain? Yes    Pain Score 8     Pain Location Back    Pain Orientation Right;Lower    Pain Descriptors / Indicators Tingling;Aching    Pain Type Acute pain;Surgical pain    Pain Radiating Towards R lower back radiating down to R buttock and R upper leg    Pain Onset More than a month ago    Pain Frequency Constant    Aggravating Factors  sitting, standing, walking, occasionally sleeping    Pain Relieving Factors pain medication, gabapentin, wearing the brace    Effect of Pain on Daily Activities All ADLs and functional mobility             [facility]  OT Assessment - 05/19/20 1546      Assessment   Medical Diagnosis S/P PLIF L2-3    Referring Provider (OT) Kristeen Miss, MD    Onset Date/Surgical Date 05/01/20    Hand Dominance Right    Next MD Visit 05/20/20    Prior Therapy PT      Precautions   Precautions Back    Precaution Comments "Okay to shower. Do not apply salves or appointments to incision. No heavy lifting with the upper extremities greater than 15 pounds. May resume driving when not requiring pain medication and patient feels comfortable with doing so."    Required Braces or Orthoses Other Brace/Splint      Balance Screen   Has the patient fallen in the past 6 months Yes    Has the patient had a decrease in activity level because of a fear of falling?  Yes   Pt reports feeling very anxious about falling     Home  Environment   Family/patient expects to be discharged to:  Private residence    Type of [place] One level    Bathroom Shower/Tub Walk-in Shower    Bathroom Toilet Handicapped height    [place] sponge    [place] - 2 wheels;Bedside commode;Grab bars - tub/shower;Hand held shower head    Lives With Spouse   2 dogs     Prior Function   Level of Independence Independent    Vocation Retired    Leisure reading on Kindle; dance   shag and lindy hop     ADL   Eating/Feeding Modified independent    Grooming Modified independent    Upper Body Bathing Supervision/safety    Lower Body Bathing Supervision/safety    Upper Body Dressing Supervision/safety    Lower Body Dressing Maximal assistance    Toileting - Clothing Manipulation Maximal assistance    Toileting -  Hygiene Increase time    Tub/Shower Transfer Minimal assistance      Mobility   Mobility Status Needs assist;History of falls      Vision - History   Baseline Vision Wears glasses for distance only    Visual History Cataracts   Corrective sx years ago   Patient Visual Report --   Pt reports she is having difficulty reading and believes it may be due to medication     Vision Assessment   Ocular Range of Motion Restricted on looking up    Tracking/Visual Pursuits Impaired - to be further tested in functional context    Saccades Decreased speed of saccadic movement    Visual Fields No apparent deficits      Activity Tolerance   Activity Tolerance Comments Decreased activity tolerance due to pain/discomfort      Cognition   Overall Cognitive Status History of cognitive impairments - at baseline    Cognition Comments Medication also impacts memory and attention      Coordination   Gross Motor Movements are Fluid and Coordinated No    Fine Motor Movements are Fluid and Coordinated No    Coordination and Movement Description Mild ataxia, essential tremor      ROM / Strength   AROM / PROM / Strength  AROM;Strength      AROM   Overall AROM  Within functional limits for tasks performed   BUE [facility]     Strength   Overall Strength Unable to assess;Due to precautions  OT Education - 05/19/20 1542    Education Details Education provided on role and purpose of OT, as well as potential interventions and goals for therapy.    Person(s) Educated Patient    Methods Explanation    Comprehension Verbalized understanding            OT Short Term Goals - 05/20/20 2055      OT SHORT TERM GOAL #1   Title Pt will independently identify at least 2 compensatory or energy conservation strategies, including use of AE, to implement during BADLs    Baseline No compensatory strategies at this time    Time 5    Period Weeks    Status New    Target Date 06/24/20      OT SHORT TERM GOAL #2   Title Pt will thread BLEs into pants/bottoms w/ Min A, using AE prn, at least 75% of the time    Baseline Unable to thread BLEs    Time 5    Period Weeks    Status New      OT SHORT TERM GOAL #3   Title Pt will pull bottoms up once legs are threaded w/ Mod I    Baseline Min A to pull pants up once threaded    Time 5    Period Weeks    Status New             OT Long Term Goals - 05/19/20 1615      OT LONG TERM GOAL #1   Title Pt will demonstrate understanding of appropriate body mechanics and adaptive strategies to assist w/ pain management and safety during BADLs    Baseline Decreased knowledge of adaptive strategies and body mechanics    Time 10    Period Weeks    Status New    Target Date 07/28/20      OT LONG TERM GOAL #2   Title Pt will safely participate in complete LB dressing w/ SPV    Baseline Max A w/ LB dressing    Time 10    Period Weeks    Status New      OT LONG TERM GOAL #3   Title Pt will safely transfer in <> out of shower w/ CGA    Baseline Min A w/ tub/shower transfer    Time 10    Period Weeks    Status New            Plan - 05/19/20 1544     Clinical Impression Statement Pt is a 75 y.o. female who presents to OP OT s/p posterior lumbar interbody fusion (PLIF) at L2-3 level with revision of prior L2-L5 fixation that occurred 05/01/20. Pt currently lives with her husband in a single-level home and is retired. PMHx includes hypothyroidism, anxiety, HLP, HTN, osteopenia, COPD, emphysema, spinal stenosis, essential tremor, and mild cognitive disorder. Pt will benefit from skilled occupational therapy services to address strength and coordination, [place] and [place], cognition, safety awareness, compensatory strategies, implementation of AE/assistive devices, and development of an HEP program to improve participation and safety during ADLs and IADLs.    OT Occupational Profile and History Detailed Assessment- Review of Records and additional review of physical, cognitive, psychosocial history related to current functional performance    Occupational performance deficits (Please refer to evaluation for details): ADL's;IADL's;Leisure;Social Participation    Body Structure / Function / Physical Skills ADL;Decreased knowledge of precautions;Balance;UE functional use;Decreased knowledge of use of DME;FMC;Dexterity;Body mechanics;GMC;Strength;Pain;Endurance;Coordination;IADL    Cognitive Skills  Attention;Memory;Problem Solve;Safety Awareness    Rehab Potential Good    Clinical Decision Making Multiple treatment options, significant modification of task necessary    Comorbidities Affecting Occupational Performance: Presence of comorbidities impacting occupational performance    Modification or Assistance to Complete Evaluation  Min-Moderate modification of tasks or assist with assess necessary to complete eval    OT Frequency 2x / week    OT Duration Other (comment)   10 weeks   OT Treatment/Interventions Self-care/ADL training;Therapeutic exercise;Coping strategies training;Aquatic Therapy;Moist Heat;Neuromuscular education;Patient/family education;Energy  conservation;Therapeutic activities;Cryotherapy;DME and/or AE instruction;Manual Therapy;Passive range of motion;Cognitive remediation/compensation    Plan Review goals w/ pt; Assess UB/LB dressing    Consulted and Agree with Plan of Care Patient           Patient will benefit from skilled therapeutic intervention in order to improve the following deficits and impairments:   Body Structure / Function / Physical Skills: ADL,Decreased knowledge of precautions,Balance,UE functional use,Decreased knowledge of use of DME,FMC,Dexterity,Body mechanics,GMC,Strength,Pain,Endurance,Coordination,IADL Cognitive Skills: Attention,Memory,Problem Solve,Safety Awareness     Visit Diagnosis: Acute bilateral low back pain with bilateral sciatica  Muscle weakness (generalized)  Other lack of coordination    Problem List Patient Active Problem List   Diagnosis Date Noted  . Lumbar stenosis with neurogenic claudication 02/14/2018  . Mild cognitive disorder 05/23/2017  . Mild cognitive impairment 01/29/2015  . Degenerative joint disease of cervical spine 01/29/2015  . Gait difficulty 01/06/2015  . Urinary urgency 01/06/2015  . Tremor 01/06/2015  . Esophagitis   . Emphysema   . COPD (chronic obstructive pulmonary disease) ([place]) 05/03/2011  . Osteopenia   . Migraines   . Hypertension 01/15/2011  . Insomnia 01/15/2011  . Hypothyroidism 09/17/2010  . Anxiety 09/17/2010  . Hyperlipidemia 09/17/2010  . History of smoking 09/17/2010     Kathrine Cords, OTR/L, MSOT 05/20/2020, 9:23 PM  [place] [place], [place], [address] Phone: [phone]   Fax:  [phone]  Name: MIRYAH RALLS MRN: [id number] Date of Birth: 1945-07-19

## 2020-05-20 NOTE — Progress Notes (Signed)
PATIENT: Megan Williams DOB: 1945/07/21  REASON FOR VISIT: follow up HISTORY FROM: patient HISTORY Megan Williams): Megan Williams 75 years old right-handed female, seen in refer by her primary care physician Dr. Tedra Williams for evaluation of tremor, memory loss, and gait problems   She had a history of hypertension, depression, anxiety, used to work as a Radio broadcast assistant, and church Glass blower/designer  Around 2015, she began to notice memory trouble, she needs family to remind her multiple times, tends to forget people's name, phone number, she used to be able to remember all the congregation's name in the past, her memory trouble since 4 gradually getting worse, she still driving without getting lost  She reported history of migraine since young, for a while in September to October 2015, she has migraines almost on a daily basis, which has improved after stopped taking Trileptal  She also reported mild bilateral hands tremor since 2014, most noticeable when she holding a utensil, or write with a pencil, she also noticed mild bilateral hands weakness, in Thanksgiving 2015, she has dropped her dishes to the floor, because of bilateral hands weakness,  Around 2015, she also noticed mild stiff unsteady gait, worsening urinary urgency, she denies significant neck pain, complains of moderate low back pain, she denies bilateral upper or lower extremity paresthesia  She is adopted, does not know family history, none of her children has tremor  MRI brain film in November 2016, mild generalized atrophy, mild supratentorium small vessel disease,   MRI of the cervical spine showed evidence of previous fusion from C4-7, 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  UPDATE October 27 2016: We reviewed the laboratory evaluation in August 2018, A1c was 4.9, normal folic acid, vitamin D40, TSH, fasting lipid profile, triglycerides 198, LDL 85, CMP showed creatinine of 1.0, CBC showed MCV  of 105,  She has retired, continue combating depression, not memory loss, especially when she is stressed, she is on polypharmacy treatment, Effexor, Wellbutrin, Xanax as needed trazodone as needed for sleep  She also has bilateral hands tremor, mild gait abnormality due to low back pain  Update May 23, 2017: She still noticed mild memory loss, but overall doing well. She is now retired, Financial controller Williams,Mini-Mental Status Examination was 30 out of 30 today, still on polypharmacy treatment, trazodone, Effexor, Xanax, Wellbutrin  UPDATE Sept 14 2020: She continues to complain of worsening bilateral hands tremor, which started since 2001, she chronic insomnia, chronic, half tablet of Xanax for treatment, Her memory loss has been stable, continue to be active, taking care of  Megan Williams   Update May 22, 2019 SS: When last seen by Dr. [name], propanolol 40 mg twice a day was added for essential tremor.  Tremor is much improved, much easier when typing, has really seen a big difference overall.  Her memory is stable, her husband says she has repetitive questioning, but reports a hearing issue.  Her mother had dementia.  She remains active, looks forward to less pandemic restriction, has 3 Megan Williams. MMSE 30/30.  Update May 21, 2020 SS: Tremor is better when taking propranolol, didn't take this morning, 75% helpful. Good and bad days with memory, MMSE 30/30. On a bad day, a bad day for memory is triggered by back pain. Isn't driving right now due to pain. Underwent, lumbar decompression surgery at L2-3 05/01/20 with Megan Williams earlier this month. Going to PT/OT. So far surgery, has helped the left sided  back pain, nothing on the right. Gabapentin didn't help, taking Dilaudid for pain.   REVIEW OF SYSTEMS: Out of a complete 14 system review of symptoms, the patient complains only of the following symptoms, and all other reviewed systems are negative.  Tremor, memory  loss  ALLERGIES: Allergies  Allergen Reactions  . Doxycycline Other (See Comments)    Skin peeling   . Penicillins Hives    Has patient had a PCN reaction causing immediate rash, facial/tongue/throat swelling, SOB or lightheadedness with hypotension: No Has patient had a PCN reaction causing severe rash involving mucus membranes or skin necrosis: No Has patient had a PCN reaction that required hospitalization: No Has patient had a PCN reaction occurring within the last 10 years: No If all of the above answers are "NO", then may proceed with Cephalosporin use.  . Celebrex [Celecoxib] Other (See Comments)    Blistering, pain, swelling  . Cephalosporins Nausea Only    Diarrhea, hives  . Gabapentin     Causes TICS   . Morphine And Related Itching  . Latex Rash    HOME MEDICATIONS: Outpatient Medications Prior to Visit  Medication Sig Dispense Refill  . buPROPion (WELLBUTRIN XL) 300 MG 24 hr tablet Take 300 mg by mouth daily.    . Calcium-Vitamin D-Vitamin K (VIACTIV CALCIUM PLUS D PO) Take 2 tablets by mouth daily. Chews    . cholestyramine (QUESTRAN) 4 GM/DOSE powder Take 4 g by mouth daily as needed for diarrhea or loose stools.    . clorazepate (TRANXENE) 3.75 MG tablet Take 3.75 mg by mouth at bedtime as needed for anxiety.    . Cyanocobalamin (B-12) 1000 MCG SUBL Place 2,000 mcg under the tongue daily.    . cyclobenzaprine (FLEXERIL) 10 MG tablet Take 10 mg by mouth 3 (three) times daily as needed for muscle spasms.    Marland Kitchen denosumab (PROLIA) 60 MG/ML SOLN injection Inject 60 mg into the skin every 6 (six) months. Administer in upper arm, thigh, or abdomen    . diphenhydrAMINE (BENADRYL) 25 MG tablet Take 25-50 mg by mouth daily as needed (hives).     Marland Kitchen estradiol (ESTRACE) 1 MG tablet TAKE 1 TABLET BY MOUTH DAILY. (Patient taking differently: Take 1 mg by mouth daily.) 90 tablet 4  . fenofibrate 160 MG tablet TAKE 1 TABLET BY MOUTH DAILY. (Patient taking differently: Take 160 mg by  mouth daily.) 90 tablet 1  . fexofenadine (ALLEGRA) 60 MG tablet Take 60 mg by mouth daily.    . fluticasone (FLONASE) 50 MCG/ACT nasal spray Place 1 spray into both nostrils daily.    Marland Kitchen HYDROmorphone (DILAUDID) 2 MG tablet Take 1 tablet (2 mg total) by mouth every 3 (three) hours as needed for severe pain or moderate pain. 30 tablet 0  . levothyroxine (SYNTHROID) 100 MCG tablet TAKE 1 TABLET BY MOUTH DAILY. 90 tablet 1  . Magnesium 250 MG TABS Take 250 mg by mouth daily.    . melatonin 5 MG TABS Take 5 mg by mouth at bedtime.    . memantine (NAMENDA) 10 MG tablet Take 1 tablet (10 mg total) by mouth 2 (two) times daily. TAKE 10 mg TABLET BY MOUTH 2 TIMES DAILY. 180 tablet 4  . Multiple Vitamin (MULTIVITAMIN WITH MINERALS) TABS tablet Take 2 tablets by mouth daily. One a day women's    . oxybutynin (DITROPAN) 5 MG tablet Take 5 mg by mouth daily.    . pantoprazole (PROTONIX) 40 MG tablet Take 1 tablet (40 mg  total) by mouth daily. (Patient taking differently: Take 20 mg by mouth daily.) 90 tablet 1  . propranolol (INDERAL) 40 MG tablet Take 1 tablet (40 mg total) by mouth 2 (two) times daily. 60 tablet 11  . RESTASIS 0.05 % ophthalmic emulsion Place 1 drop into both eyes 2 (two) times daily.    . traZODone (DESYREL) 100 MG tablet Take 200 mg by mouth at bedtime.    . valACYclovir (VALTREX) 500 MG tablet One po twice daily for 5 days (Patient taking differently: Take 500 mg by mouth 2 (two) times daily as needed (Fever blister/ Shingles). for 5 days) 10 tablet 5  . venlafaxine XR (EFFEXOR-XR) 150 MG 24 hr capsule Take 225 mg by mouth daily with breakfast.     No facility-administered medications prior to visit.    PAST MEDICAL HISTORY: Past Medical History:  Diagnosis Date  . Allergy   . Anxiety   . Arthritis   . Complication of anesthesia    "difficulty waking up. I could hear them but I couldn't wake up"  . COPD (chronic obstructive pulmonary disease) ([place])   . Depression   . Emphysema    . Esophagitis   . Herpes simplex   . Hypertension   . Hypothyroidism   . Migraines   . Osteopenia   . Vitamin D deficiency     PAST SURGICAL HISTORY: Past Surgical History:  Procedure Laterality Date  . BACK SURGERY  02/14/2018   lumbar 3-4  fusion  . BREAST EXCISIONAL BIOPSY Left 1979   Benign   . CATARACT EXTRACTION, BILATERAL    . CERVICAL FUSION    . CHOLECYSTECTOMY    . KYPHOPLASTY  09/19/2015   Megan Williams  T12  . LUMBAR DISC SURGERY  11/11/2017   L3-4  . Thumb surg    . THYROIDECTOMY    . TONSILLECTOMY    . VAGINAL HYSTERECTOMY      FAMILY HISTORY: Family History  Adopted: Yes  Family history unknown: Yes    SOCIAL HISTORY: Social History   Socioeconomic History  . Marital status: Married    Spouse name: Not on file  . Number of children: 2  . Years of education: 54  . Highest education level: Not on file  Occupational History  . Occupation: Retired  Tobacco Use  . Smoking status: Current Every Day Smoker  . Smokeless tobacco: Never Used  . Tobacco comment: 3  cigarettes daily  Vaping Use  . Vaping Use: Never used  Substance and Sexual Activity  . Alcohol use: Yes    Alcohol/week: 1.0 standard drink    Types: 1 Glasses of wine per week    Comment: dinner or bed   . Drug use: No  . Sexual activity: Not Currently    Birth control/protection: Surgical    Comment: INTERCOURSE AGE 16, SEXUAL PARTNERS LESS THAN 5  Other Topics Concern  . Not on file  Social History Narrative   Lives at home with husband.   Right-handed.   1 cup caffeine daily.   Social Determinants of Health   Financial Resource Strain: Not on file  Food Insecurity: Not on file  Transportation Needs: Not on file  Physical Activity: Not on file  Stress: Not on file  Social Connections: Not on file  Intimate Partner Violence: Not on file   PHYSICAL EXAM  Vitals:   05/21/20 0952  BP: 125/64  Pulse: 67  Weight: 123 lb (55.8 kg)  Height: 5\' 2"  (1.575 m)  Body mass  index is 22.5 kg/m.  Generalized: Well developed, in no acute distress  MMSE - Mini Mental State Exam 05/21/2020 05/22/2019 05/10/2018  Orientation to time 5 5 5   Orientation to Place 5 5 5   Registration 3 3 3   Attention/ Calculation 5 5 5   Recall 3 3 3   Language- name 2 objects 2 2 2   Language- repeat 1 1 1   Language- follow 3 step command 3 3 3   Language- read & follow direction 1 1 1   Write a sentence 1 1 1   Copy design 1 1 1   Total score 30 30 30     Neurological examination  Mentation: Alert oriented to time, place, history taking. Follows all commands speech and language fluent Cranial nerve II-XII: Pupils were equal round reactive to light. Extraocular movements were full, visual field were full on confrontational test. Facial sensation and strength were normal. Head turning and shoulder shrug  were normal and symmetric. Motor: The motor testing reveals 5 over 5 strength of all 4 extremities. Good symmetric motor tone is noted throughout.   Sensory: Sensory testing is intact to soft touch on all 4 extremities. No evidence of extinction is noted.  Coordination: Mild tremor with finger-nose-finger bilaterally. Gait and station: Gait is cautious, postsurgical, wearing back brace Reflexes: Deep tendon reflexes are symmetric and normal bilaterally.   DIAGNOSTIC DATA (LABS, IMAGING, TESTING) - I reviewed patient records, labs, notes, testing and imaging myself where available.  Lab Results  Component Value Date   WBC 10.5 05/02/2020   HGB 10.9 (L) 05/02/2020   HCT 32.7 (L) 05/02/2020   MCV 100.0 05/02/2020   PLT 212 05/02/2020      Component Value Date/Time   NA 138 05/20/2020 1619   K 4.5 05/20/2020 1619   CL 103 05/20/2020 1619   CO2 32 05/20/2020 1619   GLUCOSE 92 05/20/2020 1619   BUN 11 05/20/2020 1619   CREATININE 1.07 (H) 05/20/2020 1619   CALCIUM 9.3 05/20/2020 1619   PROT 6.3 05/20/2020 1619   ALBUMIN 4.0 10/07/2016 0925   AST 13 05/20/2020 1619   ALT 6  05/20/2020 1619   ALKPHOS 49 10/07/2016 0925   BILITOT 0.4 05/20/2020 1619   GFRNONAA 52 (L) 05/20/2020 1619   GFRAA 60 05/20/2020 1619   Lab Results  Component Value Date   CHOL 183 05/16/2020   HDL 42 (L) 05/16/2020   LDLCALC 116 (H) 05/16/2020   TRIG 134 05/16/2020   CHOLHDL 4.4 05/16/2020   Lab Results  Component Value Date   HGBA1C 4.9 10/07/2016   Lab Results  Component Value Date   VITAMINB12 >2,000 (H) 10/15/2019   Lab Results  Component Value Date   TSH 0.83 05/16/2020    ASSESSMENT AND PLAN 75 y.o. year old female  has a past medical history of Allergy, Anxiety, Arthritis, Complication of anesthesia, COPD (chronic obstructive pulmonary disease) ([place]), Depression, Emphysema, Esophagitis, Herpes simplex, Hypertension, Hypothyroidism, Migraines, Osteopenia, and Vitamin D deficiency. here with:  1.  Essential tremor -Much improved with propanolol -Continue propanolol 40 mg twice a day  2.  Mild cognitive impairment -Memory remains stable, 30/30 -Continue Namenda 10 mg twice a day  -Recovering from recent back surgery, bad memory days due to back pain -Follow-up in 1 year or sooner if needed  I spent 20 minutes of face-to-face and non-face-to-face time with patient.  This included previsit chart review, lab review, study review, order entry, electronic health record documentation, patient education.  Butler Denmark, AGNP-C, DNP  05/21/2020, 10:12 AM [facility] [address], [place], [place] [address] [phone]

## 2020-05-21 ENCOUNTER — Ambulatory Visit: Payer: PPO | Admitting: Neurology

## 2020-05-21 ENCOUNTER — Encounter: Payer: Self-pay | Admitting: Neurology

## 2020-05-21 VITALS — BP 125/64 | HR 67 | Ht 62.0 in | Wt 123.0 lb

## 2020-05-21 DIAGNOSIS — R251 Tremor, unspecified: Secondary | ICD-10-CM | POA: Diagnosis not present

## 2020-05-21 DIAGNOSIS — F09 Unspecified mental disorder due to known physiological condition: Secondary | ICD-10-CM

## 2020-05-21 DIAGNOSIS — G250 Essential tremor: Secondary | ICD-10-CM | POA: Insufficient documentation

## 2020-05-21 DIAGNOSIS — K219 Gastro-esophageal reflux disease without esophagitis: Secondary | ICD-10-CM | POA: Insufficient documentation

## 2020-05-21 LAB — COMPLETE METABOLIC PANEL WITH GFR
AG Ratio: 1.5 (calc) (ref 1.0–2.5)
ALT: 6 U/L (ref 6–29)
AST: 13 U/L (ref 10–35)
Albumin: 3.8 g/dL (ref 3.6–5.1)
Alkaline phosphatase (APISO): 64 U/L (ref 37–153)
BUN/Creatinine Ratio: 10 (calc) (ref 6–22)
BUN: 11 mg/dL (ref 7–25)
CO2: 32 mmol/L (ref 20–32)
Calcium: 9.3 mg/dL (ref 8.6–10.4)
Chloride: 103 mmol/L (ref 98–110)
Creat: 1.07 mg/dL — ABNORMAL HIGH (ref 0.60–0.93)
GFR, Est African American: 60 mL/min/{1.73_m2} (ref >=60)
GFR, Est Non African American: 52 mL/min/{1.73_m2} — ABNORMAL LOW (ref >=60)
Globulin: 2.5 g/dL (calc) (ref 1.9–3.7)
Glucose, Bld: 92 mg/dL (ref 65–99)
Potassium: 4.5 mmol/L (ref 3.5–5.3)
Sodium: 138 mmol/L (ref 135–146)
Total Bilirubin: 0.4 mg/dL (ref 0.2–1.2)
Total Protein: 6.3 g/dL (ref 6.1–8.1)

## 2020-05-21 LAB — VITAMIN D 25 HYDROXY (VIT D DEFICIENCY, FRACTURES): Vit D, 25-Hydroxy: 42 ng/mL (ref 30–100)

## 2020-05-21 MED ORDER — PROPRANOLOL HCL 40 MG PO TABS: 40.0000 mg | ORAL_TABLET | Freq: Two times a day (BID) | ORAL | 4 refills | Status: DC

## 2020-05-21 MED ORDER — MEMANTINE HCL 10 MG PO TABS: 10.0000 mg | ORAL_TABLET | Freq: Two times a day (BID) | ORAL | 4 refills | Status: DC

## 2020-05-21 NOTE — Patient Instructions (Signed)
I hope you to recover Continue current medications from out office  See you back in 1 year

## 2020-05-21 NOTE — Patient Instructions (Signed)
You are being referred to [facility] regarding issues with creatinine which are mild.  Continue current medications.  Your vitamin D level is normal.  Labs will be faxed to Dr. Layne Benton.  Continue current medications and follow-up with neurology as indicated.  Return here in August for Medicare wellness visit.

## 2020-05-26 ENCOUNTER — Ambulatory Visit: Payer: PPO | Admitting: Occupational Therapy

## 2020-05-26 ENCOUNTER — Encounter: Payer: Self-pay | Admitting: Occupational Therapy

## 2020-05-26 ENCOUNTER — Other Ambulatory Visit: Payer: Self-pay

## 2020-05-26 DIAGNOSIS — M5441 Lumbago with sciatica, right side: Secondary | ICD-10-CM

## 2020-05-26 DIAGNOSIS — R278 Other lack of coordination: Secondary | ICD-10-CM

## 2020-05-26 DIAGNOSIS — M6281 Muscle weakness (generalized): Secondary | ICD-10-CM

## 2020-05-26 DIAGNOSIS — M5442 Lumbago with sciatica, left side: Secondary | ICD-10-CM

## 2020-05-26 NOTE — Patient Instructions (Signed)

## 2020-05-26 NOTE — Therapy (Signed)
[place]. [place], [place], [address] Phone: [phone]   Fax:  [phone]  Occupational Therapy Treatment  Patient Details  Name: Megan Williams MRN: [id number] Date of Birth: 1945/06/23 Referring Provider (OT): Kristeen Miss, MD   Encounter Date: 05/26/2020   OT End of Session - 05/26/20 1108    Visit Number 2    Number of Visits 21    Date for OT Re-Evaluation 07/18/20    Authorization Type [employer]    Progress Note Due on Visit 10    OT Start Time 1100    OT Stop Time 1143    OT Time Calculation (min) 43 min    Activity Tolerance Patient tolerated treatment well;Patient limited by pain    Behavior During Therapy [facility] for tasks assessed/performed;Anxious           Past Medical History:  Diagnosis Date  . Allergy   . Anxiety   . Arthritis   . Complication of anesthesia    "difficulty waking up. I could hear them but I couldn't wake up"  . COPD (chronic obstructive pulmonary disease) ([place])   . Depression   . Emphysema   . Esophagitis   . Herpes simplex   . Hypertension   . Hypothyroidism   . Migraines   . Osteopenia   . Vitamin D deficiency     Past Surgical History:  Procedure Laterality Date  . BACK SURGERY  02/14/2018   lumbar 3-4  fusion  . BREAST EXCISIONAL BIOPSY Left 1979   Benign   . CATARACT EXTRACTION, BILATERAL    . CERVICAL FUSION    . CHOLECYSTECTOMY    . KYPHOPLASTY  09/19/2015   Dr. Ellene Route  T12  . LUMBAR DISC SURGERY  11/11/2017   L3-4  . Thumb surg    . THYROIDECTOMY    . TONSILLECTOMY    . VAGINAL HYSTERECTOMY      There were no vitals filed for this visit.   Subjective Assessment - 05/26/20 1106    Subjective  Pt reports she took Gabapentin today around 9:45am; she states it has not been making her feel "loopy" lately    Pertinent History Mild neurocognitive disorder, essential tremor, osteopenia, arthritis, emphysema, COPD, depression, anxiety     Patient Stated Goals "I want to get back to being me"    Currently in Pain? Yes    Pain Score 5     Pain Location Back    Pain Orientation Right;Lower    Pain Descriptors / Indicators Aching    Pain Radiating Towards R lower back and R buttock    Pain Onset More than a month ago    Pain Frequency Constant            OT Treatments/Exercises (OP) - 05/26/20 1145      ADLs   LB Dressing Pt reported she has started completing dressing at home independently; when demonstrating, pt attempted activity in standing while holding on to secured tabletop; pt CGA during activity. Due to slight LOB, OT demonstrated/provided education on compensatory strategy of cross-legged method while in seated position; pt returned demonstration w/out difficulty and reported occasionally using this method at home    [employer] provided on benefits of long-handled sponge during bathing    Increased Safety Strategies Energy conservation strategies (pacing, prioritizing, planning, positioning) and benefits discussed at length, particularly in regards to bathing and IADL/housekeeping activities; OT also introduced fall prevention strategies and reviewed  medical precautions    Compensatory Strategies OT problem-solved w/ pt to find most effective compensatory strategy for loading/unloading top rack of dishwasher (unable to reach both rack at this time due to medical precautions)            OT Education - 05/26/20 1845    Education Details Education provided on energy conservation strategies and introductory fall prevention    Person(s) Educated Patient;Spouse    Methods Explanation;Handout    Comprehension Verbalized understanding            OT Short Term Goals - 05/26/20 1109      OT SHORT TERM GOAL #1   Title Pt will independently identify at least 2 compensatory or energy conservation strategies, including use of AE, to implement during BADLs    Baseline No compensatory strategies  at this time    Time 5    Period Weeks    Status On-going    Target Date 06/24/20      OT SHORT TERM GOAL #2   Title Pt will thread BLEs into pants/bottoms w/ Min A, using AE prn, at least 75% of the time    Baseline Unable to thread BLEs    Time 5    Period Weeks    Status On-going      OT SHORT TERM GOAL #3   Title Pt will pull bottoms up once legs are threaded w/ Mod I    Baseline Min A to pull pants up once threaded    Time 5    Period Weeks    Status On-going            OT Long Term Goals - 05/26/20 1111      OT LONG TERM GOAL #1   Title Pt will demonstrate understanding of appropriate body mechanics and adaptive strategies to assist w/ pain management and safety during BADLs    Baseline Decreased knowledge of adaptive strategies and body mechanics    Time 10    Period Weeks    Status On-going      OT LONG TERM GOAL #2   Title Pt will safely participate in complete LB dressing w/ SPV    Baseline Max A w/ LB dressing    Time 10    Period Weeks    Status On-going      OT LONG TERM GOAL #3   Title Pt will safely transfer in <> out of shower w/ CGA    Baseline Min A w/ tub/shower transfer    Time 10    Period Weeks    Status On-going                 Plan - 05/26/20 1108    Clinical Impression Statement Pt appeared more coherent and oriented today compared w/ evaluation and was able to answer relevant questions more appropriately. Due to pt's report that she gets fatigued frequently and cannot participate in many activities at home for prolonged periods of time before experiencing pain, OT session focused on energy conservation strategies and compensatory techniques to incorporate during ADLs and IADLs with discussion and demonstration/returned demo of strategies.    OT Occupational Profile and History Detailed Assessment- Review of Records and additional review of physical, cognitive, psychosocial history related to current functional performance     Occupational performance deficits (Please refer to evaluation for details): ADL's;IADL's;Leisure;Social Participation    Body Structure / Function / Physical Skills ADL;Decreased knowledge of precautions;Balance;UE functional use;Decreased knowledge of use of DME;FMC;Dexterity;Body  mechanics;GMC;Strength;Pain;Endurance;Coordination;IADL    Cognitive Skills Attention;Memory;Problem Solve;Safety Awareness    Rehab Potential Good    Clinical Decision Making Multiple treatment options, significant modification of task necessary    Comorbidities Affecting Occupational Performance: Presence of comorbidities impacting occupational performance    Modification or Assistance to Complete Evaluation  Min-Moderate modification of tasks or assist with assess necessary to complete eval    OT Frequency 2x / week    OT Duration Other (comment)   10 weeks   OT Treatment/Interventions Self-care/ADL training;Therapeutic exercise;Coping strategies training;Aquatic Therapy;Moist Heat;Neuromuscular education;Patient/family education;Energy conservation;Therapeutic activities;Cryotherapy;DME and/or AE instruction;Manual Therapy;Passive range of motion;Cognitive remediation/compensation    Plan Review energy conservation; body mechanics during ADLs    Consulted and Agree with Plan of Care Patient           Patient will benefit from skilled therapeutic intervention in order to improve the following deficits and impairments:   Body Structure / Function / Physical Skills: ADL,Decreased knowledge of precautions,Balance,UE functional use,Decreased knowledge of use of DME,FMC,Dexterity,Body mechanics,GMC,Strength,Pain,Endurance,Coordination,IADL Cognitive Skills: Attention,Memory,Problem Solve,Safety Awareness     Visit Diagnosis: Acute bilateral low back pain with bilateral sciatica  Muscle weakness (generalized)  Other lack of coordination    Problem List Patient Active Problem List   Diagnosis Date Noted  .  Essential tremor 05/21/2020  . GERD (gastroesophageal reflux disease) 05/21/2020  . Lumbar stenosis with neurogenic claudication 02/14/2018  . Mild cognitive impairment 01/29/2015  . Degenerative joint disease of cervical spine 01/29/2015  . Gait difficulty 01/06/2015  . Urinary urgency 01/06/2015  . COPD (chronic obstructive pulmonary disease) ([place]) 05/03/2011  . Osteopenia   . Migraines   . Hypertension 01/15/2011  . Insomnia 01/15/2011  . Hypothyroidism 09/17/2010  . Anxiety 09/17/2010  . Hyperlipidemia 09/17/2010  . History of smoking 09/17/2010     Kathrine Cords, OTR/L, MSOT 05/26/2020, 9:19 PM  [place]. [place], [place], [address] Phone: [phone]   Fax:  [phone]  Name: Megan Williams MRN: [id number] Date of Birth: 1945-11-18

## 2020-05-29 ENCOUNTER — Ambulatory Visit: Payer: PPO

## 2020-05-29 ENCOUNTER — Encounter: Payer: Self-pay | Admitting: Occupational Therapy

## 2020-05-29 ENCOUNTER — Ambulatory Visit: Payer: PPO | Admitting: Occupational Therapy

## 2020-05-29 ENCOUNTER — Other Ambulatory Visit: Payer: Self-pay

## 2020-05-29 DIAGNOSIS — M5442 Lumbago with sciatica, left side: Secondary | ICD-10-CM

## 2020-05-29 DIAGNOSIS — M6281 Muscle weakness (generalized): Secondary | ICD-10-CM

## 2020-05-29 DIAGNOSIS — R262 Difficulty in walking, not elsewhere classified: Secondary | ICD-10-CM

## 2020-05-29 DIAGNOSIS — R278 Other lack of coordination: Secondary | ICD-10-CM

## 2020-05-29 NOTE — Therapy (Signed)
[place]. [place], [place], [address] Phone: [phone]   Fax:  [phone]  Occupational Therapy Treatment  Patient Details  Name: Megan Williams MRN: [id number] Date of Birth: 12-09-1945 Referring Provider (OT): Kristeen Miss, MD   Encounter Date: 05/29/2020   OT End of Session - 05/29/20 1409    Visit Number 3    Number of Visits 21    Date for OT Re-Evaluation 07/18/20    Authorization Type [employer]    Progress Note Due on Visit 10    OT Start Time 1400    OT Stop Time 1444    OT Time Calculation (min) 44 min    Activity Tolerance Patient tolerated treatment well    Behavior During Therapy [facility] for tasks assessed/performed           Past Medical History:  Diagnosis Date  . Allergy   . Anxiety   . Arthritis   . Complication of anesthesia    "difficulty waking up. I could hear them but I couldn't wake up"  . COPD (chronic obstructive pulmonary disease) ([place])   . Depression   . Emphysema   . Esophagitis   . Herpes simplex   . Hypertension   . Hypothyroidism   . Migraines   . Osteopenia   . Vitamin D deficiency     Past Surgical History:  Procedure Laterality Date  . BACK SURGERY  02/14/2018   lumbar 3-4  fusion  . BREAST EXCISIONAL BIOPSY Left 1979   Benign   . CATARACT EXTRACTION, BILATERAL    . CERVICAL FUSION    . CHOLECYSTECTOMY    . KYPHOPLASTY  09/19/2015   Dr. Ellene Route  T12  . LUMBAR DISC SURGERY  11/11/2017   L3-4  . Thumb surg    . THYROIDECTOMY    . TONSILLECTOMY    . VAGINAL HYSTERECTOMY      There were no vitals filed for this visit.   Subjective Assessment - 05/29/20 1405    Subjective  Pt reports today is the first day she is trying to make it through the day without any pain medication or muscle relaxers    Pertinent History Mild neurocognitive disorder, essential tremor, osteopenia, arthritis, emphysema, COPD, depression, anxiety    Patient Stated Goals  "I want to get back to being me"    Currently in Pain? Yes    Pain Score 6     Pain Location Back    Pain Orientation Right;Lower    Pain Descriptors / Indicators Aching    Pain Type Acute pain    Pain Radiating Towards R buttock and upper leg    Pain Onset More than a month ago    Pain Frequency Constant            OT Treatments/Exercises (OP) - 05/29/20 1443      ADLs   Long-handled Shoe Horn Due to pt's report of difficulty tying shoes after donning them, OT problem-solved w/ pt to find most effective method while maintaining precautions. Education provided on use of long-handled shoe horn including demonstration w/ pt-returned demo; handout provided to aid pt w/ finding options for self-purchase    Long-Handled [employer] Education provided on long-handled sponge, w/ pt returning demonstration of use; handout provided to assist pt w/ finding options for self-purchase    Increased Safety Strategies Education provided on importance of continued use of appropriate body mechanics for prevention of continued pain/discomfort  Compensatory Strategies Pt provided pictures of her kitchen set-up and OT reviewed/adjusted compensatory strategy for loading/unloading top rack of dishwasher accordingly (unable to reach both rack at this time due to medical precautions)      Shoulder Exercises: ROM/Strengthening   UBE (Upper Arm Bike) 5 minutes            OT Short Term Goals - 05/26/20 1109      OT SHORT TERM GOAL #1   Title Pt will independently identify at least 2 compensatory or energy conservation strategies, including use of AE, to implement during BADLs    Baseline No compensatory strategies at this time    Time 5    Period Weeks    Status On-going    Target Date 06/24/20      OT SHORT TERM GOAL #2   Title Pt will thread BLEs into pants/bottoms w/ Min A, using AE prn, at least 75% of the time    Baseline Unable to thread BLEs    Time 5    Period Weeks    Status On-going       OT SHORT TERM GOAL #3   Title Pt will pull bottoms up once legs are threaded w/ Mod I    Baseline Min A to pull pants up once threaded    Time 5    Period Weeks    Status On-going            OT Long Term Goals - 05/26/20 1111      OT LONG TERM GOAL #1   Title Pt will demonstrate understanding of appropriate body mechanics and adaptive strategies to assist w/ pain management and safety during BADLs    Baseline Decreased knowledge of adaptive strategies and body mechanics    Time 10    Period Weeks    Status On-going      OT LONG TERM GOAL #2   Title Pt will safely participate in complete LB dressing w/ SPV    Baseline Max A w/ LB dressing    Time 10    Period Weeks    Status On-going      OT LONG TERM GOAL #3   Title Pt will safely transfer in <> out of shower w/ CGA    Baseline Min A w/ tub/shower transfer    Time 10    Period Weeks    Status On-going            Plan - 05/29/20 1410    Clinical Impression Statement Session focused on providing education for safe participation in ADLs/IADLs including donning/tying shoes, bathing, and unloading/loading the dishwasher; OT provided handouts to facilitate self-purchase of long-handled sponge and shoe horn prn. OT also had pt complete 5 min on UE ergometer to facilitate generalized endurance prior to transition to PT at conclusion of session.    OT Occupational Profile and History Detailed Assessment- Review of Records and additional review of physical, cognitive, psychosocial history related to current functional performance    Occupational performance deficits (Please refer to evaluation for details): ADL's;IADL's;Leisure;Social Participation    Body Structure / Function / Physical Skills ADL;Decreased knowledge of precautions;Balance;UE functional use;Decreased knowledge of use of DME;FMC;Dexterity;Body mechanics;GMC;Strength;Pain;Endurance;Coordination;IADL    Cognitive Skills Attention;Memory;Problem Solve;Safety  Awareness    Rehab Potential Good    Clinical Decision Making Multiple treatment options, significant modification of task necessary    Comorbidities Affecting Occupational Performance: Presence of comorbidities impacting occupational performance    Modification or Assistance to Complete Evaluation  Min-Moderate modification of tasks or assist with assess necessary to complete eval    OT Frequency 2x / week    OT Duration Other (comment)   10 weeks   OT Treatment/Interventions Self-care/ADL training;Therapeutic exercise;Coping strategies training;Aquatic Therapy;Moist Heat;Neuromuscular education;Patient/family education;Energy conservation;Therapeutic activities;Cryotherapy;DME and/or AE instruction;Manual Therapy;Passive range of motion;Cognitive remediation/compensation    Plan Review energy conservation; compensatory strategies    Consulted and Agree with Plan of Care Patient           Patient will benefit from skilled therapeutic intervention in order to improve the following deficits and impairments:   Body Structure / Function / Physical Skills: ADL,Decreased knowledge of precautions,Balance,UE functional use,Decreased knowledge of use of DME,FMC,Dexterity,Body mechanics,GMC,Strength,Pain,Endurance,Coordination,IADL Cognitive Skills: Attention,Memory,Problem Solve,Safety Awareness     Visit Diagnosis: Acute bilateral low back pain with bilateral sciatica  Muscle weakness (generalized)  Other lack of coordination    Problem List Patient Active Problem List   Diagnosis Date Noted  . Essential tremor 05/21/2020  . GERD (gastroesophageal reflux disease) 05/21/2020  . Lumbar stenosis with neurogenic claudication 02/14/2018  . Mild cognitive impairment 01/29/2015  . Degenerative joint disease of cervical spine 01/29/2015  . Gait difficulty 01/06/2015  . Urinary urgency 01/06/2015  . COPD (chronic obstructive pulmonary disease) ([place]) 05/03/2011  . Osteopenia   . Migraines    . Hypertension 01/15/2011  . Insomnia 01/15/2011  . Hypothyroidism 09/17/2010  . Anxiety 09/17/2010  . Hyperlipidemia 09/17/2010  . History of smoking 09/17/2010     Kathrine Cords, OTR/L, MSOT 05/29/2020, 5:57 PM  [place]. [place], [place], [address] Phone: [phone]   Fax:  [phone]  Name: Megan Williams MRN: [id number] Date of Birth: 1945/06/18

## 2020-05-29 NOTE — Therapy (Signed)
[place]. [place], [place], [address] Phone: [phone]   Fax:  [phone]  Physical Therapy Treatment  Patient Details  Name: Megan Williams MRN: [id number] Date of Birth: 16-Jun-1945 Referring Provider (PT): [place]   Encounter Date: 05/29/2020   PT End of Session - 05/29/20 1450    Visit Number 2    Date for PT Re-Evaluation 07/19/20    Authorization Type [employer]    PT Start Time 2952    PT Stop Time 1530    PT Time Calculation (min) 45 min    Equipment Utilized During Treatment Gait belt    Activity Tolerance Patient tolerated treatment well    Behavior During Therapy [facility] for tasks assessed/performed           Past Medical History:  Diagnosis Date  . Allergy   . Anxiety   . Arthritis   . Complication of anesthesia    "difficulty waking up. I could hear them but I couldn't wake up"  . COPD (chronic obstructive pulmonary disease) ([place])   . Depression   . Emphysema   . Esophagitis   . Herpes simplex   . Hypertension   . Hypothyroidism   . Migraines   . Osteopenia   . Vitamin D deficiency     Past Surgical History:  Procedure Laterality Date  . BACK SURGERY  02/14/2018   lumbar 3-4  fusion  . BREAST EXCISIONAL BIOPSY Left 1979   Benign   . CATARACT EXTRACTION, BILATERAL    . CERVICAL FUSION    . CHOLECYSTECTOMY    . KYPHOPLASTY  09/19/2015   Dr. Ellene Route  T12  . LUMBAR DISC SURGERY  11/11/2017   L3-4  . Thumb surg    . THYROIDECTOMY    . TONSILLECTOMY    . VAGINAL HYSTERECTOMY      There were no vitals filed for this visit.   Subjective Assessment - 05/29/20 1448    Subjective Still wearing back brace. Is doing home exercises. Reports she didnt take any of her pain meds/ms relaxers today bcause she wanted to see how the pain felt without it - reports it feeling the same.    Pertinent History COPD, Depression, multiple spinal surgeries    Limitations  Sitting;Lifting;Standing;Walking;House hold activities    Patient Stated Goals be stronger, have better balance, less pain, better activity tolerance    Currently in Pain? Yes    Pain Score 6     Pain Location Back    Pain Orientation Right;Lower    Pain Descriptors / Indicators Aching                             [facility] Adult PT Treatment/Exercise - 05/29/20 1451      High Level Balance   High Level Balance Activities Side stepping along length of mat table with BUE support x 6 laps      Exercises   Exercises Knee/Hip;Lumbar;Ankle      Lumbar Exercises: Standing   Heel Raises 20 reps      Lumbar Exercises: Seated   Long Arc Quad on Chair Both;2 sets;10 reps    Other Seated Lumbar Exercises Ab bracing 5 x 5 sec in sitting      Knee/Hip Exercises: Standing   Other Standing Knee Exercises BUE supported standing mini marches 10 x 2 B, lateral toe taps 5x2      Knee/Hip Exercises:  Seated   Ball Squeeze 10 x 2 with purple ball with 2-3 sec holds    Marching AROM;Both;2 sets;10 reps    Abduction/Adduction  Strengthening;Both;2 sets;10 reps    Abd/Adduction Limitations red TB      Ankle Exercises: SEATED   Toe Raise 20 reps         Heel Raises 20 reps;Both                    PT Short Term Goals - 05/19/20 1542      PT SHORT TERM GOAL #1   Title independent with initial HEP    Time 3    Period Weeks    Status New             PT Long Term Goals - 05/19/20 1542      PT LONG TERM GOAL #1   Title understand posture, body mechanics and restrictions with her surgery    Time 12    Period Weeks    Status New      PT LONG TERM GOAL #2   Title decresae pain 25%    Time 12    Period Weeks    Status New      PT LONG TERM GOAL #3   Title increase lumbar ROM 25%    Time 12    Period Weeks    Status New      PT LONG TERM GOAL #4   Title decrease TUG to 25 seconds    Time 12    Period Weeks    Status New      PT LONG TERM GOAL #5    Title decrese 5x STS to 25 seconds    Time 12    Period Weeks    Status New                 Plan - 05/29/20 1455    Clinical Impression Statement Tolerated all exercises well today. Reviewed initial HEP exercises wit excellent caryover. She tolerated other seated and standing strength exercises well with brace donned for entirety of session, no c/o incrreased back pain. She will benefit from continued strength and balance training, core stabilization    Personal Factors and Comorbidities Comorbidity 3+    Comorbidities depression, COPD, multiple spinal surgeries    Rehab Potential Good    PT Frequency 2x / week    PT Duration 12 weeks    PT Treatment/Interventions ADLs/Self Care Home Management;Electrical Stimulation;Moist Heat;Cryotherapy;Gait training;Stair training;Functional mobility training;Therapeutic activities;Therapeutic exercise;Balance training;Neuromuscular re-education;Patient/family education;Manual techniques    PT Next Visit Plan slowly progress strength, function and balance    Consulted and Agree with Plan of Care Patient           Patient will benefit from skilled therapeutic intervention in order to improve the following deficits and impairments:  Abnormal gait,Decreased range of motion,Difficulty walking,Cardiopulmonary status limiting activity,Increased muscle spasms,Pain,Decreased activity tolerance,Impaired flexibility,Decreased balance,Improper body mechanics,Postural dysfunction,Decreased strength,Decreased mobility  Visit Diagnosis: Acute bilateral low back pain with bilateral sciatica  Muscle weakness (generalized)  Other lack of coordination  Difficulty in walking, not elsewhere classified     Problem List Patient Active Problem List   Diagnosis Date Noted  . Essential tremor 05/21/2020  . GERD (gastroesophageal reflux disease) 05/21/2020  . Lumbar stenosis with neurogenic claudication 02/14/2018  . Mild cognitive impairment 01/29/2015   . Degenerative joint disease of cervical spine 01/29/2015  . Gait difficulty 01/06/2015  . Urinary urgency 01/06/2015  . COPD (  chronic obstructive pulmonary disease) ([place]) 05/03/2011  . Osteopenia   . Migraines   . Hypertension 01/15/2011  . Insomnia 01/15/2011  . Hypothyroidism 09/17/2010  . Anxiety 09/17/2010  . Hyperlipidemia 09/17/2010  . History of smoking 09/17/2010    Hall Busing, PT, DPT 05/29/2020, 3:40 PM  [place]. [place], [place], [address] Phone: [phone]   Fax:  [phone]  Name: Megan Williams MRN: [id number] Date of Birth: 05/03/1945

## 2020-06-03 ENCOUNTER — Encounter: Payer: Self-pay | Admitting: Physical Therapy

## 2020-06-03 ENCOUNTER — Ambulatory Visit: Payer: PPO | Attending: Neurological Surgery | Admitting: Occupational Therapy

## 2020-06-03 ENCOUNTER — Ambulatory Visit: Payer: PPO | Admitting: Physical Therapy

## 2020-06-03 ENCOUNTER — Other Ambulatory Visit: Payer: Self-pay

## 2020-06-03 DIAGNOSIS — R262 Difficulty in walking, not elsewhere classified: Secondary | ICD-10-CM

## 2020-06-03 DIAGNOSIS — M5442 Lumbago with sciatica, left side: Secondary | ICD-10-CM | POA: Diagnosis not present

## 2020-06-03 DIAGNOSIS — R278 Other lack of coordination: Secondary | ICD-10-CM

## 2020-06-03 DIAGNOSIS — M6281 Muscle weakness (generalized): Secondary | ICD-10-CM

## 2020-06-03 DIAGNOSIS — M5441 Lumbago with sciatica, right side: Secondary | ICD-10-CM

## 2020-06-03 NOTE — Therapy (Signed)
[place]. [place], [place], [address] Phone: [phone]   Fax:  [phone]  Occupational Therapy Treatment  Patient Details  Name: Megan Williams MRN: [id number] Date of Birth: [birth year] Referring Provider (OT): Kristeen Miss, MD   Encounter Date: 06/03/2020   OT End of Session - 06/03/20 1410    Visit Number 4    Number of Visits 21    Date for OT Re-Evaluation 07/18/20    Authorization Type [employer]    Progress Note Due on Visit 10    OT Start Time 1400    OT Stop Time 1440   Session concluded early due to pt demonstration of confusion and decreased attention   OT Time Calculation (min) 40 min    Activity Tolerance Patient tolerated treatment well    Behavior During Therapy [facility] for tasks assessed/performed   Pt demeanor different from baseline, particularly toward conclusion of session          Past Medical History:  Diagnosis Date  . Allergy   . Anxiety   . Arthritis   . Complication of anesthesia    "difficulty waking up. I could hear them but I couldn't wake up"  . COPD (chronic obstructive pulmonary disease) ([place])   . Depression   . Emphysema   . Esophagitis   . Herpes simplex   . Hypertension   . Hypothyroidism   . Migraines   . Osteopenia   . Vitamin D deficiency     Past Surgical History:  Procedure Laterality Date  . BACK SURGERY  02/14/2018   lumbar 3-4  fusion  . BREAST EXCISIONAL BIOPSY Left 1979   Benign   . CATARACT EXTRACTION, BILATERAL    . CERVICAL FUSION    . CHOLECYSTECTOMY    . KYPHOPLASTY  09/19/2015   Dr. Ellene Route  T12  . LUMBAR DISC SURGERY  11/11/2017   L3-4  . Thumb surg    . THYROIDECTOMY    . TONSILLECTOMY    . VAGINAL HYSTERECTOMY      There were no vitals filed for this visit.   Subjective Assessment - 06/03/20 1401    Subjective  Pt reports she is feeling tired today and really has not been sleeping well    Pertinent History Mild  neurocognitive disorder, essential tremor, osteopenia, arthritis, emphysema, COPD, depression, anxiety    Patient Stated Goals "I want to get back to being me"    Currently in Pain? Yes    Pain Score 3     Pain Location Back    Pain Orientation Right;Lower    Pain Descriptors / Indicators Aching    Pain Radiating Towards R buttock and upper leg    Pain Onset More than a month ago    Pain Frequency Intermittent            OT Treatments/Exercises (OP) - 06/03/20 1416      ADLs   Grooming OT discussed compensatory strategies for shaving BLEs while maintaining precautions; OT requested pt return demonstration for cross-legged method to be completed in seated position    Compensatory Strategies OT provided education on various sleeping positions to help encourage proper body alignment during sleep and restful sleep            OT Education - 06/03/20 1456    Education Details Education provided on healthy sleep positions and compensatory techniques for shaving BLEs.    Person(s) Educated Patient    Methods Explanation;Demonstration  Comprehension Verbalized understanding;Returned demonstration            OT Short Term Goals - 05/26/20 1109      OT SHORT TERM GOAL #1   Title Pt will independently identify at least 2 compensatory or energy conservation strategies, including use of AE, to implement during BADLs    Baseline No compensatory strategies at this time    Time 5    Period Weeks    Status On-going    Target Date 06/24/20      OT SHORT TERM GOAL #2   Title Pt will thread BLEs into pants/bottoms w/ Min A, using AE prn, at least 75% of the time    Baseline Unable to thread BLEs    Time 5    Period Weeks    Status On-going      OT SHORT TERM GOAL #3   Title Pt will pull bottoms up once legs are threaded w/ Mod I    Baseline Min A to pull pants up once threaded    Time 5    Period Weeks    Status On-going            OT Long Term Goals - 05/26/20 1111       OT LONG TERM GOAL #1   Title Pt will demonstrate understanding of appropriate body mechanics and adaptive strategies to assist w/ pain management and safety during BADLs    Baseline Decreased knowledge of adaptive strategies and body mechanics    Time 10    Period Weeks    Status On-going      OT LONG TERM GOAL #2   Title Pt will safely participate in complete LB dressing w/ SPV    Baseline Max A w/ LB dressing    Time 10    Period Weeks    Status On-going      OT LONG TERM GOAL #3   Title Pt will safely transfer in <> out of shower w/ CGA    Baseline Min A w/ tub/shower transfer    Time 10    Period Weeks    Status On-going            Plan - 06/03/20 1411    Clinical Impression Statement OT reviewed previously discussed compensatory strategies regarding long-handled sponge and shoe-horn, which pt reports she has not carried over to home. OT also introduced compensatory methods for improving sleep positioning and for pt shaving her BLEs, with pt returning demo for shaving techniques. As session progressed, pt demonstrated some confusion as well as decreased attention and memory and required CGA when ambulating out of session. Due to pt reporting, "I took half a Xanax with my Gabapentin and it's the first time I've done that," OT provided education regarding importance of taking medicines as prescribed by her physician; OT discussed this w/ pt's spouse as well due to pt's demonstration of decreased memory toward conclusion of session.    OT Occupational Profile and History Detailed Assessment- Review of Records and additional review of physical, cognitive, psychosocial history related to current functional performance    Occupational performance deficits (Please refer to evaluation for details): ADL's;IADL's;Leisure;Social Participation    Body Structure / Function / Physical Skills ADL;Decreased knowledge of precautions;Balance;UE functional use;Decreased knowledge of use of  DME;FMC;Dexterity;Body mechanics;GMC;Strength;Pain;Endurance;Coordination;IADL    Cognitive Skills Attention;Memory;Problem Solve;Safety Awareness    Rehab Potential Good    Clinical Decision Making Multiple treatment options, significant modification of task necessary    Comorbidities Affecting  Occupational Performance: Presence of comorbidities impacting occupational performance    Modification or Assistance to Complete Evaluation  Min-Moderate modification of tasks or assist with assess necessary to complete eval    OT Frequency 2x / week    OT Duration Other (comment)   10 weeks   OT Treatment/Interventions Self-care/ADL training;Therapeutic exercise;Coping strategies training;Aquatic Therapy;Moist Heat;Neuromuscular education;Patient/family education;Energy conservation;Therapeutic activities;Cryotherapy;DME and/or AE instruction;Manual Therapy;Passive range of motion;Cognitive remediation/compensation    Plan ADL compensatory strategies    Consulted and Agree with Plan of Care Patient           Patient will benefit from skilled therapeutic intervention in order to improve the following deficits and impairments:   Body Structure / Function / Physical Skills: ADL,Decreased knowledge of precautions,Balance,UE functional use,Decreased knowledge of use of DME,FMC,Dexterity,Body mechanics,GMC,Strength,Pain,Endurance,Coordination,IADL Cognitive Skills: Attention,Memory,Problem Solve,Safety Awareness     Visit Diagnosis: Acute bilateral low back pain with bilateral sciatica  Muscle weakness (generalized)  Other lack of coordination    Problem List Patient Active Problem List   Diagnosis Date Noted  . Essential tremor 05/21/2020  . GERD (gastroesophageal reflux disease) 05/21/2020  . Lumbar stenosis with neurogenic claudication 02/14/2018  . Mild cognitive impairment 01/29/2015  . Degenerative joint disease of cervical spine 01/29/2015  . Gait difficulty 01/06/2015  . Urinary  urgency 01/06/2015  . COPD (chronic obstructive pulmonary disease) ([place]) 05/03/2011  . Osteopenia   . Migraines   . Hypertension 01/15/2011  . Insomnia 01/15/2011  . Hypothyroidism 09/17/2010  . Anxiety 09/17/2010  . Hyperlipidemia 09/17/2010  . History of smoking 09/17/2010    Kathrine Cords, OTR/L, MSOT 06/03/2020, 4:08 PM  [place]. [place], [place], [address] Phone: [phone]   Fax:  [phone]  Name: AARTI MANKOWSKI MRN: [id number] Date of Birth: 1945-09-29

## 2020-06-03 NOTE — Therapy (Signed)
[place]. [place], [place], [address] Phone: [phone]   Fax:  [phone]  Physical Therapy Treatment  Patient Details  Name: Megan Williams MRN: [id number] Date of Birth: [birth year] Referring Provider (PT): Elsner   Encounter Date: 06/03/2020   PT End of Session - 06/03/20 1416    Visit Number 3    Date for PT Re-Evaluation 07/19/20    PT Start Time 1316    PT Stop Time 1356    PT Time Calculation (min) 40 min    Activity Tolerance Patient tolerated treatment well    Behavior During Therapy [facility] for tasks assessed/performed           Past Medical History:  Diagnosis Date  . Allergy   . Anxiety   . Arthritis   . Complication of anesthesia    "difficulty waking up. I could hear them but I couldn't wake up"  . COPD (chronic obstructive pulmonary disease) ([place])   . Depression   . Emphysema   . Esophagitis   . Herpes simplex   . Hypertension   . Hypothyroidism   . Migraines   . Osteopenia   . Vitamin D deficiency     Past Surgical History:  Procedure Laterality Date  . BACK SURGERY  02/14/2018   lumbar 3-4  fusion  . BREAST EXCISIONAL BIOPSY Left 1979   Benign   . CATARACT EXTRACTION, BILATERAL    . CERVICAL FUSION    . CHOLECYSTECTOMY    . KYPHOPLASTY  09/19/2015   Dr. Ellene Route  T12  . LUMBAR DISC SURGERY  11/11/2017   L3-4  . Thumb surg    . THYROIDECTOMY    . TONSILLECTOMY    . VAGINAL HYSTERECTOMY      There were no vitals filed for this visit.   Subjective Assessment - 06/03/20 1316    Subjective Pt reports no new changes since last rx. States a little sore/tight in R buttocks    Currently in Pain? Yes    Pain Score 3     Pain Location Back    Pain Orientation Right;Lower                             [facility] Adult PT Treatment/Exercise - 06/03/20 0001      Lumbar Exercises: Aerobic   Nustep L3 x 6 min LE only      Lumbar Exercises: Seated   Long Arc Quad  on Chair Both;2 sets;10 reps    Other Seated Lumbar Exercises Ab bracing 5 x 5 sec in sitting    Other Seated Lumbar Exercises seated marches 2x10 B      Knee/Hip Exercises: Seated   Ball Squeeze x15 with purple ball with 2-3 sec holds    Abduction/Adduction  Strengthening;Both;2 sets;10 reps    Abd/Adduction Limitations red TB    Sit to Sand 2 sets;5 reps   holding ball to avoid reliance on UEs                   PT Short Term Goals - 06/03/20 1419      PT SHORT TERM GOAL #1   Title independent with initial HEP    Time 3    Period Weeks    Status Achieved             PT Long Term Goals - 05/19/20 1542      PT LONG  TERM GOAL #1   Title understand posture, body mechanics and restrictions with her surgery    Time 12    Period Weeks    Status New      PT LONG TERM GOAL #2   Title decresae pain 25%    Time 12    Period Weeks    Status New      PT LONG TERM GOAL #3   Title increase lumbar ROM 25%    Time 12    Period Weeks    Status New      PT LONG TERM GOAL #4   Title decrease TUG to 25 seconds    Time 12    Period Weeks    Status New      PT LONG TERM GOAL #5   Title decrese 5x STS to 25 seconds    Time 12    Period Weeks    Status New                 Plan - 06/03/20 1417    Clinical Impression Statement Pt tolerated progression of ex's well with no c/o increased LBP this rx. Needed some encouragement and cuing for STS without UE support; unable from standard chair but did well from raised mat table. Brace donned throughout session. Close supervision with gait around clinic d/t occasional swerving/near LOB. Continue to progress strength, balance training, and core stab.    PT Treatment/Interventions ADLs/Self Care Home Management;Electrical Stimulation;Moist Heat;Cryotherapy;Gait training;Stair training;Functional mobility training;Therapeutic activities;Therapeutic exercise;Balance training;Neuromuscular re-education;Patient/family  education;Manual techniques    PT Next Visit Plan slowly progress strength, function and balance    Consulted and Agree with Plan of Care Patient           Patient will benefit from skilled therapeutic intervention in order to improve the following deficits and impairments:  Abnormal gait,Decreased range of motion,Difficulty walking,Cardiopulmonary status limiting activity,Increased muscle spasms,Pain,Decreased activity tolerance,Impaired flexibility,Decreased balance,Improper body mechanics,Postural dysfunction,Decreased strength,Decreased mobility  Visit Diagnosis: Acute bilateral low back pain with bilateral sciatica  Muscle weakness (generalized)  Other lack of coordination  Difficulty in walking, not elsewhere classified     Problem List Patient Active Problem List   Diagnosis Date Noted  . Essential tremor 05/21/2020  . GERD (gastroesophageal reflux disease) 05/21/2020  . Lumbar stenosis with neurogenic claudication 02/14/2018  . Mild cognitive impairment 01/29/2015  . Degenerative joint disease of cervical spine 01/29/2015  . Gait difficulty 01/06/2015  . Urinary urgency 01/06/2015  . COPD (chronic obstructive pulmonary disease) ([place]) 05/03/2011  . Osteopenia   . Migraines   . Hypertension 01/15/2011  . Insomnia 01/15/2011  . Hypothyroidism 09/17/2010  . Anxiety 09/17/2010  . Hyperlipidemia 09/17/2010  . History of smoking 09/17/2010    Amador Cunas, PT, DPT Donald Prose Tarnisha Kachmar 06/03/2020, 2:20 PM  [place]. [place], [place], [address] Phone: [phone]   Fax:  [phone]  Name: Megan Williams MRN: [id number] Date of Birth: 1945/03/09

## 2020-06-05 ENCOUNTER — Ambulatory Visit: Payer: PPO | Admitting: Physical Therapy

## 2020-06-05 ENCOUNTER — Other Ambulatory Visit: Payer: Self-pay

## 2020-06-05 ENCOUNTER — Ambulatory Visit: Payer: PPO | Admitting: Occupational Therapy

## 2020-06-05 ENCOUNTER — Encounter: Payer: Self-pay | Admitting: Occupational Therapy

## 2020-06-05 ENCOUNTER — Encounter: Payer: Self-pay | Admitting: Physical Therapy

## 2020-06-05 DIAGNOSIS — M5442 Lumbago with sciatica, left side: Secondary | ICD-10-CM

## 2020-06-05 DIAGNOSIS — M6281 Muscle weakness (generalized): Secondary | ICD-10-CM

## 2020-06-05 DIAGNOSIS — R262 Difficulty in walking, not elsewhere classified: Secondary | ICD-10-CM

## 2020-06-05 DIAGNOSIS — M5441 Lumbago with sciatica, right side: Secondary | ICD-10-CM

## 2020-06-05 DIAGNOSIS — R278 Other lack of coordination: Secondary | ICD-10-CM

## 2020-06-05 NOTE — Therapy (Signed)
[place]. [place], [place], [address] Phone: [phone]   Fax:  [phone]  Physical Therapy Treatment  Patient Details  Name: Megan Williams MRN: [id number] Date of Birth: [birth year] Referring Provider (PT): Elsner   Encounter Date: 06/05/2020   PT End of Session - 06/05/20 1442    Visit Number 4    Date for PT Re-Evaluation 07/19/20    PT Start Time 1400    PT Stop Time 1440    PT Time Calculation (min) 40 min    Activity Tolerance Patient tolerated treatment well    Behavior During Therapy [facility] for tasks assessed/performed           Past Medical History:  Diagnosis Date  . Allergy   . Anxiety   . Arthritis   . Complication of anesthesia    "difficulty waking up. I could hear them but I couldn't wake up"  . COPD (chronic obstructive pulmonary disease) ([place])   . Depression   . Emphysema   . Esophagitis   . Herpes simplex   . Hypertension   . Hypothyroidism   . Migraines   . Osteopenia   . Vitamin D deficiency     Past Surgical History:  Procedure Laterality Date  . BACK SURGERY  02/14/2018   lumbar 3-4  fusion  . BREAST EXCISIONAL BIOPSY Left 1979   Benign   . CATARACT EXTRACTION, BILATERAL    . CERVICAL FUSION    . CHOLECYSTECTOMY    . KYPHOPLASTY  09/19/2015   Dr. Ellene Route  T12  . LUMBAR DISC SURGERY  11/11/2017   L3-4  . Thumb surg    . THYROIDECTOMY    . TONSILLECTOMY    . VAGINAL HYSTERECTOMY      There were no vitals filed for this visit.   Subjective Assessment - 06/05/20 1408    Subjective Pt reports no new changes; feeling tired today    Currently in Pain? Yes    Pain Score 3     Pain Location Back    Pain Orientation Lower;Right                             [facility] Adult PT Treatment/Exercise - 06/05/20 0001      Lumbar Exercises: Standing   Heel Raises 20 reps    Other Standing Lumbar Exercises standing hip abd/ext x10 B      Lumbar Exercises:  Seated   Long Arc Quad on Chair Both;2 sets;10 reps    Other Seated Lumbar Exercises seated marches 2x10 B      Knee/Hip Exercises: Seated   Abduction/Adduction  Strengthening;Both;1 set;15 reps    Abd/Adduction Limitations red TB    Sit to Sand 2 sets;10 reps;without UE support   from standard chair holding ball to avoid reliance on UEs                   PT Short Term Goals - 06/03/20 1419      PT SHORT TERM GOAL #1   Title independent with initial HEP    Time 3    Period Weeks    Status Achieved             PT Long Term Goals - 05/19/20 1542      PT LONG TERM GOAL #1   Title understand posture, body mechanics and restrictions with her surgery    Time 12  Period Weeks    Status New      PT LONG TERM GOAL #2   Title decresae pain 25%    Time 12    Period Weeks    Status New      PT LONG TERM GOAL #3   Title increase lumbar ROM 25%    Time 12    Period Weeks    Status New      PT LONG TERM GOAL #4   Title decrease TUG to 25 seconds    Time 12    Period Weeks    Status New      PT LONG TERM GOAL #5   Title decrese 5x STS to 25 seconds    Time 12    Period Weeks    Status New                 Plan - 06/05/20 1442    Clinical Impression Statement Pt able to tolerate more standing ex's this rx. Does require frequent seated rest breaks d/t fatigue. Able to stand from standard chair this rx without UE support. Did require cuing for sequencing with STS. Close supervision with gait around clinic d/t occasional LOB; tends to get distracted if too many people present. Continue to progress strength, balance training, and core stab while observing back precautions.    PT Treatment/Interventions ADLs/Self Care Home Management;Electrical Stimulation;Moist Heat;Cryotherapy;Gait training;Stair training;Functional mobility training;Therapeutic activities;Therapeutic exercise;Balance training;Neuromuscular re-education;Patient/family education;Manual  techniques    PT Next Visit Plan slowly progress strength, function and balance    Consulted and Agree with Plan of Care Patient           Patient will benefit from skilled therapeutic intervention in order to improve the following deficits and impairments:  Abnormal gait,Decreased range of motion,Difficulty walking,Cardiopulmonary status limiting activity,Increased muscle spasms,Pain,Decreased activity tolerance,Impaired flexibility,Decreased balance,Improper body mechanics,Postural dysfunction,Decreased strength,Decreased mobility  Visit Diagnosis: Acute bilateral low back pain with bilateral sciatica  Muscle weakness (generalized)  Other lack of coordination  Difficulty in walking, not elsewhere classified     Problem List Patient Active Problem List   Diagnosis Date Noted  . Essential tremor 05/21/2020  . GERD (gastroesophageal reflux disease) 05/21/2020  . Lumbar stenosis with neurogenic claudication 02/14/2018  . Mild cognitive impairment 01/29/2015  . Degenerative joint disease of cervical spine 01/29/2015  . Gait difficulty 01/06/2015  . Urinary urgency 01/06/2015  . COPD (chronic obstructive pulmonary disease) ([place]) 05/03/2011  . Osteopenia   . Migraines   . Hypertension 01/15/2011  . Insomnia 01/15/2011  . Hypothyroidism 09/17/2010  . Anxiety 09/17/2010  . Hyperlipidemia 09/17/2010  . History of smoking 09/17/2010   Amador Cunas, PT, DPT Donald Prose Khamani Daniely 06/05/2020, 2:47 PM  [place]. [place], [place], [address] Phone: [phone]   Fax:  [phone]  Name: Megan Williams MRN: [id number] Date of Birth: [birth year]

## 2020-06-05 NOTE — Therapy (Signed)
[place]. [place], [place], [address] Phone: [phone]   Fax:  [phone]  Occupational Therapy Treatment  Patient Details  Name: Megan Williams MRN: [id number] Date of Birth: 07-Jun-1945 Referring Provider (OT): Kristeen Miss, MD   Encounter Date: 06/05/2020   OT End of Session - 06/05/20 1325    Visit Number 5    Number of Visits 21    Date for OT Re-Evaluation 07/18/20    Authorization Type [employer]    Progress Note Due on Visit 10    OT Start Time 1315    OT Stop Time 1358    OT Time Calculation (min) 43 min    Activity Tolerance Patient tolerated treatment well    Behavior During Therapy [facility] for tasks assessed/performed   Pt demeanor different from baseline, particularly toward conclusion of session          Past Medical History:  Diagnosis Date  . Allergy   . Anxiety   . Arthritis   . Complication of anesthesia    "difficulty waking up. I could hear them but I couldn't wake up"  . COPD (chronic obstructive pulmonary disease) ([place])   . Depression   . Emphysema   . Esophagitis   . Herpes simplex   . Hypertension   . Hypothyroidism   . Migraines   . Osteopenia   . Vitamin D deficiency     Past Surgical History:  Procedure Laterality Date  . BACK SURGERY  02/14/2018   lumbar 3-4  fusion  . BREAST EXCISIONAL BIOPSY Left 1979   Benign   . CATARACT EXTRACTION, BILATERAL    . CERVICAL FUSION    . CHOLECYSTECTOMY    . KYPHOPLASTY  09/19/2015   Dr. Ellene Route  T12  . LUMBAR DISC SURGERY  11/11/2017   L3-4  . Thumb surg    . THYROIDECTOMY    . TONSILLECTOMY    . VAGINAL HYSTERECTOMY      There were no vitals filed for this visit.   Subjective Assessment - 06/05/20 1319    Subjective  "That just does not work"    Pertinent History Mild neurocognitive disorder, essential tremor, osteopenia, arthritis, emphysema, COPD, depression, anxiety    Patient Stated Goals "I want to get  back to being me"    Currently in Pain? No/denies            OT Treatments/Exercises (OP) - 06/05/20 1803      ADLs   LB Dressing Pt demo'd donning/doffing pants w/ SPV and Mod I w/ doffing/donning shoes (including tying shoelaces); OT discussed safety strategies to prevent falls at home during LB dressing    Cooking OT discussed potential benefit of rearranging areas of her kitchen for increased accessibility, or having her husband retrieve task objects needed for cooking prn due to pt's current inability to bend low/reach overhead for items    Increased Safety Strategies OT reviewed energy conservation and reiterated education/importance of implementing fall prevention strategies during activities at home    Compensatory Strategies OT reviewed all previously discussed/trialed compensatory strategies for LB dressing, sleep positioning, bathing, and unloading/loading top rack of dishwasher            OT Short Term Goals - 06/05/20 1346      OT SHORT TERM GOAL #1   Title Pt will independently identify at least 2 compensatory or energy conservation strategies, including use of AE, to implement during BADLs    Baseline  No compensatory strategies at this time    Time 5    Period Weeks    Status On-going    Target Date 06/24/20      OT SHORT TERM GOAL #2   Title Pt will thread BLEs into pants/bottoms w/ Min A, using AE prn, at least 75% of the time    Baseline Unable to thread BLEs    Time 5    Period Weeks    Status Achieved   06/05/20 - threading BLEs consistently w/ Mod I using cross-legged method     OT SHORT TERM GOAL #3   Title Pt will pull bottoms up once legs are threaded w/ Mod I    Baseline Min A to pull pants up once threaded    Time 5    Period Weeks    Status Achieved   06/05/20 - demonstrates pulling up bottoms w/ Mod I and reports consistenty at home           OT Long Term Goals - 06/05/20 1345      OT LONG TERM GOAL #1   Title Pt will demonstrate understanding  of appropriate body mechanics and adaptive strategies to assist w/ pain management and safety during BADLs    Baseline Decreased knowledge of adaptive strategies and body mechanics    Time 10    Period Weeks    Status On-going      OT LONG TERM GOAL #2   Title Pt will safely participate in complete LB dressing w/ SPV    Baseline Max A w/ LB dressing    Time 10    Period Weeks    Status Achieved   06/05/20 - demo'd LB dressing at SPV; reports Mod I at home     OT LONG TERM GOAL #3   Title Pt will safely transfer in <> out of shower w/ CGA    Baseline Min A w/ tub/shower transfer    Time 10    Period Weeks    Status Achieved   06/05/20 - reports Mod I at home           Plan - 06/05/20 1741    Clinical Impression Statement OT reviewed compensatory strategies previously discussed and trialed in previous sessions, which pt reports continuing to not carry over to home. OT reiterated importance of compensatory strategies at this time due to precautions and for safety/fall prevention; OT also inquired if pt believes she will attempt these strategies at home, to which pt replied that she does not believe strategies will work for her. At this time, pt has met 2/3 STGs and 2/3 LTGs and reports she cannot think of other functional activities she is having difficulty with at home. Due to this, OT discussed potential d/c with pt; pt was hesitant due to a reported fear of "doing something stupid and falling." OT will check in with pt next session and will d/c at that time if skilled occupational therapy services are no longer warranted.    OT Occupational Profile and History Detailed Assessment- Review of Records and additional review of physical, cognitive, psychosocial history related to current functional performance    Occupational performance deficits (Please refer to evaluation for details): ADL's;IADL's;Leisure;Social Participation    Body Structure / Function / Physical Skills ADL;Decreased  knowledge of precautions;Balance;UE functional use;Decreased knowledge of use of DME;FMC;Dexterity;Body mechanics;GMC;Strength;Pain;Endurance;Coordination;IADL    Cognitive Skills Attention;Memory;Problem Solve;Safety Awareness    Rehab Potential Good    Clinical Decision Making Multiple treatment options, significant  modification of task necessary    Comorbidities Affecting Occupational Performance: Presence of comorbidities impacting occupational performance    Modification or Assistance to Complete Evaluation  Min-Moderate modification of tasks or assist with assess necessary to complete eval    OT Frequency 2x / week    OT Duration Other (comment)   10 weeks   OT Treatment/Interventions Self-care/ADL training;Therapeutic exercise;Coping strategies training;Aquatic Therapy;Moist Heat;Neuromuscular education;Patient/family education;Energy conservation;Therapeutic activities;Cryotherapy;DME and/or AE instruction;Manual Therapy;Passive range of motion;Cognitive remediation/compensation    Plan Review compensatory/fall prevention strategies    Consulted and Agree with Plan of Care Patient           Patient will benefit from skilled therapeutic intervention in order to improve the following deficits and impairments:   Body Structure / Function / Physical Skills: ADL,Decreased knowledge of precautions,Balance,UE functional use,Decreased knowledge of use of DME,FMC,Dexterity,Body mechanics,GMC,Strength,Pain,Endurance,Coordination,IADL Cognitive Skills: Attention,Memory,Problem Solve,Safety Awareness     Visit Diagnosis: Acute bilateral low back pain with bilateral sciatica  Muscle weakness (generalized)  Other lack of coordination    Problem List Patient Active Problem List   Diagnosis Date Noted  . Essential tremor 05/21/2020  . GERD (gastroesophageal reflux disease) 05/21/2020  . Lumbar stenosis with neurogenic claudication 02/14/2018  . Mild cognitive impairment 01/29/2015  .  Degenerative joint disease of cervical spine 01/29/2015  . Gait difficulty 01/06/2015  . Urinary urgency 01/06/2015  . COPD (chronic obstructive pulmonary disease) ([place]) 05/03/2011  . Osteopenia   . Migraines   . Hypertension 01/15/2011  . Insomnia 01/15/2011  . Hypothyroidism 09/17/2010  . Anxiety 09/17/2010  . Hyperlipidemia 09/17/2010  . History of smoking 09/17/2010     Kathrine Cords, OTR/L, MSOT 06/05/2020, 6:18 PM  [place]. [place], [place], [address] Phone: [phone]   Fax:  [phone]  Name: Megan Williams MRN: [id number] Date of Birth: 02/07/1946

## 2020-06-10 ENCOUNTER — Other Ambulatory Visit: Payer: Self-pay

## 2020-06-10 ENCOUNTER — Ambulatory Visit: Payer: PPO | Admitting: Occupational Therapy

## 2020-06-10 ENCOUNTER — Encounter: Payer: Self-pay | Admitting: Occupational Therapy

## 2020-06-10 ENCOUNTER — Ambulatory Visit: Payer: PPO | Admitting: Physical Therapy

## 2020-06-10 ENCOUNTER — Encounter: Payer: Self-pay | Admitting: Physical Therapy

## 2020-06-10 DIAGNOSIS — M5441 Lumbago with sciatica, right side: Secondary | ICD-10-CM

## 2020-06-10 DIAGNOSIS — M5442 Lumbago with sciatica, left side: Secondary | ICD-10-CM | POA: Diagnosis not present

## 2020-06-10 DIAGNOSIS — R278 Other lack of coordination: Secondary | ICD-10-CM

## 2020-06-10 DIAGNOSIS — M6281 Muscle weakness (generalized): Secondary | ICD-10-CM

## 2020-06-10 DIAGNOSIS — R262 Difficulty in walking, not elsewhere classified: Secondary | ICD-10-CM

## 2020-06-10 NOTE — Therapy (Signed)
[place]. [place], [place], [address] Phone: [phone]   Fax:  [phone]  Physical Therapy Treatment  Patient Details  Name: Megan Williams MRN: [id number] Date of Birth: August 06, 1945 Referring Provider (PT): Elsner   Encounter Date: 06/10/2020   PT End of Session - 06/10/20 1451    Visit Number 5    Date for PT Re-Evaluation 07/19/20    PT Start Time 1400    PT Stop Time 1443    PT Time Calculation (min) 43 min    Activity Tolerance Patient tolerated treatment well    Behavior During Therapy [facility] for tasks assessed/performed           Past Medical History:  Diagnosis Date  . Allergy   . Anxiety   . Arthritis   . Complication of anesthesia    "difficulty waking up. I could hear them but I couldn't wake up"  . COPD (chronic obstructive pulmonary disease) ([place])   . Depression   . Emphysema   . Esophagitis   . Herpes simplex   . Hypertension   . Hypothyroidism   . Migraines   . Osteopenia   . Vitamin D deficiency     Past Surgical History:  Procedure Laterality Date  . BACK SURGERY  02/14/2018   lumbar 3-4  fusion  . BREAST EXCISIONAL BIOPSY Left 1979   Benign   . CATARACT EXTRACTION, BILATERAL    . CERVICAL FUSION    . CHOLECYSTECTOMY    . KYPHOPLASTY  09/19/2015   Dr. Ellene Route  T12  . LUMBAR DISC SURGERY  11/11/2017   L3-4  . Thumb surg    . THYROIDECTOMY    . TONSILLECTOMY    . VAGINAL HYSTERECTOMY      There were no vitals filed for this visit.   Subjective Assessment - 06/10/20 1400    Subjective Pt reports she fell yesterday; was using reacher to pick up tennis shoe and thinks she may have tripped over the steps in front of her with her toe. Denies hitting head, states no new N/T, reports LB is sore today.    Currently in Pain? Yes    Pain Score 2     Pain Location Back    Pain Orientation Lower              [facility] PT Assessment - 06/10/20 0001      Assessment   Next MD  Visit 06/20/20                         [facility] Adult PT Treatment/Exercise - 06/10/20 0001      Lumbar Exercises: Aerobic   Nustep L3 x 6 min LE/UE      Lumbar Exercises: Seated   Long Arc Quad on Chair Both;2 sets;10 reps    Other Seated Lumbar Exercises Ab bracing 10 x 5 sec in sitting    Other Seated Lumbar Exercises seated marches 2x10 B      Lumbar Exercises: Supine   Pelvic Tilt 10 reps;5 seconds    Clam 10 reps;3 seconds    Clam Limitations red TB    Bridge 10 reps;3 seconds    Bridge Limitations partial ROM    Straight Leg Raise 10 reps    Straight Leg Raises Limitations 2x10 B small range    Other Supine Lumbar Exercises supine marching with red TB x10 B  PT Short Term Goals - 06/03/20 1419      PT SHORT TERM GOAL #1   Title independent with initial HEP    Time 3    Period Weeks    Status Achieved             PT Long Term Goals - 05/19/20 1542      PT LONG TERM GOAL #1   Title understand posture, body mechanics and restrictions with her surgery    Time 12    Period Weeks    Status New      PT LONG TERM GOAL #2   Title decresae pain 25%    Time 12    Period Weeks    Status New      PT LONG TERM GOAL #3   Title increase lumbar ROM 25%    Time 12    Period Weeks    Status New      PT LONG TERM GOAL #4   Title decrease TUG to 25 seconds    Time 12    Period Weeks    Status New      PT LONG TERM GOAL #5   Title decrese 5x STS to 25 seconds    Time 12    Period Weeks    Status New                 Plan - 06/10/20 1452    Clinical Impression Statement Pt reports to clinic folowing a fall yesterday where she believes she tripped up the stairs. Denies hitting head, no new pain, a little sore in LB. Educated on when to call MD if new symptoms arise and/or if soreness does not resolve with pt VU and agreement. Supine ex's without back brace this rx. Tolerated well with some cuing for form. Continue to  progress strength, balance training, and core stab while observing back precautions.    PT Treatment/Interventions ADLs/Self Care Home Management;Electrical Stimulation;Moist Heat;Cryotherapy;Gait training;Stair training;Functional mobility training;Therapeutic activities;Therapeutic exercise;Balance training;Neuromuscular re-education;Patient/family education;Manual techniques    PT Next Visit Plan slowly progress strength, function and balance    Consulted and Agree with Plan of Care Patient           Patient will benefit from skilled therapeutic intervention in order to improve the following deficits and impairments:  Abnormal gait,Decreased range of motion,Difficulty walking,Cardiopulmonary status limiting activity,Increased muscle spasms,Pain,Decreased activity tolerance,Impaired flexibility,Decreased balance,Improper body mechanics,Postural dysfunction,Decreased strength,Decreased mobility  Visit Diagnosis: Acute bilateral low back pain with bilateral sciatica  Muscle weakness (generalized)  Other lack of coordination  Difficulty in walking, not elsewhere classified     Problem List Patient Active Problem List   Diagnosis Date Noted  . Essential tremor 05/21/2020  . GERD (gastroesophageal reflux disease) 05/21/2020  . Lumbar stenosis with neurogenic claudication 02/14/2018  . Mild cognitive impairment 01/29/2015  . Degenerative joint disease of cervical spine 01/29/2015  . Gait difficulty 01/06/2015  . Urinary urgency 01/06/2015  . COPD (chronic obstructive pulmonary disease) ([place]) 05/03/2011  . Osteopenia   . Migraines   . Hypertension 01/15/2011  . Insomnia 01/15/2011  . Hypothyroidism 09/17/2010  . Anxiety 09/17/2010  . Hyperlipidemia 09/17/2010  . History of smoking 09/17/2010   Amador Cunas, PT, DPT Donald Prose Calene Paradiso 06/10/2020, 2:55 PM  [place]. [place], [place], [address] Phone: [phone]    Fax:  [phone]  Name: Megan Williams MRN: [id number] Date of Birth: [birth year]

## 2020-06-10 NOTE — Patient Instructions (Signed)
Memory Compensation Strategies  1. Use "WARM" strategy. W= write it down A=  associate it R=  repeat it M=  make a mental picture  2. You can keep a Memory Notebook. Use a 3-ring notebook with sections for the following:  calendar, important names and phone numbers, medications, doctors' names/phone numbers, "to do list"/reminders, and a section to journal what you did each day  3. Use a calendar to write appointments down.  4. Write yourself a schedule for the day.  This can be placed on the calendar or in a separate section of the Memory Notebook.  Keeping a regular schedule can help memory.  5. Use medication organizer with sections for each day or morning/evening pills  You may need help loading it  6. Keep a basket, or pegboard by the door.   Place items that you need to take out with you in the basket or on the pegboard.  You may also want to include a message board for reminders.  7. Use sticky notes. Place sticky notes with reminders in a place where the task is performed.  For example:  "turn off the stove" placed by the stove, "lock the door" placed on the door at eye level, "take your medications" on the bathroom mirror or by the place where you normally take your medications  8. Use alarms/timers.  Use while cooking to remind yourself to check on food or as a reminder to take your medicine, or as a reminder to make a call, or as a reminder to perform another task, etc.  9. Use a voice recorder app or small tape recorder to record important information and notes for yourself. Go back at the end of the day and listen to these.  

## 2020-06-11 NOTE — Therapy (Signed)
[place]. [place], [place], [address] Phone: [phone]   Fax:  [phone]  Occupational Therapy Treatment & Discharge Summary  Patient Details  Name: Megan Williams MRN: [id number] Date of Birth: [birth year] Referring Provider (OT): Kristeen Miss, MD   Encounter Date: 06/10/2020   OT End of Session - 06/10/20 1329    Visit Number 6    Number of Visits 21    Date for OT Re-Evaluation 07/18/20    Authorization Type [employer]    Progress Note Due on Visit 10    OT Start Time 1315    OT Stop Time 1400    OT Time Calculation (min) 45 min    Activity Tolerance Patient tolerated treatment well    Behavior During Therapy [facility] for tasks assessed/performed   Pt demeanor different from baseline, particularly toward conclusion of session          [place]  Visits from Start of Care: 6  Current functional level related to goals / functional outcomes: Pt currently completing BADLs at Mod I level and requires some assist w/ IADLs due to precautions and/or premorbid cognitive deficits; see 'Goals' below for detail.   Remaining deficits: Medical precautions post-surgery, pre-morbid cognitive, [place], and coordination deficits   Education / Equipment: Reacher (self-purchase); energy conservation, fall prevention, compensatory strategies  Plan: Patient agrees to discharge.  Patient goals were partially met. Patient is being discharged due to being pleased with the current functional level.  ?????        Past Medical History:  Diagnosis Date  . Allergy   . Anxiety   . Arthritis   . Complication of anesthesia    "difficulty waking up. I could hear them but I couldn't wake up"  . COPD (chronic obstructive pulmonary disease) ([place])   . Depression   . Emphysema   . Esophagitis   . Herpes simplex   . Hypertension   . Hypothyroidism   . Migraines   . Osteopenia   . Vitamin D  deficiency     Past Surgical History:  Procedure Laterality Date  . BACK SURGERY  02/14/2018   lumbar 3-4  fusion  . BREAST EXCISIONAL BIOPSY Left 1979   Benign   . CATARACT EXTRACTION, BILATERAL    . CERVICAL FUSION    . CHOLECYSTECTOMY    . KYPHOPLASTY  09/19/2015   Dr. Ellene Route  T12  . LUMBAR DISC SURGERY  11/11/2017   L3-4  . Thumb surg    . THYROIDECTOMY    . TONSILLECTOMY    . VAGINAL HYSTERECTOMY      There were no vitals filed for this visit.   Subjective Assessment - 06/10/20 1318    Subjective  Pt reports she had a big fall yesterday and is "not quite sure how it happened," but somehow she lost her balance and fell near the steps that her dogs use to get up into the primary bed    Pertinent History Mild neurocognitive disorder, essential tremor, osteopenia, arthritis, emphysema, COPD, depression, anxiety    Patient Stated Goals "I want to get back to being me"    Currently in Pain? Yes    Pain Score 1     Pain Location Back    Pain Orientation Lower;Right    Pain Descriptors / Indicators Aching             OT Treatments/Exercises (OP) - 06/10/20 1315      ADLs  Increased Safety Strategies OT discussed fall prevention in depth due to pt's recurrence of falls; 'falls at home' checklist administered to pt with OT discussing pt-specific examples of modifications and considerations; OT also reviewed previously discussed energy conservation strategies to improve safety with functional mobility and decrease risk of falls    Compensatory Strategies Introduced and discussed memory compensation techniques including 'WARM' strategy, using sticky notes/notepads, using timers, and using organizers when applicable    General Comments All goals were reviewed; OT discussed pt's progress and discharge plan w/ pt             OT Short Term Goals - 06/10/20 1400      OT SHORT TERM GOAL #1   Title Pt will independently identify at least 2 compensatory or energy conservation  strategies, including use of AE, to implement during BADLs    Baseline No compensatory strategies at this time    Time 5    Period Weeks    Status Achieved   06/10/20 - reports using reacher to prevent bending and sitting when needed for rest breaks during higher level activities   Target Date 06/24/20      OT SHORT TERM GOAL #2   Title Pt will thread BLEs into pants/bottoms w/ Min A, using AE prn, at least 75% of the time    Baseline Unable to thread BLEs    Time 5    Period Weeks    Status Achieved   06/05/20 - threading BLEs consistently w/ Mod I using cross-legged method     OT SHORT TERM GOAL #3   Title Pt will pull bottoms up once legs are threaded w/ Mod I    Baseline Min A to pull pants up once threaded    Time 5    Period Weeks    Status Achieved   06/05/20 - demonstrates pulling up bottoms w/ Mod I; reports consistency at home            OT Long Term Goals - 06/10/20 1400      OT LONG TERM GOAL #1   Title Pt will demonstrate understanding of appropriate body mechanics and adaptive strategies to assist w/ pain management and safety during BADLs    Baseline Decreased knowledge of adaptive strategies and body mechanics    Time 10    Period Weeks    Status Not Met   06/10/20 - memory/cognitive deficits impacting successful understanding and carryover of adaptive strategies to prevent pain and promote safety during BADLs     OT LONG TERM GOAL #2   Title Pt will safely participate in complete LB dressing w/ SPV    Baseline Max A w/ LB dressing    Time 10    Period Weeks    Status Achieved   06/05/20 - demo'd LB dressing at SPV; reports Mod I at home     OT LONG TERM GOAL #3   Title Pt will safely transfer in <> out of shower w/ CGA    Baseline Min A w/ tub/shower transfer    Time 10    Period Weeks    Status Achieved   06/05/20 - reports Mod I at home            Plan - 06/10/20 1400    Clinical Impression Statement Pt is a 75 y.o. female seen in OP OT s/p PLIF at L2-3  level w/ revision of prior L2-L5 fixation on 05/01/20. Tx has included introduction of compensatory and adaptive strategies  for ADLs/IADLs, AE/devices, energy conservation strategies, and fall prevention. Pt has shown improvement with participation in functional activities and overall understanding of learned strategies, but with decreased carryover of these strategies to home due to primarily due to memory impairments. Pt reports there are no additional functional activities she is having difficulty with at home and has meet all STGs and 2/3 LTGs. Pt is appropriate for d/c from skilled occupational therapy services at this time; pt is agreeable to discharge plan and was encouraged to call if any further functional deficits or concerns develop.    OT Occupational Profile and History Detailed Assessment- Review of Records and additional review of physical, cognitive, psychosocial history related to current functional performance    Occupational performance deficits (Please refer to evaluation for details): ADL's;IADL's;Leisure;Social Participation    Body Structure / Function / Physical Skills ADL;Decreased knowledge of precautions;Balance;UE functional use;Decreased knowledge of use of DME;FMC;Dexterity;Body mechanics;GMC;Strength;Pain;Endurance;Coordination;IADL    Cognitive Skills Attention;Memory;Problem Solve;Safety Awareness    Rehab Potential Good    Clinical Decision Making Multiple treatment options, significant modification of task necessary    Comorbidities Affecting Occupational Performance: Presence of comorbidities impacting occupational performance    Modification or Assistance to Complete Evaluation  Min-Moderate modification of tasks or assist with assess necessary to complete eval    OT Frequency 2x / week    OT Duration Other (comment)   10 weeks   OT Treatment/Interventions Self-care/ADL training;Therapeutic exercise;Coping strategies training;Aquatic Therapy;Moist Heat;Neuromuscular  education;Patient/family education;Energy conservation;Therapeutic activities;Cryotherapy;DME and/or AE instruction;Manual Therapy;Passive range of motion;Cognitive remediation/compensation    Plan D/C    Consulted and Agree with Plan of Care Patient           Patient will benefit from skilled therapeutic intervention in order to improve the following deficits and impairments:   Body Structure / Function / Physical Skills: ADL,Decreased knowledge of precautions,Balance,UE functional use,Decreased knowledge of use of DME,FMC,Dexterity,Body mechanics,GMC,Strength,Pain,Endurance,Coordination,IADL Cognitive Skills: Attention,Memory,Problem Solve,Safety Awareness     Visit Diagnosis: Acute bilateral low back pain with bilateral sciatica  Muscle weakness (generalized)  Other lack of coordination    Problem List Patient Active Problem List   Diagnosis Date Noted  . Essential tremor 05/21/2020  . GERD (gastroesophageal reflux disease) 05/21/2020  . Lumbar stenosis with neurogenic claudication 02/14/2018  . Mild cognitive impairment 01/29/2015  . Degenerative joint disease of cervical spine 01/29/2015  . Gait difficulty 01/06/2015  . Urinary urgency 01/06/2015  . COPD (chronic obstructive pulmonary disease) ([place]) 05/03/2011  . Osteopenia   . Migraines   . Hypertension 01/15/2011  . Insomnia 01/15/2011  . Hypothyroidism 09/17/2010  . Anxiety 09/17/2010  . Hyperlipidemia 09/17/2010  . History of smoking 09/17/2010     Kathrine Cords, OTR/L, MSOT 06/10/2020, 2:00 PM  [place]. [place], [place], [address] Phone: [phone]   Fax:  [phone]  Name: LEONORA GORES MRN: [id number] Date of Birth: Apr 06, 1945

## 2020-06-12 ENCOUNTER — Ambulatory Visit: Payer: PPO | Admitting: Occupational Therapy

## 2020-06-12 ENCOUNTER — Other Ambulatory Visit: Payer: Self-pay

## 2020-06-12 ENCOUNTER — Ambulatory Visit: Payer: PPO | Admitting: Physical Therapy

## 2020-06-12 DIAGNOSIS — R278 Other lack of coordination: Secondary | ICD-10-CM

## 2020-06-12 DIAGNOSIS — M5442 Lumbago with sciatica, left side: Secondary | ICD-10-CM | POA: Diagnosis not present

## 2020-06-12 DIAGNOSIS — R262 Difficulty in walking, not elsewhere classified: Secondary | ICD-10-CM

## 2020-06-12 DIAGNOSIS — M6281 Muscle weakness (generalized): Secondary | ICD-10-CM

## 2020-06-12 NOTE — Therapy (Signed)
[address]. [place], [place], [address] Phone: [phone]   Fax:  [phone]  Physical Therapy Treatment  Patient Details  Name: Megan Williams MRN: [id number] Date of Birth: [birth year] Referring Provider (PT): Elsner   Encounter Date: 06/12/2020   PT End of Session - 06/12/20 1404    Visit Number 6    Date for PT Re-Evaluation 07/19/20    PT Start Time 1400    PT Stop Time 1445    PT Time Calculation (min) 45 min    Equipment Utilized During Treatment Back brace    Activity Tolerance Patient tolerated treatment well    Behavior During Therapy [facility] for tasks assessed/performed           Past Medical History:  Diagnosis Date  . Allergy   . Anxiety   . Arthritis   . Complication of anesthesia    "difficulty waking up. I could hear them but I couldn't wake up"  . COPD (chronic obstructive pulmonary disease) ([place])   . Depression   . Emphysema   . Esophagitis   . Herpes simplex   . Hypertension   . Hypothyroidism   . Migraines   . Osteopenia   . Vitamin D deficiency     Past Surgical History:  Procedure Laterality Date  . BACK SURGERY  02/14/2018   lumbar 3-4  fusion  . BREAST EXCISIONAL BIOPSY Left 1979   Benign   . CATARACT EXTRACTION, BILATERAL    . CERVICAL FUSION    . CHOLECYSTECTOMY    . KYPHOPLASTY  09/19/2015   Dr. Ellene Route  T12  . LUMBAR DISC SURGERY  11/11/2017   L3-4  . Thumb surg    . THYROIDECTOMY    . TONSILLECTOMY    . VAGINAL HYSTERECTOMY      There were no vitals filed for this visit.   Subjective Assessment - 06/12/20 1400    Subjective Not having any pain today. No new symptoms since last session. Pt stated took her brace off for a hour yesterday and feels like she over did it.    Currently in Pain? No/denies              [facility] PT Assessment - 06/12/20 0001      Posture/Postural Control   Posture Comments in back brace      Standardized Balance Assessment    Standardized Balance Assessment Five Times Sit to Stand    Five times sit to stand comments  24 seconds, noted fatigue on #5, No UE      Timed Up and Go Test   TUG Normal TUG    Normal TUG (seconds) 20                         [facility] Adult PT Treatment/Exercise - 06/12/20 0001      Lumbar Exercises: Aerobic   Nustep L3 x 6 mins      Lumbar Exercises: Standing   Heel Raises 20 reps      Lumbar Exercises: Seated   Long Arc Quad on Chair Both;2 sets;10 reps    LAQ on Chair Weights (lbs) 2    Other Seated Lumbar Exercises Ab bracing x10 with 5 sec hold    Other Seated Lumbar Exercises 2# seated marches 2x10 alternating LE      Lumbar Exercises: Supine   Dead Bug 20 reps   alternating UE only   Bridge 10 reps;3  seconds    Bridge Limitations partial ROM    Straight Leg Raise 10 reps   limited ROM on R   Other Supine Lumbar Exercises supine march x20      Knee/Hip Exercises: Seated   Ball Squeeze x10 with with 5 second hold                    PT Short Term Goals - 06/03/20 1419      PT SHORT TERM GOAL #1   Title independent with initial HEP    Time 3    Period Weeks    Status Achieved             PT Long Term Goals - 06/12/20 1500      PT LONG TERM GOAL #1   Title understand posture, body mechanics and restrictions with her surgery    Status On-going      PT LONG TERM GOAL #2   Title decresae pain 25%    Status On-going      PT LONG TERM GOAL #3   Title increase lumbar ROM 25%    Status On-going      PT LONG TERM GOAL #4   Title decrease TUG to 25 seconds    Baseline 20 seconds without UE support    Status Achieved      PT LONG TERM GOAL #5   Title decrese 5x STS to 25 seconds    Baseline 24 seconds, noted fatigue on rep #5    Status Achieved                 Plan - 06/12/20 1456    Clinical Impression Statement Pt tolerated session well today, progressed well with weight added to LE TE. fatigue noted on last rep of timed  5xSTS. Cueing needed for QS before SLR on R. Limited ROM noted with both SLR and bridging. Donned brace for entirety of session this time as pt reported she felt like she over did having it off for over 45 mins yesterday and was unable to rest or sleep comfortbaly after.    PT Treatment/Interventions ADLs/Self Care Home Management;Electrical Stimulation;Moist Heat;Cryotherapy;Gait training;Stair training;Functional mobility training;Therapeutic activities;Therapeutic exercise;Balance training;Neuromuscular re-education;Patient/family education;Manual techniques    PT Next Visit Plan contineu to progress functional strength and balance as indicated. Try to progress TE without use of back brace.           Patient will benefit from skilled therapeutic intervention in order to improve the following deficits and impairments:  Abnormal gait,Decreased range of motion,Difficulty walking,Cardiopulmonary status limiting activity,Increased muscle spasms,Pain,Decreased activity tolerance,Impaired flexibility,Decreased balance,Improper body mechanics,Postural dysfunction,Decreased strength,Decreased mobility  Visit Diagnosis: Acute bilateral low back pain with bilateral sciatica  Muscle weakness (generalized)  Other lack of coordination  Difficulty in walking, not elsewhere classified     Problem List Patient Active Problem List   Diagnosis Date Noted  . Essential tremor 05/21/2020  . GERD (gastroesophageal reflux disease) 05/21/2020  . Lumbar stenosis with neurogenic claudication 02/14/2018  . Mild cognitive impairment 01/29/2015  . Degenerative joint disease of cervical spine 01/29/2015  . Gait difficulty 01/06/2015  . Urinary urgency 01/06/2015  . COPD (chronic obstructive pulmonary disease) ([place]) 05/03/2011  . Osteopenia   . Migraines   . Hypertension 01/15/2011  . Insomnia 01/15/2011  . Hypothyroidism 09/17/2010  . Anxiety 09/17/2010  . Hyperlipidemia 09/17/2010  . History of smoking  09/17/2010    Ernst Spell, SPTA 06/12/2020, 3:04 PM  [facility]. [place], [place], [address] Phone: [phone]   Fax:  [phone]  Name: Megan Williams MRN: [id number] Date of Birth: 12-10-1945

## 2020-06-16 ENCOUNTER — Other Ambulatory Visit: Payer: Self-pay

## 2020-06-16 ENCOUNTER — Ambulatory Visit: Payer: PPO | Admitting: Physical Therapy

## 2020-06-16 ENCOUNTER — Encounter: Payer: Self-pay | Admitting: Physical Therapy

## 2020-06-16 ENCOUNTER — Ambulatory Visit: Payer: PPO | Admitting: Occupational Therapy

## 2020-06-16 DIAGNOSIS — M5442 Lumbago with sciatica, left side: Secondary | ICD-10-CM | POA: Diagnosis not present

## 2020-06-16 DIAGNOSIS — M6281 Muscle weakness (generalized): Secondary | ICD-10-CM

## 2020-06-16 DIAGNOSIS — R262 Difficulty in walking, not elsewhere classified: Secondary | ICD-10-CM

## 2020-06-16 DIAGNOSIS — M5441 Lumbago with sciatica, right side: Secondary | ICD-10-CM

## 2020-06-16 DIAGNOSIS — R278 Other lack of coordination: Secondary | ICD-10-CM

## 2020-06-16 NOTE — Therapy (Signed)
[place], [place], [address] Phone: [phone]   Fax:  [phone]  Physical Therapy Treatment  Patient Details  Name: Megan Williams MRN: [id number] Date of Birth: [birth year] Referring Provider (PT): Elsner   Encounter Date: 06/16/2020   PT End of Session - 06/16/20 1504    Visit Number 7    Date for PT Re-Evaluation 07/19/20    Authorization Type Health team advantage    PT Start Time 1400    PT Stop Time 1445    PT Time Calculation (min) 45 min    Activity Tolerance Patient tolerated treatment well    Behavior During Therapy [facility] for tasks assessed/performed           Past Medical History:  Diagnosis Date  . Allergy   . Anxiety   . Arthritis   . Complication of anesthesia    "difficulty waking up. I could hear them but I couldn't wake up"  . COPD (chronic obstructive pulmonary disease) ([place])   . Depression   . Emphysema   . Esophagitis   . Herpes simplex   . Hypertension   . Hypothyroidism   . Migraines   . Osteopenia   . Vitamin D deficiency     Past Surgical History:  Procedure Laterality Date  . BACK SURGERY  02/14/2018   lumbar 3-4  fusion  . BREAST EXCISIONAL BIOPSY Left 1979   Benign   . CATARACT EXTRACTION, BILATERAL    . CERVICAL FUSION    . CHOLECYSTECTOMY    . KYPHOPLASTY  09/19/2015   Dr. Ellene Route  T12  . LUMBAR DISC SURGERY  11/11/2017   L3-4  . Thumb surg    . THYROIDECTOMY    . TONSILLECTOMY    . VAGINAL HYSTERECTOMY      There were no vitals filed for this visit.   Subjective Assessment - 06/16/20 1405    Subjective I don't really have much pain if I do things correctly.  Sitting is stilsome issue, I am able to stand much better, I can do about 15 minutes then I get tired, fatigue and achiness    Currently in Pain? No/denies                             [facility] Adult PT Treatment/Exercise - 06/16/20 0001      High Level Balance   High  Level Balance Activities Side stepping;Tandem walking    High Level Balance Comments on airex head turns, eyes closed, ball toss,      Lumbar Exercises: Aerobic   Tread Mill 1.2 mph x 5 minutes some fatigue in the left leg and hip and very tighted    Nustep L3 x 6 mins      Lumbar Exercises: Standing   Other Standing Lumbar Exercises red tband rows and extension with cues for posture and core 2x10 each      Knee/Hip Exercises: Standing   Hip Flexion Both;2 sets;10 reps    Hip Flexion Limitations 2#    Hip Abduction Both;2 sets;10 reps    Abduction Limitations 2#                    PT Short Term Goals - 06/03/20 1419      PT SHORT TERM GOAL #1   Title independent with initial HEP    Time 3    Period Weeks  Status Achieved             PT Long Term Goals - 06/12/20 1500      PT LONG TERM GOAL #1   Title understand posture, body mechanics and restrictions with her surgery    Status On-going      PT LONG TERM GOAL #2   Title decresae pain 25%    Status On-going      PT LONG TERM GOAL #3   Title increase lumbar ROM 25%    Status On-going      PT LONG TERM GOAL #4   Title decrease TUG to 25 seconds    Baseline 20 seconds without UE support    Status Achieved      PT LONG TERM GOAL #5   Title decrese 5x STS to 25 seconds    Baseline 24 seconds, noted fatigue on rep #5    Status Achieved                 Plan - 06/16/20 1505    Clinical Impression Statement I added treadmill and balance, the left hup and leg had some "fatigue and a little bit of a tightness" with the activities, she had ankle, hip and step strategies with the airex balance activities, she did report to me a near fall this morning that she was worried aobut her balance    PT Next Visit Plan balance and strength, making good progress    Consulted and Agree with Plan of Care Patient           Patient will benefit from skilled therapeutic intervention in order to improve the  following deficits and impairments:  Abnormal gait,Decreased range of motion,Difficulty walking,Cardiopulmonary status limiting activity,Increased muscle spasms,Pain,Decreased activity tolerance,Impaired flexibility,Decreased balance,Improper body mechanics,Postural dysfunction,Decreased strength,Decreased mobility  Visit Diagnosis: Acute bilateral low back pain with bilateral sciatica  Muscle weakness (generalized)  Other lack of coordination  Difficulty in walking, not elsewhere classified     Problem List Patient Active Problem List   Diagnosis Date Noted  . Essential tremor 05/21/2020  . GERD (gastroesophageal reflux disease) 05/21/2020  . Lumbar stenosis with neurogenic claudication 02/14/2018  . Mild cognitive impairment 01/29/2015  . Degenerative joint disease of cervical spine 01/29/2015  . Gait difficulty 01/06/2015  . Urinary urgency 01/06/2015  . COPD (chronic obstructive pulmonary disease) ([place]) 05/03/2011  . Osteopenia   . Migraines   . Hypertension 01/15/2011  . Insomnia 01/15/2011  . Hypothyroidism 09/17/2010  . Anxiety 09/17/2010  . Hyperlipidemia 09/17/2010  . History of smoking 09/17/2010    Sumner Boast., PT 06/16/2020, 3:13 PM  [place] [place], [place], [address] Phone: [phone]   Fax:  [phone]  Name: Megan Williams MRN: [id number] Date of Birth: 1945-08-25

## 2020-06-18 ENCOUNTER — Encounter: Payer: PPO | Admitting: Occupational Therapy

## 2020-06-18 ENCOUNTER — Other Ambulatory Visit: Payer: Self-pay

## 2020-06-18 ENCOUNTER — Ambulatory Visit: Payer: PPO | Admitting: Physical Therapy

## 2020-06-18 ENCOUNTER — Encounter: Payer: Self-pay | Admitting: Physical Therapy

## 2020-06-18 DIAGNOSIS — R262 Difficulty in walking, not elsewhere classified: Secondary | ICD-10-CM

## 2020-06-18 DIAGNOSIS — M5442 Lumbago with sciatica, left side: Secondary | ICD-10-CM

## 2020-06-18 DIAGNOSIS — R278 Other lack of coordination: Secondary | ICD-10-CM

## 2020-06-18 DIAGNOSIS — M5441 Lumbago with sciatica, right side: Secondary | ICD-10-CM

## 2020-06-18 DIAGNOSIS — M6281 Muscle weakness (generalized): Secondary | ICD-10-CM

## 2020-06-18 NOTE — Therapy (Signed)
[place] [place], [place], [address] Phone: [phone]   Fax:  [phone]  Physical Therapy Treatment  Patient Details  Name: Megan Williams MRN: [id number] Date of Birth: 1945/08/06 Referring Provider (PT): Elsner   Encounter Date: 06/18/2020   PT End of Session - 06/18/20 1708    Visit Number 8    Date for PT Re-Evaluation 07/19/20    Authorization Type Health team advantage    PT Start Time 1525    PT Stop Time 1610    PT Time Calculation (min) 45 min    Equipment Utilized During Treatment Back brace    Activity Tolerance Patient tolerated treatment well    Behavior During Therapy [facility] for tasks assessed/performed           Past Medical History:  Diagnosis Date  . Allergy   . Anxiety   . Arthritis   . Complication of anesthesia    "difficulty waking up. I could hear them but I couldn't wake up"  . COPD (chronic obstructive pulmonary disease) ([place])   . Depression   . Emphysema   . Esophagitis   . Herpes simplex   . Hypertension   . Hypothyroidism   . Migraines   . Osteopenia   . Vitamin D deficiency     Past Surgical History:  Procedure Laterality Date  . BACK SURGERY  02/14/2018   lumbar 3-4  fusion  . BREAST EXCISIONAL BIOPSY Left 1979   Benign   . CATARACT EXTRACTION, BILATERAL    . CERVICAL FUSION    . CHOLECYSTECTOMY    . KYPHOPLASTY  09/19/2015   Dr. Ellene Route  T12  . LUMBAR DISC SURGERY  11/11/2017   L3-4  . Thumb surg    . THYROIDECTOMY    . TONSILLECTOMY    . VAGINAL HYSTERECTOMY      There were no vitals filed for this visit.   Subjective Assessment - 06/18/20 1530    Subjective Patient reports that she has a little more low back pain today, reports that she either slept wrong or was without the back brace "too long"    Currently in Pain? Yes    Pain Score 2     Pain Location Back    Pain Orientation Lower    Pain Descriptors / Indicators Sore               [facility] PT Assessment - 06/18/20 0001      Standardized Balance Assessment   Standardized Balance Assessment Five Times Sit to Stand    Five times sit to stand comments  24 seconds, noted fatigue on #5, No UE      Timed Up and Go Test   TUG Normal TUG    Normal TUG (seconds) 18                         [facility] Adult PT Treatment/Exercise - 06/18/20 0001      Ambulation/Gait   Gait Comments walk outside in the grass 200 feet, then outside on the road 250 feet, mild increase of low back soreness      High Level Balance   High Level Balance Activities Side stepping;Tandem walking    High Level Balance Comments on airex head turns, eyes closed, ball toss,      Lumbar Exercises: Aerobic   Nustep L4 x 6 mins      Lumbar Exercises: Standing   Other  Standing Lumbar Exercises red tband rows and extension with cues for posture and core 2x10 each      Knee/Hip Exercises: Standing   Hip Flexion Both;2 sets;10 reps    Hip Flexion Limitations 2#    Hip Abduction Both;2 sets;10 reps    Abduction Limitations 2#    Hip Extension Both;2 sets;10 reps    Extension Limitations 2#                    PT Short Term Goals - 06/03/20 1419      PT SHORT TERM GOAL #1   Title independent with initial HEP    Time 3    Period Weeks    Status Achieved             PT Long Term Goals - 06/18/20 1711      PT LONG TERM GOAL #1   Title understand posture, body mechanics and restrictions with her surgery    Status Partially Met      PT LONG TERM GOAL #2   Title decresae pain 25%    Status Partially Met      PT LONG TERM GOAL #3   Title increase lumbar ROM 25%    Status On-going      PT LONG TERM GOAL #4   Title decrease TUG to 25 seconds    Status Achieved      PT LONG TERM GOAL #5   Title decrese 5x STS to 25 seconds    Status Achieved                 Plan - 06/18/20 1709    Clinical Impression Statement We have been slowly increasing activities for  strength and function, started to work more on balance as she reports feeling off balance at times.  She does have some issues with dynamic surfaces.  walking does increase some of the low back pain but mostly c/o tightness.  She has been going without the brace for short periods at home, she reports some soreness if she goes too long, she made great improvements in her functional walking seen by decreased TUG time and decreased sit to stand activities    PT Next Visit Plan continue to work on strength, balance and function    Consulted and Agree with Plan of Care Patient           Patient will benefit from skilled therapeutic intervention in order to improve the following deficits and impairments:  Abnormal gait,Decreased range of motion,Difficulty walking,Cardiopulmonary status limiting activity,Increased muscle spasms,Pain,Decreased activity tolerance,Impaired flexibility,Decreased balance,Improper body mechanics,Postural dysfunction,Decreased strength,Decreased mobility  Visit Diagnosis: Acute bilateral low back pain with bilateral sciatica  Muscle weakness (generalized)  Other lack of coordination  Difficulty in walking, not elsewhere classified     Problem List Patient Active Problem List   Diagnosis Date Noted  . Essential tremor 05/21/2020  . GERD (gastroesophageal reflux disease) 05/21/2020  . Lumbar stenosis with neurogenic claudication 02/14/2018  . Mild cognitive impairment 01/29/2015  . Degenerative joint disease of cervical spine 01/29/2015  . Gait difficulty 01/06/2015  . Urinary urgency 01/06/2015  . COPD (chronic obstructive pulmonary disease) ([place]) 05/03/2011  . Osteopenia   . Migraines   . Hypertension 01/15/2011  . Insomnia 01/15/2011  . Hypothyroidism 09/17/2010  . Anxiety 09/17/2010  . Hyperlipidemia 09/17/2010  . History of smoking 09/17/2010    Sumner Boast., PT 06/18/2020, 5:12 PM  [facility] [phone] [address] [place], [place], [address] Phone: [phone]   Fax:  [phone]  Name: Megan Williams MRN: [id number] Date of Birth: 08-16-1945

## 2020-06-19 ENCOUNTER — Ambulatory Visit: Payer: PPO | Admitting: Occupational Therapy

## 2020-06-19 ENCOUNTER — Ambulatory Visit: Payer: PPO | Admitting: Physical Therapy

## 2020-06-19 DIAGNOSIS — G4733 Obstructive sleep apnea (adult) (pediatric): Secondary | ICD-10-CM | POA: Diagnosis not present

## 2020-06-19 DIAGNOSIS — M8580 Other specified disorders of bone density and structure, unspecified site: Secondary | ICD-10-CM | POA: Diagnosis not present

## 2020-06-19 DIAGNOSIS — I129 Hypertensive chronic kidney disease with stage 1 through stage 4 chronic kidney disease, or unspecified chronic kidney disease: Secondary | ICD-10-CM | POA: Diagnosis not present

## 2020-06-19 DIAGNOSIS — E039 Hypothyroidism, unspecified: Secondary | ICD-10-CM | POA: Diagnosis not present

## 2020-06-19 DIAGNOSIS — N182 Chronic kidney disease, stage 2 (mild): Secondary | ICD-10-CM | POA: Diagnosis not present

## 2020-06-19 DIAGNOSIS — E785 Hyperlipidemia, unspecified: Secondary | ICD-10-CM | POA: Diagnosis not present

## 2020-06-20 DIAGNOSIS — M5416 Radiculopathy, lumbar region: Secondary | ICD-10-CM | POA: Diagnosis not present

## 2020-06-24 ENCOUNTER — Ambulatory Visit: Payer: PPO | Admitting: Rehabilitative and Restorative Service Providers"

## 2020-06-26 ENCOUNTER — Other Ambulatory Visit: Payer: Self-pay | Admitting: Internal Medicine

## 2020-06-26 ENCOUNTER — Ambulatory Visit: Payer: PPO | Admitting: Physical Therapy

## 2020-06-26 DIAGNOSIS — N182 Chronic kidney disease, stage 2 (mild): Secondary | ICD-10-CM

## 2020-07-01 ENCOUNTER — Ambulatory Visit: Payer: PPO

## 2020-07-02 ENCOUNTER — Ambulatory Visit
Admission: RE | Admit: 2020-07-02 | Discharge: 2020-07-02 | Disposition: A | Discharge status: Home / Self Care | Payer: PPO | Source: Ambulatory Visit | Attending: Internal Medicine | Admitting: Internal Medicine

## 2020-07-02 DIAGNOSIS — N189 Chronic kidney disease, unspecified: Secondary | ICD-10-CM | POA: Diagnosis not present

## 2020-07-02 DIAGNOSIS — N3289 Other specified disorders of bladder: Secondary | ICD-10-CM | POA: Diagnosis not present

## 2020-07-02 DIAGNOSIS — N182 Chronic kidney disease, stage 2 (mild): Secondary | ICD-10-CM

## 2020-07-02 DIAGNOSIS — N281 Cyst of kidney, acquired: Secondary | ICD-10-CM | POA: Diagnosis not present

## 2020-07-03 ENCOUNTER — Ambulatory Visit: Payer: PPO

## 2020-07-22 DIAGNOSIS — M810 Age-related osteoporosis without current pathological fracture: Secondary | ICD-10-CM | POA: Diagnosis not present

## 2020-07-29 ENCOUNTER — Ambulatory Visit (INDEPENDENT_AMBULATORY_CARE_PROVIDER_SITE_OTHER): Payer: PPO | Admitting: Nurse Practitioner

## 2020-07-29 ENCOUNTER — Encounter: Payer: Self-pay | Admitting: Nurse Practitioner

## 2020-07-29 ENCOUNTER — Other Ambulatory Visit: Payer: Self-pay

## 2020-07-29 VITALS — BP 122/82 | Ht 62.5 in | Wt 121.0 lb

## 2020-07-29 DIAGNOSIS — Z01419 Encounter for gynecological examination (general) (routine) without abnormal findings: Secondary | ICD-10-CM | POA: Diagnosis not present

## 2020-07-29 DIAGNOSIS — D229 Melanocytic nevi, unspecified: Secondary | ICD-10-CM

## 2020-07-29 DIAGNOSIS — Z9189 Other specified personal risk factors, not elsewhere classified: Secondary | ICD-10-CM

## 2020-07-29 DIAGNOSIS — Z79890 Hormone replacement therapy (postmenopausal): Secondary | ICD-10-CM

## 2020-07-29 DIAGNOSIS — M8580 Other specified disorders of bone density and structure, unspecified site: Secondary | ICD-10-CM

## 2020-07-29 DIAGNOSIS — Z780 Asymptomatic menopausal state: Secondary | ICD-10-CM

## 2020-07-29 MED ORDER — ESTRADIOL 1 MG PO TABS
1.0000 mg | ORAL_TABLET | Freq: Every day | ORAL | 3 refills | Status: DC
Start: 2020-07-29 — End: 2021-07-30

## 2020-07-29 NOTE — Progress Notes (Signed)
Megan Williams [birth year] [id number]   History:  75 y.o. G2P2002 presents for breast and pelvic exam without GYN complaints. Postmenopausal - on ERT. She has tried to wean but is unable to tolerate hot flashes. She still has occasional hot flashes on her current dose. 1979 TVH. Normal pap and mammogram history. Osteopenia managed by PCP, on prolia. Hypothyroidism and HLD also managed by PCP. Smokes 5 cigarettes per day.   Gynecologic History No LMP recorded. Patient has had a hysterectomy.   Contraception: status post hysterectomy  Health Maintenance Last Pap: No longer screening per guidelines Last mammogram: 08/16/2019. Results were: normal Last colonoscopy: 2018. Results were: normal, 10-year recall Last Dexa: 10/11/2018. Results were: T-score -1.7, no FRAX d/t Prolia  Past medical history, past surgical history, family history and social history were all reviewed and documented in the EPIC chart. Married.   ROS:  A ROS was performed and pertinent positives and negatives are included.  Exam:  Vitals:   07/29/20 1415  BP: 122/82  Weight: 121 lb (54.9 kg)  Height: 5' 2.5" (1.588 m)   Body mass index is 21.78 kg/m.  General appearance:  Normal Thyroid:  Symmetrical, normal in size, without palpable masses or nodularity. Respiratory  Auscultation:  Clear without wheezing or rhonchi Cardiovascular  Auscultation:  Regular rate, without rubs, murmurs or gallops  Edema/varicosities:  Not grossly evident Abdominal  Soft,nontender, without masses, guarding or rebound.  Liver/spleen:  No organomegaly noted  Hernia:  None appreciated  Skin  Inspection:  Grossly normal. Numerous moles Breasts: Examined lying and sitting.   Right: Without masses, retractions, nipple discharge or axillary adenopathy.   Left: Without masses, retractions, nipple discharge or axillary adenopathy. Genitourinary   Inguinal/mons:  Normal without inguinal adenopathy  External genitalia:  Normal  appearing vulva with no masses, tenderness, or lesions  BUS/Urethra/Skene's glands:  Normal  Vagina:  Normal appearing with normal color and discharge, no lesions. Atrophic changes  Cervix:  Absent  Uterus:  Absent  Adnexa/parametria:     Rt: Normal in size, without masses or tenderness.   Lt: Normal in size, without masses or tenderness.  Anus and perineum: Normal  Digital rectal exam: Normal sphincter tone without palpated masses or tenderness  Assessment/Plan:  75 y.o. [id number] for breast and pelvic exam.   Well female exam with routine gynecological exam - Education provided on SBEs, importance of preventative screenings, current guidelines, high calcium diet, regular exercise, and multivitamin daily. Labs with PCP.   Postmenopausal - on ERT. TVH 1979.  Hormone replacement therapy - Plan: estradiol (ESTRACE) 1 MG tablet daily. She has tried to wean but is unable to tolerate hot flashes. She still has occasional hot flashes on her current dose. We again discussed the risks of continued use at her age and she would like to continue. Refill x 1 year provided.   Osteopenia, unspecified location - Last Dexa 10/11/2018 T-score -1.7, no FRAX d/t Prolia. Managed by PCP. Continue daily Vitamin D supplement and regular exercise. DXA scheduled already in September.   Numerous skin moles - suspicious mole on mid back, and a couple on lower extremities. She has seen dermatology in the past and will follow up with them since it has been a while. She gets lots of sun exposure.   Screening for cervical cancer - Normal Pap history. No longer screen per guidleines.   Screening for breast cancer - Normal mammogram history.  Continue annual screenings.  Normal breast exam today.  Screening for colon cancer -  2018 colonoscopy. Will repeat at GI's recommended interval.   Return in 1 year for annual.    Tamela Gammon DNP, 2:33 PM 07/29/2020

## 2020-07-29 NOTE — Patient Instructions (Signed)
Health Maintenance After Age 73 After age 73, you are at a higher risk for certain long-term diseases and infections as well as injuries from falls. Falls are a major cause of broken bones and head injuries in people who are older than age 73. Getting regular preventive care can help to keep you healthy and well. Preventive care includes getting regular testing and making lifestyle changes as recommended by your health care provider. Talk with your health care provider about:  Which screenings and tests you should have. A screening is a test that checks for a disease when you have no symptoms.  A diet and exercise plan that is right for you. What should I know about screenings and tests to prevent falls? Screening and testing are the best ways to find a health problem early. Early diagnosis and treatment give you the best chance of managing medical conditions that are common after age 73. Certain conditions and lifestyle choices may make you more likely to have a fall. Your health care provider may recommend:  Regular vision checks. Poor vision and conditions such as cataracts can make you more likely to have a fall. If you wear glasses, make sure to get your prescription updated if your vision changes.  Medicine review. Work with your health care provider to regularly review all of the medicines you are taking, including over-the-counter medicines. Ask your health care provider about any side effects that may make you more likely to have a fall. Tell your health care provider if any medicines that you take make you feel dizzy or sleepy.  Osteoporosis screening. Osteoporosis is a condition that causes the bones to get weaker. This can make the bones weak and cause them to break more easily.  Blood pressure screening. Blood pressure changes and medicines to control blood pressure can make you feel dizzy.  Strength and balance checks. Your health care provider may recommend certain tests to check your  strength and balance while standing, walking, or changing positions.  Foot health exam. Foot pain and numbness, as well as not wearing proper footwear, can make you more likely to have a fall.  Depression screening. You may be more likely to have a fall if you have a fear of falling, feel emotionally low, or feel unable to do activities that you used to do.  Alcohol use screening. Using too much alcohol can affect your balance and may make you more likely to have a fall. What actions can I take to lower my risk of falls? General instructions  Talk with your health care provider about your risks for falling. Tell your health care provider if: ? You fall. Be sure to tell your health care provider about all falls, even ones that seem minor. ? You feel dizzy, sleepy, or off-balance.  Take over-the-counter and prescription medicines only as told by your health care provider. These include any supplements.  Eat a healthy diet and maintain a healthy weight. A healthy diet includes low-fat dairy products, low-fat (lean) meats, and fiber from whole grains, beans, and lots of fruits and vegetables. Home safety  Remove any tripping hazards, such as rugs, cords, and clutter.  Install safety equipment such as grab bars in bathrooms and safety rails on stairs.  Keep rooms and walkways well-lit. Activity  Follow a regular exercise program to stay fit. This will help you maintain your balance. Ask your health care provider what types of exercise are appropriate for you.  If you need a cane or walker,   use it as recommended by your health care provider.  Wear supportive shoes that have nonskid soles.   Lifestyle  Do not drink alcohol if your health care provider tells you not to drink.  If you drink alcohol, limit how much you have: ? 0-1 drink a day for women. ? 0-2 drinks a day for men.  Be aware of how much alcohol is in your drink. In the [place] one drink equals one typical bottle of beer (12  oz), one-half glass of wine (5 oz), or one shot of hard liquor (1 oz).  Do not use any products that contain nicotine or tobacco, such as cigarettes and e-cigarettes. If you need help quitting, ask your health care provider. Summary  Having a healthy lifestyle and getting preventive care can help to protect your health and wellness after age 73.  Screening and testing are the best way to find a health problem early and help you avoid having a fall. Early diagnosis and treatment give you the best chance for managing medical conditions that are more common for people who are older than age 73.  Falls are a major cause of broken bones and head injuries in people who are older than age 73. Take precautions to prevent a fall at home.  Work with your health care provider to learn what changes you can make to improve your health and wellness and to prevent falls. This information is not intended to replace advice given to you by your health care provider. Make sure you discuss any questions you have with your health care provider. Document Revised: 06/08/2018 Document Reviewed: 12/29/2016 Elsevier Patient Education  2021 Elsevier Inc.  

## 2020-07-30 DIAGNOSIS — N3281 Overactive bladder: Secondary | ICD-10-CM | POA: Diagnosis not present

## 2020-07-30 DIAGNOSIS — N319 Neuromuscular dysfunction of bladder, unspecified: Secondary | ICD-10-CM | POA: Diagnosis not present

## 2020-07-30 DIAGNOSIS — K5900 Constipation, unspecified: Secondary | ICD-10-CM | POA: Diagnosis not present

## 2020-08-02 DIAGNOSIS — L578 Other skin changes due to chronic exposure to nonionizing radiation: Secondary | ICD-10-CM | POA: Diagnosis not present

## 2020-08-02 DIAGNOSIS — L821 Other seborrheic keratosis: Secondary | ICD-10-CM | POA: Diagnosis not present

## 2020-08-02 DIAGNOSIS — L820 Inflamed seborrheic keratosis: Secondary | ICD-10-CM | POA: Diagnosis not present

## 2020-08-02 DIAGNOSIS — D485 Neoplasm of uncertain behavior of skin: Secondary | ICD-10-CM | POA: Diagnosis not present

## 2020-08-11 ENCOUNTER — Other Ambulatory Visit: Payer: Self-pay | Admitting: Nurse Practitioner

## 2020-08-11 DIAGNOSIS — Z1231 Encounter for screening mammogram for malignant neoplasm of breast: Secondary | ICD-10-CM

## 2020-08-14 DIAGNOSIS — M5416 Radiculopathy, lumbar region: Secondary | ICD-10-CM | POA: Diagnosis not present

## 2020-08-27 DIAGNOSIS — G4700 Insomnia, unspecified: Secondary | ICD-10-CM | POA: Diagnosis not present

## 2020-08-27 DIAGNOSIS — F411 Generalized anxiety disorder: Secondary | ICD-10-CM | POA: Diagnosis not present

## 2020-08-27 DIAGNOSIS — F3342 Major depressive disorder, recurrent, in full remission: Secondary | ICD-10-CM | POA: Diagnosis not present

## 2020-09-10 ENCOUNTER — Other Ambulatory Visit: Payer: Self-pay | Admitting: Internal Medicine

## 2020-10-03 ENCOUNTER — Other Ambulatory Visit: Payer: Self-pay

## 2020-10-03 ENCOUNTER — Ambulatory Visit
Admission: RE | Admit: 2020-10-03 | Discharge: 2020-10-03 | Disposition: A | Discharge status: Home / Self Care | Payer: PPO | Source: Ambulatory Visit | Attending: Nurse Practitioner | Admitting: Nurse Practitioner

## 2020-10-03 DIAGNOSIS — Z1231 Encounter for screening mammogram for malignant neoplasm of breast: Secondary | ICD-10-CM | POA: Diagnosis not present

## 2020-10-08 ENCOUNTER — Other Ambulatory Visit: Payer: Self-pay | Admitting: Nurse Practitioner

## 2020-10-08 DIAGNOSIS — R928 Other abnormal and inconclusive findings on diagnostic imaging of breast: Secondary | ICD-10-CM

## 2020-10-14 ENCOUNTER — Other Ambulatory Visit: Payer: Self-pay

## 2020-10-14 ENCOUNTER — Encounter: Payer: Self-pay | Admitting: Internal Medicine

## 2020-10-14 ENCOUNTER — Other Ambulatory Visit: Payer: PPO | Admitting: Internal Medicine

## 2020-10-14 ENCOUNTER — Ambulatory Visit (INDEPENDENT_AMBULATORY_CARE_PROVIDER_SITE_OTHER): Payer: PPO | Admitting: Internal Medicine

## 2020-10-14 VITALS — BP 100/70 | HR 100 | Temp 99.6°F | Ht 62.0 in | Wt 120.0 lb

## 2020-10-14 DIAGNOSIS — Z780 Asymptomatic menopausal state: Secondary | ICD-10-CM

## 2020-10-14 DIAGNOSIS — E781 Pure hyperglyceridemia: Secondary | ICD-10-CM

## 2020-10-14 DIAGNOSIS — M8580 Other specified disorders of bone density and structure, unspecified site: Secondary | ICD-10-CM | POA: Diagnosis not present

## 2020-10-14 DIAGNOSIS — R079 Chest pain, unspecified: Secondary | ICD-10-CM

## 2020-10-14 DIAGNOSIS — M810 Age-related osteoporosis without current pathological fracture: Secondary | ICD-10-CM | POA: Diagnosis not present

## 2020-10-14 DIAGNOSIS — E039 Hypothyroidism, unspecified: Secondary | ICD-10-CM | POA: Diagnosis not present

## 2020-10-14 DIAGNOSIS — G250 Essential tremor: Secondary | ICD-10-CM

## 2020-10-14 DIAGNOSIS — Z0000 Encounter for general adult medical examination without abnormal findings: Secondary | ICD-10-CM

## 2020-10-14 DIAGNOSIS — R413 Other amnesia: Secondary | ICD-10-CM | POA: Diagnosis not present

## 2020-10-14 DIAGNOSIS — M48062 Spinal stenosis, lumbar region with neurogenic claudication: Secondary | ICD-10-CM | POA: Diagnosis not present

## 2020-10-14 DIAGNOSIS — K219 Gastro-esophageal reflux disease without esophagitis: Secondary | ICD-10-CM

## 2020-10-14 DIAGNOSIS — A6004 Herpesviral vulvovaginitis: Secondary | ICD-10-CM

## 2020-10-14 LAB — TROPONIN I: Troponin I: 4 ng/L (ref <=47)

## 2020-10-14 LAB — CK, TOTAL(REFL): Total CK: 44 U/L (ref 29–143)

## 2020-10-14 MED ORDER — HYDROMORPHONE HCL 2 MG PO TABS: 2.0000 mg | ORAL_TABLET | ORAL | 0 refills | Status: DC | PRN

## 2020-10-14 NOTE — Progress Notes (Addendum)
   Subjective:    Patient ID: Megan Williams, female    DOB: [birth year], 76 y.o.   MRN: [id number]  HPI 75 year old Female seen acutely today for chest pain.  It seems that she developed chest pain around 1 AM this morning and EMS was called to her home.  They suggested she go to the emergency department but she refused transport.  She is here today for fasting labs in anticipation of Medicare wellness and health maintenance exam on August 23.  She and her husband had planned to go to the beach but since she became ill last evening, they are not going to go.  They plan to go to [facility] in September.  She sees Marny Lowenstein, nurse practitioner for GYN care.  She has a history of hypothyroidism, hypertension, hyperlipidemia, anxiety, depression, COPD and mild memory loss.  Is followed by neurology for mild memory loss.    Followed by Dr. Ellene Route having had multiple back surgeries.  History of osteopenia.  Scheduled for bone density study in September.  Last bone density study August 2020 showed lowest T score of -1.7.  Has been treated with Prolia.    Review of Systems complains of pain in her neck shoulders and anterior chest.  Has palpable point tenderness in multiple areas.  No cough, headache, sore throat     Objective:   Physical Exam Blood pressure 100/70, pulse 100, regular temperature 99.6 degrees pulse oximetry 96% BMI 21.95 weight 120 pounds.  Height 5 feet 2 inches  Skin: Warm and dry.  No cervical adenopathy.  Chest is clear to auscultation.  Cardiac exam: Regular rate and rhythm.  No lower extremity pitting edema.  EKG shows sinus rhythm with no acute changes.  No change from EKG in 2019.  She has some palpable muscle tenderness over posterior neck and right shoulder and right anterior chest wall       Assessment & Plan:  Musculoskeletal pain/chest wall pain  Low-grade fever  Plan: Fasting labs drawn today in anticipation of CPE next week.  Also drew total CK and  troponin 1 levels which were normal.  She was given small prescription for Dilaudid 2 mg to take up to 3 times daily as needed for musculoskeletal pain.  Rest at home and drink plenty of fluids  Addendum: Patient will be contacted regarding white blood cell count which is elevated at 14,300.  She is not anemic.  Platelet count is normal.  Has absolute neutrophilia.  Suggest chest x-ray on Wednesday and repeat CBC on Friday.  Have her take home COVID test.

## 2020-10-14 NOTE — Patient Instructions (Addendum)
Take Dilaudid 2 mg by mouth every 8 hours as needed for pain. Rest for a couple of days. Stay well hydrated. If symptoms do not improve or return go directly to Emergency Dept for evaluation or call EMS. Labs drawn and are pending but will take several hours to result. Follow up here August 23.  Addendum: Has elevated white blood cell count.  She is not anemic.  Has absolute neutrophilia.  Suggest chest x-ray on Wednesday and repeat CBC on Friday.  Have her take home COVID test.

## 2020-10-15 ENCOUNTER — Other Ambulatory Visit: Payer: Self-pay

## 2020-10-15 ENCOUNTER — Telehealth: Payer: Self-pay | Admitting: Internal Medicine

## 2020-10-15 ENCOUNTER — Ambulatory Visit
Admission: RE | Admit: 2020-10-15 | Discharge: 2020-10-15 | Disposition: A | Discharge status: Home / Self Care | Payer: PPO | Source: Ambulatory Visit | Attending: Internal Medicine | Admitting: Internal Medicine

## 2020-10-15 ENCOUNTER — Encounter: Payer: Self-pay | Admitting: Internal Medicine

## 2020-10-15 DIAGNOSIS — R079 Chest pain, unspecified: Secondary | ICD-10-CM

## 2020-10-15 LAB — COMPLETE METABOLIC PANEL WITH GFR
AG Ratio: 1.4 (calc) (ref 1.0–2.5)
ALT: 10 U/L (ref 6–29)
AST: 22 U/L (ref 10–35)
Albumin: 4 g/dL (ref 3.6–5.1)
Alkaline phosphatase (APISO): 41 U/L (ref 37–153)
BUN: 12 mg/dL (ref 7–25)
CO2: 27 mmol/L (ref 20–32)
Calcium: 9.4 mg/dL (ref 8.6–10.4)
Chloride: 102 mmol/L (ref 98–110)
Creat: 0.97 mg/dL (ref 0.60–1.00)
Globulin: 2.8 g/dL (calc) (ref 1.9–3.7)
Glucose, Bld: 113 mg/dL — ABNORMAL HIGH (ref 65–99)
Potassium: 4.8 mmol/L (ref 3.5–5.3)
Sodium: 136 mmol/L (ref 135–146)
Total Bilirubin: 0.7 mg/dL (ref 0.2–1.2)
Total Protein: 6.8 g/dL (ref 6.1–8.1)
eGFR: 62 mL/min/{1.73_m2} (ref >=60)

## 2020-10-15 LAB — LIPID PANEL
Cholesterol: 152 mg/dL (ref <200)
HDL: 73 mg/dL (ref >=50)
LDL Cholesterol (Calc): 66 mg/dL (calc)
Non-HDL Cholesterol (Calc): 79 mg/dL (calc) (ref <130)
Total CHOL/HDL Ratio: 2.1 (calc) (ref <5.0)
Triglycerides: 54 mg/dL (ref <150)

## 2020-10-15 LAB — CBC WITH DIFFERENTIAL/PLATELET
Absolute Monocytes: 872 cells/uL (ref 200–950)
Basophils Absolute: 14 cells/uL (ref 0–200)
Basophils Relative: 0.1 %
Eosinophils Absolute: 0 cells/uL — ABNORMAL LOW (ref 15–500)
Eosinophils Relative: 0 %
HCT: 40.5 % (ref 35.0–45.0)
Hemoglobin: 13.5 g/dL (ref 11.7–15.5)
Lymphs Abs: 1344 cells/uL (ref 850–3900)
MCH: 31.6 pg (ref 27.0–33.0)
MCHC: 33.3 g/dL (ref 32.0–36.0)
MCV: 94.8 fL (ref 80.0–100.0)
MPV: 9.5 fL (ref 7.5–12.5)
Monocytes Relative: 6.1 %
Neutro Abs: 12069 cells/uL — ABNORMAL HIGH (ref 1500–7800)
Neutrophils Relative %: 84.4 %
Platelets: 296 10*3/uL (ref 140–400)
RBC: 4.27 10*6/uL (ref 3.80–5.10)
RDW: 13.1 % (ref 11.0–15.0)
Total Lymphocyte: 9.4 %
WBC: 14.3 10*3/uL — ABNORMAL HIGH (ref 3.8–10.8)

## 2020-10-15 LAB — TSH: TSH: 0.32 mIU/L — ABNORMAL LOW (ref 0.40–4.50)

## 2020-10-15 LAB — VITAMIN D 25 HYDROXY (VIT D DEFICIENCY, FRACTURES): Vit D, 25-Hydroxy: 61 ng/mL (ref 30–100)

## 2020-10-15 NOTE — Telephone Encounter (Signed)
Faxed Lab results to Dr Wandra Feinstein [phone], phone [phone]

## 2020-10-15 NOTE — Progress Notes (Signed)
N

## 2020-10-16 ENCOUNTER — Telehealth: Payer: Self-pay | Admitting: Internal Medicine

## 2020-10-16 NOTE — Telephone Encounter (Signed)
Alexandre called to say her home COVID test was negative

## 2020-10-17 ENCOUNTER — Other Ambulatory Visit: Payer: PPO | Admitting: Internal Medicine

## 2020-10-17 ENCOUNTER — Other Ambulatory Visit: Payer: Self-pay

## 2020-10-17 DIAGNOSIS — R079 Chest pain, unspecified: Secondary | ICD-10-CM | POA: Diagnosis not present

## 2020-10-17 LAB — CBC WITH DIFFERENTIAL/PLATELET
Absolute Monocytes: 559 cells/uL (ref 200–950)
Basophils Absolute: 16 cells/uL (ref 0–200)
Basophils Relative: 0.2 %
Eosinophils Absolute: 32 cells/uL (ref 15–500)
Eosinophils Relative: 0.4 %
HCT: 38.4 % (ref 35.0–45.0)
Hemoglobin: 12.6 g/dL (ref 11.7–15.5)
Lymphs Abs: 1814 cells/uL (ref 850–3900)
MCH: 31.3 pg (ref 27.0–33.0)
MCHC: 32.8 g/dL (ref 32.0–36.0)
MCV: 95.3 fL (ref 80.0–100.0)
MPV: 9.2 fL (ref 7.5–12.5)
Monocytes Relative: 6.9 %
Neutro Abs: 5678 cells/uL (ref 1500–7800)
Neutrophils Relative %: 70.1 %
Platelets: 327 10*3/uL (ref 140–400)
RBC: 4.03 10*6/uL (ref 3.80–5.10)
RDW: 12.6 % (ref 11.0–15.0)
Total Lymphocyte: 22.4 %
WBC: 8.1 10*3/uL (ref 3.8–10.8)

## 2020-10-17 LAB — PATHOLOGIST SMEAR REVIEW

## 2020-10-21 ENCOUNTER — Encounter: Payer: Self-pay | Admitting: Internal Medicine

## 2020-10-21 ENCOUNTER — Ambulatory Visit (INDEPENDENT_AMBULATORY_CARE_PROVIDER_SITE_OTHER): Payer: PPO | Admitting: Internal Medicine

## 2020-10-21 ENCOUNTER — Other Ambulatory Visit: Payer: Self-pay

## 2020-10-21 ENCOUNTER — Ambulatory Visit
Admission: RE | Admit: 2020-10-21 | Discharge: 2020-10-21 | Disposition: A | Discharge status: Home / Self Care | Payer: PPO | Source: Ambulatory Visit | Attending: Internal Medicine | Admitting: Internal Medicine

## 2020-10-21 VITALS — BP 100/70 | HR 102 | Temp 98.8°F | Ht 62.0 in | Wt 119.0 lb

## 2020-10-21 DIAGNOSIS — M8580 Other specified disorders of bone density and structure, unspecified site: Secondary | ICD-10-CM | POA: Diagnosis not present

## 2020-10-21 DIAGNOSIS — F419 Anxiety disorder, unspecified: Secondary | ICD-10-CM | POA: Diagnosis not present

## 2020-10-21 DIAGNOSIS — R7989 Other specified abnormal findings of blood chemistry: Secondary | ICD-10-CM | POA: Diagnosis not present

## 2020-10-21 DIAGNOSIS — Z0000 Encounter for general adult medical examination without abnormal findings: Secondary | ICD-10-CM | POA: Diagnosis not present

## 2020-10-21 DIAGNOSIS — K219 Gastro-esophageal reflux disease without esophagitis: Secondary | ICD-10-CM

## 2020-10-21 DIAGNOSIS — M545 Low back pain, unspecified: Secondary | ICD-10-CM | POA: Diagnosis not present

## 2020-10-21 DIAGNOSIS — Z961 Presence of intraocular lens: Secondary | ICD-10-CM | POA: Diagnosis not present

## 2020-10-21 DIAGNOSIS — H16223 Keratoconjunctivitis sicca, not specified as Sjogren's, bilateral: Secondary | ICD-10-CM | POA: Diagnosis not present

## 2020-10-21 DIAGNOSIS — Z87891 Personal history of nicotine dependence: Secondary | ICD-10-CM

## 2020-10-21 DIAGNOSIS — H524 Presbyopia: Secondary | ICD-10-CM | POA: Diagnosis not present

## 2020-10-21 DIAGNOSIS — H43813 Vitreous degeneration, bilateral: Secondary | ICD-10-CM | POA: Diagnosis not present

## 2020-10-21 DIAGNOSIS — G8929 Other chronic pain: Secondary | ICD-10-CM | POA: Diagnosis not present

## 2020-10-21 DIAGNOSIS — H5203 Hypermetropia, bilateral: Secondary | ICD-10-CM | POA: Diagnosis not present

## 2020-10-21 DIAGNOSIS — R413 Other amnesia: Secondary | ICD-10-CM | POA: Diagnosis not present

## 2020-10-21 DIAGNOSIS — M25511 Pain in right shoulder: Secondary | ICD-10-CM | POA: Diagnosis not present

## 2020-10-21 DIAGNOSIS — H02834 Dermatochalasis of left upper eyelid: Secondary | ICD-10-CM | POA: Diagnosis not present

## 2020-10-21 DIAGNOSIS — Z981 Arthrodesis status: Secondary | ICD-10-CM | POA: Diagnosis not present

## 2020-10-21 DIAGNOSIS — F32A Depression, unspecified: Secondary | ICD-10-CM

## 2020-10-21 DIAGNOSIS — M7918 Myalgia, other site: Secondary | ICD-10-CM

## 2020-10-21 DIAGNOSIS — M19011 Primary osteoarthritis, right shoulder: Secondary | ICD-10-CM | POA: Diagnosis not present

## 2020-10-21 DIAGNOSIS — H52203 Unspecified astigmatism, bilateral: Secondary | ICD-10-CM | POA: Diagnosis not present

## 2020-10-21 DIAGNOSIS — H02831 Dermatochalasis of right upper eyelid: Secondary | ICD-10-CM | POA: Diagnosis not present

## 2020-10-21 LAB — POCT URINALYSIS DIPSTICK
Appearance: NEGATIVE
Bilirubin, UA: NEGATIVE
Blood, UA: NEGATIVE
Glucose, UA: NEGATIVE
Ketones, UA: NEGATIVE
Leukocytes, UA: NEGATIVE
Nitrite, UA: NEGATIVE
Odor: NEGATIVE
Protein, UA: NEGATIVE
Spec Grav, UA: 1.01 (ref 1.010–1.025)
Urobilinogen, UA: 0.2 E.U./dL
pH, UA: 6.5 (ref 5.0–8.0)

## 2020-10-21 NOTE — Patient Instructions (Addendum)
Have bone density study which is scheduled for September. Rheumatology studies ordered. Order X-ray of right shoulder because of pain. TSH is slightly low. Repeat TSH and free T4 in 3 months.  We will review rheumatology studies and further recommendations to follow.

## 2020-10-21 NOTE — Progress Notes (Signed)
Subjective:    Patient ID: Megan Williams, female    DOB: 07-Mar-1945, 75 y.o.   MRN: [id number]  HPI 75 year old Female  seen for health maintenance exam and evaluation of medical issues. Is on Prolia per Dr. Layne Benton. Last dose of Prolia was in May.  Colonoscopy due 2028.  Recent mammogram showed asymmetry in left breast and ultrasound scheduled.  She was seen here on August 16 acutely after having chest pain around 1 AM.   EMS was called to her home but she would not go to the hospital.  She came in on August 16 for fasting labs in anticipation of her physical examination and mentioned having chest pain and was seen acutely.  Chest x-ray on August 17 revealed an irregular opacity in the peripheral lung base favored to be atelectasis or scarring.  Follow-up x-ray was recommended in 6 weeks but I went ahead and ordered CT of chest with contrast because she was having chest pain.  On August 16 total CK was 44, troponin was 4, CBC showed white blood cell count of 14,300 and hemoglobin was 13.5 g.  CBC was repeated on August 19 and white count had improved to 8100.  When seen on August 16 was given Dilaudid for pain.  EKG showed no acute changes at that time.  Review of smear showed mature segmented neutrophils with mild reactive changes.  ANA was negative, CCP was negative.  Total CK was 44.  She has a history of hypothyroidism, hypertension, hyperlipidemia, anxiety, depression, COPD and mild memory loss.  Is followed by neurology for mild memory loss.  Is followed by Dr. Ellene Route and has had multiple back surgeries.  She has a history of osteopenia and has been treated with Prolia.  She and her husband have been dealing with a sick dog recently.  They are hoping to go on vacation to [place] in the near future.  Sees Noemi Chapel, nurse practitioner for depression  She is still having considerable pain.  We are going to pursue CCP, ANA, rheumatoid factor and sed rate as well as serum  protein electrophoresis in view of abnormal chest x-ray.  Chest CT will be ordered.  Hoping to go to [place] to visit with family members Labor Day weekend.  She sees Marny Lowenstein, nurse practitioner for GYN care.  She had COVID-19 in September 2021.  She has had 3 COVID vaccines according to our records.  Has had Prevnar 13 and pneumococcal 23 vaccines and tetanus immunization was given in 2020.  Has annual flu vaccine.  Social history: Married with 2 adult children.  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  Family history unknown as she is adopted.  Tonsillectomy 1963, hysterectomy without oophorectomy 1979, cholecystectomy 1993, thyroidectomy 1968  Continues to smoke although has tried Chantix  Has seen Dr. Gerlene Burdock in [place] for overactive bladder  History of GE reflux treated with PPI  Had normal colonoscopy 2018 by Dr. Watt Climes  Had joint reconstruction of right thumb 2008 by Dr. Daylene Katayama  In 2018 she fell onto her knees and had increasing pain and left buttock and in December 2019 had decompression L3-L4 by Dr. Ellene Route  In 2012 she had L4-L5 decompression stabilization by Dr. Ellene Route.  She also had cervical spine 3 level fusion C4-C7 by Dr. Ellene Route in 2005  History  of T10 compression fracture in 2017 after a fall in the shower.    Review of Systems Hx osteopenia. Got Prolia from Dr. Layne Benton November 2021 and bone density is scheduled for the Fall. Last bone density had T score of -1.7.  Grieving loss of her dog yesterday.  Dog had been ill for a number of days and decision was made to euthanize.  She is saying it is appropriately grieving.     Objective:   Physical Exam Vital signs reviewed.  Pulse is 102.  Blood pressure 100/70 temperature 98.8 degrees pulse oximetry 95% BMI 21.77 chest is clear to auscultation.  Cardiac exam:  Tachycardia regular rate and rhythm without murmurs rubs or gallops.  No lower extremity pitting edema.  Has some palpable discomfort in her shoulders.  She looks a bit frail and is thin.  Does not appear to be in acute distress today at this time.  Has been taking Dilaudid with relief from what is believed to be musculoskeletal pain.       Assessment & Plan:  Musculoskeletal pain upper shoulders currently being treated sparingly with Dilaudid.  Etiology is not clear to me at this time but she seems uncomfortable.  Question rheumatology condition such as polymyalgia rheumatica.  Consider malignancy such as myeloma.  Recently called EMS to her home regarding chest and shoulder pain but refused to be transported to emergency department.  Was seen here the following day for planned lab work in anticipation of physical exam.  EKG at that time showed no acute changes.  Osteopenia treated with Prolia by Dr. Layne Benton  Abnormal chest x-ray-to have CT  Mild memory loss  Grief reaction and loss of her dog recently  GE reflux treated with Protonix  Is on estrogen replacement per GYN nurse practitioner  Hypothyroidism treated with thyroid replacement medication  Right shoulder pain-to have right shoulder x-ray-x-ray showed normal glenohumeral alignment without degenerative change.  No evidence of fracture.  History of hypertriglyceridemia treated with fenofibrate  Memory loss followed at [facility] and treated with Namenda  History of lumbar radiculopathy status post multiple surgeries by Dr. Ellene Route  Plan: Proceed with chest CT.  Drawl rheumatology studies.?  Polymyalgia.  Consider multiple myeloma.   Subjective:   Patient presents for Medicare Annual/Subsequent preventive examination.  Review Past Medical/Family/Social: See above   Risk Factors  Current exercise habits: Light Dietary issues discussed: Yes  Cardiac risk factors: Hypertriglyceridemia treated with  medication  Depression Screen  (Note: if answer to either of the following is "Yes", a more complete depression screening is indicated)   Over the past two weeks, have you felt down, depressed or hopeless? No  Over the past two weeks, have you felt little interest or pleasure in doing things? No Have you lost interest or pleasure in daily life? No Do you often feel hopeless? No Do you cry easily over simple problems? No   Activities of Daily Living  In your present state of health, do you have any difficulty performing the following activities?:   Driving? No  Managing money? No  Feeding yourself? No  Getting from bed to chair? No  Climbing a flight of stairs? No  Preparing food and eating?: No  Bathing or showering? No  Getting dressed: No  Getting to the toilet? No  Using the toilet:No  Moving around from place to place: No  In the past year have you fallen or had a near fall?:No  Are you sexually active? No  Do you  have more than one partner? No   Hearing Difficulties: No  Do you often ask people to speak up or repeat themselves? No  Do you experience ringing or noises in your ears? No  Do you have difficulty understanding soft or whispered voices? No  Do you feel that you have a problem with memory? No Do you often misplace items? No    Home Safety:  Do you have a smoke alarm at your residence?  No Do you have grab bars in the bathroom?  Yes Do you have throw rugs in your house?  No   Cognitive Testing  Alert? Yes Normal Appearance?Yes  Oriented to person? Yes Place? Yes  Time? Yes  Recall of three objects? Yes  Can perform simple calculations? Yes  Displays appropriate judgment?Yes  Can read the correct time from a watch face?Yes   List the Names of Other Physician/Practitioners you currently use:  See referral list for the physicians patient is currently seeing.     Review of Systems: See above   Objective:     General appearance: Appears stated  age and seen Head: Normocephalic, without obvious abnormality, atraumatic  Eyes: conj clear, EOMi PEERLA  Ears: normal TM's and external ear canals both ears  Nose: Nares normal. Septum midline. Mucosa normal. No drainage or sinus tenderness.  Throat: lips, mucosa, and tongue normal; teeth and gums normal  Neck: no adenopathy, no carotid bruit, no JVD, supple, symmetrical, trachea midline and thyroid not enlarged, symmetric, no tenderness/mass/nodules  No CVA tenderness.  Lungs: clear to auscultation bilaterally  Breasts: normal appearance, no masses or tenderness Heart: regular rate and rhythm, S1, S2 normal, no murmur, click, rub or gallop  Abdomen: soft, non-tender; bowel sounds normal; no masses, no organomegaly  Skin: Skin color, texture, turgor normal. No rashes or lesions  Lymph nodes: Cervical, supraclavicular, and axillary nodes normal.  Neurologic: CN 2 -12 Normal, Normal symmetric reflexes. Normal coordination and gait  Psych: Alert & Oriented x 3, is in pain today   Assessment:    Annual wellness medicare exam   Plan:    During the course of the visit the patient was educated and counseled about appropriate screening and preventive services including:   To have COVID booster in the fall.  Has had 2 pneumococcal vaccines and tetanus immunization is up-to-date.     Patient Instructions (the written plan) was given to the patient.  Medicare Attestation  I have personally reviewed:  The patient's medical and social history  Their use of alcohol, tobacco or illicit drugs  Their current medications and supplements  The patient's functional ability including ADLs,fall risks, home safety risks, cognitive, and hearing and visual impairment  Diet and physical activities  Evidence for depression or mood disorders  The patient's weight, height, BMI, and visual acuity have been recorded in the chart. I have made referrals, counseling, and provided education to the patient based  on review of the above and I have provided the patient with a written personalized care plan for preventive services.

## 2020-10-22 LAB — RHEUMATOID FACTOR: Rheumatoid fact SerPl-aCnc: <14 IU/mL (ref <14)

## 2020-10-22 LAB — CYCLIC CITRUL PEPTIDE ANTIBODY, IGG: Cyclic Citrullin Peptide Ab: <16 UNITS

## 2020-10-22 LAB — SEDIMENTATION RATE: Sed Rate: 128 mm/h — ABNORMAL HIGH (ref 0–30)

## 2020-10-22 LAB — ANA: Anti Nuclear Antibody (ANA): NEGATIVE

## 2020-10-23 ENCOUNTER — Other Ambulatory Visit: Payer: Self-pay

## 2020-10-23 ENCOUNTER — Ambulatory Visit
Admission: RE | Admit: 2020-10-23 | Discharge: 2020-10-23 | Disposition: A | Discharge status: Home / Self Care | Payer: PPO | Source: Ambulatory Visit | Attending: Nurse Practitioner | Admitting: Nurse Practitioner

## 2020-10-23 ENCOUNTER — Ambulatory Visit: Payer: PPO

## 2020-10-23 DIAGNOSIS — R922 Inconclusive mammogram: Secondary | ICD-10-CM | POA: Diagnosis not present

## 2020-10-23 DIAGNOSIS — R928 Other abnormal and inconclusive findings on diagnostic imaging of breast: Secondary | ICD-10-CM | POA: Diagnosis not present

## 2020-10-27 ENCOUNTER — Encounter: Payer: Self-pay | Admitting: Internal Medicine

## 2020-10-27 ENCOUNTER — Other Ambulatory Visit: Payer: Self-pay

## 2020-10-27 ENCOUNTER — Ambulatory Visit (INDEPENDENT_AMBULATORY_CARE_PROVIDER_SITE_OTHER): Payer: PPO | Admitting: Internal Medicine

## 2020-10-27 VITALS — BP 110/78 | HR 102 | Temp 99.8°F | Ht 62.0 in | Wt 117.0 lb

## 2020-10-27 DIAGNOSIS — R9389 Abnormal findings on diagnostic imaging of other specified body structures: Secondary | ICD-10-CM

## 2020-10-27 DIAGNOSIS — R700 Elevated erythrocyte sedimentation rate: Secondary | ICD-10-CM

## 2020-10-27 DIAGNOSIS — M898X9 Other specified disorders of bone, unspecified site: Secondary | ICD-10-CM | POA: Diagnosis not present

## 2020-10-27 NOTE — Progress Notes (Signed)
   Subjective:    Patient ID: Megan Williams, female    DOB: 07-27-1945, 75 y.o.   MRN: [id number]  HPI 75 year old Female seen today at my request in follow up of CPE August 23. At that time, was complaining of musculoskeletal pain shoulders and upper back. Initially had called ambulance to home around 1am on August 16th for chest pain.  She has a history of hypothyroidism, hypertension, hyperlipidemia, anxiety, depression, COPD and mild memory loss.  She has had several back surgeries with Dr.Elsner.  She has a history of osteopenia and is scheduled for bone density study in September.  Last bone density study in August 2020 showed lowest T score of -1.7.  She has been treated with Prolia.  Her sed rate and level is 128.  ANA is negative.  CCP is less than 16.  High sed rate with musculoskeletal pain would be consistent with Polymyalgia rheumatica.  However we are going to do additional lab testing including serum protein electrophoresis and immunofixation.  Also, she has abnormal chest x-ray which was done on August 17 showing an irregular opacity in the peripheral base of left lung thought to be atelectasis or scarring but was new from prior exam in 2018.  I am suggested patient go ahead and have CT of the chest and she has other findings including musculoskeletal pain and high sed rate.  She says pain medication has not helped her.  Since Valium seems to help calm her down so she can get some rest.  We will make sure she has a refill on Valium.  This was previously prescribed by Noemi Chapel, her psychiatry nurse practitioner.  Patient and her husband plan to vacation in [place] next week.  They are meeting family daycare.    Review of Systems see above-continues to have musculoskeletal pain in shoulders and upper chest     Objective:   Physical Exam Blood pressure 110/78 pulse 102 temperature 99.8 degrees pulse oximetry 95% weight 117 pounds BMI 21.40  She is in mild  distress today.  Husband is with her today.  Spoke with husband about her symptoms and he is agreeable to further work-up.  They would like to go on their vacation and not postpone it at this time.       Assessment & Plan:  Musculoskeletal pain in the setting of high sed rate with low-grade temp.  Abnormal chest x-ray  Plan: Considerations include multiple myeloma which we will be checking for and polymyalgia rheumatica.  Patient is going to likely need Rheumatology consultation if lab work including SPEP and immunofixation proved to be negative along with chest CT.  We will try to get chest CT done this week.  She can take Valium sparingly for pain since narcotic pain medication does not help.

## 2020-10-27 NOTE — Patient Instructions (Signed)
Take Valium sparingly once or twice daily for musculoskeletal pain.  With high sed rate, malignancy and inflammatory conditions need to be ruled out.  Further testing is warranted.  May need rheumatology consultation.  Needs to have CT of chest as soon as possible.

## 2020-10-28 ENCOUNTER — Other Ambulatory Visit: Payer: PPO | Admitting: Internal Medicine

## 2020-10-28 ENCOUNTER — Other Ambulatory Visit: Payer: Self-pay

## 2020-10-28 ENCOUNTER — Emergency Department (HOSPITAL_COMMUNITY): Payer: PPO

## 2020-10-28 ENCOUNTER — Telehealth: Payer: Self-pay

## 2020-10-28 ENCOUNTER — Encounter (HOSPITAL_COMMUNITY): Payer: Self-pay | Admitting: Emergency Medicine

## 2020-10-28 ENCOUNTER — Ambulatory Visit
Admission: RE | Admit: 2020-10-28 | Discharge: 2020-10-28 | Disposition: A | Discharge status: Home / Self Care | Payer: PPO | Source: Ambulatory Visit | Attending: Internal Medicine | Admitting: Internal Medicine

## 2020-10-28 ENCOUNTER — Observation Stay (HOSPITAL_COMMUNITY)
Admission: EM | Admit: 2020-10-28 | Discharge: 2020-10-30 | Disposition: A | Discharge status: Home / Self Care | Payer: PPO | Attending: Cardiovascular Disease | Admitting: Cardiovascular Disease

## 2020-10-28 DIAGNOSIS — F17200 Nicotine dependence, unspecified, uncomplicated: Secondary | ICD-10-CM | POA: Insufficient documentation

## 2020-10-28 DIAGNOSIS — J449 Chronic obstructive pulmonary disease, unspecified: Secondary | ICD-10-CM | POA: Insufficient documentation

## 2020-10-28 DIAGNOSIS — E785 Hyperlipidemia, unspecified: Secondary | ICD-10-CM | POA: Diagnosis present

## 2020-10-28 DIAGNOSIS — Z91040 Latex allergy status: Secondary | ICD-10-CM | POA: Insufficient documentation

## 2020-10-28 DIAGNOSIS — J432 Centrilobular emphysema: Secondary | ICD-10-CM | POA: Diagnosis not present

## 2020-10-28 DIAGNOSIS — M898X9 Other specified disorders of bone, unspecified site: Secondary | ICD-10-CM

## 2020-10-28 DIAGNOSIS — Z87891 Personal history of nicotine dependence: Secondary | ICD-10-CM

## 2020-10-28 DIAGNOSIS — Z79899 Other long term (current) drug therapy: Secondary | ICD-10-CM | POA: Insufficient documentation

## 2020-10-28 DIAGNOSIS — Z20822 Contact with and (suspected) exposure to covid-19: Secondary | ICD-10-CM | POA: Diagnosis not present

## 2020-10-28 DIAGNOSIS — I309 Acute pericarditis, unspecified: Secondary | ICD-10-CM | POA: Diagnosis not present

## 2020-10-28 DIAGNOSIS — E039 Hypothyroidism, unspecified: Secondary | ICD-10-CM | POA: Diagnosis not present

## 2020-10-28 DIAGNOSIS — I10 Essential (primary) hypertension: Secondary | ICD-10-CM | POA: Insufficient documentation

## 2020-10-28 DIAGNOSIS — G3184 Mild cognitive impairment, so stated: Secondary | ICD-10-CM

## 2020-10-28 DIAGNOSIS — K219 Gastro-esophageal reflux disease without esophagitis: Secondary | ICD-10-CM | POA: Diagnosis present

## 2020-10-28 DIAGNOSIS — K838 Other specified diseases of biliary tract: Secondary | ICD-10-CM | POA: Diagnosis not present

## 2020-10-28 DIAGNOSIS — R700 Elevated erythrocyte sedimentation rate: Secondary | ICD-10-CM

## 2020-10-28 DIAGNOSIS — G250 Essential tremor: Secondary | ICD-10-CM

## 2020-10-28 DIAGNOSIS — I313 Pericardial effusion (noninflammatory): Secondary | ICD-10-CM | POA: Diagnosis not present

## 2020-10-28 DIAGNOSIS — R9389 Abnormal findings on diagnostic imaging of other specified body structures: Secondary | ICD-10-CM

## 2020-10-28 DIAGNOSIS — R269 Unspecified abnormalities of gait and mobility: Secondary | ICD-10-CM

## 2020-10-28 DIAGNOSIS — F419 Anxiety disorder, unspecified: Secondary | ICD-10-CM

## 2020-10-28 DIAGNOSIS — I480 Paroxysmal atrial fibrillation: Secondary | ICD-10-CM | POA: Diagnosis present

## 2020-10-28 DIAGNOSIS — R0602 Shortness of breath: Secondary | ICD-10-CM | POA: Diagnosis not present

## 2020-10-28 DIAGNOSIS — J90 Pleural effusion, not elsewhere classified: Secondary | ICD-10-CM | POA: Diagnosis not present

## 2020-10-28 DIAGNOSIS — M48062 Spinal stenosis, lumbar region with neurogenic claudication: Secondary | ICD-10-CM | POA: Diagnosis present

## 2020-10-28 DIAGNOSIS — R3915 Urgency of urination: Secondary | ICD-10-CM

## 2020-10-28 DIAGNOSIS — I3139 Other pericardial effusion (noninflammatory): Secondary | ICD-10-CM | POA: Diagnosis present

## 2020-10-28 LAB — BASIC METABOLIC PANEL
Anion gap: 11 (ref 5–15)
BUN: 8 mg/dL (ref 8–23)
CO2: 22 mmol/L (ref 22–32)
Calcium: 8.1 mg/dL — ABNORMAL LOW (ref 8.9–10.3)
Chloride: 98 mmol/L (ref 98–111)
Creatinine, Ser: 0.99 mg/dL (ref 0.44–1.00)
GFR, Estimated: >60 mL/min (ref >60)
Glucose, Bld: 133 mg/dL — ABNORMAL HIGH (ref 70–99)
Potassium: 3.5 mmol/L (ref 3.5–5.1)
Sodium: 131 mmol/L — ABNORMAL LOW (ref 135–145)

## 2020-10-28 LAB — RESP PANEL BY RT-PCR (FLU A&B, COVID) ARPGX2
Influenza A by PCR: NEGATIVE
Influenza B by PCR: NEGATIVE
SARS Coronavirus 2 by RT PCR: NEGATIVE

## 2020-10-28 LAB — CBC
HCT: 33.8 % — ABNORMAL LOW (ref 36.0–46.0)
Hemoglobin: 11.1 g/dL — ABNORMAL LOW (ref 12.0–15.0)
MCH: 30.7 pg (ref 26.0–34.0)
MCHC: 32.8 g/dL (ref 30.0–36.0)
MCV: 93.6 fL (ref 80.0–100.0)
Platelets: 661 10*3/uL — ABNORMAL HIGH (ref 150–400)
RBC: 3.61 MIL/uL — ABNORMAL LOW (ref 3.87–5.11)
RDW: 13.4 % (ref 11.5–15.5)
WBC: 13.1 10*3/uL — ABNORMAL HIGH (ref 4.0–10.5)
nRBC: 0 % (ref 0.0–0.2)

## 2020-10-28 LAB — BRAIN NATRIURETIC PEPTIDE: B Natriuretic Peptide: 196.4 pg/mL — ABNORMAL HIGH (ref 0.0–100.0)

## 2020-10-28 LAB — PROTIME-INR
INR: 1.3 — ABNORMAL HIGH (ref 0.8–1.2)
Prothrombin Time: 15.9 seconds — ABNORMAL HIGH (ref 11.4–15.2)

## 2020-10-28 LAB — TROPONIN I (HIGH SENSITIVITY)
Troponin I (High Sensitivity): 578 ng/L (ref <18)
Troponin I (High Sensitivity): 480 ng/L (ref <18)

## 2020-10-28 LAB — APTT: aPTT: 46 seconds — ABNORMAL HIGH (ref 24–36)

## 2020-10-28 MED ORDER — AMIODARONE LOAD VIA INFUSION
150.0000 mg | Freq: Once | INTRAVENOUS | Status: AC
  Administered 2020-10-28: 150 mg via INTRAVENOUS
  Filled 2020-10-28: qty 83.34

## 2020-10-28 MED ORDER — AMIODARONE HCL IN DEXTROSE 360-4.14 MG/200ML-% IV SOLN: 30.0000 mg/h | INTRAVENOUS | Status: DC

## 2020-10-28 MED ORDER — HEPARIN (PORCINE) 25000 UT/250ML-% IV SOLN
1250.0000 [IU]/h | INTRAVENOUS | Status: DC
  Administered 2020-10-28: 650 [IU]/h via INTRAVENOUS
  Administered 2020-10-29: 1050 [IU]/h via INTRAVENOUS
  Filled 2020-10-28 (×2): qty 250

## 2020-10-28 MED ORDER — IOPAMIDOL (ISOVUE-300) INJECTION 61%
75.0000 mL | Freq: Once | INTRAVENOUS | Status: AC | PRN
  Administered 2020-10-28: 75 mL via INTRAVENOUS

## 2020-10-28 MED ORDER — COLCHICINE 0.6 MG PO TABS
0.6000 mg | ORAL_TABLET | Freq: Two times a day (BID) | ORAL | Status: AC
  Administered 2020-10-28 – 2020-10-29 (×2): 0.6 mg via ORAL
  Filled 2020-10-28 (×3): qty 1

## 2020-10-28 MED ORDER — COLCHICINE 0.6 MG PO TABS
0.6000 mg | ORAL_TABLET | Freq: Every day | ORAL | Status: DC
  Administered 2020-10-30: 0.6 mg via ORAL
  Filled 2020-10-28: qty 1

## 2020-10-28 MED ORDER — IBUPROFEN 600 MG PO TABS
600.0000 mg | ORAL_TABLET | Freq: Three times a day (TID) | ORAL | Status: DC
  Administered 2020-10-28 – 2020-10-30 (×5): 600 mg via ORAL
  Filled 2020-10-28 (×5): qty 1

## 2020-10-28 MED ORDER — AMIODARONE HCL IN DEXTROSE 360-4.14 MG/200ML-% IV SOLN
60.0000 mg/h | INTRAVENOUS | Status: DC
  Administered 2020-10-28: 60 mg/h via INTRAVENOUS
  Filled 2020-10-28: qty 200

## 2020-10-28 MED ORDER — METHYLPREDNISOLONE 4 MG PO TABS: ORAL_TABLET | ORAL | 0 refills | Status: DC

## 2020-10-28 MED ORDER — HEPARIN BOLUS VIA INFUSION
3000.0000 [IU] | Freq: Once | INTRAVENOUS | Status: AC
  Administered 2020-10-28: 3000 [IU] via INTRAVENOUS
  Filled 2020-10-28: qty 3000

## 2020-10-28 NOTE — ED Triage Notes (Signed)
Pt c/o shortness of breath and intermittent chest pain x 1 week. Had an outpatient CT done today and was told to come to ED for pericarditis.

## 2020-10-28 NOTE — Progress Notes (Signed)
ANTICOAGULATION CONSULT NOTE - Initial Consult  Pharmacy Consult for heparin Indication: atrial fibrillation  Allergies  Allergen Reactions   Doxycycline Other (See Comments)    Skin peeling    Penicillins Hives    Has patient had a PCN reaction causing immediate rash, facial/tongue/throat swelling, SOB or lightheadedness with hypotension: No Has patient had a PCN reaction causing severe rash involving mucus membranes or skin necrosis: No Has patient had a PCN reaction that required hospitalization: No Has patient had a PCN reaction occurring within the last 10 years: No If all of the above answers are "NO", then may proceed with Cephalosporin use.   Celebrex [Celecoxib] Other (See Comments)    Blistering, pain, swelling   Cephalosporins Nausea Only    Diarrhea, hives   Gabapentin     Causes TICS    Morphine And Related Itching   Latex Rash    Patient Measurements:   Heparin Dosing Weight: 53.1 kg  Vital Signs: Temp: 99.7 F (37.6 C) (08/30 1718) Temp Source: Oral (08/30 1718) BP: 107/80 (08/30 2045) Pulse Rate: 143 (08/30 2045)  Labs: Recent Labs    10/28/20 1722  HGB 11.1*  HCT 33.8*  PLT 661*  CREATININE 0.99  TROPONINIHS 578*    Estimated Creatinine Clearance: 40 mL/min (by C-G formula based on SCr of 0.99 mg/dL).   Medical History: Past Medical History:  Diagnosis Date   Allergy    Anxiety    Arthritis    Complication of anesthesia    "difficulty waking up. I could hear them but I couldn't wake up"   COPD (chronic obstructive pulmonary disease) (HCC)    Depression    Emphysema    Esophagitis    Herpes simplex    Hypertension    Hypothyroidism    Migraines    Osteopenia    Vitamin D deficiency     Medications: see MAR  Assessment: 75 yo F with new onset Afib. No cardiac hx. Heparin consult for AC, no AC PTA.  Goal of Therapy:  Heparin level 0.3-0.7 units/ml Monitor platelets by anticoagulation protocol: Yes   Plan:  Give 3000 units  bolus x 1 Start heparin infusion at 650 units/hr Check anti-Xa level in 6-8 hours and daily while on heparin Continue to monitor H&H and platelets  Joetta Manners, PharmD, [facility] Emergency Medicine Clinical Pharmacist ED RPh Phone: [place]: [phone]

## 2020-10-28 NOTE — ED Notes (Signed)
MD Tegeler and PA Aberman at bedside to do vagal maneuver.

## 2020-10-28 NOTE — ED Provider Notes (Signed)
[facility] EMERGENCY DEPARTMENT Provider Note   CSN: [id number] Arrival date & time: 10/28/20  1707     History Chief Complaint  Patient presents with   Shortness of Breath    Megan Williams is a 75 y.o. female with a past medical history significant for COPD, depression, anxiety, hypertension, hypothyroidism, and osteopenia who presents to the ED due to constant chest pain x1.5 weeks.  Patient states chest pain is located around the entire anterior chest wall.  Pain is worse with exertion and movement, especially rolling over in bed.  Chest pain associated with shortness of breath.  Patient also endorses upper back pain that radiates behind bilateral ears.  Patient was evaluated by PCP today where a CT chest was ordered which demonstrated a moderate pleural effusion suspicious for pericarditis and bilateral pleural effusions.  Patient was sent to the ED for further evaluation.  Patient denies history of blood clots, recent surgeries or recent long immobilizations, no medical treatments.  Denies lower extremity edema.  No cardiac history.  No previous MI or CVA.  Admits to tobacco use. Unsure about family history because patient was adopted. No fever or chills. No treatment prior to arrival.  History obtained from patient and past medical records. No interpreter used during encounter.       Past Medical History:  Diagnosis Date   Allergy    Anxiety    Arthritis    Complication of anesthesia    "difficulty waking up. I could hear them but I couldn't wake up"   COPD (chronic obstructive pulmonary disease) ([place])    Depression    Emphysema    Esophagitis    Herpes simplex    Hypertension    Hypothyroidism    Migraines    Osteopenia    Vitamin D deficiency     Patient Active Problem List   Diagnosis Date Noted   Acute pericarditis 10/28/2020   Essential tremor 05/21/2020   GERD (gastroesophageal reflux disease) 05/21/2020   Lumbar stenosis with neurogenic  claudication 02/14/2018   Mild cognitive impairment 01/29/2015   Degenerative joint disease of cervical spine 01/29/2015   Gait difficulty 01/06/2015   Urinary urgency 01/06/2015   COPD (chronic obstructive pulmonary disease) ([place]) 05/03/2011   Osteopenia    Migraines    Hypertension 01/15/2011   Insomnia 01/15/2011   Hypothyroidism 09/17/2010   Anxiety 09/17/2010   Hyperlipidemia 09/17/2010   History of smoking 09/17/2010    Past Surgical History:  Procedure Laterality Date   BACK SURGERY  02/14/2018   lumbar 3-4  fusion   BREAST EXCISIONAL BIOPSY Left 1979   Benign    CATARACT EXTRACTION, BILATERAL     CERVICAL FUSION     CHOLECYSTECTOMY     KYPHOPLASTY  09/19/2015   Dr. Ellene Route  T12   LUMBAR [place] SURGERY  11/11/2017   L3-4   Thumb surg     THYROIDECTOMY     TONSILLECTOMY     VAGINAL HYSTERECTOMY       OB History     Gravida  2   Para  2   Term  2   Preterm      AB      Living  2      SAB      IAB      Ectopic      Multiple      Live Births              Family History  Adopted: Yes  Family history unknown: Yes    Social History   Tobacco Use   Smoking status: Every Day   Smokeless tobacco: Never   Tobacco comments:    5  cigarettes daily  Vaping Use   Vaping Use: Never used  Substance Use Topics   Alcohol use: Yes    Alcohol/week: 1.0 standard drink    Types: 1 Glasses of wine per week    Comment: Rare   Drug use: No    Home Medications Prior to Admission medications   Medication Sig Start Date End Date Taking? Authorizing Provider  Calcium-Vitamin D-Vitamin K (VIACTIV CALCIUM PLUS D PO) Take 2 tablets by mouth daily. Chews    [provider]  Cyanocobalamin (B-12) 1000 MCG SUBL Place 2,000 mcg under the tongue daily.    [provider]  cyclobenzaprine (FLEXERIL) 10 MG tablet Take 10 mg by mouth 3 (three) times daily as needed for muscle spasms.    [provider]  denosumab (PROLIA) 60 MG/ML  SOLN injection Inject 60 mg into the skin every 6 (six) months. Administer in upper arm, thigh, or abdomen    [provider]  diazepam (VALIUM) 5 MG tablet Take 2.5-5 mg by mouth daily as needed. 08/27/20   [provider]  diphenhydrAMINE (BENADRYL) 25 MG tablet Take 25-50 mg by mouth daily as needed (hives).     [provider]  estradiol (ESTRACE) 1 MG tablet Take 1 tablet (1 mg total) by mouth daily. 07/29/20   Marny Lowenstein A, NP  fenofibrate 160 MG tablet TAKE 1 TABLET BY MOUTH DAILY. Patient taking differently: Take 160 mg by mouth daily. 04/10/20   Elby Showers, MD  fexofenadine (ALLEGRA) 60 MG tablet Take 60 mg by mouth daily.    [provider]  fluticasone (FLONASE) 50 MCG/ACT nasal spray Place 1 spray into both nostrils daily.    [provider]  GEMTESA 75 MG TABS Take 1 tablet by mouth daily. 07/11/20   [provider]  HYDROmorphone (DILAUDID) 2 MG tablet Take 1 tablet (2 mg total) by mouth every 4 (four) hours as needed for severe pain. 10/14/20   Elby Showers, MD  levothyroxine (SYNTHROID) 100 MCG tablet TAKE 1 TABLET BY MOUTH DAILY. 05/15/20   Elby Showers, MD  Magnesium 250 MG TABS Take 250 mg by mouth daily.    [provider]  melatonin 5 MG TABS Take 5 mg by mouth at bedtime.    [provider]  memantine (NAMENDA) 10 MG tablet Take 1 tablet (10 mg total) by mouth 2 (two) times daily. TAKE 10 mg TABLET BY MOUTH 2 TIMES DAILY. 05/21/20   Suzzanne Cloud, NP  methylPREDNISolone (MEDROL) 4 MG tablet TAKE IN TAPERING COURSE 6,5,4,3,2,1 10/28/20   Elby Showers, MD  Multiple Vitamin (MULTIVITAMIN WITH MINERALS) TABS tablet Take 2 tablets by mouth daily. One a day women's    [provider]  pantoprazole (PROTONIX) 40 MG tablet TAKE 1 TABLET BY MOUTH DAILY. 09/10/20   Elby Showers, MD  RESTASIS 0.05 % ophthalmic emulsion Place 1 drop into both eyes 2 (two) times daily. 10/18/19   [provider]  rOPINIRole (REQUIP) 0.5 MG tablet Take 0.5 mg by mouth at bedtime. 09/11/20   [provider]  traZODone (DESYREL) 100 MG tablet Take 200 mg by mouth at bedtime.    [provider]  valACYclovir (VALTREX) 500 MG tablet One po twice daily for 5 days  Patient taking differently: Take 500 mg by mouth 2 (two) times daily as needed (Fever blister/ Shingles). for 5 days 11/20/19   Elby Showers, MD  venlafaxine XR (EFFEXOR-XR) 150 MG 24 hr capsule Take 225 mg by mouth daily with breakfast.    [provider]    Allergies    Doxycycline, Penicillins, Celebrex [celecoxib], Cephalosporins, Gabapentin, Morphine and related, and Latex  Review of Systems   Review of Systems  Constitutional:  Negative for chills and fever.  Respiratory:  Positive for shortness of breath.   Cardiovascular:  Positive for chest pain.  Gastrointestinal:  Negative for abdominal pain, nausea and vomiting.  All other systems reviewed and are negative.  Physical Exam Updated Vital Signs BP 101/71   Pulse 90   Temp 99.7 F (37.6 C) (Oral)   Resp (!) 24   SpO2 93%   Physical Exam Vitals and nursing note reviewed.  Constitutional:      General: She is not in acute distress.    Appearance: She is not ill-appearing.  HENT:     Head: Normocephalic.  Eyes:     Pupils: Pupils are equal, round, and reactive to light.  Cardiovascular:     Rate and Rhythm: Regular rhythm. Tachycardia present.     Pulses: Normal pulses.     Heart sounds: Normal heart sounds. No murmur heard.   No friction rub. No gallop.  Pulmonary:     Effort: Pulmonary effort is normal.     Breath sounds: Normal breath sounds.  Abdominal:     General: Abdomen is flat. There is no distension.     Palpations: Abdomen is soft.     Tenderness: There is no abdominal tenderness. There is no guarding or rebound.  Musculoskeletal:        General: Normal range of motion.     Cervical back: Neck supple.     Comments: No lower  extremity edema.   Skin:    General: Skin is warm and dry.  Neurological:     General: No focal deficit present.     Mental Status: She is alert.  Psychiatric:        Mood and Affect: Mood normal.        Behavior: Behavior normal.    ED Results / Procedures / Treatments   Labs (all labs ordered are listed, but only abnormal results are displayed) Labs Reviewed  BASIC METABOLIC PANEL - Abnormal; Notable for the following components:      Result Value   Sodium 131 (*)    Glucose, Bld 133 (*)    Calcium 8.1 (*)    All other components within normal limits  CBC - Abnormal; Notable for the following components:   WBC 13.1 (*)    RBC 3.61 (*)    Hemoglobin 11.1 (*)    HCT 33.8 (*)    Platelets 661 (*)    All other components within normal limits  BRAIN NATRIURETIC PEPTIDE - Abnormal; Notable for the following components:   B Natriuretic Peptide 196.4 (*)    All other components within normal limits  APTT - Abnormal; Notable for the following components:   aPTT 46 (*)    All other components within normal limits  PROTIME-INR - Abnormal; Notable for the following components:   Prothrombin Time 15.9 (*)    INR 1.3 (*)    All other components within normal limits  TROPONIN I (HIGH SENSITIVITY) - Abnormal; Notable for the following components:   Troponin I (  High Sensitivity) 578 (*)    All other components within normal limits  TROPONIN I (HIGH SENSITIVITY) - Abnormal; Notable for the following components:   Troponin I (High Sensitivity) 480 (*)    All other components within normal limits  RESP PANEL BY RT-PCR (FLU A&B, COVID) ARPGX2  HEPARIN LEVEL (UNFRACTIONATED)  CBC    EKG EKG Interpretation  Date/Time:  Tuesday October 28 2020 19:35:32 EDT Ventricular Rate:  171 PR Interval:  144 QRS Duration: 89 QT Interval:  292 QTC Calculation: 493 R Axis:   49 Text Interpretation: Atrial fibrillation with rapid V-rate Low voltage, extremity and precordial leads Repolarization  abnormality, prob rate related When compared with ECG of EARLIER SAME DATE Atrial fibrillation with rapid ventricular response has replaced Sinus rhythm Confirmed by Delora Fuel ([id number]) on 10/28/2020 11:14:19 PM  Radiology DG Chest 2 View  Result Date: 10/28/2020 CLINICAL DATA:  Shortness of breath x1 day EXAM: CHEST - 2 VIEW COMPARISON:  CT chest dated 10/28/2020 FINDINGS: Left basilar scarring. Trace left pleural effusion. No frank interstitial edema. The heart is normal in size. Cervical spine fixation hardware. Visualized thoracolumbar spine is otherwise within normal limits. IMPRESSION: Trace left pleural effusion with mild left basilar scarring. Electronically Signed   By: Julian Hy M.D.   On: 10/28/2020 19:12   CT Chest W Contrast  Result Date: 10/28/2020 CLINICAL DATA:  Follow-up abnormal chest radiograph. EXAM: CT CHEST WITH CONTRAST TECHNIQUE: Multidetector CT imaging of the chest was performed during intravenous contrast administration. CONTRAST:  3m ISOVUE-300 IOPAMIDOL (ISOVUE-300) INJECTION 61% COMPARISON:  10/15/20 FINDINGS: Cardiovascular: Aortic atherosclerosis. There is a moderate pericardial effusion with overlying enhancement of the pericardium. Heart size is normal. Mediastinum/Nodes: Thyroid gland is atrophic or surgically absent. The trachea appears patent and is midline. Normal appearance of the esophagus. Multiple small mediastinal lymph nodes are identified, none of which meet CT criteria for adenopathy. These measure up to 7 mm. No hilar adenopathy. Lungs/Pleura: Small bilateral pleural effusions. Centrilobular emphysema. No airspace consolidation, or pneumothorax. Mild dependent subsegmental atelectasis noted within the lung bases. There is also mild subsegmental atelectasis in the right middle lobe and lingula. No airspace consolidation to suggest pneumonia. Upper Abdomen: Status post cholecystectomy. Increase caliber of the CBD measures 1.4 cm proximally. There is mild  to moderate intrahepatic bile duct dilatation. The distal CBD is not included on this study. Similar appearance of septated cyst arising off the upper pole of the left kidney measuring 4.6 cm, image 142/2. Musculoskeletal: There is a treated compression fracture involving the T12 vertebral body. No acute or suspicious bone lesions identified IMPRESSION: 1. Moderate pericardial effusion with overlying enhancement of the pericardium. Findings are concerning for pericarditis. 2. Small bilateral pleural effusions. 3. Emphysema and aortic atherosclerosis. 4. There is moderate intrahepatic dilatation and dilatation of the common bile duct status post cholecystectomy. This has progressed from 12/29/2011. The mid and distal portions of the CBD and head of pancreas are not included on this study. Aortic Atherosclerosis (ICD10-I70.0) and Emphysema (ICD10-J43.9). Electronically Signed   By: TKerby MoorsM.D.   On: 10/28/2020 15:30    Procedures Procedures   Medications Ordered in ED Medications  amiodarone (NEXTERONE) 1.8 mg/mL load via infusion 150 mg (150 mg Intravenous Bolus from Bag 10/28/20 2209)    Followed by  amiodarone (NEXTERONE PREMIX) 360-4.14 MG/200ML-% (1.8 mg/mL) IV infusion (60 mg/hr Intravenous New Bag/Given 10/28/20 2209)    Followed by  amiodarone (NEXTERONE PREMIX) 360-4.14 MG/200ML-% (1.8 mg/mL) IV infusion (has no administration  in time range)  colchicine tablet 0.6 mg (0.6 mg Oral Given 10/28/20 2225)    Followed by  colchicine tablet 0.6 mg (has no administration in time range)  ibuprofen (ADVIL) tablet 600 mg (600 mg Oral Given 10/28/20 2210)  heparin ADULT infusion 100 units/mL (25000 units/238m) (650 Units/hr Intravenous New Bag/Given 10/28/20 2223)  heparin bolus via infusion 3,000 Units (3,000 Units Intravenous Bolus from Bag 10/28/20 2223)    ED Course  I have reviewed the triage vital signs and the nursing notes.  Pertinent labs & imaging results that were available during my  care of the patient were reviewed by me and considered in my medical decision making (see chart for details).  Clinical Course as of 10/28/20 2333  Tue Oct 28, 2020  1925 Troponin I (High Sensitivity)(!!): 578 [CA]  1926 WBC(!): 13.1 [CA]  2026 Discussed case with Cardiology who will see patient at bedside. [CA]    Clinical Course User Index [CA] ASuzy Bouchard PA-C   MDM Rules/Calculators/A&P                          75year old female presents to the ED due to constant chest pain x1.5 weeks associated with shortness of breath.  Chest pain worse with exertion and movement.  Patient sent to the ED from PCP due to CT chest that demonstrated moderate pericardial effusion.  No cardiac history.  Upon arrival, stable vitals.  However, during initial evaluation patient's heart rate in the 150s.  Cardiac monitor reviewed which demonstrates atrial flutter vs. A. Fib. No previous history of either. Vagal maneuver attempted with no change in rhythm. Cardiac labs ordered at triage.   Initial troponin elevated at 578.  CBC significant for leukocytosis at 13.1 and anemia with hemoglobin 11.1.  BMP significant for hyperglycemia 133 and hyponatremia 131.  No anion gap.  Chest x-ray demonstrates trace left pleural effusion.  Discussed case with Cardiology who agrees to admit patient for further treatment. Dr. CSallyanne Kusteragrees to admit.  Final Clinical Impression(s) / ED Diagnoses Final diagnoses:  Pericardial effusion    Rx / DC Orders ED Discharge Orders     None        AKarie Kirks08/30/22 2333    [place], CGwenyth Allegra MD 10/29/20 [id number]

## 2020-10-28 NOTE — Telephone Encounter (Signed)
Patient was here for labs this morning she is requesting something else for pain, she stated what she was given is not helping.

## 2020-10-28 NOTE — ED Provider Notes (Signed)
Emergency Medicine Provider Triage Evaluation Note  Megan Williams , a 75 y.o. female  was evaluated in triage.  Pt complains of sob and intermittent chest pain, had an abnormal cxr as outpt earlier this year and pcp did ct chest today which showed a pericardial effusion.  Review of Systems  Positive: Sob, chest pain Negative: fever  Physical Exam  BP (!) 116/53   Pulse 99   Temp 99.7 F (37.6 C) (Oral)   Resp 14   SpO2 94%  Gen:   Awake, no distress   Resp:  tachypneic MSK:   Moves extremities without difficulty  Other:  Tachypneic, heart with rrr, no murmur  Medical Decision Making  Medically screening exam initiated at 5:19 PM.  Appropriate orders placed.  Megan Williams was informed that the remainder of the evaluation will be completed by another provider, this initial triage assessment does not replace that evaluation, and the importance of remaining in the ED until their evaluation is complete.     Rodney Booze, PA-C 10/28/20 [place], [place], DO 10/28/20 2248

## 2020-10-28 NOTE — ED Notes (Addendum)
Dr. Blossom Hoops paged that pt will need a stepdown bed since she is on a amiodarone drip

## 2020-10-29 ENCOUNTER — Observation Stay (HOSPITAL_BASED_OUTPATIENT_CLINIC_OR_DEPARTMENT_OTHER): Payer: PPO

## 2020-10-29 ENCOUNTER — Encounter (HOSPITAL_COMMUNITY): Payer: Self-pay | Admitting: Student

## 2020-10-29 DIAGNOSIS — I3139 Other pericardial effusion (noninflammatory): Secondary | ICD-10-CM | POA: Diagnosis present

## 2020-10-29 DIAGNOSIS — I313 Pericardial effusion (noninflammatory): Secondary | ICD-10-CM | POA: Diagnosis not present

## 2020-10-29 DIAGNOSIS — I480 Paroxysmal atrial fibrillation: Secondary | ICD-10-CM | POA: Diagnosis present

## 2020-10-29 DIAGNOSIS — I309 Acute pericarditis, unspecified: Secondary | ICD-10-CM | POA: Diagnosis not present

## 2020-10-29 LAB — URINALYSIS, ROUTINE W REFLEX MICROSCOPIC
Bilirubin Urine: NEGATIVE
Glucose, UA: NEGATIVE mg/dL
Hgb urine dipstick: NEGATIVE
Ketones, ur: NEGATIVE mg/dL
Leukocytes,Ua: NEGATIVE
Nitrite: NEGATIVE
Protein, ur: NEGATIVE mg/dL
Specific Gravity, Urine: >1.046 — ABNORMAL HIGH (ref 1.005–1.030)
pH: 5 (ref 5.0–8.0)

## 2020-10-29 LAB — BASIC METABOLIC PANEL
Anion gap: 8 (ref 5–15)
BUN: 9 mg/dL (ref 8–23)
CO2: 22 mmol/L (ref 22–32)
Calcium: 8.2 mg/dL — ABNORMAL LOW (ref 8.9–10.3)
Chloride: 104 mmol/L (ref 98–111)
Creatinine, Ser: 0.79 mg/dL (ref 0.44–1.00)
GFR, Estimated: >60 mL/min (ref >60)
Glucose, Bld: 162 mg/dL — ABNORMAL HIGH (ref 70–99)
Potassium: 3.6 mmol/L (ref 3.5–5.1)
Sodium: 134 mmol/L — ABNORMAL LOW (ref 135–145)

## 2020-10-29 LAB — ECHOCARDIOGRAM COMPLETE
AR max vel: 2.01 cm2
AV Area VTI: 1.83 cm2
AV Area mean vel: 1.89 cm2
AV Mean grad: 2 mmHg
AV Peak grad: 3.6 mmHg
Ao pk vel: 0.95 m/s
Area-P 1/2: 3.42 cm2
Height: 62 in
S' Lateral: 3.2 cm
Weight: 1760 oz

## 2020-10-29 LAB — C-REACTIVE PROTEIN: CRP: 23.5 mg/dL — ABNORMAL HIGH (ref <1.0)

## 2020-10-29 LAB — HEPARIN LEVEL (UNFRACTIONATED)
Heparin Unfractionated: <0.1 IU/mL — ABNORMAL LOW (ref 0.30–0.70)
Heparin Unfractionated: <0.1 IU/mL — ABNORMAL LOW (ref 0.30–0.70)
Heparin Unfractionated: <0.1 IU/mL — ABNORMAL LOW (ref 0.30–0.70)

## 2020-10-29 LAB — CBC
HCT: 32.6 % — ABNORMAL LOW (ref 36.0–46.0)
Hemoglobin: 10.8 g/dL — ABNORMAL LOW (ref 12.0–15.0)
MCH: 30.8 pg (ref 26.0–34.0)
MCHC: 33.1 g/dL (ref 30.0–36.0)
MCV: 92.9 fL (ref 80.0–100.0)
Platelets: 543 10*3/uL — ABNORMAL HIGH (ref 150–400)
RBC: 3.51 MIL/uL — ABNORMAL LOW (ref 3.87–5.11)
RDW: 13.4 % (ref 11.5–15.5)
WBC: 11 10*3/uL — ABNORMAL HIGH (ref 4.0–10.5)
nRBC: 0 % (ref 0.0–0.2)

## 2020-10-29 LAB — GLUCOSE, CAPILLARY: Glucose-Capillary: 114 mg/dL — ABNORMAL HIGH (ref 70–99)

## 2020-10-29 LAB — SEDIMENTATION RATE: Sed Rate: 117 mm/hr — ABNORMAL HIGH (ref 0–22)

## 2020-10-29 MED ORDER — PANTOPRAZOLE SODIUM 40 MG PO TBEC
40.0000 mg | DELAYED_RELEASE_TABLET | Freq: Every day | ORAL | Status: DC
  Administered 2020-10-29 – 2020-10-30 (×2): 40 mg via ORAL
  Filled 2020-10-29 (×2): qty 1

## 2020-10-29 MED ORDER — HEPARIN BOLUS VIA INFUSION
1500.0000 [IU] | Freq: Once | INTRAVENOUS | Status: AC
  Administered 2020-10-29: 1500 [IU] via INTRAVENOUS
  Filled 2020-10-29: qty 1500

## 2020-10-29 MED ORDER — VENLAFAXINE HCL ER 75 MG PO CP24
225.0000 mg | ORAL_CAPSULE | Freq: Every day | ORAL | Status: DC
  Administered 2020-10-29: 225 mg via ORAL
  Filled 2020-10-29 (×3): qty 1

## 2020-10-29 MED ORDER — LEVOTHYROXINE SODIUM 100 MCG PO TABS
100.0000 ug | ORAL_TABLET | Freq: Every day | ORAL | Status: DC
  Administered 2020-10-29 – 2020-10-30 (×2): 100 ug via ORAL
  Filled 2020-10-29 (×2): qty 1

## 2020-10-29 MED ORDER — TRAZODONE HCL 100 MG PO TABS
200.0000 mg | ORAL_TABLET | Freq: Every day | ORAL | Status: DC
  Administered 2020-10-29 (×2): 200 mg via ORAL
  Filled 2020-10-29: qty 4
  Filled 2020-10-29: qty 2

## 2020-10-29 MED ORDER — MEMANTINE HCL 10 MG PO TABS
10.0000 mg | ORAL_TABLET | Freq: Two times a day (BID) | ORAL | Status: DC
  Administered 2020-10-29 – 2020-10-30 (×3): 10 mg via ORAL
  Filled 2020-10-29 (×5): qty 1

## 2020-10-29 MED ORDER — HEPARIN BOLUS VIA INFUSION
2000.0000 [IU] | Freq: Once | INTRAVENOUS | Status: AC
  Administered 2020-10-29: 2000 [IU] via INTRAVENOUS
  Filled 2020-10-29: qty 2000

## 2020-10-29 MED ORDER — MELATONIN 5 MG PO TABS
5.0000 mg | ORAL_TABLET | Freq: Every day | ORAL | Status: DC
  Administered 2020-10-29 (×2): 5 mg via ORAL
  Filled 2020-10-29 (×2): qty 1

## 2020-10-29 MED ORDER — FENOFIBRATE 160 MG PO TABS
160.0000 mg | ORAL_TABLET | Freq: Every day | ORAL | Status: DC
  Administered 2020-10-29 – 2020-10-30 (×2): 160 mg via ORAL
  Filled 2020-10-29 (×2): qty 1

## 2020-10-29 MED ORDER — AMIODARONE HCL 200 MG PO TABS
400.0000 mg | ORAL_TABLET | Freq: Two times a day (BID) | ORAL | Status: DC
  Administered 2020-10-29 – 2020-10-30 (×4): 400 mg via ORAL
  Filled 2020-10-29 (×4): qty 2

## 2020-10-29 MED ORDER — CYCLOSPORINE 0.05 % OP EMUL
1.0000 [drp] | Freq: Two times a day (BID) | OPHTHALMIC | Status: DC
  Administered 2020-10-29 – 2020-10-30 (×3): 1 [drp] via OPHTHALMIC
  Filled 2020-10-29 (×5): qty 1

## 2020-10-29 MED ORDER — ESTRADIOL 1 MG PO TABS
1.0000 mg | ORAL_TABLET | Freq: Every day | ORAL | Status: DC
  Administered 2020-10-29 – 2020-10-30 (×2): 1 mg via ORAL
  Filled 2020-10-29 (×2): qty 1

## 2020-10-29 MED ORDER — AMIODARONE HCL 200 MG PO TABS: 200.0000 mg | ORAL_TABLET | Freq: Every day | ORAL | Status: DC

## 2020-10-29 MED ORDER — HYDROMORPHONE HCL 1 MG/ML IJ SOLN: 0.5000 mg | INTRAMUSCULAR | Status: DC | PRN

## 2020-10-29 MED ORDER — SENNA 8.6 MG PO TABS
1.0000 | ORAL_TABLET | Freq: Two times a day (BID) | ORAL | Status: DC
  Administered 2020-10-29 (×2): 8.6 mg via ORAL
  Filled 2020-10-29 (×2): qty 1

## 2020-10-29 NOTE — Progress Notes (Signed)
ANTICOAGULATION CONSULT NOTE - Follow Up Consult  Pharmacy Consult for heparin Indication: atrial fibrillation  Labs: Recent Labs    10/28/20 1722 10/28/20 1939 10/28/20 2202 10/29/20 0507 10/29/20 1200 10/29/20 2154  HGB 11.1*  --   --  10.8*  --   --   HCT 33.8*  --   --  32.6*  --   --   PLT 661*  --   --  543*  --   --   APTT  --   --  46*  --   --   --   LABPROT  --   --  15.9*  --   --   --   INR  --   --  1.3*  --   --   --   HEPARINUNFRC  --   --   --  <0.10* <0.10* <0.10*  CREATININE 0.99  --   --  0.79  --   --   TROPONINIHS 578* 480*  --   --   --   --     Assessment: 75yo female remains subtherapeutic on heparin after rate changes; no infusion issues or signs of bleeding per RN.  Goal of Therapy:  Heparin level 0.3-0.7 units/ml   Plan:  Will rebolus with heparin 2000 units and increase heparin infusion by 4 units/kg/hr to 1250 units/hr and check level in 8 hours.    Wynona Neat, PharmD, BCPS  10/29/2020,11:05 PM

## 2020-10-29 NOTE — H&P (Addendum)
Cardiology Admission History and Physical:   Patient ID: Megan Williams MRN: [id number]; DOB: 1945/08/26   Admission date: 10/28/2020  PCP:  Elby Showers, MD   [facility] Providers Cardiologist:  None       Chief Complaint:   Chest pain Shortness of breath Pericardial effusion  Patient Profile:   Megan Williams is a 75 y.o. female with PMhx significant for hypertension, hypothyroidism, generalized anxiety disorder, degenerative disk disease with chronic pain who is being seen 10/29/2020 for the evaluation of new pericardial effusion and chest pain consistent with pericarditis.  History of Present Illness:   Megan Williams is a 67 yea rold female with past medical history as above who was sent for CT scan today and told to present to the ED after discovery of a pericardial effusion.  She has been having worsening chest pain for the last two weeks with the development of worsening shortness of breath with exertion and dyspnea at rest.  She was initially started on hydromorphone about a week ago and her PCP sent her for a CT scan and started on steroid burst given intractable chest pain which she thought might be due to musculoskeletal disease (?monoclonal gammopathy).  She was found to have a pericardial effusion and sent to ED for further evaluation.  Chest pain is positional, worse with deep respiration and lying flat.  She is splinting to minimize the pain.  No recent URI, no travel to TB endemic locations, high risk exposures, history of CTD.  She has been watching her blood pressure and heart rate which have been normal and also wears a fit bit and hasn't noticed high heart rates during this period.  She developed AF with RVR after presentation here with rates to the 150s on my evaluation.  She has had some subjective fevers and chills during this period as well but has been afebrile here.  Past Medical History:  Diagnosis Date   Allergy    Anxiety    Arthritis    Complication of  anesthesia    "difficulty waking up. I could hear them but I couldn't wake up"   COPD (chronic obstructive pulmonary disease) (HCC)    Depression    Emphysema    Esophagitis    Herpes simplex    Hypertension    Hypothyroidism    Migraines    Osteopenia    Vitamin D deficiency     Past Surgical History:  Procedure Laterality Date   BACK SURGERY  02/14/2018   lumbar 3-4  fusion   BREAST EXCISIONAL BIOPSY Left 1979   Benign    CATARACT EXTRACTION, BILATERAL     CERVICAL FUSION     CHOLECYSTECTOMY     KYPHOPLASTY  09/19/2015   Dr. Ellene Route  T12   LUMBAR [place] SURGERY  11/11/2017   L3-4   Thumb surg     THYROIDECTOMY     TONSILLECTOMY     VAGINAL HYSTERECTOMY       Medications Prior to Admission: Prior to Admission medications   Medication Sig Start Date End Date Taking? Authorizing Provider  estradiol (ESTRACE) 1 MG tablet Take 1 tablet (1 mg total) by mouth daily. 07/29/20  Yes Marny Lowenstein A, NP  fenofibrate 160 MG tablet TAKE 1 TABLET BY MOUTH DAILY. Patient taking differently: Take 160 mg by mouth daily. 04/10/20  Yes Baxley, Cresenciano Lick, MD  HYDROmorphone (DILAUDID) 2 MG tablet Take 1 tablet (2 mg total) by mouth every 4 (four) hours as needed for  severe pain. 10/14/20  Yes Baxley, Cresenciano Lick, MD  levothyroxine (SYNTHROID) 100 MCG tablet TAKE 1 TABLET BY MOUTH DAILY. 05/15/20  Yes Baxley, Cresenciano Lick, MD  memantine (NAMENDA) 10 MG tablet Take 1 tablet (10 mg total) by mouth 2 (two) times daily. TAKE 10 mg TABLET BY MOUTH 2 TIMES DAILY. 05/21/20  Yes Suzzanne Cloud, NP  methylPREDNISolone (MEDROL) 4 MG tablet TAKE IN TAPERING COURSE 6,5,4,3,2,1 10/28/20  Yes Elby Showers, MD  pantoprazole (PROTONIX) 40 MG tablet TAKE 1 TABLET BY MOUTH DAILY. 09/10/20  Yes Baxley, Cresenciano Lick, MD  traZODone (DESYREL) 100 MG tablet Take 200 mg by mouth at bedtime.   Yes [provider]  venlafaxine XR (EFFEXOR-XR) 150 MG 24 hr capsule Take 225 mg by mouth daily with breakfast.   Yes [provider]  Calcium-Vitamin D-Vitamin K (VIACTIV CALCIUM PLUS D PO) Take 2 tablets by mouth daily. Chews    [provider]  Cyanocobalamin (B-12) 1000 MCG SUBL Place 2,000 mcg under the tongue daily.    [provider]  cyclobenzaprine (FLEXERIL) 10 MG tablet Take 10 mg by mouth 3 (three) times daily as needed for muscle spasms.    [provider]  denosumab (PROLIA) 60 MG/ML SOLN injection Inject 60 mg into the skin every 6 (six) months. Administer in upper arm, thigh, or abdomen    [provider]  diazepam (VALIUM) 5 MG tablet Take 2.5-5 mg by mouth daily as needed. 08/27/20   [provider]  diphenhydrAMINE (BENADRYL) 25 MG tablet Take 25-50 mg by mouth daily as needed (hives).     [provider]  fexofenadine (ALLEGRA) 60 MG tablet Take 60 mg by mouth daily.    [provider]  fluticasone (FLONASE) 50 MCG/ACT nasal spray Place 1 spray into both nostrils daily.    [provider]  GEMTESA 75 MG TABS Take 1 tablet by mouth daily. 07/11/20   [provider]  Magnesium 250 MG TABS Take 250 mg by mouth daily.    [provider]  melatonin 5 MG TABS Take 5 mg by mouth at bedtime.    [provider]  Multiple Vitamin (MULTIVITAMIN WITH MINERALS) TABS tablet Take 2 tablets by mouth daily. One a day women's    [provider]  RESTASIS 0.05 % ophthalmic emulsion Place 1 drop into both eyes 2 (two) times daily. 10/18/19   [provider]  rOPINIRole (REQUIP) 0.5 MG tablet Take 0.5 mg by mouth at bedtime. 09/11/20   [provider]  valACYclovir (VALTREX) 500 MG tablet One po twice daily for 5 days Patient taking differently: Take 500 mg by mouth 2 (two) times daily as needed (Fever blister/ Shingles). for 5 days 11/20/19   Elby Showers, MD     Allergies:    Allergies  Allergen Reactions   Doxycycline Other (See Comments)    Skin peeling    Penicillins Hives    Has patient had a  PCN reaction causing immediate rash, facial/tongue/throat swelling, SOB or lightheadedness with hypotension: No Has patient had a PCN reaction causing severe rash involving mucus membranes or skin necrosis: No Has patient had a PCN reaction that required hospitalization: No Has patient had a PCN reaction occurring within the last 10 years: No If all of the above answers are "NO", then may proceed with Cephalosporin use.   Celebrex [Celecoxib] Other (See Comments)    Blistering, pain, swelling   Cephalosporins Nausea Only    Diarrhea,  hives   Gabapentin     Causes TICS    Morphine And Related Itching   Latex Rash    Social History:   Social History   Socioeconomic History   Marital status: Married    Spouse name: Not on file   Number of children: 2   Years of education: 14   Highest education level: Not on file  Occupational History   Occupation: Retired  Tobacco Use   Smoking status: Every Day   Smokeless tobacco: Never   Tobacco comments:    5  cigarettes daily  Vaping Use   Vaping Use: Never used  Substance and Sexual Activity   Alcohol use: Yes    Alcohol/week: 1.0 standard drink    Types: 1 Glasses of wine per week    Comment: Rare   Drug use: No   Sexual activity: Not Currently    Birth control/protection: Surgical    Comment: INTERCOURSE AGE 78, SEXUAL PARTNERS LESS THAN 5  Other Topics Concern   Not on file  Social History Narrative   Lives at home with husband.   Right-handed.   1 cup caffeine daily.   Social Determinants of Health   Financial Resource Strain: Not on file  Food Insecurity: Not on file  Transportation Needs: Not on file  Physical Activity: Not on file  Stress: Not on file  Social Connections: Not on file  Intimate Partner Violence: Not on file    Family History:   The patient's She was adopted. Family history is unknown by patient.    ROS:  Please see the history of present illness.  All other ROS reviewed and negative.      Physical Exam/Data:   Vitals:   10/28/20 2300 10/28/20 2315 10/28/20 2336 10/29/20 0030  BP: 101/71   119/89  Pulse: 89 90  86  Resp: (!) 27 (!) 24  18  Temp:   98.5 F (36.9 C)   TempSrc:   Oral   SpO2: 95% 93%  100%   No intake or output data in the 24 hours ending 10/29/20 0052 Last 3 Weights 10/27/2020 10/21/2020 10/14/2020  Weight (lbs) 117 lb 119 lb 120 lb  Weight (kg) 53.071 kg 53.978 kg 54.432 kg     There is no height or weight on file to calculate BMI.  General:  Well nourished, well developed, in no acute distress HEENT: normal Lymph: no adenopathy Neck: Kussmaul's sign Endocrine:  No thryomegaly Vascular: No carotid bruits; FA pulses 2+ bilaterally without bruits  Cardiac:  irregularly irregular, tachycardic, no friction rub, no murmur appreciated Lungs:  clear to auscultation bilaterally, no wheezing, rhonchi or rales  Abd: soft, nontender, no hepatomegaly  Ext: no edema Musculoskeletal:  No deformities, BUE and BLE strength normal and equal Skin: warm and dry  Neuro:  CNs 2-12 intact, no focal abnormalities noted Psych:  Normal affect   EKG:  AF RVR, normal axis, no significant ST deviation  Relevant CV Studies: None prior  Laboratory Data:  High Sensitivity Troponin:   Recent Labs  Lab 10/28/20 1722 10/28/20 1939  TROPONINIHS 578* 480*      Chemistry Recent Labs  Lab 10/28/20 1722  NA 131*  K 3.5  CL 98  CO2 22  GLUCOSE 133*  BUN 8  CREATININE 0.99  CALCIUM 8.1*  GFRNONAA >60  ANIONGAP 11    No results for input(s): PROT, ALBUMIN, AST, ALT, ALKPHOS, BILITOT in the last 168 hours. Hematology Recent Labs  Lab 10/28/20 1722  WBC 13.1*  RBC 3.61*  HGB 11.1*  HCT 33.8*  MCV 93.6  MCH 30.7  MCHC 32.8  RDW 13.4  PLT 661*   BNP Recent Labs  Lab 10/28/20 1939  BNP 196.4*    DDimer No results for input(s): DDIMER in the last 168 hours.   Radiology/Studies:  DG Chest 2 View  Result Date: 10/28/2020 CLINICAL DATA:  Shortness  of breath x1 day EXAM: CHEST - 2 VIEW COMPARISON:  CT chest dated 10/28/2020 FINDINGS: Left basilar scarring. Trace left pleural effusion. No frank interstitial edema. The heart is normal in size. Cervical spine fixation hardware. Visualized thoracolumbar spine is otherwise within normal limits. IMPRESSION: Trace left pleural effusion with mild left basilar scarring. Electronically Signed   By: Julian Hy M.D.   On: 10/28/2020 19:12   CT Chest W Contrast  Result Date: 10/28/2020 CLINICAL DATA:  Follow-up abnormal chest radiograph. EXAM: CT CHEST WITH CONTRAST TECHNIQUE: Multidetector CT imaging of the chest was performed during intravenous contrast administration. CONTRAST:  28mL ISOVUE-300 IOPAMIDOL (ISOVUE-300) INJECTION 61% COMPARISON:  10/15/20 FINDINGS: Cardiovascular: Aortic atherosclerosis. There is a moderate pericardial effusion with overlying enhancement of the pericardium. Heart size is normal. Mediastinum/Nodes: Thyroid gland is atrophic or surgically absent. The trachea appears patent and is midline. Normal appearance of the esophagus. Multiple small mediastinal lymph nodes are identified, none of which meet CT criteria for adenopathy. These measure up to 7 mm. No hilar adenopathy. Lungs/Pleura: Small bilateral pleural effusions. Centrilobular emphysema. No airspace consolidation, or pneumothorax. Mild dependent subsegmental atelectasis noted within the lung bases. There is also mild subsegmental atelectasis in the right middle lobe and lingula. No airspace consolidation to suggest pneumonia. Upper Abdomen: Status post cholecystectomy. Increase caliber of the CBD measures 1.4 cm proximally. There is mild to moderate intrahepatic bile duct dilatation. The distal CBD is not included on this study. Similar appearance of septated cyst arising off the upper pole of the left kidney measuring 4.6 cm, image 142/2. Musculoskeletal: There is a treated compression fracture involving the T12 vertebral  body. No acute or suspicious bone lesions identified IMPRESSION: 1. Moderate pericardial effusion with overlying enhancement of the pericardium. Findings are concerning for pericarditis. 2. Small bilateral pleural effusions. 3. Emphysema and aortic atherosclerosis. 4. There is moderate intrahepatic dilatation and dilatation of the common bile duct status post cholecystectomy. This has progressed from 12/29/2011. The mid and distal portions of the CBD and head of pancreas are not included on this study. Aortic Atherosclerosis (ICD10-I70.0) and Emphysema (ICD10-J43.9). Electronically Signed   By: Kerby Moors M.D.   On: 10/28/2020 15:30     Assessment and Plan:   75 year old female with a history of anxiety disorder, chronic low back pain with degenerative disk disease, HTN, hypothyroidism presenting with pericardial effusion and chest pain consistent with acute pericarditis.  I suspect idiopathic pericarditis without any revealing history.  Duration of atrial fibrillation was quite short so started on amiodarone infusion with heparin running now with subsequent conversion to sinus rhythm.  Will plan to start oral load during acute inflammation that can be stopped once she's clinically improving, but will need 3 weeks anticoagulation following this chemical cardioversion at least.  JVP is elevated with inspiration but hemodynamics are stable with wide pulse pressure once in sinus so can avoid urgent catheter drainage.  #Myopericarditis, presume idiopathic - Colchicine 0.6 mg BID followed by 0.6 mg daily - Start ibuprofen 600 mg TID - ESR, CRP for baseline  #Troponin elevation; suspect due to  component of myopericarditis - Trend troponin to peak - If low LVEF, may benefit from cMRI to better characterize.  #AF w/ RVR, pAF, C2V 3 (age, HTN, sex).  Suspect low burden occurring in the context of the pericarditis. - Heparin infusion - Oral amio load, duration TBD - Will need three weeks  anticoagulation from this date at least.  Consider outpatient monitor at discharge to determine necessary duration.  #Depression/anxiety - Conitnue home venlafaxine, #Insominia - Continue home trazodone, melatonin Hypothyroidism (Iatrogenic) - TSH appropriate; continue home synthroid 100 mcg   Risk Assessment/Risk Scores:         CHA2DS2-VASc Score = 3  This indicates a 3.2% annual risk of stroke. The patient's score is based upon: CHF History: No HTN History: Yes Diabetes History: No Stroke History: No Vascular Disease History: No Age Score: 1 Gender Score: 1   Severity of Illness: The appropriate patient status for this patient is OBSERVATION. Observation status is judged to be reasonable and necessary in order to provide the required intensity of service to ensure the patient's safety. The patient's presenting symptoms, physical exam findings, and initial radiographic and laboratory data in the context of their medical condition is felt to place them at decreased risk for further clinical deterioration. Furthermore, it is anticipated that the patient will be medically stable for discharge from the hospital within 2 midnights of admission. The following factors` support the patient status of observation.   " The patient's presenting symptoms include chest pain, shortness of breath. " The physical exam findings include elevated JVP, tachycardia, tachypnea. " The initial radiographic and laboratory data are consistnet with acute pericarditis and AF w/ RVR.   For questions or updates, please contact [place] Please consult [email or web address] for contact info under     Signed, Delight Hoh, MD  10/29/2020 12:52 AM   Personally seen and examined. Agree with Fellow above with the following comments: Briefly 75 yo F with history of HTN, Hypothyroidism, GAD, Chronic pain and degenerative disc disease, longstanding chronic tobacco use with concerns for COPD who presented with  pleuritic chest pain and asymptomatic AF RVR. Patient notes no prodrome prior to her chest tightness pain ~ 9 days ago.  Notes not improvement with prescribed narcotics but improved but did not resolve with a steroid burst.  CT scan was obtained for her pain and found to have pericardial effusion. Exam notable for no friction rub.  Regular rate and rhythm.  Expiratory wheezes, nocturnal stats were 88%. Labs notable for CRP 23.5; Sed Rate 117; troponin peak 578-> 480  Personally reviewed relevant tests; ECG showed AF RVR rate 171 no clear PR depression.  Telemetry shows conversion to SR at ~ 2200. CT Chest shows aortic atherosclerosis, Emphesematous changes, and would be consistent with circumferential mild to moderate transudate (Hounsfield Units -20 - 20) effusion. Echo is pending Would recommend  Pericarditis vs Myopericarditis - pending echo; started on colchicine and Ibuprofen 600 TID can increase to 800 mg if worsening pain; if no WMA will convert heparin to DOAC; low threshold for CMR if unclear cardiac involvement; we have discussed limited activity at discharge (3-6 months) - heparin until echo as above; will continue amiodarone oral load for 10 days with 200 mg PO daily transition; continue levothyroxine; long term given emphesema and hypothyroidism, may need different tx startegy; this AF may be reactive to her pericarditis - unclear why on memantine, will continue - Carb consistent diet placed - lipids for 10/30/20 given aortic atherosclerosis; only on  fenofibrate  Full code DVT PPX- Heparin Cardiac Diet Lab orders placed for 10/30/20  Rudean Haskell, MD [facility]  [address] [place], [place] [address] [phone]  8:47 AM

## 2020-10-29 NOTE — ED Notes (Signed)
Pharmacy notified heparin level back at this time. Pending orders

## 2020-10-29 NOTE — Progress Notes (Signed)
[place] for heparin Indication: atrial fibrillation  Allergies  Allergen Reactions   Doxycycline Other (See Comments)    Skin peeling    Penicillins Hives    Has patient had a PCN reaction causing immediate rash, facial/tongue/throat swelling, SOB or lightheadedness with hypotension: No Has patient had a PCN reaction causing severe rash involving mucus membranes or skin necrosis: No Has patient had a PCN reaction that required hospitalization: No Has patient had a PCN reaction occurring within the last 10 years: No If all of the above answers are "NO", then may proceed with Cephalosporin use.   Celebrex [Celecoxib] Other (See Comments)    Blistering, pain, swelling   Cephalosporins Nausea Only    Diarrhea, hives   Gabapentin     Causes TICS    Morphine And Related Itching   Latex Rash    Patient Measurements: Height: '5\' 2"'$  (157.5 cm) Weight: 49.9 kg (110 lb) IBW/kg (Calculated) : 50.1 Heparin Dosing Weight: 53.1 kg  Vital Signs: Temp: 98.5 F (36.9 C) (08/30 2336) Temp Source: Oral (08/30 2336) BP: 104/57 (08/31 0405) Pulse Rate: 72 (08/31 0405)  Labs: Recent Labs    10/28/20 1722 10/28/20 1939 10/28/20 2202 10/29/20 0507  HGB 11.1*  --   --  10.8*  HCT 33.8*  --   --  32.6*  PLT 661*  --   --  543*  APTT  --   --  46*  --   LABPROT  --   --  15.9*  --   INR  --   --  1.3*  --   HEPARINUNFRC  --   --   --  <0.10*  CREATININE 0.99  --   --   --   TROPONINIHS 578* 480*  --   --      Estimated Creatinine Clearance: 39.9 mL/min (by C-G formula based on SCr of 0.99 mg/dL).  Assessment:  75 y.o. female with new onset Afib for heparin  Goal of Therapy:  Heparin level 0.3-0.7 units/ml Monitor platelets by anticoagulation protocol: Yes   Plan:  Heparin 1500 units IV bolus, then increase heparin  850 units/hr  Phillis Knack, PharmD, BCPS

## 2020-10-29 NOTE — Progress Notes (Signed)
[place] for heparin Indication: atrial fibrillation  Allergies  Allergen Reactions   Doxycycline Other (See Comments)    Skin peeling    Penicillins Hives    Has patient had a PCN reaction causing immediate rash, facial/tongue/throat swelling, SOB or lightheadedness with hypotension: No Has patient had a PCN reaction causing severe rash involving mucus membranes or skin necrosis: No Has patient had a PCN reaction that required hospitalization: No Has patient had a PCN reaction occurring within the last 10 years: No If all of the above answers are "NO", then may proceed with Cephalosporin use.   Celebrex [Celecoxib] Other (See Comments)    Blistering, pain, swelling   Cephalosporins Nausea Only    Diarrhea, hives   Gabapentin     Causes TICS    Morphine And Related Itching   Latex Rash    Patient Measurements: Height: '5\' 2"'$  (157.5 cm) Weight: 49.9 kg (110 lb) IBW/kg (Calculated) : 50.1 Heparin Dosing Weight: 53.1 kg  Vital Signs: BP: 99/63 (08/31 1108) Pulse Rate: 80 (08/31 1108)  Labs: Recent Labs    10/28/20 1722 10/28/20 1939 10/28/20 2202 10/29/20 0507 10/29/20 1200  HGB 11.1*  --   --  10.8*  --   HCT 33.8*  --   --  32.6*  --   PLT 661*  --   --  543*  --   APTT  --   --  46*  --   --   LABPROT  --   --  15.9*  --   --   INR  --   --  1.3*  --   --   HEPARINUNFRC  --   --   --  <0.10* <0.10*  CREATININE 0.99  --   --  0.79  --   TROPONINIHS 578* 480*  --   --   --      Estimated Creatinine Clearance: 49.3 mL/min (by C-G formula based on SCr of 0.79 mg/dL).  Assessment:  75 y.o. female with new onset Afib on IV heparin at 850 units/hr. Repeat HL remains undetectable. No s/s of bleeding or issues with infusion per RN. H/H down, Plt high   Goal of Therapy:  Heparin level 0.3-0.7 units/ml Monitor platelets by anticoagulation protocol: Yes   Plan:  Heparin 1500 units IV bolus, then increase heparin  1050  units/hr F/u 8 hr HL Monitor daiily HL, CBC and s/s of bleeding   Albertina Parr, PharmD., BCPS, BCCCP Clinical Pharmacist Please refer to [facility] for unit-specific pharmacist

## 2020-10-30 ENCOUNTER — Other Ambulatory Visit: Payer: Self-pay | Admitting: Internal Medicine

## 2020-10-30 ENCOUNTER — Other Ambulatory Visit (HOSPITAL_COMMUNITY): Payer: Self-pay

## 2020-10-30 ENCOUNTER — Telehealth: Payer: Self-pay

## 2020-10-30 DIAGNOSIS — I309 Acute pericarditis, unspecified: Secondary | ICD-10-CM | POA: Diagnosis not present

## 2020-10-30 LAB — CBC
HCT: 30.7 % — ABNORMAL LOW (ref 36.0–46.0)
Hemoglobin: 10.1 g/dL — ABNORMAL LOW (ref 12.0–15.0)
MCH: 30.7 pg (ref 26.0–34.0)
MCHC: 32.9 g/dL (ref 30.0–36.0)
MCV: 93.3 fL (ref 80.0–100.0)
Platelets: 627 10*3/uL — ABNORMAL HIGH (ref 150–400)
RBC: 3.29 MIL/uL — ABNORMAL LOW (ref 3.87–5.11)
RDW: 13.4 % (ref 11.5–15.5)
WBC: 8.1 10*3/uL (ref 4.0–10.5)
nRBC: 0 % (ref 0.0–0.2)

## 2020-10-30 LAB — LIPID PANEL
Cholesterol: 146 mg/dL (ref 0–200)
HDL: 35 mg/dL — ABNORMAL LOW (ref >40)
LDL Cholesterol: 82 mg/dL (ref 0–99)
Total CHOL/HDL Ratio: 4.2 RATIO
Triglycerides: 147 mg/dL (ref <150)
VLDL: 29 mg/dL (ref 0–40)

## 2020-10-30 LAB — BASIC METABOLIC PANEL
Anion gap: 5 (ref 5–15)
BUN: 11 mg/dL (ref 8–23)
CO2: 25 mmol/L (ref 22–32)
Calcium: 8.4 mg/dL — ABNORMAL LOW (ref 8.9–10.3)
Chloride: 106 mmol/L (ref 98–111)
Creatinine, Ser: 0.98 mg/dL (ref 0.44–1.00)
GFR, Estimated: >60 mL/min (ref >60)
Glucose, Bld: 92 mg/dL (ref 70–99)
Potassium: 4 mmol/L (ref 3.5–5.1)
Sodium: 136 mmol/L (ref 135–145)

## 2020-10-30 LAB — HEPARIN LEVEL (UNFRACTIONATED): Heparin Unfractionated: <0.1 IU/mL — ABNORMAL LOW (ref 0.30–0.70)

## 2020-10-30 MED ORDER — APIXABAN 5 MG PO TABS
5.0000 mg | ORAL_TABLET | Freq: Two times a day (BID) | ORAL | Status: DC
  Administered 2020-10-30: 5 mg via ORAL
  Filled 2020-10-30: qty 1

## 2020-10-30 MED ORDER — COLCHICINE 0.6 MG PO TABS: 0.6000 mg | ORAL_TABLET | Freq: Every day | ORAL | 1 refills | Status: DC

## 2020-10-30 MED ORDER — VENLAFAXINE HCL ER 150 MG PO CP24: 300.0000 mg | ORAL_CAPSULE | Freq: Every day | ORAL | Status: DC

## 2020-10-30 MED ORDER — VENLAFAXINE HCL ER 150 MG PO CP24
300.0000 mg | ORAL_CAPSULE | Freq: Every day | ORAL | Status: DC
  Administered 2020-10-30: 300 mg via ORAL
  Filled 2020-10-30: qty 2

## 2020-10-30 MED ORDER — AMIODARONE HCL 400 MG PO TABS: 400.0000 mg | ORAL_TABLET | Freq: Two times a day (BID) | ORAL | 0 refills | Status: DC

## 2020-10-30 MED ORDER — IBUPROFEN 600 MG PO TABS: 600.0000 mg | ORAL_TABLET | Freq: Three times a day (TID) | ORAL | 0 refills | Status: DC

## 2020-10-30 MED ORDER — AMIODARONE HCL 200 MG PO TABS: 200.0000 mg | ORAL_TABLET | Freq: Every day | ORAL | 0 refills | Status: DC

## 2020-10-30 NOTE — Progress Notes (Signed)
Discharge instructions (including medications) discussed with and copy provided to patient/caregiver 

## 2020-10-30 NOTE — Plan of Care (Signed)
  Problem: Education: Goal: Knowledge of General Education information will improve Description: Including pain rating scale, medication(s)/side effects and non-pharmacologic comfort measures Outcome: Adequate for Discharge   

## 2020-10-30 NOTE — Progress Notes (Addendum)
Progress Note  Patient Name: Megan Williams Date of Encounter: 10/30/2020  Primary Cardiologist: Dr. Gasper Sells, MD   Subjective   Chest pain improved, no SOB   Inpatient Medications    Scheduled Meds:  amiodarone  400 mg Oral BID   Followed by   Derrill Memo ON 11/08/2020] amiodarone  200 mg Oral Daily   colchicine  0.6 mg Oral Daily   cycloSPORINE  1 drop Both Eyes BID   estradiol  1 mg Oral Daily   fenofibrate  160 mg Oral Daily   ibuprofen  600 mg Oral TID   levothyroxine  100 mcg Oral Daily   melatonin  5 mg Oral QHS   memantine  10 mg Oral BID   pantoprazole  40 mg Oral Daily   senna  1 tablet Oral BID   traZODone  200 mg Oral QHS   venlafaxine XR  225 mg Oral Q breakfast   Continuous Infusions:  heparin 1,250 Units/hr (10/29/20 2308)   PRN Meds: HYDROmorphone (DILAUDID) injection   Vital Signs    Vitals:   10/29/20 2038 10/30/20 0038 10/30/20 0441 10/30/20 0742  BP: 122/63 (!) 104/59 (!) 118/56 (!) 92/57  Pulse: 79 76 77 74  Resp: _0 Temp: 98.2 F (36.8 C) 98.5 F (36.9 C) 98.3 F (36.8 C) 98.2 F (36.8 C)  TempSrc: Oral Oral Oral Oral  SpO2: 94% 98% 95% 98%  Weight:      Height:        Intake/Output Summary (Last 24 hours) at 10/30/2020 0808 Last data filed at 10/30/2020 0046 Gross per 24 hour  Intake 730.38 ml  Output 3 ml  Net 727.38 ml   Filed Weights   10/29/20 0203 10/29/20 1544  Weight: 49.9 kg 54.6 kg    Physical Exam   General: Well developed, well nourished, NAD Neck: Negative for carotid bruits. No JVD Lungs:Clear to ausculation bilaterally. No wheezes, rales, or rhonchi. Breathing is unlabored. Cardiovascular: RRR with S1 S2. No murmurs Abdomen: Soft, non-tender, non-distended. No obvious abdominal masses. Extremities: No edema. Radial pulses 2+ bilaterally Neuro: Alert and oriented. No focal deficits. No facial asymmetry. MAE spontaneously. Psych: Responds to questions appropriately with normal affect.    Labs     Chemistry Recent Labs  Lab 10/28/20 1722 10/29/20 0507  NA 131* 134*  K 3.5 3.6  CL 98 104  CO2 22 22  GLUCOSE 133* 162*  BUN 8 9  CREATININE 0.99 0.79  CALCIUM 8.1* 8.2*  GFRNONAA >60 >60  ANIONGAP 11 8     Hematology Recent Labs  Lab 10/28/20 1722 10/29/20 0507  WBC 13.1* 11.0*  RBC 3.61* 3.51*  HGB 11.1* 10.8*  HCT 33.8* 32.6*  MCV 93.6 92.9  MCH 30.7 30.8  MCHC 32.8 33.1  RDW 13.4 13.4  PLT 661* 543*    Cardiac EnzymesNo results for input(s): TROPONINI in the last 168 hours. No results for input(s): TROPIPOC in the last 168 hours.   BNP Recent Labs  Lab 10/28/20 1939  BNP 196.4*     DDimer No results for input(s): DDIMER in the last 168 hours.   Radiology    DG Chest 2 View  Result Date: 10/28/2020 CLINICAL DATA:  Shortness of breath x1 day EXAM: CHEST - 2 VIEW COMPARISON:  CT chest dated 10/28/2020 FINDINGS: Left basilar scarring. Trace left pleural effusion. No frank interstitial edema. The heart is normal in size. Cervical spine fixation hardware. Visualized thoracolumbar spine is otherwise within  normal limits. IMPRESSION: Trace left pleural effusion with mild left basilar scarring. Electronically Signed   By: Julian Hy M.D.   On: 10/28/2020 19:12   CT Chest W Contrast  Result Date: 10/28/2020 CLINICAL DATA:  Follow-up abnormal chest radiograph. EXAM: CT CHEST WITH CONTRAST TECHNIQUE: Multidetector CT imaging of the chest was performed during intravenous contrast administration. CONTRAST:  87m ISOVUE-300 IOPAMIDOL (ISOVUE-300) INJECTION 61% COMPARISON:  10/15/20 FINDINGS: Cardiovascular: Aortic atherosclerosis. There is a moderate pericardial effusion with overlying enhancement of the pericardium. Heart size is normal. Mediastinum/Nodes: Thyroid gland is atrophic or surgically absent. The trachea appears patent and is midline. Normal appearance of the esophagus. Multiple small mediastinal lymph nodes are identified, none of which meet CT criteria  for adenopathy. These measure up to 7 mm. No hilar adenopathy. Lungs/Pleura: Small bilateral pleural effusions. Centrilobular emphysema. No airspace consolidation, or pneumothorax. Mild dependent subsegmental atelectasis noted within the lung bases. There is also mild subsegmental atelectasis in the right middle lobe and lingula. No airspace consolidation to suggest pneumonia. Upper Abdomen: Status post cholecystectomy. Increase caliber of the CBD measures 1.4 cm proximally. There is mild to moderate intrahepatic bile duct dilatation. The distal CBD is not included on this study. Similar appearance of septated cyst arising off the upper pole of the left kidney measuring 4.6 cm, image 142/2. Musculoskeletal: There is a treated compression fracture involving the T12 vertebral body. No acute or suspicious bone lesions identified IMPRESSION: 1. Moderate pericardial effusion with overlying enhancement of the pericardium. Findings are concerning for pericarditis. 2. Small bilateral pleural effusions. 3. Emphysema and aortic atherosclerosis. 4. There is moderate intrahepatic dilatation and dilatation of the common bile duct status post cholecystectomy. This has progressed from 12/29/2011. The mid and distal portions of the CBD and head of pancreas are not included on this study. Aortic Atherosclerosis (ICD10-I70.0) and Emphysema (ICD10-J43.9). Electronically Signed   By: TKerby MoorsM.D.   On: 10/28/2020 15:30   ECHOCARDIOGRAM COMPLETE  Result Date: 10/29/2020    ECHOCARDIOGRAM REPORT   Patient Name:   Megan ONOFRIODate of Exam: 10/29/2020 Medical Rec #:  [id number]    Height:       62.0 in Accession #:    [id number]   Weight:       110.0 lb Date of Birth:  [birth year]    BSA:          1.483 m Patient Age:    89years      BP:           96/53 mmHg Patient Gender: F             HR:           72 bpm. Exam Location:  Inpatient Procedure: 2D Echo, Cardiac Doppler and Color Doppler Indications:    Pericardial effusion  I31.3  History:        Patient has no prior history of Echocardiogram examinations.  Sonographer:    RMerrie RoofRDCS Referring Phys: [id number] 1. Left ventricular ejection fraction, by estimation, is 55 to 60%. The left ventricle has normal function. The left ventricle has no regional wall motion abnormalities. Left ventricular diastolic parameters were normal.  2. Right ventricular systolic function is normal. The right ventricular size is normal.  3. Mitral inflow velocity varies ~30% with respiration. However, IVC collapses, which is not consistent with tamponade. Image quality is limited, but there is no clear RA/RV collapse. Suggests overall no tamponade,  but recommend clinical correlation. Largest measured diameter 0.82 cm posterior to LV. a small pericardial effusion is present.  4. The mitral valve is normal in structure. Trivial mitral valve regurgitation. No evidence of mitral stenosis.  5. The aortic valve is tricuspid. There is mild calcification of the aortic valve. Aortic valve regurgitation is trivial. Mild aortic valve sclerosis is present, with no evidence of aortic valve stenosis.  6. The inferior vena cava is normal in size with greater than 50% respiratory variability, suggesting right atrial pressure of 3 mmHg. Comparison(s): No prior Echocardiogram. Conclusion(s)/Recommendation(s): Small pericardial effusion. Echo does not suggest tamponade, but recommend clinical correlation. FINDINGS  Left Ventricle: Left ventricular ejection fraction, by estimation, is 55 to 60%. The left ventricle has normal function. The left ventricle has no regional wall motion abnormalities. The left ventricular internal cavity size was normal in size. There is  borderline left ventricular hypertrophy. Left ventricular diastolic parameters were normal. Right Ventricle: The right ventricular size is normal. No increase in right ventricular wall thickness. Right ventricular systolic function is  normal. Left Atrium: Left atrial size was normal in size. Right Atrium: Right atrial size was normal in size. Pericardium: Mitral inflow velocity varies ~30% with respiration. However, IVC collapses, which is not consistent with tamponade. Image quality is limited, but there is no clear RA/RV collapse. Suggests overall no tamponade, but recommend clinical correlation. Largest measured diameter 0.82 cm posterior to LV. A small pericardial effusion is present. Mitral Valve: The mitral valve is normal in structure. There is mild thickening of the mitral valve leaflet(s). There is mild calcification of the mitral valve leaflet(s). Trivial mitral valve regurgitation. No evidence of mitral valve stenosis. Tricuspid Valve: The tricuspid valve is normal in structure. Tricuspid valve regurgitation is trivial. No evidence of tricuspid stenosis. Aortic Valve: The aortic valve is tricuspid. There is mild calcification of the aortic valve. Aortic valve regurgitation is trivial. Mild aortic valve sclerosis is present, with no evidence of aortic valve stenosis. Aortic valve mean gradient measures 2.0 mmHg. Aortic valve peak gradient measures 3.6 mmHg. Aortic valve area, by VTI measures 1.83 cm. Pulmonic Valve: The pulmonic valve was grossly normal. Pulmonic valve regurgitation is trivial. No evidence of pulmonic stenosis. Aorta: The aortic root, ascending aorta, aortic arch and descending aorta are all structurally normal, with no evidence of dilitation or obstruction. Venous: The inferior vena cava is normal in size with greater than 50% respiratory variability, suggesting right atrial pressure of 3 mmHg. IAS/Shunts: The atrial septum is grossly normal. Additional Comments: There is a small pleural effusion in both left and right lateral regions.  LEFT VENTRICLE PLAX 2D LVIDd:         4.40 cm  Diastology LVIDs:         3.20 cm  LV e' medial:    6.15 cm/s LV PW:         1.30 cm  LV E/e' medial:  8.1 LV IVS:        0.90 cm  LV e'  lateral:   8.03 cm/s LVOT diam:     1.90 cm  LV E/e' lateral: 6.2 LV SV:         36 LV SV Index:   24 LVOT Area:     2.84 cm  RIGHT VENTRICLE RV Basal diam:  2.80 cm LEFT ATRIUM           Index       RIGHT ATRIUM  Index LA diam:      3.20 cm 2.16 cm/m  RA Area:     13.20 cm LA Vol (A2C): 42.9 ml 28.93 ml/m RA Volume:   25.90 ml  17.47 ml/m  AORTIC VALVE AV Area (Vmax):    2.01 cm AV Area (Vmean):   1.89 cm AV Area (VTI):     1.83 cm AV Vmax:           95.20 cm/s AV Vmean:          63.900 cm/s AV VTI:            0.195 m AV Peak Grad:      3.6 mmHg AV Mean Grad:      2.0 mmHg LVOT Vmax:         67.60 cm/s LVOT Vmean:        42.600 cm/s LVOT VTI:          0.126 m LVOT/AV VTI ratio: 0.65  AORTA Ao Root diam: 3.10 cm Ao Asc diam:  3.10 cm MITRAL VALVE MV Area (PHT): 3.42 cm    SHUNTS MV Decel Time: 222 msec    Systemic VTI:  0.13 m MV E velocity: 49.60 cm/s  Systemic Diam: 1.90 cm MV A velocity: 50.70 cm/s MV E/A ratio:  0.98 Megan Dresser MD Electronically signed by Megan Dresser MD Signature Date/Time: 10/29/2020/2:10:02 PM    Final     Telemetry    10/30/20 NSR - Personally Reviewed  ECG    No new tracing as of 10/30/20 - Personally Reviewed  Cardiac Studies   Echocardiogram 10/29/20:   1. Left ventricular ejection fraction, by estimation, is 55 to 60%. The  left ventricle has normal function. The left ventricle has no regional  wall motion abnormalities. Left ventricular diastolic parameters were  normal.   2. Right ventricular systolic function is normal. The right ventricular  size is normal.   3. Mitral inflow velocity varies ~30% with respiration. However, IVC  collapses, which is not consistent with tamponade. Image quality is  limited, but there is no clear RA/RV collapse. Suggests overall no  tamponade, but recommend clinical correlation.  Largest measured diameter 0.82 cm posterior to LV. a small pericardial  effusion is present.   4. The mitral valve is  normal in structure. Trivial mitral valve  regurgitation. No evidence of mitral stenosis.   5. The aortic valve is tricuspid. There is mild calcification of the  aortic valve. Aortic valve regurgitation is trivial. Mild aortic valve  sclerosis is present, with no evidence of aortic valve stenosis.   6. The inferior vena cava is normal in size with greater than 50%  respiratory variability, suggesting right atrial pressure of 3 mmHg.   Patient Profile     75 y.o. female with a hx significant for hypertension, hypothyroidism, generalized anxiety disorder, degenerative disk disease with chronic pain who is being seen 10/29/2020 for the evaluation of new pericardial effusion and chest pain consistent with pericarditis.  Assessment & Plan    1. Myopericarditis: -Presented with pericardial effusion and chest pain consistent with acute pericarditis.  -Continue colchicine 0.57m QD -Started on high dose Ibuprofen 6022mTID>>can increase to 80074mID is pain uncontrolled  -Echocardiogram with LVEF at 55-60% and no WMA , IVC  collapses, which is not consistent with tamponade. Image quality is  limited, but there is no clear RA/RV collapse. Suggests overall no  tamponade, but recommend clinical correlation, small pericardial effusion measuring 0.82cm  posterior to LV.   -  Convert to PO DOAC >>plan for 3 weeks>>see below  -ESR, 117 -CRP, 23.5 -Denies chest pain today   2. Elevated HsT: -In the setting of #1 -HsT 578>>>480 -Stable echo and EKG   3. Atrial fibrillation with RVR: -Short duration prior to conversion  -Started on IV amiodarone>>>transitioned to PO dosing with plan for oral load for 10 days with 200 mg PO daily transition -Initially on IV Heparin>>stop Heparin today    4. Depression/anxiety: -Continue PTA medications   5. Hypothyroidism:  -TSH, 0.32 -May need decrease in synthroid dosing in OP setting with PCP   Signed, Kathyrn Drown NP-C [place] Pager:  [phone] 10/30/2020, 8:08 AM   For questions or updates, please contact   Please consult [email or web address] for contact info under Cardiology/STEMI.  History and all data above reviewed.    No further chest pain.  No SOB. Patient examined.  I agree with the findings as above.  The patient exam reveals COR:RRR  ,  Lungs: Diffuse fine crackles  ,  Abd: Positive bowel sounds, no rebound no guarding, Ext No edema  .  All available labs, radiology testing, previous records reviewed. Agree with documented assessment and plan.   Pericarditis:  OK to go home with colchicine and NSAIDs x 10 days and TOC follow up.  Atrial fib:  I would not suggest DOAC as this was an isolated event and there is a risk of hemorrhagic transformation.  She will watch her heart rate for any tachycardia.  Megan Williams  11:14 AM  10/30/2020

## 2020-10-30 NOTE — TOC Benefit Eligibility Note (Addendum)
Patient Teacher, English as a foreign language completed.    The patient is currently admitted and upon discharge could be taking Eliquis 5 mg.  The current 30 day co-pay is, $40.00.   The patient is currently admitted and upon discharge could be taking Xarelto 20 mg.  The current 30 day co-pay is, $40.00.   The patient is currently admitted and upon discharge could be taking Colchicine 0.6 mg.  The current 30 day co-pay is, $12.00.   The patient is insured through [place], [place] Patient Advocate Specialist [place] Team Direct Number: [phone]  Fax: [phone]

## 2020-10-30 NOTE — Telephone Encounter (Signed)
Transition Care Management Follow-up Telephone Call Date of discharge and from where: 10/30/[facility] How have you been since you were released from the hospital? Better Any questions or concerns? Yes  Items Reviewed: Did the pt receive and understand the discharge instructions provided? Yes  Medications obtained and verified? Yes ,AMIODARONE '400MG'$  1 TAB BID on September10th she will start AMIODARON '200mg'$  once daily,  IBUPROFEN '600MG'$  TID, STOPPED TYLENOL, COLCHICINE 0.'6MG'$  ONE A DAY IN THE AM Other? No  Any new allergies since your discharge? No  Dietary orders reviewed? Yes Do you have support at home? Yes  husband  Home Care and Equipment/Supplies: Were home health services ordered? no If so, what is the name of the agency? N/A  Has the agency set up a time to come to the patient's home? no Were any new equipment or medical supplies ordered?  No What is the name of the medical supply agency? N/A Were you able to get the supplies/equipment? not applicable Do you have any questions related to the use of the equipment or supplies? No  Functional Questionnaire: (I = Independent and D = Dependent) ADLs: I  Bathing/Dressing- I  Meal Prep- I  Eating- I  Maintaining continence- I  Transferring/Ambulation- I  Managing Meds- I  Follow up appointments reviewed:  PCP Hospital f/u appt confirmed? Yes  Scheduled to see Dr. Renold Genta  on 11/10/2020 @ 12:00PM. [facility] f/u appt confirmed? Yes  Scheduled to see 11/13/2020 11:30am on [facility] heart [facility] @ Dr. Gasper Sells. Are transportation arrangements needed? No  If their condition worsens, is the pt aware to call PCP or go to the Emergency Dept.? Yes Was the patient provided with contact information for the PCP's office or ED? Yes Was to pt encouraged to call back with questions or concerns? Yes

## 2020-10-30 NOTE — Discharge Summary (Addendum)
Discharge Summary    Patient ID: GRATIA DISLA MRN: [id number]; DOB: 05-28-1945  Admit date: 10/28/2020 Discharge date: 10/30/2020  PCP:  Elby Showers, MD   [facility] Providers Cardiologist:  Dr. Leward Quan, MD  }   Discharge Diagnoses    Principal Problem:   Acute pericarditis Active Problems:   Hypothyroidism   Hyperlipidemia   History of smoking   Hypertension   COPD (chronic obstructive pulmonary disease) (HCC)   Mild cognitive impairment   Lumbar stenosis with neurogenic claudication   GERD (gastroesophageal reflux disease)   Pericardial effusion   Paroxysmal atrial fibrillation ([place])  Diagnostic Studies/Procedures    Echocardiogram 10/29/20:   1. Left ventricular ejection fraction, by estimation, is 55 to 60%. The  left ventricle has normal function. The left ventricle has no regional  wall motion abnormalities. Left ventricular diastolic parameters were  normal.   2. Right ventricular systolic function is normal. The right ventricular  size is normal.   3. Mitral inflow velocity varies ~30% with respiration. However, IVC  collapses, which is not consistent with tamponade. Image quality is  limited, but there is no clear RA/RV collapse. Suggests overall no  tamponade, but recommend clinical correlation.  Largest measured diameter 0.82 cm posterior to LV. a small pericardial  effusion is present.   4. The mitral valve is normal in structure. Trivial mitral valve  regurgitation. No evidence of mitral stenosis.   5. The aortic valve is tricuspid. There is mild calcification of the  aortic valve. Aortic valve regurgitation is trivial. Mild aortic valve  sclerosis is present, with no evidence of aortic valve stenosis.   6. The inferior vena cava is normal in size with greater than 50%  respiratory variability, suggesting right atrial pressure of 3 mmHg.   History of Present Illness     Megan Williams is a 75 y.o. female with a hx significant for  hypertension, hypothyroidism, generalized anxiety disorder, degenerative disk disease with chronic pain who is being seen 10/29/2020 for the evaluation of new pericardial effusion and chest pain consistent with pericarditis.  Ms. Dudzinski was sent for CT scan n the day of hospital presentation and told to present to the ED after discovery of a pericardial effusion. She had been having worsening chest pain for the last two weeks with the development of worsening shortness of breath with exertion and dyspnea at rest. She was initially started on hydromorphone about a week ago and her PCP sent her for a CT scan and started on steroid burst given intractable chest pain which she thought might be due to musculoskeletal disease (?monoclonal gammopathy).  She was found to have a pericardial effusion and sent to ED for further evaluation. Chest pain was positional, worse with deep respiration and lying flat.   Hospital Course     In the ED, she was noted to have short episode of atrial fibrillation. She was started on IV amiodarone with conversion to NSR. Heparin was started for [facility]. She was subsequently started on PO amiodarone during her acute inflammation period that can be stopped once she is clinically improving. Initially she was felt to need 3 weeks of anticoagulation however upon reassessment today, Dr. Percival Spanish feels that this carries a higher rick of possible hemorrhagic conversion and would therefore stop AC at this time. She will watch her heart rate for any tachycardia.   Hospital concerns:  Myopericarditis: -Presented with pericardial effusion and chest pain consistent with acute pericarditis.  -Continue colchicine 0.65m QD>>continue  -  Started on high dose Ibuprofen 629m TID>>can increase to 8052mTID is pain uncontrolled  -Echocardiogram with LVEF at 55-60% and no WMA , IVC  collapses, which is not consistent with tamponade. Image quality is limited, but there is no clear RA/RV collapse. Suggests  overall no tamponade, but recommend clinical correlation, small pericardial effusion measuring 0.82cm  posterior to LV.   -ESR, 117 -CRP, 23.5 -Denies chest pain today    Elevated HsT: -In the setting of #1 -HsT 578>>>480 -Stable echo and EKG    Atrial fibrillation with RVR: -Short duration prior to conversion  -Started on IV amiodarone>>>transitioned to PO dosing with plan for oral load for 10 days with 200 mg PO daily transition -No AC due to risk of hemorraghic conversion as above     Depression/anxiety: -Continue PTA medications    Hypothyroidism: -TSH, 0.32 -May need decrease in synthroid dosing in OP setting with PCP    Consultants: None    The patient was seen and examined by Dr. HoPercival Spanishho feels that she is stable and ready for discharge today, 10/30/20.   Did the patient have an acute coronary syndrome (MI, NSTEMI, STEMI, etc) this admission?:  No                               Did the patient have a percutaneous coronary intervention (stent / angioplasty)?:  No.   ____________  Discharge Vitals Blood pressure 111/61, pulse 74, temperature 98.2 F (36.8 C), temperature source Oral, resp. rate 18, height '5\' 2"'  (1.575 m), weight 54.6 kg, SpO2 98 %.  Filed Weights   10/29/20 0203 10/29/20 1544  Weight: 49.9 kg 54.6 kg    Labs & Radiologic Studies    CBC Recent Labs    10/29/20 0507 10/30/20 0758  WBC 11.0* 8.1  HGB 10.8* 10.1*  HCT 32.6* 30.7*  MCV 92.9 93.3  PLT 543* 62383  Basic Metabolic Panel Recent Labs    10/29/20 0507 10/30/20 0758  NA 134* 136  K 3.6 4.0  CL 104 106  CO2 22 25  GLUCOSE 162* 92  BUN 9 11  CREATININE 0.79 0.98  CALCIUM 8.2* 8.4*   Liver Function Tests Recent Labs    10/28/20 0933  PROT 6.4   No results for input(s): LIPASE, AMYLASE in the last 72 hours. High Sensitivity Troponin:   Recent Labs  Lab 10/28/20 1722 10/28/20 1939  TROPONINIHS 578* 480*    BNP Invalid input(s): POCBNP D-Dimer No results for  input(s): DDIMER in the last 72 hours. Hemoglobin A1C No results for input(s): HGBA1C in the last 72 hours. Fasting Lipid Panel Recent Labs    10/30/20 0758  CHOL 146  HDL 35*  LDLCALC 82  TRIG 147  CHOLHDL 4.2   Thyroid Function Tests No results for input(s): TSH, T4TOTAL, T3FREE, THYROIDAB in the last 72 hours.  Invalid input(s): FREET3 _____________  DG Chest 2 View  Result Date: 10/28/2020 CLINICAL DATA:  Shortness of breath x1 day EXAM: CHEST - 2 VIEW COMPARISON:  CT chest dated 10/28/2020 FINDINGS: Left basilar scarring. Trace left pleural effusion. No frank interstitial edema. The heart is normal in size. Cervical spine fixation hardware. Visualized thoracolumbar spine is otherwise within normal limits. IMPRESSION: Trace left pleural effusion with mild left basilar scarring. Electronically Signed   By: SrJulian Hy.D.   On: 10/28/2020 19:12   DG Chest 2 View  Result Date: 10/15/2020 CLINICAL DATA:  Chest pain EXAM: CHEST - 2 VIEW COMPARISON:  Radiograph 09/20/2016 FINDINGS: Unchanged cardiomediastinal silhouette. There is irregular opacity in the left peripheral lung base. No airspace disease in the right lung. No large pleural effusion or visible pneumothorax. No acute osseous abnormality. Partially visualized cervical spine and lumbar spine fusion hardware. Prior vertebroplasty of T12. IMPRESSION: Irregular opacity in the peripheral left lung base, favored to be atelectasis and/or scarring, new from prior exam in 2018. Recommend follow-up PA and lateral chest radiograph in 6 weeks. Electronically Signed   By: Maurine Simmering M.D.   On: 10/15/2020 13:37   DG Shoulder Right  Result Date: 10/22/2020 CLINICAL DATA:  Right shoulder pain EXAM: RIGHT SHOULDER - 2+ VIEW COMPARISON:  None FINDINGS: There is no evidence of acute fracture. There is mild AC joint degenerative change. The glenohumeral joint is in normal alignment. Partially visualized cervical spine fusion hardware.  IMPRESSION: Mild AC joint degenerative change. Normal glenohumeral alignment without acute osseous abnormality or significant degenerative change. Electronically Signed   By: Maurine Simmering M.D.   On: 10/22/2020 11:02   CT Chest W Contrast  Result Date: 10/28/2020 CLINICAL DATA:  Follow-up abnormal chest radiograph. EXAM: CT CHEST WITH CONTRAST TECHNIQUE: Multidetector CT imaging of the chest was performed during intravenous contrast administration. CONTRAST:  42m ISOVUE-300 IOPAMIDOL (ISOVUE-300) INJECTION 61% COMPARISON:  10/15/20 FINDINGS: Cardiovascular: Aortic atherosclerosis. There is a moderate pericardial effusion with overlying enhancement of the pericardium. Heart size is normal. Mediastinum/Nodes: Thyroid gland is atrophic or surgically absent. The trachea appears patent and is midline. Normal appearance of the esophagus. Multiple small mediastinal lymph nodes are identified, none of which meet CT criteria for adenopathy. These measure up to 7 mm. No hilar adenopathy. Lungs/Pleura: Small bilateral pleural effusions. Centrilobular emphysema. No airspace consolidation, or pneumothorax. Mild dependent subsegmental atelectasis noted within the lung bases. There is also mild subsegmental atelectasis in the right middle lobe and lingula. No airspace consolidation to suggest pneumonia. Upper Abdomen: Status post cholecystectomy. Increase caliber of the CBD measures 1.4 cm proximally. There is mild to moderate intrahepatic bile duct dilatation. The distal CBD is not included on this study. Similar appearance of septated cyst arising off the upper pole of the left kidney measuring 4.6 cm, image 142/2. Musculoskeletal: There is a treated compression fracture involving the T12 vertebral body. No acute or suspicious bone lesions identified IMPRESSION: 1. Moderate pericardial effusion with overlying enhancement of the pericardium. Findings are concerning for pericarditis. 2. Small bilateral pleural effusions. 3.  Emphysema and aortic atherosclerosis. 4. There is moderate intrahepatic dilatation and dilatation of the common bile duct status post cholecystectomy. This has progressed from 12/29/2011. The mid and distal portions of the CBD and head of pancreas are not included on this study. Aortic Atherosclerosis (ICD10-I70.0) and Emphysema (ICD10-J43.9). Electronically Signed   By: TKerby MoorsM.D.   On: 10/28/2020 15:30   MM DIAG BREAST TOMO UNI LEFT  Result Date: 10/23/2020 CLINICAL DATA:  Patient recalled from screening for possible asymmetry left breast. EXAM: DIGITAL DIAGNOSTIC UNILATERAL LEFT MAMMOGRAM WITH TOMOSYNTHESIS AND CAD TECHNIQUE: Left digital diagnostic mammography and breast tomosynthesis was performed. The images were evaluated with computer-aided detection. COMPARISON:  Previous exam(s). ACR Breast Density Category d: The breast tissue is extremely dense, which lowers the sensitivity of mammography. FINDINGS: Questioned asymmetry within the outer left breast resolved with additional imaging most compatible with dense overlapping fibroglandular tissue. IMPRESSION: No mammographic evidence for malignancy. RECOMMENDATION: Screening mammogram in one year.(Code:SM-B-01Y) I have discussed the findings and  recommendations with the patient. If applicable, a reminder letter will be sent to the patient regarding the next appointment. BI-RADS CATEGORY  1: Negative. Electronically Signed   By: Lovey Newcomer M.D.   On: 10/23/2020 08:53   MM 3D SCREEN BREAST BILATERAL  Result Date: 10/07/2020 CLINICAL DATA:  Screening. EXAM: DIGITAL SCREENING BILATERAL MAMMOGRAM WITH TOMOSYNTHESIS AND CAD TECHNIQUE: Bilateral screening digital craniocaudal and mediolateral oblique mammograms were obtained. Bilateral screening digital breast tomosynthesis was performed. The images were evaluated with computer-aided detection. COMPARISON:  Previous exam(s). ACR Breast Density Category c: The breast tissue is heterogeneously dense,  which may obscure small masses. FINDINGS: In the left breast, a possible asymmetry in the outer left breast at middle depth warrants further evaluation (CC frame 42/48, possible correlate in the lower breast on MLO view frame 26/52). In the right breast, no findings suspicious for malignancy. IMPRESSION: Further evaluation is suggested for possible asymmetry in the left breast. RECOMMENDATION: Diagnostic mammogram and possibly ultrasound of the left breast. (Code:[id number]) The patient will be contacted regarding the findings, and additional imaging will be scheduled. BI-RADS CATEGORY  0: Incomplete. Need additional imaging evaluation and/or prior mammograms for comparison. Electronically Signed   By: Ileana Roup MD   On: 10/07/2020 15:29   ECHOCARDIOGRAM COMPLETE  Result Date: 10/29/2020    ECHOCARDIOGRAM REPORT   Patient Name:   COUTNEY WILDERMUTH Reyburn Date of Exam: 10/29/2020 Medical Rec #:  [id number]     Height:       62.0 in Accession #:    [id number]    Weight:       110.0 lb Date of Birth:  09-Oct-1945     BSA:          1.483 m Patient Age:    47 years      BP:           96/53 mmHg Patient Gender: F             HR:           72 bpm. Exam Location:  Inpatient Procedure: 2D Echo, Cardiac Doppler and Color Doppler Indications:    Pericardial effusion I31.3  History:        Patient has no prior history of Echocardiogram examinations.  Sonographer:    Merrie Roof RDCS Referring Phys: [id number] [place]  1. Left ventricular ejection fraction, by estimation, is 55 to 60%. The left ventricle has normal function. The left ventricle has no regional wall motion abnormalities. Left ventricular diastolic parameters were normal.  2. Right ventricular systolic function is normal. The right ventricular size is normal.  3. Mitral inflow velocity varies ~30% with respiration. However, IVC collapses, which is not consistent with tamponade. Image quality is limited, but there is no clear RA/RV collapse. Suggests overall  no tamponade, but recommend clinical correlation. Largest measured diameter 0.82 cm posterior to LV. a small pericardial effusion is present.  4. The mitral valve is normal in structure. Trivial mitral valve regurgitation. No evidence of mitral stenosis.  5. The aortic valve is tricuspid. There is mild calcification of the aortic valve. Aortic valve regurgitation is trivial. Mild aortic valve sclerosis is present, with no evidence of aortic valve stenosis.  6. The inferior vena cava is normal in size with greater than 50% respiratory variability, suggesting right atrial pressure of 3 mmHg. Comparison(s): No prior Echocardiogram. Conclusion(s)/Recommendation(s): Small pericardial effusion. Echo does not suggest tamponade, but recommend clinical correlation. FINDINGS  Left Ventricle: Left ventricular ejection fraction,  by estimation, is 55 to 60%. The left ventricle has normal function. The left ventricle has no regional wall motion abnormalities. The left ventricular internal cavity size was normal in size. There is  borderline left ventricular hypertrophy. Left ventricular diastolic parameters were normal. Right Ventricle: The right ventricular size is normal. No increase in right ventricular wall thickness. Right ventricular systolic function is normal. Left Atrium: Left atrial size was normal in size. Right Atrium: Right atrial size was normal in size. Pericardium: Mitral inflow velocity varies ~30% with respiration. However, IVC collapses, which is not consistent with tamponade. Image quality is limited, but there is no clear RA/RV collapse. Suggests overall no tamponade, but recommend clinical correlation. Largest measured diameter 0.82 cm posterior to LV. A small pericardial effusion is present. Mitral Valve: The mitral valve is normal in structure. There is mild thickening of the mitral valve leaflet(s). There is mild calcification of the mitral valve leaflet(s). Trivial mitral valve regurgitation. No evidence  of mitral valve stenosis. Tricuspid Valve: The tricuspid valve is normal in structure. Tricuspid valve regurgitation is trivial. No evidence of tricuspid stenosis. Aortic Valve: The aortic valve is tricuspid. There is mild calcification of the aortic valve. Aortic valve regurgitation is trivial. Mild aortic valve sclerosis is present, with no evidence of aortic valve stenosis. Aortic valve mean gradient measures 2.0 mmHg. Aortic valve peak gradient measures 3.6 mmHg. Aortic valve area, by VTI measures 1.83 cm. Pulmonic Valve: The pulmonic valve was grossly normal. Pulmonic valve regurgitation is trivial. No evidence of pulmonic stenosis. Aorta: The aortic root, ascending aorta, aortic arch and descending aorta are all structurally normal, with no evidence of dilitation or obstruction. Venous: The inferior vena cava is normal in size with greater than 50% respiratory variability, suggesting right atrial pressure of 3 mmHg. IAS/Shunts: The atrial septum is grossly normal. Additional Comments: There is a small pleural effusion in both left and right lateral regions.  LEFT VENTRICLE PLAX 2D LVIDd:         4.40 cm  Diastology LVIDs:         3.20 cm  LV e' medial:    6.15 cm/s LV PW:         1.30 cm  LV E/e' medial:  8.1 LV IVS:        0.90 cm  LV e' lateral:   8.03 cm/s LVOT diam:     1.90 cm  LV E/e' lateral: 6.2 LV SV:         36 LV SV Index:   24 LVOT Area:     2.84 cm  RIGHT VENTRICLE RV Basal diam:  2.80 cm LEFT ATRIUM           Index       RIGHT ATRIUM           Index LA diam:      3.20 cm 2.16 cm/m  RA Area:     13.20 cm LA Vol (A2C): 42.9 ml 28.93 ml/m RA Volume:   25.90 ml  17.47 ml/m  AORTIC VALVE AV Area (Vmax):    2.01 cm AV Area (Vmean):   1.89 cm AV Area (VTI):     1.83 cm AV Vmax:           95.20 cm/s AV Vmean:          63.900 cm/s AV VTI:            0.195 m AV Peak Grad:      3.6 mmHg AV Mean  Grad:      2.0 mmHg LVOT Vmax:         67.60 cm/s LVOT Vmean:        42.600 cm/s LVOT VTI:          0.126  m LVOT/AV VTI ratio: 0.65  AORTA Ao Root diam: 3.10 cm Ao Asc diam:  3.10 cm MITRAL VALVE MV Area (PHT): 3.42 cm    SHUNTS MV Decel Time: 222 msec    Systemic VTI:  0.13 m MV E velocity: 49.60 cm/s  Systemic Diam: 1.90 cm MV A velocity: 50.70 cm/s MV E/A ratio:  0.98 Buford Dresser MD Electronically signed by Buford Dresser MD Signature Date/Time: 10/29/2020/2:10:02 PM    Final    Disposition   Pt is being discharged home today in good condition.  Follow-up Plans & Appointments    Follow-up Information     Werner Lean, MD Follow up on 11/13/2020.   Specialty: Cardiology Why: 11:30am Contact information: [address] [place] [address] [phone]                Discharge Instructions     Call MD for:  difficulty breathing, headache or visual disturbances   Complete by: As directed    Call MD for:  extreme fatigue   Complete by: As directed    Call MD for:  hives   Complete by: As directed    Call MD for:  persistant dizziness or light-headedness   Complete by: As directed    Call MD for:  persistant nausea and vomiting   Complete by: As directed    Call MD for:  redness, tenderness, or signs of infection (pain, swelling, redness, odor or green/yellow discharge around incision site)   Complete by: As directed    Call MD for:  severe uncontrolled pain   Complete by: As directed    Call MD for:  temperature >100.4   Complete by: As directed    Diet - low sodium heart healthy   Complete by: As directed    Increase activity slowly   Complete by: As directed       Discharge Medications   Allergies as of 10/30/2020       Reactions   Doxycycline Other (See Comments)   Skin peeling   Penicillins Hives   Has patient had a PCN reaction causing immediate rash, facial/tongue/throat swelling, SOB or lightheadedness with hypotension: No Has patient had a PCN reaction causing severe rash involving mucus membranes or skin necrosis:  No Has patient had a PCN reaction that required hospitalization: No Has patient had a PCN reaction occurring within the last 10 years: No If all of the above answers are "NO", then may proceed with Cephalosporin use.   Celebrex [celecoxib] Other (See Comments)   Blistering, pain, swelling   Cephalosporins Nausea Only   Diarrhea, hives   Gabapentin    Causes TICS   Morphine And Related Itching   Latex Rash        Medication List     TAKE these medications    amiodarone 400 MG tablet Commonly known as: PACERONE Take 1 tablet (400 mg total) by mouth 2 (two) times daily.   amiodarone 200 MG tablet Commonly known as: PACERONE Take 1 tablet (200 mg total) by mouth daily. Start taking on: November 08, 2020   B-12 1000 MCG Subl Place 1,000 mcg under the tongue daily.   colchicine 0.6 MG tablet Take 1 tablet (0.6 mg  total) by mouth daily. Start taking on: October 31, 2020   cyclobenzaprine 10 MG tablet Commonly known as: FLEXERIL Take 10 mg by mouth 3 (three) times daily as needed for muscle spasms.   denosumab 60 MG/ML Soln injection Commonly known as: PROLIA Inject 60 mg into the skin every 6 (six) months. Administer in upper arm, thigh, or abdomen   diazepam 5 MG tablet Commonly known as: VALIUM Take 2.5-5 mg by mouth daily as needed for anxiety.   diphenhydrAMINE 25 MG tablet Commonly known as: BENADRYL Take 25-50 mg by mouth daily as needed (hives).   estradiol 1 MG tablet Commonly known as: ESTRACE Take 1 tablet (1 mg total) by mouth daily.   fenofibrate 160 MG tablet TAKE 1 TABLET BY MOUTH DAILY.   fexofenadine 60 MG tablet Commonly known as: ALLEGRA Take 60 mg by mouth daily.   fluticasone 50 MCG/ACT nasal spray Commonly known as: FLONASE Place 1 spray into both nostrils daily.   Gemtesa 75 MG Tabs Generic drug: Vibegron Take 75 mg by mouth daily.   HYDROmorphone 2 MG tablet Commonly known as: Dilaudid Take 1 tablet (2 mg total) by mouth  every 4 (four) hours as needed for severe pain.   ibuprofen 600 MG tablet Commonly known as: ADVIL Take 1 tablet (600 mg total) by mouth 3 (three) times daily.   levothyroxine 100 MCG tablet Commonly known as: SYNTHROID TAKE 1 TABLET BY MOUTH DAILY. What changed: when to take this   Magnesium 250 MG Tabs Take 250 mg by mouth daily.   Melatonin 10 MG Tabs Take 10 mg by mouth at bedtime.   memantine 10 MG tablet Commonly known as: NAMENDA Take 1 tablet (10 mg total) by mouth 2 (two) times daily. TAKE 10 mg TABLET BY MOUTH 2 TIMES DAILY.   methylPREDNISolone 4 MG tablet Commonly known as: Medrol TAKE IN TAPERING COURSE 6,5,4,3,2,1   multivitamin with minerals Tabs tablet Take 2 tablets by mouth daily. One a day women's   pantoprazole 40 MG tablet Commonly known as: PROTONIX TAKE 1 TABLET BY MOUTH DAILY.   Restasis 0.05 % ophthalmic emulsion Generic drug: cycloSPORINE Place 1 drop into both eyes 2 (two) times daily.   rOPINIRole 0.5 MG tablet Commonly known as: REQUIP Take 0.5 mg by mouth at bedtime.   traZODone 100 MG tablet Commonly known as: DESYREL Take 200 mg by mouth at bedtime.   valACYclovir 500 MG tablet Commonly known as: VALTREX One po twice daily for 5 days What changed:  how much to take how to take this when to take this reasons to take this additional instructions   venlafaxine XR 150 MG 24 hr capsule Commonly known as: EFFEXOR-XR Take 300 mg by mouth daily with breakfast.   Vitamin D 50 MCG (2000 UT) tablet Take 2,000 Units by mouth daily.       \ Outstanding Labs/Studies   None   Duration of Discharge Encounter   Greater than 30 minutes including physician time.  Signed, Kathyrn Drown, NP 10/30/2020, 12:16 PM  Patient seen and examined.  Plan as discussed in my rounding note for today and outlined above. Jeneen Rinks Khadeja Abt  10/30/2020  1:16 PM

## 2020-10-30 NOTE — Progress Notes (Addendum)
Spoke to Kathyrn Drown, NP about the patient's home Medrol, she states to instruct patient to discontinue this medication.  Patient informed and medication marked out on AVS.

## 2020-11-06 LAB — PROTEIN,TOTAL AND PROTEIN ELECTROPHORESIS, RANDOM URINE(REFL)
Albumin: 42 %
Alpha-1-Globulin, U: 4 %
Alpha-2-Globulin, U: 21 %
Beta Globulin, U: 19 %
Creatinine, Urine: 173 mg/dL (ref 20–275)
Gamma Globulin, U: 14 %
Protein/Creat Ratio: 202 mg/g creat — ABNORMAL HIGH (ref 24–184)
Protein/Creatinine Ratio: 0.202 mg/mg creat — ABNORMAL HIGH (ref 0.024–0.184)
Total Protein, Urine: 35 mg/dL — ABNORMAL HIGH (ref 5–24)

## 2020-11-06 LAB — PROTEIN ELECTROPHORESIS, SERUM
Albumin ELP: 3.2 g/dL — ABNORMAL LOW (ref 3.8–4.8)
Alpha 1: 0.7 g/dL — ABNORMAL HIGH (ref 0.2–0.3)
Alpha 2: 0.8 g/dL (ref 0.5–0.9)
Beta 2: 0.3 g/dL (ref 0.2–0.5)
Beta Globulin: 0.4 g/dL (ref 0.4–0.6)
Gamma Globulin: 1 g/dL (ref 0.8–1.7)
Total Protein: 6.4 g/dL (ref 6.1–8.1)

## 2020-11-06 LAB — IMMUNOFIXATION ELECTROPHORESIS
IgG (Immunoglobin G), Serum: 1291 mg/dL (ref 600–1540)
IgM, Serum: 70 mg/dL (ref 50–300)
Immunofix Electr Int: NOT DETECTED
Immunoglobulin A: <5 mg/dL — ABNORMAL LOW (ref 70–320)

## 2020-11-07 ENCOUNTER — Inpatient Hospital Stay: Payer: PPO | Admitting: Internal Medicine

## 2020-11-10 ENCOUNTER — Ambulatory Visit (INDEPENDENT_AMBULATORY_CARE_PROVIDER_SITE_OTHER): Payer: PPO | Admitting: Internal Medicine

## 2020-11-10 ENCOUNTER — Other Ambulatory Visit: Payer: Self-pay

## 2020-11-10 ENCOUNTER — Other Ambulatory Visit: Payer: Self-pay | Admitting: Cardiology

## 2020-11-10 ENCOUNTER — Encounter: Payer: Self-pay | Admitting: Internal Medicine

## 2020-11-10 VITALS — BP 110/78 | Ht 62.0 in | Wt 117.0 lb

## 2020-11-10 DIAGNOSIS — I319 Disease of pericardium, unspecified: Secondary | ICD-10-CM | POA: Diagnosis not present

## 2020-11-10 NOTE — Progress Notes (Signed)
Subjective:    Patient ID: Megan Williams, female    DOB: 08-09-1945, 75 y.o.   MRN: [id number]  HPI The patient had a CT of her chest on August 30 for evaluation of chest discomfort, upper shoulder pain as well as very high  sed rate and was found to have a moderate sized pericardial effusion.  She was advised to go directly to the hospital for evaluation and was admitted.  She was seen by [facility] cardiologists.  Echocardiogram was performed showing an ejection fraction estimated to be 55 to 60% with normal left ventricular function.  Right ventricular systolic function was normal.  No evidence of tamponade.  Mitral valve was normal.  Had trivial mitral valve regurgitation and no evidence of mitral stenosis.  Aortic valve was tricuspid and there was mild calcification of the aortic valve.  Mild aortic valve sclerosis was present but no stenosis.  She also was found to have atrial fibrillation and was put on anticoagulation.  She was treated with colchicine.  She was given ibuprofen.  She is now here for hospitalization follow-up.  Clinical course:  She came to my office on August 16 saying that she had called EMS personnel to her house around 1 AM that morning.  They had suggested she go to the emergency department but she refused transport.  She was here on August 16 for fasting labs in anticipation of her Medicare wellness visit and health maintenance exam on August 23.  She and her husband wanted to go to the beach on August 16 but since she became ill last night, they canceled their trip.  EKG showed no change from EKG in 2019.  She was tender over her posterior neck and right shoulder as well as her right anterior chest wall.  She was given Dilaudid 2 mg to take up to 3 times daily for pain and was advised to rest at home.  Her pulse was 100 and her blood pressure was 100/70 with temperature 99.6 degrees.  Her pulse oximetry was 96%.  COVID test was negative.  Troponin 1 was negative at 4.  Total CK  was 44.  She had an elevated white blood cell count of 14,300 but 3 days later it was 8100.  She was seen on August 23 for Medicare annual wellness visit.  She had rheumatology studies and her sed rate was quite high at 128.  She is still having some musculoskeletal pain and I entertain the possibility of polymyalgia rheumatica.  An SPEP was obtained considering the possibility of multiple myeloma but this proved to be negative.  She has a history of hypothyroidism, hypertension, hyperlipidemia, anxiety, depression, COPD and mild memory loss.  Followed by Dr. Ellene Route having had multiple back surgeries.  History of osteopenia.  Takes Prolia per Dr. Layne Benton.  Has history of depression and sees Noemi Chapel for counseling and medication.  Unfortunately she and her husband lost their dog during this time.  Dog was having some medical issues and had to be euthanized.  Patient and her husband went to trip to [place] after hospitalization discharge and she says she did fairly well but was a little fatigued.  Have repeated sed rate and it is now down from 128 to 58.  White blood cell count is now normal.  However creatinine is now elevated at 1.49 and in late August was less than 1.  C-reactive protein is 17.4 and was 23.5 previously.  Hemoglobin is excellent at 12.1 g.  Platelets  are decreasing from 661,000-446,000.  She has been followed closely by Cardiology.  Review of Systems she still is complaining of some musculoskeletal pain.  She has appointment with Cardiologist later this week.  However overall she is feeling much better.     Objective:   Physical Exam Vital signs reviewed.  She still looks a bit frail but is much better.  Blood pressure 110/78 weight 117 pounds BMI 21.40.  Skin is warm and dry.  Her chest is clear. Cardiac exam RRR without ectopy.  No lower extremity edema       Assessment & Plan:   Acute pericarditis- improved  Plan: Although her sed rate was quite high.   Evaluation for myeloma was negative and pain improved with treatment for pericarditis. Continue close follow up with cardiology and RTC here in late September.

## 2020-11-10 NOTE — Patient Instructions (Addendum)
Labs reviewed from admission. Labs rechecked today. Has appt with cardiologist later this week. Back pain and shoulder pain have improved. RTC here late September.

## 2020-11-11 ENCOUNTER — Telehealth: Payer: Self-pay

## 2020-11-11 LAB — COMPLETE METABOLIC PANEL WITH GFR
AG Ratio: 1.3 (calc) (ref 1.0–2.5)
ALT: 6 U/L (ref 6–29)
AST: 13 U/L (ref 10–35)
Albumin: 3.6 g/dL (ref 3.6–5.1)
Alkaline phosphatase (APISO): 53 U/L (ref 37–153)
BUN/Creatinine Ratio: 9 (calc) (ref 6–22)
BUN: 13 mg/dL (ref 7–25)
CO2: 30 mmol/L (ref 20–32)
Calcium: 9.6 mg/dL (ref 8.6–10.4)
Chloride: 103 mmol/L (ref 98–110)
Creat: 1.49 mg/dL — ABNORMAL HIGH (ref 0.60–1.00)
Globulin: 2.8 g/dL (calc) (ref 1.9–3.7)
Glucose, Bld: 86 mg/dL (ref 65–99)
Potassium: 5.2 mmol/L (ref 3.5–5.3)
Sodium: 139 mmol/L (ref 135–146)
Total Bilirubin: 0.4 mg/dL (ref 0.2–1.2)
Total Protein: 6.4 g/dL (ref 6.1–8.1)
eGFR: 37 mL/min/{1.73_m2} — ABNORMAL LOW (ref >=60)

## 2020-11-11 LAB — C-REACTIVE PROTEIN: CRP: 17.4 mg/L — ABNORMAL HIGH (ref <8.0)

## 2020-11-11 LAB — CBC WITH DIFFERENTIAL/PLATELET
Absolute Monocytes: 446 cells/uL (ref 200–950)
Basophils Absolute: 49 cells/uL (ref 0–200)
Basophils Relative: 0.5 %
Eosinophils Absolute: 29 cells/uL (ref 15–500)
Eosinophils Relative: 0.3 %
HCT: 37.1 % (ref 35.0–45.0)
Hemoglobin: 12.1 g/dL (ref 11.7–15.5)
Lymphs Abs: 2076 cells/uL (ref 850–3900)
MCH: 30.6 pg (ref 27.0–33.0)
MCHC: 32.6 g/dL (ref 32.0–36.0)
MCV: 93.7 fL (ref 80.0–100.0)
MPV: 10 fL (ref 7.5–12.5)
Monocytes Relative: 4.6 %
Neutro Abs: 7100 cells/uL (ref 1500–7800)
Neutrophils Relative %: 73.2 %
Platelets: 446 10*3/uL — ABNORMAL HIGH (ref 140–400)
RBC: 3.96 10*6/uL (ref 3.80–5.10)
RDW: 13.2 % (ref 11.0–15.0)
Total Lymphocyte: 21.4 %
WBC: 9.7 10*3/uL (ref 3.8–10.8)

## 2020-11-11 LAB — SEDIMENTATION RATE: Sed Rate: 58 mm/h — ABNORMAL HIGH (ref 0–30)

## 2020-11-11 NOTE — Telephone Encounter (Signed)
Patient aware of results and recommendations.  Appointment scheduled.

## 2020-11-11 NOTE — Telephone Encounter (Signed)
-----   Message from Elby Showers, MD sent at 11/11/2020  9:28 AM EDT ----- Let her know her sed rate has come down which is encouraging. It should be repeated here in 2 weeks with OV. Her Creatinine is elevated and maybe she was fasting and dry. That should be repeated then too. She should drink a couple of glasses of water before coming in ----- Message ----- From: Buel Ream, [facility] Lab Results In Sent: 11/10/2020   8:33 PM EDT To: Elby Showers, MD

## 2020-11-13 ENCOUNTER — Other Ambulatory Visit: Payer: Self-pay

## 2020-11-13 ENCOUNTER — Ambulatory Visit (INDEPENDENT_AMBULATORY_CARE_PROVIDER_SITE_OTHER): Payer: PPO

## 2020-11-13 ENCOUNTER — Ambulatory Visit (INDEPENDENT_AMBULATORY_CARE_PROVIDER_SITE_OTHER): Payer: PPO | Admitting: Internal Medicine

## 2020-11-13 ENCOUNTER — Encounter: Payer: Self-pay | Admitting: Internal Medicine

## 2020-11-13 VITALS — BP 110/68 | HR 69 | Ht 62.0 in | Wt 116.6 lb

## 2020-11-13 DIAGNOSIS — I480 Paroxysmal atrial fibrillation: Secondary | ICD-10-CM

## 2020-11-13 DIAGNOSIS — I319 Disease of pericardium, unspecified: Secondary | ICD-10-CM | POA: Diagnosis not present

## 2020-11-13 DIAGNOSIS — I3139 Other pericardial effusion (noninflammatory): Secondary | ICD-10-CM

## 2020-11-13 DIAGNOSIS — I313 Pericardial effusion (noninflammatory): Secondary | ICD-10-CM | POA: Diagnosis not present

## 2020-11-13 MED ORDER — COLCHICINE 0.6 MG PO TABS
0.3000 mg | ORAL_TABLET | Freq: Every day | ORAL | 1 refills | Status: DC
Start: 2020-11-13 — End: 2021-02-13

## 2020-11-13 NOTE — Patient Instructions (Signed)
Medication Instructions:  Your physician has recommended you make the following change in your medication:  DECREASE: colchicine to 0.3 mg by mouth daily CONTINUE: Ibuprofen 600 mg for up to 1 month  *If you need a refill on your cardiac medications before your next appointment, please call your pharmacy*   Lab Work: TODAY: ESR, CRP If you have labs (blood work) drawn today and your tests are completely normal, you will receive your results only by: [facility] (if you have MyChart) OR A paper copy in the mail If you have any lab test that is abnormal or we need to change your treatment, we will call you to review the results.   Testing/Procedures: Your physician has requested that you have a Limited echocardiogram. Echocardiography is a painless test that uses sound waves to create images of your heart. It provides your doctor with information about the size and shape of your heart and how well your heart's chambers and valves are working. This procedure takes approximately one hour. There are no  restrictions for this procedure.  Your physician has requested that you wear a 7 day heart monitor.     Follow-Up: At [facility], you and your health needs are our priority.  As part of our continuing mission to provide you with exceptional heart care, we have created designated Provider Care Teams.  These Care Teams include your primary Cardiologist (physician) and Advanced Practice Providers (APPs -  Physician Assistants and Nurse Practitioners) who all work together to provide you with the care you need, when you need it.   Your next appointment:   3 month(s)  The format for your next appointment:   In Person  Provider:   You may see Werner Lean, MD or one of the following Advanced Practice Providers on your designated Care Team:   Melina Copa, PA-C Ermalinda Barrios, PA-C   Other Instructions  Bryn Gulling- Long Term Monitor Instructions  Your physician has requested  you wear a ZIO patch monitor for 14 days.  This is a single patch monitor. [employer] supplies one patch monitor per enrollment. Additional stickers are not available. Please do not apply patch if you will be having a Nuclear Stress Test,  Echocardiogram, Cardiac CT, MRI, or Chest Xray during the period you would be wearing the  monitor. The patch cannot be worn during these tests. You cannot remove and re-apply the  ZIO XT patch monitor.  Your ZIO patch monitor will be mailed 3 day USPS to your address on file. It may take 3-5 days  to receive your monitor after you have been enrolled.  Once you have received your monitor, please review the enclosed instructions. Your monitor  has already been registered assigning a specific monitor serial # to you.  Billing and Patient Assistance Program Information  We have supplied [employer] with any of your insurance information on file for billing purposes. [employer] offers a sliding scale Patient Assistance Program for patients that do not have  insurance, or whose insurance does not completely cover the cost of the ZIO monitor.  You must apply for the Patient Assistance Program to qualify for this discounted rate.  To apply, please call [employer] at [phone], select option 4, select option 2, ask to apply for  Patient Assistance Program. Theodore Demark will ask your household income, and how many people  are in your household. They will quote your out-of-pocket cost based on that information.  [employer] will also be able to set up a 83-month  interest-free payment plan if needed.  Applying the monitor   Shave hair from upper left chest.  Hold abrader disc by orange tab. Rub abrader in 40 strokes over the upper left chest as  indicated in your monitor instructions.  Clean area with 4 enclosed alcohol pads. Let dry.  Apply patch as indicated in monitor instructions. Patch will be placed under collarbone on left  side of chest with arrow pointing upward.  Rub  patch adhesive wings for 2 minutes. Remove white label marked "1". Remove the white  label marked "2". Rub patch adhesive wings for 2 additional minutes.  While looking in a mirror, press and release button in center of patch. A small green light will  flash 3-4 times. This will be your only indicator that the monitor has been turned on.  Do not shower for the first 24 hours. You may shower after the first 24 hours.  Press the button if you feel a symptom. You will hear a small click. Record Date, Time and  Symptom in the Patient Logbook.  When you are ready to remove the patch, follow instructions on the last 2 pages of Patient  Logbook. Stick patch monitor onto the last page of Patient Logbook.  Place Patient Logbook in the blue and white box. Use locking tab on box and tape box closed  securely. The blue and white box has prepaid postage on it. Please place it in the mailbox as  soon as possible. Your physician should have your test results approximately 7 days after the  monitor has been mailed back to [facility].  Call [place] at [phone] if you have questions regarding  your ZIO XT patch monitor. Call them immediately if you see an orange light blinking on your  monitor.  If your monitor falls off in less than 4 days, contact our Monitor department at [phone].  If your monitor becomes loose or falls off after 4 days call [employer] at [phone] for  suggestions on securing your monitor

## 2020-11-13 NOTE — Progress Notes (Signed)
Cardiology Office Note:    Date:  11/13/2020   ID:  Megan Williams, DOB September 24, 1945, MRN [id number]  PCP:  Elby Showers, MD   [facility] Providers Cardiologist:  Werner Lean, MD     Referring MD: Elby Showers, MD   CC:  Follow up pericarditis  History of Present Illness:    Megan Williams is a 75 y.o. female with a hx of HTN seen 10/29/20 with new pericardial effusion and prodrome consistent with myopericarditis; with reactive atrial fibrillation.  Recent discharge on amiodarone (had converted through course), Ibuprofen, and colchicine.  Seen 11/13/20.  Patient notes that she is doing much better.  Since leaving the hospital she has had diarrhea with the colchicine.  Has had no further.  Has still had some pain that has persisted but this has improved with ibuprofen. Was asymptomatic from the AF during her admission.  No breathing issues, but does feel a little bit more fatigued.  Has had loss of appetite.  Notes she has still having issues with smoking cessation  Past Medical History:  Diagnosis Date   Allergy    Anxiety    Arthritis    Complication of anesthesia    "difficulty waking up. I could hear them but I couldn't wake up"   COPD (chronic obstructive pulmonary disease) (HCC)    Depression    Emphysema    Esophagitis    Herpes simplex    Hypertension    Hypothyroidism    Migraines    Osteopenia    Vitamin D deficiency     Past Surgical History:  Procedure Laterality Date   BACK SURGERY  02/14/2018   lumbar 3-4  fusion   BREAST EXCISIONAL BIOPSY Left 1979   Benign    CATARACT EXTRACTION, BILATERAL     CERVICAL FUSION     CHOLECYSTECTOMY     KYPHOPLASTY  09/19/2015   Dr. Ellene Route  T12   LUMBAR Anthony SURGERY  11/11/2017   L3-4   Thumb surg     THYROIDECTOMY     TONSILLECTOMY     VAGINAL HYSTERECTOMY      Current Medications: Current Meds  Medication Sig   amiodarone (PACERONE) 400 MG tablet Take 1 tablet (400 mg total) by mouth 2 (two)  times daily.   Cholecalciferol (VITAMIN D) 50 MCG (2000 UT) tablet Take 2,000 Units by mouth daily.   colchicine 0.6 MG tablet Take 0.5 tablets (0.3 mg total) by mouth daily.   Cyanocobalamin (B-12) 1000 MCG SUBL Place 1,000 mcg under the tongue daily.   cyclobenzaprine (FLEXERIL) 10 MG tablet Take 10 mg by mouth 3 (three) times daily as needed for muscle spasms.   denosumab (PROLIA) 60 MG/ML SOLN injection Inject 60 mg into the skin every 6 (six) months. Administer in upper arm, thigh, or abdomen   diazepam (VALIUM) 5 MG tablet Take 2.5-5 mg by mouth daily as needed for anxiety.   diphenhydrAMINE (BENADRYL) 25 MG tablet Take 25-50 mg by mouth daily as needed (hives).    estradiol (ESTRACE) 1 MG tablet Take 1 tablet (1 mg total) by mouth daily.   fenofibrate 160 MG tablet TAKE 1 TABLET BY MOUTH DAILY.   fexofenadine (ALLEGRA) 60 MG tablet Take 60 mg by mouth daily.   fluticasone (FLONASE) 50 MCG/ACT nasal spray Place 1 spray into both nostrils daily.   GEMTESA 75 MG TABS Take 75 mg by mouth daily.   HYDROmorphone (DILAUDID) 2 MG tablet Take 1 tablet (2 mg total)  by mouth every 4 (four) hours as needed for severe pain.   IBU 600 MG tablet TAKE 1 TABLET BY MOUTH 3 TIMES DAILY.   levothyroxine (SYNTHROID) 100 MCG tablet TAKE 1 TABLET BY MOUTH DAILY.   Magnesium 250 MG TABS Take 250 mg by mouth daily.   Melatonin 10 MG TABS Take 10 mg by mouth at bedtime.   memantine (NAMENDA) 10 MG tablet Take 1 tablet (10 mg total) by mouth 2 (two) times daily. TAKE 10 mg TABLET BY MOUTH 2 TIMES DAILY.   Multiple Vitamin (MULTIVITAMIN WITH MINERALS) TABS tablet Take 2 tablets by mouth daily. One a day women's   pantoprazole (PROTONIX) 40 MG tablet TAKE 1 TABLET BY MOUTH DAILY.   RESTASIS 0.05 % ophthalmic emulsion Place 1 drop into both eyes 2 (two) times daily.   rOPINIRole (REQUIP) 0.5 MG tablet Take 0.5 mg by mouth at bedtime.   traZODone (DESYREL) 100 MG tablet Take 200 mg by mouth at bedtime.    valACYclovir (VALTREX) 500 MG tablet One po twice daily for 5 days (Patient taking differently: Take 500 mg by mouth 2 (two) times daily as needed (Fever blister/ Shingles). for 5 days)   venlafaxine XR (EFFEXOR-XR) 150 MG 24 hr capsule Take 300 mg by mouth daily with breakfast.   [DISCONTINUED] colchicine 0.6 MG tablet Take 1 tablet (0.6 mg total) by mouth daily.     Allergies:   Doxycycline, Penicillins, Celebrex [celecoxib], Cephalosporins, Gabapentin, Morphine and related, and Latex   Social History   Socioeconomic History   Marital status: Married    Spouse name: Not on file   Number of children: 2   Years of education: 14   Highest education level: Not on file  Occupational History   Occupation: Retired  Tobacco Use   Smoking status: Every Day   Smokeless tobacco: Never   Tobacco comments:    5  cigarettes daily  Vaping Use   Vaping Use: Never used  Substance and Sexual Activity   Alcohol use: Yes    Alcohol/week: 1.0 standard drink    Types: 1 Glasses of wine per week    Comment: Rare   Drug use: No   Sexual activity: Not Currently    Birth control/protection: Surgical    Comment: INTERCOURSE AGE 73, SEXUAL PARTNERS LESS THAN 5  Other Topics Concern   Not on file  Social History Narrative   Lives at home with husband.   Right-handed.   1 cup caffeine daily.   Social Determinants of Health   Financial Resource Strain: Not on file  Food Insecurity: Not on file  Transportation Needs: Not on file  Physical Activity: Not on file  Stress: Not on file  Social Connections: Not on file     Family History: The patient's She was adopted. Family history is unknown by patient.  ROS:   Please see the history of present illness.     All other systems reviewed and are negative.  EKGs/Labs/Other Studies Reviewed:    The following studies were reviewed today:  EKG:  EKG is  ordered today.  The ekg ordered today demonstrates  11/13/20: SR rate 69   Transthoracic  Echocardiogram: Date: 10/29/20 Results:  1. Left ventricular ejection fraction, by estimation, is 55 to 60%. The  left ventricle has normal function. The left ventricle has no regional  wall motion abnormalities. Left ventricular diastolic parameters were  normal.   2. Right ventricular systolic function is normal. The right ventricular  size is  normal.   3. Mitral inflow velocity varies ~30% with respiration. However, IVC  collapses, which is not consistent with tamponade. Image quality is  limited, but there is no clear RA/RV collapse. Suggests overall no  tamponade, but recommend clinical correlation.  Largest measured diameter 0.82 cm posterior to LV. a small pericardial  effusion is present.   4. The mitral valve is normal in structure. Trivial mitral valve  regurgitation. No evidence of mitral stenosis.   5. The aortic valve is tricuspid. There is mild calcification of the  aortic valve. Aortic valve regurgitation is trivial. Mild aortic valve  sclerosis is present, with no evidence of aortic valve stenosis.   6. The inferior vena cava is normal in size with greater than 50%  respiratory variability, suggesting right atrial pressure of 3 mmHg.   Recent Labs: 10/14/2020: TSH 0.32 10/28/2020: B Natriuretic Peptide 196.4 11/10/2020: ALT 6; BUN 13; Creat 1.49; Hemoglobin 12.1; Platelets 446; Potassium 5.2; Sodium 139  Recent Lipid Panel    Component Value Date/Time   CHOL 146 10/30/2020 0758   TRIG 147 10/30/2020 0758   HDL 35 (L) 10/30/2020 0758   CHOLHDL 4.2 10/30/2020 0758   VLDL 29 10/30/2020 0758   LDLCALC 82 10/30/2020 0758   LDLCALC 66 10/14/2020 1002    Physical Exam:    VS:  BP 110/68   Pulse 69   Ht '5\' 2"'  (1.575 m)   Wt 116 lb 9.6 oz (52.9 kg)   SpO2 98%   BMI 21.33 kg/m     Wt Readings from Last 3 Encounters:  11/13/20 116 lb 9.6 oz (52.9 kg)  11/10/20 117 lb (53.1 kg)  10/29/20 120 lb 6.4 oz (54.6 kg)     GEN:  Well nourished, well developed in no  acute distress HEENT: Normal NECK: No JVD; LYMPHATICS: No lymphadenopathy CARDIAC: RRR, no murmurs, rubs, gallops RESPIRATORY:  Clear to auscultation without rales, wheezing or rhonchi  ABDOMEN: Soft, non-tender, non-distended MUSCULOSKELETAL:  No edema; No deformity  SKIN: Warm and dry NEUROLOGIC:  Alert and oriented x 3 PSYCHIATRIC:  Normal affect   ASSESSMENT:    1. Myopericarditis   2. Pericardial effusion   3. PAF (paroxysmal atrial fibrillation) (HCC)    PLAN:    Acute myopericarditis - will get ESR/CRP in f/u - given diarrheal illness will decrease colchicine to 0.3 mg - we are attempted to wean amiodarone pending 1 week non-live ziopatch -will repeat limited echo given respirophasic changes in valve Doppler signal - continue ibuprofen for one month, may need slow taper -- Patient advised to limit physical activity to walking and light weight training (< 5kg) for at least 3 months and competitive sports for at least 6 months; patient previously did not do high intensity training  Tobacco use - given her concurrent EFFEXOR-XR use will defer cardiology start of bupropion to either PCP or [facility]; discussed the risks and benefits of the medication  Time Spent Directly with Patient:   I have spent a total of 40 minutes with the patient reviewing notes, imaging, EKGs, labs and examining the patient as well as establishing an assessment and plan that was discussed personally with the patient.  > 50% of time was spent in direct patient care and family discussing medications and their side effects.   Medication Adjustments/Labs and Tests Ordered: Current medicines are reviewed at length with the patient today.  Concerns regarding medicines are outlined above.  Orders Placed This Encounter  Procedures   High sensitivity CRP  Sedimentation rate   LONG TERM MONITOR (3-14 DAYS)   EKG 12-Lead   ECHOCARDIOGRAM LIMITED   Meds ordered this encounter  Medications    colchicine 0.6 MG tablet    Sig: Take 0.5 tablets (0.3 mg total) by mouth daily.    Dispense:  45 tablet    Refill:  1    Patient Instructions  Medication Instructions:  Your physician has recommended you make the following change in your medication:  DECREASE: colchicine to 0.3 mg by mouth daily CONTINUE: Ibuprofen 600 mg for up to 1 month  *If you need a refill on your cardiac medications before your next appointment, please call your pharmacy*   Lab Work: TODAY: ESR, CRP If you have labs (blood work) drawn today and your tests are completely normal, you will receive your results only by: [place] (if you have MyChart) OR A paper copy in the mail If you have any lab test that is abnormal or we need to change your treatment, we will call you to review the results.   Testing/Procedures: Your physician has requested that you have a Limited echocardiogram. Echocardiography is a painless test that uses sound waves to create images of your heart. It provides your doctor with information about the size and shape of your heart and how well your heart's chambers and valves are working. This procedure takes approximately one hour. There are no  restrictions for this procedure.  Your physician has requested that you wear a 7 day heart monitor.     Follow-Up: At [facility], you and your health needs are our priority.  As part of our continuing mission to provide you with exceptional heart care, we have created designated Provider Care Teams.  These Care Teams include your primary Cardiologist (physician) and Advanced Practice Providers (APPs -  Physician Assistants and Nurse Practitioners) who all work together to provide you with the care you need, when you need it.   Your next appointment:   3 month(s)  The format for your next appointment:   In Person  Provider:   You may see Werner Lean, MD or one of the following Advanced Practice Providers on your designated  Care Team:   Melina Copa, PA-C Ermalinda Barrios, PA-C   Other Instructions  Bryn Gulling- Long Term Monitor Instructions  Your physician has requested you wear a ZIO patch monitor for 14 days.  This is a single patch monitor. [employer] supplies one patch monitor per enrollment. Additional stickers are not available. Please do not apply patch if you will be having a Nuclear Stress Test,  Echocardiogram, Cardiac CT, MRI, or Chest Xray during the period you would be wearing the  monitor. The patch cannot be worn during these tests. You cannot remove and re-apply the  ZIO XT patch monitor.  Your ZIO patch monitor will be mailed 3 day USPS to your address on file. It may take 3-5 days  to receive your monitor after you have been enrolled.  Once you have received your monitor, please review the enclosed instructions. Your monitor  has already been registered assigning a specific monitor serial # to you.  Billing and Patient Assistance Program Information  We have supplied [employer] with any of your insurance information on file for billing purposes. [employer] offers a sliding scale Patient Assistance Program for patients that do not have  insurance, or whose insurance does not completely cover the cost of the ZIO monitor.  You must apply for the Patient Assistance Program  to qualify for this discounted rate.  To apply, please call [employer] at [phone], select option 4, select option 2, ask to apply for  Patient Assistance Program. Theodore Demark will ask your household income, and how many people  are in your household. They will quote your out-of-pocket cost based on that information.  [employer] will also be able to set up a 52-month interest-free payment plan if needed.  Applying the monitor   Shave hair from upper left chest.  Hold abrader disc by orange tab. Rub abrader in 40 strokes over the upper left chest as  indicated in your monitor instructions.  Clean area with 4 enclosed alcohol pads. Let dry.   Apply patch as indicated in monitor instructions. Patch will be placed under collarbone on left  side of chest with arrow pointing upward.  Rub patch adhesive wings for 2 minutes. Remove white label marked "1". Remove the white  label marked "2". Rub patch adhesive wings for 2 additional minutes.  While looking in a mirror, press and release button in center of patch. A small green light will  flash 3-4 times. This will be your only indicator that the monitor has been turned on.  Do not shower for the first 24 hours. You may shower after the first 24 hours.  Press the button if you feel a symptom. You will hear a small click. Record Date, Time and  Symptom in the Patient Logbook.  When you are ready to remove the patch, follow instructions on the last 2 pages of Patient  Logbook. Stick patch monitor onto the last page of Patient Logbook.  Place Patient Logbook in the blue and white box. Use locking tab on box and tape box closed  securely. The blue and white box has prepaid postage on it. Please place it in the mailbox as  soon as possible. Your physician should have your test results approximately 7 days after the  monitor has been [facility]  Call [employer] [phone] you have questions regarding  your ZIO XT patch monitor. Call them immediately if you see an orange light blinking on your  monitor.  If your monitor falls off in less than 4 days, contact our Monitor department at [phone]  If your monitor becomes loose or falls off after 4 days call [employer] at [phone]  suggestions on securing your monitor     Signed, MWerner Lean MD  11/13/2020 12:18 PM    [place]

## 2020-11-13 NOTE — Progress Notes (Unsigned)
Applied a 7 day Zio XT monitor to patient in office

## 2020-11-14 LAB — HIGH SENSITIVITY CRP: CRP, High Sensitivity: 27.88 mg/L — ABNORMAL HIGH (ref 0.00–3.00)

## 2020-11-14 LAB — SEDIMENTATION RATE: Sed Rate: 18 mm/hr (ref 0–40)

## 2020-11-17 ENCOUNTER — Other Ambulatory Visit: Payer: Self-pay | Admitting: Internal Medicine

## 2020-11-20 ENCOUNTER — Other Ambulatory Visit: Payer: PPO

## 2020-11-20 DIAGNOSIS — I480 Paroxysmal atrial fibrillation: Secondary | ICD-10-CM

## 2020-11-24 ENCOUNTER — Other Ambulatory Visit: Payer: Self-pay | Admitting: Nurse Practitioner

## 2020-11-24 DIAGNOSIS — Z79890 Hormone replacement therapy (postmenopausal): Secondary | ICD-10-CM

## 2020-11-25 DIAGNOSIS — I480 Paroxysmal atrial fibrillation: Secondary | ICD-10-CM | POA: Diagnosis not present

## 2020-11-25 DIAGNOSIS — F4323 Adjustment disorder with mixed anxiety and depressed mood: Secondary | ICD-10-CM | POA: Diagnosis not present

## 2020-11-27 ENCOUNTER — Ambulatory Visit (INDEPENDENT_AMBULATORY_CARE_PROVIDER_SITE_OTHER): Payer: PPO | Admitting: Internal Medicine

## 2020-11-27 ENCOUNTER — Other Ambulatory Visit: Payer: Self-pay

## 2020-11-27 VITALS — BP 128/68 | HR 67 | Ht 62.0 in | Wt 116.0 lb

## 2020-11-27 DIAGNOSIS — L259 Unspecified contact dermatitis, unspecified cause: Secondary | ICD-10-CM | POA: Diagnosis not present

## 2020-11-27 DIAGNOSIS — F32A Depression, unspecified: Secondary | ICD-10-CM

## 2020-11-27 DIAGNOSIS — R413 Other amnesia: Secondary | ICD-10-CM

## 2020-11-27 DIAGNOSIS — M48062 Spinal stenosis, lumbar region with neurogenic claudication: Secondary | ICD-10-CM | POA: Diagnosis not present

## 2020-11-27 DIAGNOSIS — R700 Elevated erythrocyte sedimentation rate: Secondary | ICD-10-CM

## 2020-11-27 DIAGNOSIS — B009 Herpesviral infection, unspecified: Secondary | ICD-10-CM

## 2020-11-27 DIAGNOSIS — Z780 Asymptomatic menopausal state: Secondary | ICD-10-CM | POA: Diagnosis not present

## 2020-11-27 DIAGNOSIS — K219 Gastro-esophageal reflux disease without esophagitis: Secondary | ICD-10-CM

## 2020-11-27 DIAGNOSIS — M8580 Other specified disorders of bone density and structure, unspecified site: Secondary | ICD-10-CM

## 2020-11-27 DIAGNOSIS — I319 Disease of pericardium, unspecified: Secondary | ICD-10-CM

## 2020-11-27 DIAGNOSIS — F419 Anxiety disorder, unspecified: Secondary | ICD-10-CM

## 2020-11-27 DIAGNOSIS — E039 Hypothyroidism, unspecified: Secondary | ICD-10-CM

## 2020-11-27 MED ORDER — TRIAMCINOLONE ACETONIDE 0.1 % EX CREA: 1.0000 "application " | TOPICAL_CREAM | Freq: Two times a day (BID) | CUTANEOUS | 0 refills | Status: DC

## 2020-11-27 NOTE — Progress Notes (Signed)
   Subjective:    Patient ID: Megan Williams, female    DOB: [birth year], 74 y.o.   MRN: [id number]  HPI 75 year old Female seen for follow up history of pericarditis diagnosed in August.  She had a CT of her chest August 30 for evaluation of chest discomfort, upper shoulder pain as well as high sed rate and was found to have a moderate sized pericardial effusion.  Ejection fraction on echocardiogram was estimated to be 55 to 60% with normal left ventricular function.  No evidence of tamponade.  Mild aortic valve sclerosis was present but no stenosis.  She was found to have atrial fibrillation and was placed on anticoagulation.  She was treated with colchicine and had hospital follow-up visit here on September 12.  Sed rate improved from 128 to 58.  She is being followed closely by Cardiology.  Sed rate is 33 and was 18 on September 15 and previously was 128 on August 23.  C-Met is normal.  Platelets are slightly high at 471,000 but previously were 661,000 in late August.  Her hemoglobin has improved from 10.1 g in early September to 12.6 g.  Creatinine has improved from 1.49 in mid September to 0.96.  She has a history of hypothyroidism and remains on levothyroxine 100 mcg daily.  She has chronic back pain.  Has had several back surgeries.  Is on Prolia for osteopenia.  Bone density study scheduled for March 2023.  History of GE reflux treated with Protonix.  History of mild memory loss treated with Namenda.    Review of Systems she denies chest pain or significant shortness of breath.  Complaining of dermatitis on chest, right arm and legs.  Says it is a bit itchy.  Considerations include drug reaction or MRSA.  Could be contact type dermatitis.     Objective:   Physical Exam Vital signs reviewed.  She looks much better than on previous visit.  Rash on chest could be contact type dermatitis based on appearance.  Chest clear.  Cardiac exam: Regular rate and rhythm without ectopy.  No lower  extremity edema.       Assessment & Plan:  Pericarditis being treated with colchicine and followed closely by [facility]  Probable contact dermatitis-we will treat with Medrol 4 mg tablet starting with 6 tablets day 1 and decreasing by 1 tablet daily.  Let me know if not improving.  History of mild memory loss treated with Namenda  History of chronic low back pain  Hypothyroidism-treated with thyroid replacement medication.  TSH to be checked in December.  We will see her in February for 75-month follow-up.

## 2020-11-28 LAB — COMPLETE METABOLIC PANEL WITH GFR
AG Ratio: 1.3 (calc) (ref 1.0–2.5)
ALT: 6 U/L (ref 6–29)
AST: 17 U/L (ref 10–35)
Albumin: 3.9 g/dL (ref 3.6–5.1)
Alkaline phosphatase (APISO): 46 U/L (ref 37–153)
BUN: 14 mg/dL (ref 7–25)
CO2: 27 mmol/L (ref 20–32)
Calcium: 9.4 mg/dL (ref 8.6–10.4)
Chloride: 101 mmol/L (ref 98–110)
Creat: 0.96 mg/dL (ref 0.60–1.00)
Globulin: 3.1 g/dL (calc) (ref 1.9–3.7)
Glucose, Bld: 78 mg/dL (ref 65–99)
Potassium: 5 mmol/L (ref 3.5–5.3)
Sodium: 136 mmol/L (ref 135–146)
Total Bilirubin: 0.4 mg/dL (ref 0.2–1.2)
Total Protein: 7 g/dL (ref 6.1–8.1)
eGFR: 62 mL/min/{1.73_m2} (ref >=60)

## 2020-11-28 LAB — CBC WITH DIFFERENTIAL/PLATELET
Absolute Monocytes: 439 cells/uL (ref 200–950)
Basophils Absolute: 43 cells/uL (ref 0–200)
Basophils Relative: 0.6 %
Eosinophils Absolute: 58 cells/uL (ref 15–500)
Eosinophils Relative: 0.8 %
HCT: 38.5 % (ref 35.0–45.0)
Hemoglobin: 12.6 g/dL (ref 11.7–15.5)
Lymphs Abs: 1872 cells/uL (ref 850–3900)
MCH: 30.7 pg (ref 27.0–33.0)
MCHC: 32.7 g/dL (ref 32.0–36.0)
MCV: 93.9 fL (ref 80.0–100.0)
MPV: 9.2 fL (ref 7.5–12.5)
Monocytes Relative: 6.1 %
Neutro Abs: 4788 cells/uL (ref 1500–7800)
Neutrophils Relative %: 66.5 %
Platelets: 471 10*3/uL — ABNORMAL HIGH (ref 140–400)
RBC: 4.1 10*6/uL (ref 3.80–5.10)
RDW: 13.7 % (ref 11.0–15.0)
Total Lymphocyte: 26 %
WBC: 7.2 10*3/uL (ref 3.8–10.8)

## 2020-11-28 LAB — SEDIMENTATION RATE: Sed Rate: 33 mm/h — ABNORMAL HIGH (ref 0–30)

## 2020-12-03 ENCOUNTER — Telehealth: Payer: Self-pay

## 2020-12-03 NOTE — Telephone Encounter (Signed)
Called pt reviewed results and MD recommendations.  She verbalizes understanding.  Amiodarone removed from med list.

## 2020-12-03 NOTE — Telephone Encounter (Signed)
-----   Message from Werner Lean, MD sent at 11/28/2020  3:11 PM EDT ----- Results: No further Afib Plan: No AC or amiodarone  Werner Lean, MD

## 2020-12-05 ENCOUNTER — Other Ambulatory Visit: Payer: Self-pay

## 2020-12-05 ENCOUNTER — Ambulatory Visit (HOSPITAL_COMMUNITY): Payer: PPO | Attending: Cardiovascular Disease

## 2020-12-05 DIAGNOSIS — I3139 Other pericardial effusion (noninflammatory): Secondary | ICD-10-CM

## 2020-12-05 DIAGNOSIS — I480 Paroxysmal atrial fibrillation: Secondary | ICD-10-CM | POA: Diagnosis not present

## 2020-12-05 LAB — ECHOCARDIOGRAM LIMITED: S' Lateral: 3.3 cm

## 2020-12-08 ENCOUNTER — Telehealth: Payer: Self-pay | Admitting: Internal Medicine

## 2020-12-08 NOTE — Telephone Encounter (Signed)
Pt c/o medication issue:  1. Name of Medication: colchicine 0.6 MG tablet  2. How are you currently taking this medication (dosage and times per day)?   3. Are you having a reaction (difficulty breathing--STAT)? NO  4. What is your medication issue? PT WANTS TO KNOW IF SHE SHOULD STILL BE TAKING THIS MEDICINE? PLEASE ADVISE PT FURTHER

## 2020-12-11 NOTE — Telephone Encounter (Signed)
Called pt and advised her of MD recommendation that she continue colchicine for a total of 3 months.  She should stop med on Jan 30, 2021.  She verbalizes understanding.  No further questions or concerns.

## 2020-12-25 ENCOUNTER — Encounter: Payer: Self-pay | Admitting: Internal Medicine

## 2020-12-25 NOTE — Patient Instructions (Addendum)
We are glad you are improving with pericarditis.  Return in February for 51-month follow-up appointment.  Continue with thyroid replacement medication and we will check TSH in December without office visit.  Have annual flu vaccine.  Recommend COVID booster.

## 2021-01-02 ENCOUNTER — Telehealth: Payer: Self-pay | Admitting: Internal Medicine

## 2021-01-02 NOTE — Telephone Encounter (Signed)
Called patient back to let her know to call ortho, she verbalized understanding

## 2021-01-02 NOTE — Telephone Encounter (Signed)
Megan Williams [phone]  Megan Williams called to say she was first having knee pain off and on, but now she is having pain all the time and she can not bend her knee

## 2021-01-05 ENCOUNTER — Telehealth: Payer: Self-pay

## 2021-01-05 NOTE — Telephone Encounter (Signed)
Patient states that she had discontinued prolia but she would  like to resume it. She would like to know if you think it is ok or if she should wait until she sees her cardiologist in December. Please advise.

## 2021-01-05 NOTE — Telephone Encounter (Signed)
Detailed message left on identifiable machine belonging to the patient. Advised to call with any questions or concerns.   

## 2021-01-26 ENCOUNTER — Encounter: Payer: Self-pay | Admitting: Internal Medicine

## 2021-01-29 ENCOUNTER — Other Ambulatory Visit: Payer: PPO | Admitting: Internal Medicine

## 2021-01-29 ENCOUNTER — Other Ambulatory Visit: Payer: Self-pay

## 2021-01-29 DIAGNOSIS — E039 Hypothyroidism, unspecified: Secondary | ICD-10-CM

## 2021-01-29 NOTE — Progress Notes (Signed)
Lab only 

## 2021-01-30 ENCOUNTER — Encounter: Payer: Self-pay | Admitting: Internal Medicine

## 2021-01-30 DIAGNOSIS — M25562 Pain in left knee: Secondary | ICD-10-CM | POA: Diagnosis not present

## 2021-01-30 LAB — TSH: TSH: 0.27 mIU/L — ABNORMAL LOW (ref 0.40–4.50)

## 2021-02-03 ENCOUNTER — Other Ambulatory Visit: Payer: Self-pay

## 2021-02-03 MED ORDER — LEVOTHYROXINE SODIUM 75 MCG PO TABS: 75.0000 ug | ORAL_TABLET | Freq: Every day | ORAL | 3 refills | Status: DC

## 2021-02-03 MED ORDER — LEVOTHYROXINE SODIUM 88 MCG PO TABS
88.0000 ug | ORAL_TABLET | Freq: Every day | ORAL | 0 refills | Status: DC
Start: 2021-02-03 — End: 2021-02-03

## 2021-02-03 NOTE — Telephone Encounter (Signed)
88 mcg d/c and 75 mcg sent in.

## 2021-02-12 NOTE — Progress Notes (Signed)
Cardiology Office Note:    Date:  02/13/2021   ID:  Megan Williams, DOB May 18, 1945, MRN [id number]  PCP:  Elby Showers, MD   [facility] Providers Cardiologist:  Werner Lean, MD     Referring MD: Elby Showers, MD   CC:  Follow up pericarditis  History of Present Illness:    Megan Williams is a 75 y.o. female with a hx of HTN seen 10/29/20 with new pericardial effusion and prodrome consistent with myopericarditis; with reactive atrial fibrillation.  Recent discharge on amiodarone (had converted through course), Ibuprofen, and colchicine.  Seen 11/13/20. Had interval echo with resolution of effusion.  Seen 02/13/21.  Patient notes that she is doing better.  She's does note some numbness in hands and feet.  Since day prior/last visit notes .  There are no interval hospital/ED visit.  No further a fib (palpitations or syncope).  No chest pain or pressure.  No SOB/DOE and no PND/Orthopnea.  No weight gain or leg swelling.    Past Medical History:  Diagnosis Date   Allergy    Anxiety    Arthritis    Complication of anesthesia    "difficulty waking up. I could hear them but I couldn't wake up"   COPD (chronic obstructive pulmonary disease) (HCC)    Depression    Emphysema    Esophagitis    Herpes simplex    Hypertension    Hypothyroidism    Migraines    Osteopenia    Vitamin D deficiency     Past Surgical History:  Procedure Laterality Date   BACK SURGERY  02/14/2018   lumbar 3-4  fusion   BREAST EXCISIONAL BIOPSY Left 1979   Benign    CATARACT EXTRACTION, BILATERAL     CERVICAL FUSION     CHOLECYSTECTOMY     KYPHOPLASTY  09/19/2015   Dr. Ellene Route  T12   LUMBAR [place] SURGERY  11/11/2017   L3-4   Thumb surg     THYROIDECTOMY     TONSILLECTOMY     VAGINAL HYSTERECTOMY      Current Medications: Current Meds  Medication Sig   Cholecalciferol (VITAMIN D) 50 MCG (2000 UT) tablet Take 2,000 Units by mouth daily.   Cyanocobalamin (B-12) 1000 MCG SUBL Place  1,000 mcg under the tongue daily.   cyclobenzaprine (FLEXERIL) 10 MG tablet Take 10 mg by mouth 3 (three) times daily as needed for muscle spasms.   denosumab (PROLIA) 60 MG/ML SOLN injection Inject 60 mg into the skin every 6 (six) months. Administer in upper arm, thigh, or abdomen   diazepam (VALIUM) 5 MG tablet Take 2.5-5 mg by mouth daily as needed for anxiety.   diphenhydrAMINE (BENADRYL) 25 MG tablet Take 25-50 mg by mouth daily as needed (hives).    estradiol (ESTRACE) 1 MG tablet Take 1 tablet (1 mg total) by mouth daily.   fenofibrate 160 MG tablet TAKE 1 TABLET BY MOUTH DAILY.   fexofenadine (ALLEGRA) 60 MG tablet Take 60 mg by mouth daily.   fluticasone (FLONASE) 50 MCG/ACT nasal spray Place 1 spray into both nostrils daily.   GEMTESA 75 MG TABS Take 75 mg by mouth daily.   levothyroxine (SYNTHROID) 75 MCG tablet Take 1 tablet (75 mcg total) by mouth daily. Please D/c the 49mcg sent earlier.   Magnesium 250 MG TABS Take 250 mg by mouth daily.   Melatonin 10 MG TABS Take 10 mg by mouth at bedtime.   memantine (NAMENDA) 10 MG  tablet Take 1 tablet (10 mg total) by mouth 2 (two) times daily. TAKE 10 mg TABLET BY MOUTH 2 TIMES DAILY.   Multiple Vitamin (MULTIVITAMIN WITH MINERALS) TABS tablet Take 2 tablets by mouth daily. One a day women's   pantoprazole (PROTONIX) 40 MG tablet TAKE 1 TABLET BY MOUTH DAILY.   RESTASIS 0.05 % ophthalmic emulsion Place 1 drop into both eyes 2 (two) times daily.   rOPINIRole (REQUIP) 0.5 MG tablet Take 0.5 mg by mouth at bedtime as needed (as directed).   traZODone (DESYREL) 100 MG tablet Take 200 mg by mouth at bedtime.   triamcinolone cream (KENALOG) 0.1 % Apply 1 application topically 2 (two) times daily.   valACYclovir (VALTREX) 500 MG tablet One po twice daily for 5 days (Patient taking differently: Take 500 mg by mouth 2 (two) times daily as needed (Fever blister/ Shingles). for 5 days)   venlafaxine XR (EFFEXOR-XR) 150 MG 24 hr capsule Take 300 mg by  mouth daily with breakfast.   [DISCONTINUED] colchicine 0.6 MG tablet Take 0.5 tablets (0.3 mg total) by mouth daily.   [DISCONTINUED] IBU 600 MG tablet TAKE 1 TABLET BY MOUTH 3 TIMES DAILY.     Allergies:   Doxycycline, Latex, Morphine, Penicillins, Grass extracts [gramineae pollens], Short ragweed pollen ext, Celebrex [celecoxib], Cephalosporins, Gabapentin, and Morphine and related   Social History   Socioeconomic History   Marital status: Married    Spouse name: Not on file   Number of children: 2   Years of education: 14   Highest education level: Not on file  Occupational History   Occupation: Retired  Tobacco Use   Smoking status: Every Day   Smokeless tobacco: Never   Tobacco comments:    5  cigarettes daily  Vaping Use   Vaping Use: Never used  Substance and Sexual Activity   Alcohol use: Yes    Alcohol/week: 1.0 standard drink    Types: 1 Glasses of wine per week    Comment: Rare   Drug use: No   Sexual activity: Not Currently    Birth control/protection: Surgical    Comment: INTERCOURSE AGE 22, SEXUAL PARTNERS LESS THAN 5  Other Topics Concern   Not on file  Social History Narrative   Lives at home with husband.   Right-handed.   1 cup caffeine daily.   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 Family history is unknown by patient.  ROS:   Please see the history of present illness.     All other systems reviewed and are negative.  EKGs/Labs/Other Studies Reviewed:    The following studies were reviewed today:  EKG:   02/13/21:   SR 85 WNL 11/13/20: SR rate 69   Transthoracic Echocardiogram: Date: 10/29/20 Results:  1. Left ventricular ejection  fraction, by estimation, is 55 to 60%. The  left ventricle has normal function. The left ventricle has no regional  wall motion abnormalities. Left ventricular diastolic parameters were  normal.   2. Right ventricular systolic function is normal. The right ventricular  size is normal.   3. Mitral inflow velocity varies ~30% with respiration. However, IVC  collapses, which is not consistent with tamponade.  Image quality is  limited, but there is no clear RA/RV collapse. Suggests overall no  tamponade, but recommend clinical correlation.  Largest measured diameter 0.82 cm posterior to LV. a small pericardial  effusion is present.   4. The mitral valve is normal in structure. Trivial mitral valve  regurgitation. No evidence of mitral stenosis.   5. The aortic valve is tricuspid. There is mild calcification of the  aortic valve. Aortic valve regurgitation is trivial. Mild aortic valve  sclerosis is present, with no evidence of aortic valve stenosis.   6. The inferior vena cava is normal in size with greater than 50%  respiratory variability, suggesting right atrial pressure of 3 mmHg.   Recent Labs: 10/28/2020: B Natriuretic Peptide 196.4 11/27/2020: ALT 6; BUN 14; Creat 0.96; Hemoglobin 12.6; Platelets 471; Potassium 5.0; Sodium 136 01/29/2021: TSH 0.27  Recent Lipid Panel    Component Value Date/Time   CHOL 146 10/30/2020 0758   TRIG 147 10/30/2020 0758   HDL 35 (L) 10/30/2020 0758   CHOLHDL 4.2 10/30/2020 0758   VLDL 29 10/30/2020 0758   LDLCALC 82 10/30/2020 0758   LDLCALC 66 10/14/2020 1002    Physical Exam:    VS:  BP 100/66    Pulse 85    Ht 5\' 2"  (1.575 m)    Wt 115 lb 3.2 oz (52.3 kg)    SpO2 97%    BMI 21.07 kg/m     Wt Readings from Last 3 Encounters:  02/13/21 115 lb 3.2 oz (52.3 kg)  11/27/20 116 lb (52.6 kg)  11/13/20 116 lb 9.6 oz (52.9 kg)    Gen: No distress  Neck: No JVD Cardiac: No Rubs or Gallops, no Murmur or rub, regular +2 radial pulses Respiratory:  Clear to auscultation bilaterally, poor effort, normal  respiratory rate GI: Soft, nontender, non-distended  MS: No  edema;  moves all extremities Integument: Skin feels warm Neuro:  At time of evaluation, alert and oriented to person/place/time/situation  Psych: Normal affect, patient feels better   ASSESSMENT:    1. Paroxysmal atrial fibrillation (HCC)   2. Hypertension, unspecified type   3. History of pericarditis     PLAN:    History of myopericarditis COPD and Tobacco use PAF without further episodes outside of acute myopericarditis HTN - will stop colchicine and scheduled ibuprofen - BP at goal - we have discussed smoking cessation - we have discussed new palpitations for Ziopatch; she would have elevated CHADSAVSC and start DOAC if new AF  Bupropion may be reasonable, no prior seizures, but would defer to Dr. Renold Genta given her Effexor- Xr  Medication Adjustments/Labs and Tests Ordered: Current medicines are reviewed at length with the patient today.  Concerns regarding medicines are outlined above.  Orders Placed This Encounter  Procedures   EKG 12-Lead    Meds ordered this encounter  Medications   ibuprofen (IBU) 600 MG tablet    Sig: Take 1 tablet (600 mg total) by mouth as needed.    Dispense:  30 tablet    Refill:  0     Patient Instructions  Medication Instructions:  Your physician recommends that you continue on your current medications as directed. Please refer to the Current Medication list given to you today.  *If you need a refill on your cardiac medications before your next appointment, please call your pharmacy*   Lab Work: NONE If you have labs (blood work) drawn today and your tests are completely normal, you will receive your results only  by: MyChart Message (if you have MyChart) OR A paper copy in the mail If you have any lab test that is abnormal or we need to change your treatment, we will call you to review the  results.   Testing/Procedures: NONE   Follow-Up: At [facility], you and your health needs are our priority.  As part of our continuing mission to provide you with exceptional heart care, we have created designated Provider Care Teams.  These Care Teams include your primary Cardiologist (physician) and Advanced Practice Providers (APPs -  Physician Assistants and Nurse Practitioners) who all work together to provide you with the care you need, when you need it.   Your next appointment:   12 month(s)  The format for your next appointment:   In Person  Provider:   Werner Lean, MD       Signed, Werner Lean, MD  02/13/2021 10:48 AM    [place]

## 2021-02-13 ENCOUNTER — Encounter: Payer: Self-pay | Admitting: Internal Medicine

## 2021-02-13 ENCOUNTER — Other Ambulatory Visit: Payer: Self-pay

## 2021-02-13 ENCOUNTER — Ambulatory Visit (INDEPENDENT_AMBULATORY_CARE_PROVIDER_SITE_OTHER): Payer: PPO | Admitting: Internal Medicine

## 2021-02-13 VITALS — BP 100/66 | HR 85 | Ht 62.0 in | Wt 115.2 lb

## 2021-02-13 DIAGNOSIS — Z8679 Personal history of other diseases of the circulatory system: Secondary | ICD-10-CM | POA: Insufficient documentation

## 2021-02-13 DIAGNOSIS — I10 Essential (primary) hypertension: Secondary | ICD-10-CM

## 2021-02-13 DIAGNOSIS — I480 Paroxysmal atrial fibrillation: Secondary | ICD-10-CM | POA: Diagnosis not present

## 2021-02-13 MED ORDER — IBUPROFEN 600 MG PO TABS
600.0000 mg | ORAL_TABLET | ORAL | 0 refills | Status: DC | PRN
Start: 2021-02-13 — End: 2021-03-12

## 2021-02-13 NOTE — Patient Instructions (Signed)
Medication Instructions:  Your physician recommends that you continue on your current medications as directed. Please refer to the Current Medication list given to you today.  *If you need a refill on your cardiac medications before your next appointment, please call your pharmacy*   Lab Work: NONE If you have labs (blood work) drawn today and your tests are completely normal, you will receive your results only by: [place] (if you have MyChart) OR A paper copy in the mail If you have any lab test that is abnormal or we need to change your treatment, we will call you to review the results.   Testing/Procedures: NONE   Follow-Up: At [facility], you and your health needs are our priority.  As part of our continuing mission to provide you with exceptional heart care, we have created designated Provider Care Teams.  These Care Teams include your primary Cardiologist (physician) and Advanced Practice Providers (APPs -  Physician Assistants and Nurse Practitioners) who all work together to provide you with the care you need, when you need it.   Your next appointment:   12 month(s)  The format for your next appointment:   In Person  Provider:   Werner Lean, MD

## 2021-02-14 IMAGING — CT CT L SPINE W/O CM
1 of 7 series · 5 of 14 positions shown, 7 images · non-contrast
Comparison: 02/09/2011

CLINICAL DATA: Lumbar pain with bowel and bladder incontinence.

EXAM:
CT LUMBAR SPINE WITHOUT CONTRAST
TECHNIQUE: Multidetector CT imaging of the lumbar spine was performed without
intravenous contrast administration. Multiplanar CT image
reconstructions were also generated.

[Series 3: l spine soft · axial · 0.31mm/px · z∈[-250,-94]mm · 5 of 78 slices shown, 7 images]
[im 13/78  soft-tissue]
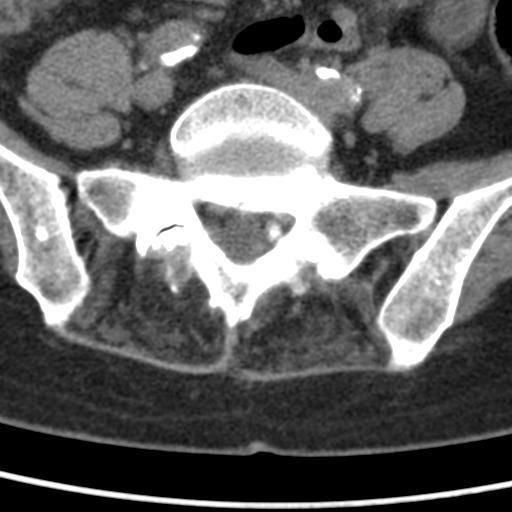
[im 13/78  bone]
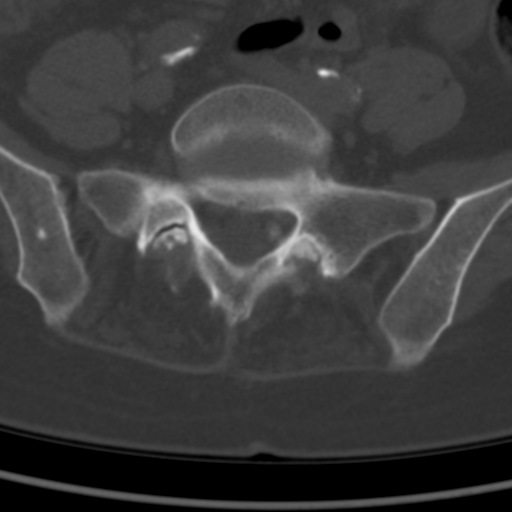
[im 26/78  bone]
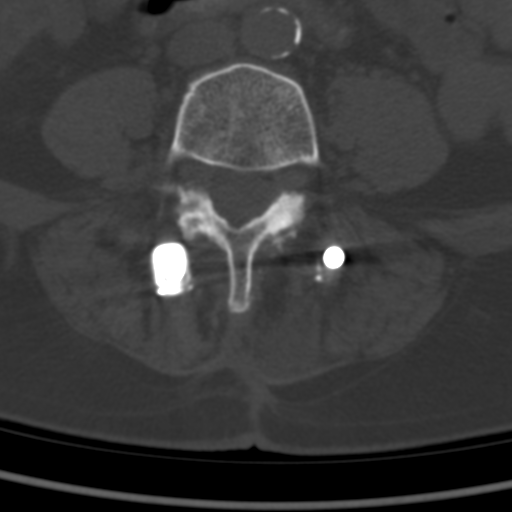
[im 39/78  bone]
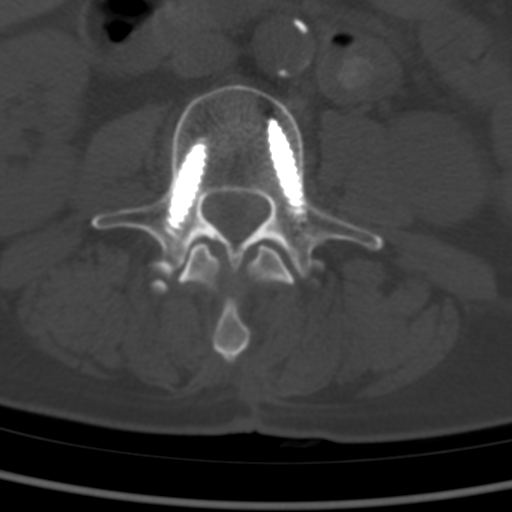
[im 52/78  bone]
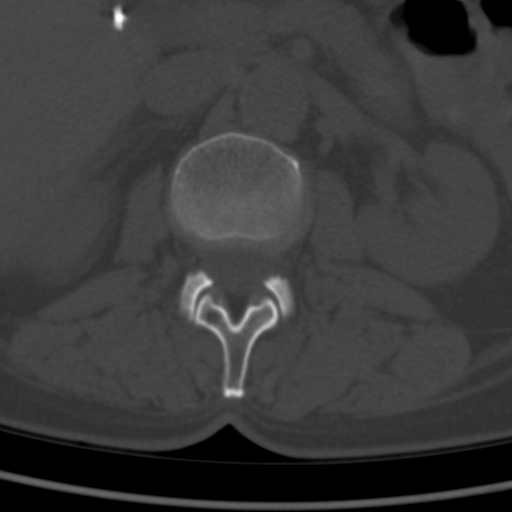
[im 65/78  soft-tissue]
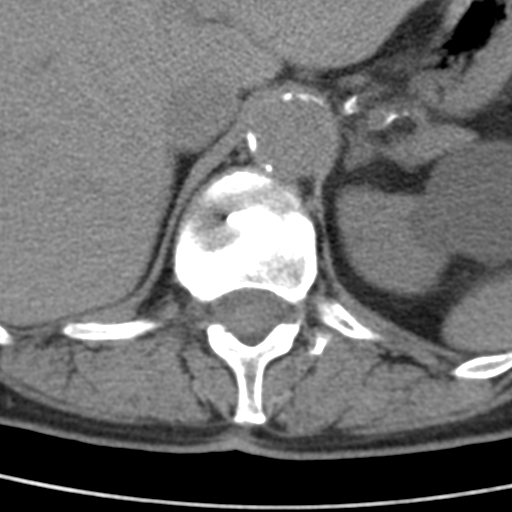
[im 65/78  bone]
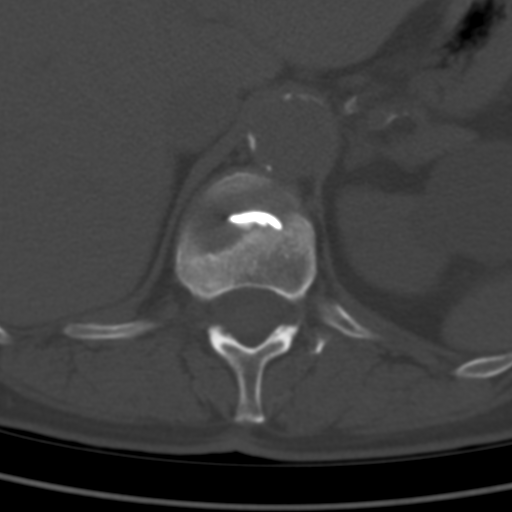

[5 of 14 positions shown; findings below may reference images not displayed]

FINDINGS: Segmentation: 5 lumbar type vertebrae.

Alignment: Normal.

Vertebrae: No acute fracture or subluxation. Old T12 vertebral body
compression fracture with 60% anterior height loss. Prior T12
vertebral body augmentation with methylmethacrylate within the T12
vertebral body with mild intravasation into the venous plexus. No
aggressive osseous lesion. No discitis or osteomyelitis.

Paraspinal and other soft tissues: Abdominal aortic atherosclerosis.
No acute paraspinal abnormality.

Disc levels:

Posterior lumbar interbody fusion from L3 through L5 degenerative
disc disease with disc height loss at T12-L1.

T12-L1: No disc protrusion, foraminal stenosis or central canal
stenosis.

L1-L2: No disc protrusion, foraminal stenosis or central canal
stenosis.

L2-L3: Mild broad-based disc bulge. Mild bilateral facet
arthropathy. Mild-moderate spinal stenosis. Mild left foraminal
stenosis.

L3-L4: Interbody fusion. No foraminal or definite central canal
stenosis.

L4-L5: Interbody fusion. No foraminal or definite central canal
stenosis.

L5-S1: Broad-based disc bulge. Severe bilateral facet arthropathy.
No foraminal stenosis.
IMPRESSION: 1. Posterior lumbar interbody fusion at L3-L5 without recurrent
foraminal stenosis. No definite central canal stenosis.
2. Old T12 vertebral body compression fracture with 60% anterior
height loss. Prior T12 vertebral body augmentation with
methylmethacrylate within the T12 vertebral body with mild
intravasation into the venous plexus. No aggressive osseous lesion.
3.  Aortic Atherosclerosis ([id number]-017.7).

## 2021-03-09 ENCOUNTER — Other Ambulatory Visit: Payer: Medicare HMO | Admitting: Internal Medicine

## 2021-03-09 ENCOUNTER — Other Ambulatory Visit: Payer: Self-pay

## 2021-03-09 DIAGNOSIS — R5383 Other fatigue: Secondary | ICD-10-CM | POA: Diagnosis not present

## 2021-03-09 DIAGNOSIS — R7989 Other specified abnormal findings of blood chemistry: Secondary | ICD-10-CM | POA: Diagnosis not present

## 2021-03-10 LAB — TSH: TSH: 1.95 mIU/L (ref 0.40–4.50)

## 2021-03-12 ENCOUNTER — Other Ambulatory Visit: Payer: Self-pay

## 2021-03-12 ENCOUNTER — Encounter: Payer: Self-pay | Admitting: Internal Medicine

## 2021-03-12 ENCOUNTER — Ambulatory Visit (INDEPENDENT_AMBULATORY_CARE_PROVIDER_SITE_OTHER): Payer: Medicare HMO | Admitting: Internal Medicine

## 2021-03-12 VITALS — BP 108/72 | HR 72 | Temp 97.6°F | Ht 62.0 in | Wt 119.5 lb

## 2021-03-12 DIAGNOSIS — E039 Hypothyroidism, unspecified: Secondary | ICD-10-CM

## 2021-03-12 DIAGNOSIS — F32A Depression, unspecified: Secondary | ICD-10-CM

## 2021-03-12 DIAGNOSIS — Z780 Asymptomatic menopausal state: Secondary | ICD-10-CM

## 2021-03-12 DIAGNOSIS — G2581 Restless legs syndrome: Secondary | ICD-10-CM

## 2021-03-12 DIAGNOSIS — F419 Anxiety disorder, unspecified: Secondary | ICD-10-CM

## 2021-03-12 DIAGNOSIS — M48062 Spinal stenosis, lumbar region with neurogenic claudication: Secondary | ICD-10-CM

## 2021-03-12 DIAGNOSIS — K219 Gastro-esophageal reflux disease without esophagitis: Secondary | ICD-10-CM

## 2021-03-12 DIAGNOSIS — G8929 Other chronic pain: Secondary | ICD-10-CM

## 2021-03-12 DIAGNOSIS — Z8679 Personal history of other diseases of the circulatory system: Secondary | ICD-10-CM

## 2021-03-12 DIAGNOSIS — R413 Other amnesia: Secondary | ICD-10-CM

## 2021-03-12 DIAGNOSIS — M8580 Other specified disorders of bone density and structure, unspecified site: Secondary | ICD-10-CM

## 2021-03-12 DIAGNOSIS — M545 Low back pain, unspecified: Secondary | ICD-10-CM | POA: Diagnosis not present

## 2021-03-12 NOTE — Progress Notes (Signed)
° °  Subjective:    Patient ID: Megan Williams, female    DOB: [birth year], 76 y.o.   MRN: [id number]  HPI Patient is unhappy with her weight. Gained 3.5 pounds since  September.  Patient developed pericarditis back in the Fall. Did well after brief hospitalization.  Followed by cardiology.  Hx hypothyroidism. TSH checked here is normal on thyroid replacement  therapy. Her back issue is stable.   EF 55% to 60% on ECHO in August.  Off Wellbutrin at present. Will see Noemi Chapel in March. Still on Effexor. Does not seem depressed at present time but frustrated with her weight.  Reviewed labs from September and they were stable.  TSH was low in December but is now normal at 1.95.  Her dose was decreased in December from 0.088 mg to 0.075 mg daily.  Sees Neurology in late March. Hx of mild memory loss.  Does not want Covid booster. Had flu vaccine in December.  Review of Systems see above     Objective:   Physical Exam Blood pressure 108/72 pulse 72 temperature 97.6 degrees pulse oximetry 98% Weight 119 pounds BMI 21.86 Skin is warm and dry.  No cervical adenopathy.  No thyromegaly.  No carotid bruits.  Chest is clear to auscultation.  Cardiac exam:RRR no friction rub. No LE edema. Mild memory loss noted.      Assessment & Plan:  Hx pericarditis- resolved- Had PAF with this but has resolved Mild Memory loss-treated with Namenda Restless leg syndrome- treated with Requip Chronic back pain- has Valium prn GERD- treated with Protonix and stable Anxiety treated with Valium which also helps back pain Hypothyroidism stable on thyroid replacement. Hyperlipidemia treated with fenofibrate Osteopenia treated with Prolia- bone density 2020 Depression treated with Desyrel  Plan: Sees GYN in June and will see me for medicare wellness and health maintenance inAugust Hypothyroidism- stable on thyroid replacement. Dose was recently decreased and TSH is now normal. Anxiety and depression- seen by  Noemi Chapel

## 2021-03-24 ENCOUNTER — Other Ambulatory Visit: Payer: Self-pay | Admitting: Internal Medicine

## 2021-03-24 DIAGNOSIS — N3281 Overactive bladder: Secondary | ICD-10-CM | POA: Diagnosis not present

## 2021-03-24 DIAGNOSIS — N319 Neuromuscular dysfunction of bladder, unspecified: Secondary | ICD-10-CM | POA: Diagnosis not present

## 2021-03-24 DIAGNOSIS — B009 Herpesviral infection, unspecified: Secondary | ICD-10-CM

## 2021-03-31 NOTE — Patient Instructions (Signed)
It was a pleasure to see you today.  Continue current medications and follow-up in September for CPE and Medicare wellness visit.

## 2021-04-02 ENCOUNTER — Telehealth: Payer: Self-pay

## 2021-04-02 NOTE — Telephone Encounter (Signed)
Patient has had a headache, sore throat and is very tired x 2 weeks. No fever, thinks its a sinus infection. She took a covid test yesterday and it was negative. Not getting any better

## 2021-04-02 NOTE — Telephone Encounter (Signed)
Schedule appointment?

## 2021-04-02 NOTE — Telephone Encounter (Signed)
Called and patient said she has not been anywhere, when she does go out she wears her mask.

## 2021-04-03 ENCOUNTER — Encounter: Payer: Self-pay | Admitting: Internal Medicine

## 2021-04-03 ENCOUNTER — Other Ambulatory Visit: Payer: Self-pay

## 2021-04-03 ENCOUNTER — Ambulatory Visit (INDEPENDENT_AMBULATORY_CARE_PROVIDER_SITE_OTHER): Payer: Medicare HMO | Admitting: Internal Medicine

## 2021-04-03 VITALS — BP 82/62 | HR 62 | Temp 97.3°F

## 2021-04-03 DIAGNOSIS — J0100 Acute maxillary sinusitis, unspecified: Secondary | ICD-10-CM | POA: Diagnosis not present

## 2021-04-03 DIAGNOSIS — R058 Other specified cough: Secondary | ICD-10-CM | POA: Diagnosis not present

## 2021-04-03 DIAGNOSIS — J029 Acute pharyngitis, unspecified: Secondary | ICD-10-CM | POA: Diagnosis not present

## 2021-04-03 LAB — POCT RAPID STREP A (OFFICE): Rapid Strep A Screen: NEGATIVE

## 2021-04-03 MED ORDER — CYCLOBENZAPRINE HCL 10 MG PO TABS: 10.0000 mg | ORAL_TABLET | Freq: Every day | ORAL | 0 refills | Status: DC

## 2021-04-03 MED ORDER — AZITHROMYCIN 250 MG PO TABS: ORAL_TABLET | ORAL | 0 refills | Status: AC

## 2021-04-03 NOTE — Patient Instructions (Signed)
Respiratory virus panel obtained.  May use Neosporin ointment in nostrils to clear out debris twice daily.  Take Zithromax Z-PAK 2 tabs day 1 followed by 1 tab days 2 through 5 for respiratory congestion.  Strep screen is negative today.  Rest and drink fluids.

## 2021-04-03 NOTE — Progress Notes (Signed)
° °  Subjective:    Patient ID: Megan Williams, female    DOB: Oct 19, 1945, 76 y.o.   MRN: [id number]  HPI Patient has respiratory congestion that stays in the back of her throat. Can't get it out she says. Covid test at home was negative.  She has a history of pericarditis and paroxysmal atrial fibrillation followed by cardiology.  This developed in the Fall 2022.  She has chronic back pain, GE reflux, restless leg syndrome, hypertriglyceridemia and hypothyroidism.  History of anxiety and osteoporosis.  Remote history of COVID-19 in 2021.  Took COVID test yesterday and reported it is negative.  Review of Systems complains of headache, sore throat, fatigue for 2 weeks.     Objective:   Physical Exam Blood pressure low at 82/62 pulse 62 temperature 97.3 degrees by ear thermometer pulse oximetry 97%  TMs are clear.  Pharynx is not injected.  Neck supple.  Chest is clear.       Assessment & Plan:   My feeling is that she has acute maxillary sinusitis.  She tested negative at home for COVID-19.  Respiratory virus panel was obtained and proved also to be negative.  Rapid strep screen was negative today.  Plan: Patient was given Zithromax Z-PAK 2 tabs day 1 followed by 1 tab days 2 through 5.  Is to rest at home and drink plenty of fluids and let me know if not better in 7 to 10 days or sooner if worse.

## 2021-04-06 LAB — RESPIRATORY VIRUS PANEL

## 2021-04-20 ENCOUNTER — Other Ambulatory Visit: Payer: PPO | Admitting: Internal Medicine

## 2021-04-21 ENCOUNTER — Ambulatory Visit: Payer: PPO | Admitting: Internal Medicine

## 2021-05-01 ENCOUNTER — Other Ambulatory Visit: Payer: Self-pay | Admitting: Internal Medicine

## 2021-05-01 DIAGNOSIS — I509 Heart failure, unspecified: Secondary | ICD-10-CM | POA: Diagnosis not present

## 2021-05-01 DIAGNOSIS — M25562 Pain in left knee: Secondary | ICD-10-CM | POA: Diagnosis not present

## 2021-05-05 ENCOUNTER — Ambulatory Visit
Admission: RE | Admit: 2021-05-05 | Discharge: 2021-05-05 | Disposition: A | Discharge status: Home / Self Care | Payer: Medicare HMO | Source: Ambulatory Visit | Attending: Internal Medicine | Admitting: Internal Medicine

## 2021-05-05 DIAGNOSIS — M85852 Other specified disorders of bone density and structure, left thigh: Secondary | ICD-10-CM | POA: Diagnosis not present

## 2021-05-05 DIAGNOSIS — M8580 Other specified disorders of bone density and structure, unspecified site: Secondary | ICD-10-CM

## 2021-05-05 DIAGNOSIS — Z780 Asymptomatic menopausal state: Secondary | ICD-10-CM

## 2021-05-05 DIAGNOSIS — M48062 Spinal stenosis, lumbar region with neurogenic claudication: Secondary | ICD-10-CM

## 2021-05-05 LAB — HM DEXA SCAN

## 2021-05-13 DIAGNOSIS — E559 Vitamin D deficiency, unspecified: Secondary | ICD-10-CM | POA: Diagnosis not present

## 2021-05-13 DIAGNOSIS — M810 Age-related osteoporosis without current pathological fracture: Secondary | ICD-10-CM | POA: Diagnosis not present

## 2021-05-13 DIAGNOSIS — R5383 Other fatigue: Secondary | ICD-10-CM | POA: Diagnosis not present

## 2021-05-26 DIAGNOSIS — F411 Generalized anxiety disorder: Secondary | ICD-10-CM | POA: Diagnosis not present

## 2021-05-26 DIAGNOSIS — F429 Obsessive-compulsive disorder, unspecified: Secondary | ICD-10-CM | POA: Diagnosis not present

## 2021-05-26 DIAGNOSIS — F3342 Major depressive disorder, recurrent, in full remission: Secondary | ICD-10-CM | POA: Diagnosis not present

## 2021-05-27 ENCOUNTER — Encounter: Payer: Self-pay | Admitting: Neurology

## 2021-05-27 ENCOUNTER — Ambulatory Visit: Payer: Medicare HMO | Admitting: Neurology

## 2021-05-27 VITALS — BP 133/83 | HR 73 | Ht 62.0 in | Wt 119.0 lb

## 2021-05-27 DIAGNOSIS — G3184 Mild cognitive impairment, so stated: Secondary | ICD-10-CM

## 2021-05-27 DIAGNOSIS — G250 Essential tremor: Secondary | ICD-10-CM | POA: Diagnosis not present

## 2021-05-27 MED ORDER — PROPRANOLOL HCL 40 MG PO TABS: 40.0000 mg | ORAL_TABLET | Freq: Two times a day (BID) | ORAL | 3 refills | Status: DC

## 2021-05-27 MED ORDER — MEMANTINE HCL 10 MG PO TABS: 10.0000 mg | ORAL_TABLET | Freq: Two times a day (BID) | ORAL | 4 refills | Status: DC

## 2021-05-27 NOTE — Progress Notes (Signed)
? ? ?PATIENT: Megan Williams ?DOB: [birth year] ? ?HISTORY Megan Williams): Megan Williams 76 years old right-handed female, seen in refer by her primary care physician Dr. Tedra Williams for evaluation of tremor, memory loss, and gait problems   ?  ?She had a history of hypertension, depression, anxiety, used to work as a Radio broadcast assistant, Dance movement psychotherapist ?  ?Around 2015, she began to notice memory trouble, she needs family to remind her multiple times, tends to forget people's name, phone number, she used to be able to remember all the congregation's name in the past, her memory trouble since 2 gradually getting worse, she still driving without getting lost ?  ?She reported history of migraine since young, for a while in September to October 2015, she has migraines almost on a daily basis, which has improved after stopped taking Trileptal ?  ?She also reported mild bilateral hands tremor since 2014, most noticeable when she holding a utensil, or write with a pencil, she also noticed mild bilateral hands weakness, in Thanksgiving 2015, she has dropped her dishes to the floor, because of bilateral hands weakness, ?  ?Around 2015, she also noticed mild stiff unsteady gait, worsening urinary urgency, she denies significant neck pain, complains of moderate low back pain, she denies bilateral upper or lower extremity paresthesia ?  ?She is adopted, does not know family history, none of her children has tremor ?  ?MRI brain film in November 2016, mild generalized atrophy, mild supratentorium small vessel disease,  ?  ?MRI of the cervical spine showed evidence of previous fusion from C4-7, 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 ?  ?UPDATE October 27 2016: ?We reviewed the laboratory evaluation in August 2018, A1c was 4.9, normal folic acid, vitamin W40, TSH, fasting lipid profile, triglycerides 198, LDL 85, CMP showed creatinine of 1.0, CBC showed MCV of 105, ?  ?She has retired, continue combating  depression, not memory loss, especially when she is stressed, she is on polypharmacy treatment, Effexor, Wellbutrin, Xanax as needed trazodone as needed for sleep ?  ?She also has bilateral hands tremor, mild gait abnormality due to low back pain ?  ?Update May 23, 2017: ?She still noticed mild memory loss, but overall doing well. She is now retired, Tour manager, [place] Status Examination was 30 out of 30 today, still on polypharmacy treatment, trazodone, Effexor, Xanax, Wellbutrin ?  ?UPDATE Sept 14 2020: ?She continues to complain of worsening bilateral hands tremor, which started since 2001, she chronic insomnia, chronic, half tablet of Xanax for treatment, ?Her memory loss has been stable, continue to be active, taking care of  [employer]  ? ?Update May 22, 2019 SS: When last seen by Dr. [name], propanolol 40 mg twice a day was added for essential tremor.  Tremor is much improved, much easier when typing, has really seen a big difference overall.  Her memory is stable, her husband says she has repetitive questioning, but reports a hearing issue.  Her mother had dementia.  She remains active, looks forward to less pandemic restriction, has 3 boston terriers. MMSE 30/30. ? ?Update May 21, 2020 SS: Tremor is better when taking propranolol, didn't take this morning, 75% helpful. Good and bad days with memory, MMSE 30/30. On a bad day, a bad day for memory is triggered by back pain. Isn't driving right now due to pain. Underwent, lumbar decompression surgery at L2-3 05/01/20 with Dr. Ellene Williams earlier this month. Going to PT/OT. So  far surgery, has helped the left sided back pain, nothing on the right. Gabapentin didn't help, taking Dilaudid for pain.  ? ?Update May 27, 2021 SS: Here today alone, MMSE 30/30 today. Memory doing well, some moments of forgetfulness, will come to her. Taking propranolol intermittently, just restarted this week, in both hands, more stress, very close friend passed away.  Husband is seeing hematology this week.  ? ?REVIEW OF SYSTEMS: Out of a complete 14 system review of symptoms, the patient complains only of the following symptoms, and all other reviewed systems are negative. ? ?See HPI ? ?ALLERGIES: ?Allergies  ?Allergen Reactions  ? Doxycycline Other (See Comments) and Hives  ?  Skin peeling ? ?Other reaction(s): Stevens-Johnson Syndrome  ? Latex Rash, Hives and Itching  ? Morphine Itching and Other (See Comments)  ?  Other reaction(s): Confusion, Dizziness  ? Penicillins Hives, Diarrhea and Nausea Only  ?  Has patient had a PCN reaction causing immediate rash, facial/tongue/throat swelling, SOB or lightheadedness with hypotension: No ?Has patient had a PCN reaction causing severe rash involving mucus membranes or skin necrosis: No ?Has patient had a PCN reaction that required hospitalization: No ?Has patient had a PCN reaction occurring within the last 10 years: No ?If all of the above answers are "NO", then may proceed with Cephalosporin use. ?Other reaction(s): Abdominal Pain  ? Grass Extracts [Gramineae Pollens] Cough and Itching  ?  Other reaction(s): Eye Redness  ? Short Ragweed Pollen Ext Cough and Itching  ?  Other reaction(s): Eye Redness, Other  ? Celebrex [Celecoxib] Other (See Comments)  ?  Blistering, pain, swelling  ? Cephalosporins Nausea Only  ?  Diarrhea, hives  ? Gabapentin   ?  Causes TICS ?  ? Morphine And Related Itching  ? ? ?HOME MEDICATIONS: ?Outpatient Medications Prior to Visit  ?Medication Sig Dispense Refill  ? Cholecalciferol (VITAMIN D) 50 MCG (2000 UT) tablet Take 2,000 Units by mouth daily.    ? Cyanocobalamin (B-12) 1000 MCG SUBL Place 1,000 mcg under the tongue daily.    ? cyclobenzaprine (FLEXERIL) 10 MG tablet Take 1 tablet (10 mg total) by mouth at bedtime. 30 tablet 0  ? denosumab (PROLIA) 60 MG/ML SOLN injection Inject 60 mg into the skin every 6 (six) months. Administer in upper arm, thigh, or abdomen    ? diazepam (VALIUM) 5 MG tablet Take  2.5-5 mg by mouth daily as needed for anxiety.    ? estradiol (ESTRACE) 1 MG tablet Take 1 tablet (1 mg total) by mouth daily. 90 tablet 3  ? fenofibrate 160 MG tablet TAKE 1 TABLET BY MOUTH DAILY. 90 tablet 1  ? fexofenadine (ALLEGRA) 60 MG tablet Take 60 mg by mouth daily.    ? fluticasone (FLONASE) 50 MCG/ACT nasal spray Place 1 spray into both nostrils daily.    ? GEMTESA 75 MG TABS Take 1 tablet by mouth daily.    ? levothyroxine (SYNTHROID) 75 MCG tablet Take 1 tablet (75 mcg total) by mouth daily. Please D/c the 34mg sent earlier. 90 tablet 3  ? Magnesium 250 MG TABS Take 250 mg by mouth daily.    ? Melatonin 10 MG TABS Take 10 mg by mouth at bedtime.    ? memantine (NAMENDA) 10 MG tablet Take 1 tablet (10 mg total) by mouth 2 (two) times daily. TAKE 10 mg TABLET BY MOUTH 2 TIMES DAILY. 180 tablet 4  ? pantoprazole (PROTONIX) 40 MG tablet TAKE 1 TABLET BY MOUTH DAILY. 90 tablet 3  ?  propranolol (INDERAL) 40 MG tablet Take 40 mg by mouth 3 (three) times daily.    ? RESTASIS 0.05 % ophthalmic emulsion Place 1 drop into both eyes 2 (two) times daily.    ? rOPINIRole (REQUIP) 0.5 MG tablet Take 0.5 mg by mouth at bedtime as needed (as directed).    ? traZODone (DESYREL) 100 MG tablet Take 200 mg by mouth at bedtime.    ? triamcinolone cream (KENALOG) 0.1 % Apply 1 application topically 2 (two) times daily. 30 g 0  ? valACYclovir (VALTREX) 500 MG tablet TAKE 1 TABLET BY MOUTH TWICE A DAY FOR 5 DAYS 10 tablet 5  ? venlafaxine XR (EFFEXOR-XR) 150 MG 24 hr capsule Take 300 mg by mouth daily with breakfast.    ? oxybutynin (DITROPAN) 5 MG tablet Take 5 mg by mouth 2 (two) times daily.    ? ?No facility-administered medications prior to visit.  ? ? ?PAST MEDICAL HISTORY: ?Past Medical History:  ?Diagnosis Date  ? Allergy   ? Anxiety   ? Arthritis   ? Complication of anesthesia   ? "difficulty waking up. I could hear them but I couldn't wake up"  ? COPD (chronic obstructive pulmonary disease) ([place])   ? Depression   ?  Emphysema   ? Esophagitis   ? Herpes simplex   ? Hypertension   ? Hypothyroidism   ? Migraines   ? Osteopenia   ? Vitamin D deficiency   ? ? ?PAST SURGICAL HISTORY: ?Past Surgical History:  ?Procedure Lat

## 2021-05-27 NOTE — Patient Instructions (Signed)
Memory looks good  ?Continue current medications ?See you back in 1 year  ?

## 2021-06-02 DIAGNOSIS — M810 Age-related osteoporosis without current pathological fracture: Secondary | ICD-10-CM | POA: Diagnosis not present

## 2021-06-02 DIAGNOSIS — M545 Low back pain, unspecified: Secondary | ICD-10-CM | POA: Diagnosis not present

## 2021-06-03 ENCOUNTER — Encounter: Payer: Self-pay | Admitting: Neurology

## 2021-06-04 ENCOUNTER — Encounter: Payer: Self-pay | Admitting: Internal Medicine

## 2021-06-04 ENCOUNTER — Ambulatory Visit (INDEPENDENT_AMBULATORY_CARE_PROVIDER_SITE_OTHER): Payer: Medicare HMO | Admitting: Internal Medicine

## 2021-06-04 ENCOUNTER — Telehealth: Payer: Self-pay | Admitting: Internal Medicine

## 2021-06-04 DIAGNOSIS — F32A Depression, unspecified: Secondary | ICD-10-CM | POA: Diagnosis not present

## 2021-06-04 DIAGNOSIS — E039 Hypothyroidism, unspecified: Secondary | ICD-10-CM

## 2021-06-04 DIAGNOSIS — Z8679 Personal history of other diseases of the circulatory system: Secondary | ICD-10-CM | POA: Diagnosis not present

## 2021-06-04 DIAGNOSIS — F419 Anxiety disorder, unspecified: Secondary | ICD-10-CM | POA: Diagnosis not present

## 2021-06-04 DIAGNOSIS — H81319 Aural vertigo, unspecified ear: Secondary | ICD-10-CM | POA: Diagnosis not present

## 2021-06-04 DIAGNOSIS — R42 Dizziness and giddiness: Secondary | ICD-10-CM

## 2021-06-04 DIAGNOSIS — R413 Other amnesia: Secondary | ICD-10-CM
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
## 2021-06-11 ENCOUNTER — Ambulatory Visit (INDEPENDENT_AMBULATORY_CARE_PROVIDER_SITE_OTHER): Payer: Medicare HMO | Admitting: Internal Medicine

## 2021-06-11 ENCOUNTER — Other Ambulatory Visit: Payer: Self-pay

## 2021-06-11 ENCOUNTER — Encounter: Payer: Self-pay | Admitting: Internal Medicine

## 2021-06-11 VITALS — BP 106/68 | HR 70 | Temp 97.7°F

## 2021-06-11 DIAGNOSIS — R42 Dizziness and giddiness: Secondary | ICD-10-CM

## 2021-06-11 NOTE — Progress Notes (Signed)
? ?  Subjective:  ? ? Patient ID: Megan Williams, female    DOB: Mar 25, 1945, 76 y.o.   MRN: [id number] ? ?HPI Here for follow up on vertigo. I had a telephone visit with her on April 6th and asked that she come in today for office visit and examination.  Please see detailed 06/05/2018 phone visit.  Has had several episodes of vertigo 1 of which when she was driving.  She does have Valium on hand but has not been taking it on a regular basis.  Continues since telephone call on April 6 to have intermittent episodes of vertigo but not severe.  She does have situational stress.  Husband has been undergoing some medical tests.  She says that his best friend passed away and that was hard.  Is concerned about her daughter who is married to an alcoholic.  She does sees Megan Williams at [facility] for counseling. ?Patient has history of essential tremor ,memory loss, and gait problems and is followed by  Megan Williams at [facility]. ? ?Saw Dr. [name] in 2016 and had an MRI of the brain without contrast showing mild changes of small vessel disease and generalized cerebral atrophy.  Chronic ethmoid and maxillary sinusitis noted. ? ? ?Review of Systems no chest pain SOB or palpitations.  No headaches or syncope. ? ?   ?Objective:  ? Physical Exam ?Blood pressure today is 106/68 pulse 70 pulse oximetry 96% ? ?Right pupil is slightly larger than the left pupil.  PERRLA.  Extraocular movements are full.  No nystagmus.  Chest is clear.  Cardiac exam: Regular rate and rhythm without ectopy.  Cranial nerves II through XII are grossly intact.  Cannot do toe walking well or tandem walking.  Has history of gait disturbance.  She is alert and oriented today.  Able to give a clear concise history.  She has an essential tremor. ? ? ? ?   ?Assessment & Plan:  ?Essential tremor ? ?History of mild memory loss ? ?History of myopericarditis-being followed by cardiologist ? ?History of acute maxillary sinusitis treated in February and  resolved ? ?History of hypothyroidism-TSH in January was normal ? ?History of paroxysmal atrial fibrillation followed by cardiologist ? ?History of smoking ? ?Plan: Referral back to Megan Williams for evaluation of episodes of vertigo at times severe.  Have asked patient to take Valium 5 mg daily not just on a as needed basis.  Likely needs neuroimaging. ? ?

## 2021-06-11 NOTE — Patient Instructions (Signed)
Referral to Neurology for evaluation of vertigo. ?

## 2021-06-21 ENCOUNTER — Encounter: Payer: Self-pay | Admitting: Internal Medicine
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
## 2021-06-21 NOTE — Patient Instructions (Signed)
Patient will be referred to [facility] for evaluation of vertigo. ?

## 2021-06-22 ENCOUNTER — Other Ambulatory Visit: Payer: Self-pay | Admitting: Internal Medicine

## 2021-07-02 ENCOUNTER — Ambulatory Visit: Payer: Medicare HMO | Admitting: Neurology

## 2021-07-02 ENCOUNTER — Telehealth: Payer: Self-pay | Admitting: Neurology

## 2021-07-02 ENCOUNTER — Encounter: Payer: Self-pay | Admitting: Neurology

## 2021-07-02 VITALS — BP 131/78 | HR 69 | Ht 62.0 in | Wt 126.0 lb

## 2021-07-02 DIAGNOSIS — R42 Dizziness and giddiness: Secondary | ICD-10-CM | POA: Diagnosis not present

## 2021-07-02 DIAGNOSIS — G3184 Mild cognitive impairment, so stated: Secondary | ICD-10-CM | POA: Diagnosis not present

## 2021-07-02 DIAGNOSIS — H9190 Unspecified hearing loss, unspecified ear: Secondary | ICD-10-CM | POA: Diagnosis not present
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
## 2021-07-02 NOTE — Telephone Encounter (Signed)
[employer] Josem Kaufmann: [phone] exp. 07/02/21-08/01/21 sent to GI they will call to schedule ?

## 2021-07-12 ENCOUNTER — Ambulatory Visit
Admission: RE | Admit: 2021-07-12 | Discharge: 2021-07-12 | Disposition: A | Discharge status: Home / Self Care | Payer: Medicare HMO | Source: Ambulatory Visit | Attending: Neurology | Admitting: Neurology

## 2021-07-12 DIAGNOSIS — H9190 Unspecified hearing loss, unspecified ear: Secondary | ICD-10-CM

## 2021-07-12 DIAGNOSIS — R42 Dizziness and giddiness: Secondary | ICD-10-CM

## 2021-07-12 DIAGNOSIS — G3184 Mild cognitive impairment, so stated: Secondary | ICD-10-CM

## 2021-07-12 MED ORDER — GADOBENATE DIMEGLUMINE 529 MG/ML IV SOLN
11.0000 mL | Freq: Once | INTRAVENOUS | Status: AC | PRN
  Administered 2021-07-12: 11 mL via INTRAVENOUS

## 2021-07-13 ENCOUNTER — Ambulatory Visit: Payer: Medicare HMO | Admitting: Neurology

## 2021-07-13 DIAGNOSIS — R251 Tremor, unspecified: Secondary | ICD-10-CM | POA: Diagnosis not present

## 2021-07-13 DIAGNOSIS — R42 Dizziness and giddiness: Secondary | ICD-10-CM

## 2021-07-13 DIAGNOSIS — H9190 Unspecified hearing loss, unspecified ear: Secondary | ICD-10-CM

## 2021-07-13 DIAGNOSIS — G3184 Mild cognitive impairment, so stated: Secondary | ICD-10-CM

## 2021-07-20 NOTE — Procedures (Signed)
   HISTORY: 76 years old female, presenting with sudden onset of vertigo  TECHNIQUE:  This is a routine 16 channel EEG recording with one channel devoted to a limited EKG recording.  It was performed during wakefulness, drowsiness and asleep.  Photic stimulation were performed as activating procedures.  There are minimum muscle and movement artifact noted.  Upon maximum arousal, posterior dominant waking rhythm consistent of rhythmic alpha range activity.  Activities are symmetric over the bilateral posterior derivations and attenuated with eye opening.  Hyperventilation was not performed due to history of COPD. Photic stimulation did not alter the tracing.  During EEG recording, patient developed drowsiness and no deeper stage of sleep was achieved  During EEG recording, there was no epileptiform discharge noted.  EKG demonstrate sinus rhythm, with heart rate of 60 bpm  CONCLUSION: This is a  normal awake EEG.  There is no electrodiagnostic evidence of epileptiform discharge.  Marcial Pacas, M.D. Ph.D.  [facility] [place], [place] [address] Phone: [phone] Fax:      [phone]

## 2021-07-29 NOTE — Progress Notes (Unsigned)
KATALIA CHOMA Jan 28, 1946 [id number]   History:  76 y.o. G2P2002 presents for breast and pelvic exam without GYN complaints. Postmenopausal - on ERT. She has tried to wean but is unable to tolerate hot flashes. She still has occasional hot flashes on her current dose. S/P 1979 TVH. Normal pap and mammogram history. Osteopenia managed by orthopedics, on prolia. Hypothyroidism and HLD also managed by PCP. Smokes 5 cigarettes per day.   Gynecologic History No LMP recorded. Patient has had a hysterectomy.   Contraception: status post hysterectomy Sexually active: Yes  Health Maintenance Last Pap: 05/22/2012. Results were: Normal Last mammogram: 10/03/2020. Results were: Left asymmetry, follow up U/S normal Last colonoscopy: 10/31/2016. Results were: Normal, 10-year recall Last Dexa: 05/05/2021. Results were: T-score -1.7  Past medical history, past surgical history, family history and social history were all reviewed and documented in the EPIC chart. Married. Retired. 2 children.   ROS:  A ROS was performed and pertinent positives and negatives are included.  Exam:  Vitals:   07/30/21 1022  BP: 122/74  Weight: 119 lb (54 kg)  Height: '5\' 2"'$  (1.575 m)    Body mass index is 21.77 kg/m.  General appearance:  Normal Thyroid:  Symmetrical, normal in size, without palpable masses or nodularity. Respiratory  Auscultation:  Clear without wheezing or rhonchi Cardiovascular  Auscultation:  Regular rate, without rubs, murmurs or gallops  Edema/varicosities:  Not grossly evident Abdominal  Soft,nontender, without masses, guarding or rebound.  Liver/spleen:  No organomegaly noted  Hernia:  None appreciated  Skin  Inspection:  Grossly normal. Numerous moles Breasts: Examined lying and sitting.   Right: Without masses, retractions, nipple discharge or axillary adenopathy.   Left: Without masses, retractions, nipple discharge or axillary adenopathy. Genitourinary   Inguinal/mons:  Normal  without inguinal adenopathy  External genitalia:  Normal appearing vulva with no masses, tenderness, or lesions  BUS/Urethra/Skene's glands:  Normal  Vagina:  Normal appearing with normal color and discharge, no lesions. Atrophic changes  Cervix:  Absent  Uterus:  Absent  Adnexa/parametria:     Rt: Normal in size, without masses or tenderness.   Lt: Normal in size, without masses or tenderness.  Anus and perineum: Normal  Digital rectal exam: Normal sphincter tone without palpated masses or tenderness  Patient informed chaperone available to be present for breast and pelvic exam. Patient has requested no chaperone to be present. Patient has been advised what will be completed during breast and pelvic exam.   Assessment/Plan:  76 y.o. G2P2002 for breast and pelvic exam.   Well female exam with routine gynecological exam - Education provided on SBEs, importance of preventative screenings, current guidelines, high calcium diet, regular exercise, and multivitamin daily. Labs with PCP.   Hormone replacement therapy - Plan: estradiol (ESTRACE) 1 MG tablet daily. She has tried to wean but is unable to tolerate hot flashes. She still has occasional hot flashes on her current dose. We again discussed the risks of continued use at her age and she would like to continue. Recommend trying to wean every 6-12 months. Refill x 1 year provided.   Osteopenia, unspecified location - Last Dexa 04/2021 T-score -1.7, on Prolia. Managed by ortho. Continue daily Vitamin D supplement and increase exercise.   Screening for cervical cancer - Normal Pap history. No longer screen per guidleines.   Screening for breast cancer - Normal mammogram history.  Continue annual screenings.  Normal breast exam today.  Screening for colon cancer - 2018 colonoscopy. Will repeat at  10-year interval per GI's recommendation.   Return in 1 year for medication follow up.     Tamela Gammon DNP, 10:57 AM 07/30/2021

## 2021-07-30 ENCOUNTER — Encounter: Payer: Self-pay | Admitting: Nurse Practitioner

## 2021-07-30 ENCOUNTER — Other Ambulatory Visit: Payer: Self-pay | Admitting: Neurology

## 2021-07-30 ENCOUNTER — Ambulatory Visit (INDEPENDENT_AMBULATORY_CARE_PROVIDER_SITE_OTHER): Payer: Medicare HMO | Admitting: Nurse Practitioner

## 2021-07-30 VITALS — BP 122/74 | Ht 62.0 in | Wt 119.0 lb

## 2021-07-30 DIAGNOSIS — Z79890 Hormone replacement therapy (postmenopausal): Secondary | ICD-10-CM | POA: Diagnosis not present

## 2021-07-30 DIAGNOSIS — Z9189 Other specified personal risk factors, not elsewhere classified: Secondary | ICD-10-CM

## 2021-07-30 DIAGNOSIS — B009 Herpesviral infection, unspecified: Secondary | ICD-10-CM

## 2021-07-30 DIAGNOSIS — M8589 Other specified disorders of bone density and structure, multiple sites: Secondary | ICD-10-CM | POA: Diagnosis not present

## 2021-07-30 DIAGNOSIS — Z01419 Encounter for gynecological examination (general) (routine) without abnormal findings: Secondary | ICD-10-CM

## 2021-07-30 MED ORDER — ESTRADIOL 1 MG PO TABS: 1.0000 mg | ORAL_TABLET | Freq: Every day | ORAL | 3 refills | Status: DC

## 2021-07-31 ENCOUNTER — Telehealth: Payer: Self-pay

## 2021-07-31 NOTE — Telephone Encounter (Signed)
Referral Sent

## 2021-07-31 NOTE — Telephone Encounter (Signed)
Patient got a clean bill of health from Neurology-normal Mri and EEG. She wants to know where to go from here. She is asking if she needs to see ENT. She had 2 small episodes of vertigo this week but she has decreased hearing as well. Pleas advise.

## 2021-08-03 ENCOUNTER — Encounter: Payer: Self-pay | Admitting: Internal Medicine

## 2021-08-03 NOTE — Telephone Encounter (Signed)
Done

## 2021-08-18 ENCOUNTER — Other Ambulatory Visit: Payer: Self-pay | Admitting: Nurse Practitioner

## 2021-08-18 DIAGNOSIS — Z79890 Hormone replacement therapy (postmenopausal): Secondary | ICD-10-CM

## 2021-09-15 ENCOUNTER — Encounter: Payer: Self-pay | Admitting: Internal Medicine

## 2021-09-15 ENCOUNTER — Ambulatory Visit (INDEPENDENT_AMBULATORY_CARE_PROVIDER_SITE_OTHER): Payer: Medicare HMO | Admitting: Internal Medicine

## 2021-09-15 VITALS — BP 120/80 | HR 73 | Temp 98.1°F | Wt 112.5 lb

## 2021-09-15 DIAGNOSIS — M2550 Pain in unspecified joint: Secondary | ICD-10-CM | POA: Diagnosis not present

## 2021-09-15 MED ORDER — IBUPROFEN 800 MG PO TABS: 800.0000 mg | ORAL_TABLET | Freq: Three times a day (TID) | ORAL | 2 refills | Status: DC | PRN

## 2021-09-15 MED ORDER — IBUPROFEN 600 MG PO TABS: 600.0000 mg | ORAL_TABLET | Freq: Two times a day (BID) | ORAL | 0 refills | Status: DC

## 2021-09-15 NOTE — Progress Notes (Signed)
   Subjective:    Patient ID: Megan Williams, female    DOB: 15-Jan-1946, 76 y.o.   MRN: [id number]  HPI 76 year old Female seen with complaint of diffuse musculoskeletal pain.  Does not recall any recent illnesses.  No known injury.  No prior history of fibromyalgia syndrome.  No hot or red joints.  History of pericarditis in September 2022, paroxysmal atrial fibs and hypothyroidism.  History of mild memory loss.  History of vertigo seen by neurologist and has improved some.  Does take Prolia per Dr. Layne Benton.  Takes Effexor.  Is not on statin medication.  Is on thyroid replacement medication.  Takes trazodone 100 mg at bedtime and that should help some with musculoskeletal pain.  Also has Flexeril on hand.  Has tried Ibuprofen and Tylenol with minimal relief.  Review of Systems see above-no fever, chills, recent viral infection or respiratory infection.     Objective:   Physical Exam   There are no hot or swollen joints on joint exam.  Her chest is clear.  Cardiac exam regular rate and rhythm.  She does have some point tenderness in her right sternocleidomastoid muscle area.  Some tenderness to palpation along her right scapular area.      Assessment & Plan:  Musculoskeletal pain-?  Possible fibromyalgia syndrome.  Do not think this represents an injury.  No hot or red's joints like rheumatoid arthritis.  We did draw up today CBC with differential, total CK, ANA, CCP, sed rate, rheumatoid factor.  Offered physical therapy.  Recommend Flexeril if needed and ibuprofen 600 mg twice daily with a meal if needed for musculoskeletal pain.

## 2021-09-16 NOTE — Patient Instructions (Signed)
We will try ibuprofen 600 mg twice daily for musculoskeletal pain.  Physical therapy offered.  Labs drawn and pending for rheumatology disorders.  May have fibromyalgia syndrome.

## 2021-09-18 DIAGNOSIS — N319 Neuromuscular dysfunction of bladder, unspecified: Secondary | ICD-10-CM | POA: Diagnosis not present

## 2021-09-18 DIAGNOSIS — N3281 Overactive bladder: Secondary | ICD-10-CM | POA: Diagnosis not present

## 2021-09-18 LAB — CBC WITH DIFFERENTIAL/PLATELET
Absolute Monocytes: 502 cells/uL (ref 200–950)
Basophils Absolute: 53 cells/uL (ref 0–200)
Basophils Relative: 0.8 %
Eosinophils Absolute: 53 cells/uL (ref 15–500)
Eosinophils Relative: 0.8 %
HCT: 44.4 % (ref 35.0–45.0)
Hemoglobin: 14.7 g/dL (ref 11.7–15.5)
Lymphs Abs: 2673 cells/uL (ref 850–3900)
MCH: 32.3 pg (ref 27.0–33.0)
MCHC: 33.1 g/dL (ref 32.0–36.0)
MCV: 97.6 fL (ref 80.0–100.0)
MPV: 9.6 fL (ref 7.5–12.5)
Monocytes Relative: 7.6 %
Neutro Abs: 3320 cells/uL (ref 1500–7800)
Neutrophils Relative %: 50.3 %
Platelets: 319 10*3/uL (ref 140–400)
RBC: 4.55 10*6/uL (ref 3.80–5.10)
RDW: 13.5 % (ref 11.0–15.0)
Total Lymphocyte: 40.5 %
WBC: 6.6 10*3/uL (ref 3.8–10.8)

## 2021-09-18 LAB — CK: Total CK: 41 U/L (ref 29–143)

## 2021-09-18 LAB — RHEUMATOID FACTOR: Rheumatoid fact SerPl-aCnc: <14 IU/mL (ref <14)

## 2021-09-18 LAB — ANA: Anti Nuclear Antibody (ANA): NEGATIVE

## 2021-09-18 LAB — SEDIMENTATION RATE: Sed Rate: 6 mm/h (ref 0–30)

## 2021-09-18 LAB — CYCLIC CITRUL PEPTIDE ANTIBODY, IGG: Cyclic Citrullin Peptide Ab: <16 UNITS

## 2021-09-28 ENCOUNTER — Other Ambulatory Visit: Payer: Self-pay | Admitting: Internal Medicine

## 2021-10-10 ENCOUNTER — Other Ambulatory Visit: Payer: Self-pay | Admitting: Internal Medicine

## 2021-10-13 DIAGNOSIS — H9193 Unspecified hearing loss, bilateral: Secondary | ICD-10-CM | POA: Diagnosis not present

## 2021-10-13 DIAGNOSIS — R42 Dizziness and giddiness: Secondary | ICD-10-CM | POA: Diagnosis not present

## 2021-10-15 ENCOUNTER — Telehealth: Payer: Self-pay | Admitting: Internal Medicine

## 2021-10-15 NOTE — Telephone Encounter (Signed)
The issue that she came in for in July 18th has not gotten any better and its gotten worse, she wasn't sure what to do

## 2021-10-19 ENCOUNTER — Other Ambulatory Visit: Payer: Medicare HMO

## 2021-10-19 DIAGNOSIS — I10 Essential (primary) hypertension: Secondary | ICD-10-CM | POA: Diagnosis not present

## 2021-10-19 DIAGNOSIS — F419 Anxiety disorder, unspecified: Secondary | ICD-10-CM | POA: Diagnosis not present

## 2021-10-19 DIAGNOSIS — E7849 Other hyperlipidemia: Secondary | ICD-10-CM | POA: Diagnosis not present

## 2021-10-19 DIAGNOSIS — E039 Hypothyroidism, unspecified: Secondary | ICD-10-CM

## 2021-10-19 DIAGNOSIS — F32A Depression, unspecified: Secondary | ICD-10-CM | POA: Diagnosis not present

## 2021-10-20 ENCOUNTER — Other Ambulatory Visit: Payer: Self-pay | Admitting: Internal Medicine

## 2021-10-20 DIAGNOSIS — Z1231 Encounter for screening mammogram for malignant neoplasm of breast: Secondary | ICD-10-CM

## 2021-10-20 LAB — CBC WITH DIFFERENTIAL/PLATELET
Absolute Monocytes: 433 cells/uL (ref 200–950)
Basophils Absolute: 49 cells/uL (ref 0–200)
Basophils Relative: 0.8 %
Eosinophils Absolute: 31 cells/uL (ref 15–500)
Eosinophils Relative: 0.5 %
HCT: 43.4 % (ref 35.0–45.0)
Hemoglobin: 14.7 g/dL (ref 11.7–15.5)
Lymphs Abs: 2074 cells/uL (ref 850–3900)
MCH: 32.8 pg (ref 27.0–33.0)
MCHC: 33.9 g/dL (ref 32.0–36.0)
MCV: 96.9 fL (ref 80.0–100.0)
MPV: 9.6 fL (ref 7.5–12.5)
Monocytes Relative: 7.1 %
Neutro Abs: 3514 cells/uL (ref 1500–7800)
Neutrophils Relative %: 57.6 %
Platelets: 287 10*3/uL (ref 140–400)
RBC: 4.48 10*6/uL (ref 3.80–5.10)
RDW: 13.4 % (ref 11.0–15.0)
Total Lymphocyte: 34 %
WBC: 6.1 10*3/uL (ref 3.8–10.8)

## 2021-10-20 LAB — COMPLETE METABOLIC PANEL WITH GFR
AG Ratio: 1.6 (calc) (ref 1.0–2.5)
ALT: 8 U/L (ref 6–29)
AST: 18 U/L (ref 10–35)
Albumin: 4.2 g/dL (ref 3.6–5.1)
Alkaline phosphatase (APISO): 36 U/L — ABNORMAL LOW (ref 37–153)
BUN/Creatinine Ratio: 11 (calc) (ref 6–22)
BUN: 15 mg/dL (ref 7–25)
CO2: 27 mmol/L (ref 20–32)
Calcium: 10.2 mg/dL (ref 8.6–10.4)
Chloride: 107 mmol/L (ref 98–110)
Creat: 1.42 mg/dL — ABNORMAL HIGH (ref 0.60–1.00)
Globulin: 2.7 g/dL (calc) (ref 1.9–3.7)
Glucose, Bld: 99 mg/dL (ref 65–99)
Potassium: 5 mmol/L (ref 3.5–5.3)
Sodium: 144 mmol/L (ref 135–146)
Total Bilirubin: 0.4 mg/dL (ref 0.2–1.2)
Total Protein: 6.9 g/dL (ref 6.1–8.1)
eGFR: 39 mL/min/{1.73_m2} — ABNORMAL LOW (ref >=60)

## 2021-10-20 LAB — TSH: TSH: 4.83 mIU/L — ABNORMAL HIGH (ref 0.40–4.50)

## 2021-10-20 LAB — LIPID PANEL
Cholesterol: 175 mg/dL (ref <200)
HDL: 67 mg/dL (ref >=50)
LDL Cholesterol (Calc): 87 mg/dL (calc)
Non-HDL Cholesterol (Calc): 108 mg/dL (calc) (ref <130)
Total CHOL/HDL Ratio: 2.6 (calc) (ref <5.0)
Triglycerides: 116 mg/dL (ref <150)

## 2021-10-21 ENCOUNTER — Ambulatory Visit
Admission: RE | Admit: 2021-10-21 | Discharge: 2021-10-21 | Disposition: A | Discharge status: Home / Self Care | Payer: Medicare HMO | Source: Ambulatory Visit | Attending: Internal Medicine | Admitting: Internal Medicine

## 2021-10-21 DIAGNOSIS — Z1231 Encounter for screening mammogram for malignant neoplasm of breast: Secondary | ICD-10-CM | POA: Diagnosis not present

## 2021-10-26 ENCOUNTER — Ambulatory Visit (INDEPENDENT_AMBULATORY_CARE_PROVIDER_SITE_OTHER): Payer: Medicare HMO | Admitting: Internal Medicine

## 2021-10-26 ENCOUNTER — Encounter: Payer: Self-pay | Admitting: Internal Medicine

## 2021-10-26 VITALS — BP 98/70 | HR 64 | Temp 97.7°F | Ht 62.25 in | Wt 121.1 lb

## 2021-10-26 DIAGNOSIS — Z8679 Personal history of other diseases of the circulatory system: Secondary | ICD-10-CM | POA: Diagnosis not present

## 2021-10-26 DIAGNOSIS — F419 Anxiety disorder, unspecified: Secondary | ICD-10-CM

## 2021-10-26 DIAGNOSIS — R7989 Other specified abnormal findings of blood chemistry: Secondary | ICD-10-CM

## 2021-10-26 DIAGNOSIS — Z23 Encounter for immunization: Secondary | ICD-10-CM | POA: Diagnosis not present

## 2021-10-26 DIAGNOSIS — G250 Essential tremor: Secondary | ICD-10-CM | POA: Diagnosis not present

## 2021-10-26 DIAGNOSIS — R413 Other amnesia: Secondary | ICD-10-CM

## 2021-10-26 DIAGNOSIS — K219 Gastro-esophageal reflux disease without esophagitis: Secondary | ICD-10-CM

## 2021-10-26 DIAGNOSIS — F32A Depression, unspecified: Secondary | ICD-10-CM

## 2021-10-26 DIAGNOSIS — E039 Hypothyroidism, unspecified: Secondary | ICD-10-CM

## 2021-10-26 DIAGNOSIS — M48062 Spinal stenosis, lumbar region with neurogenic claudication: Secondary | ICD-10-CM | POA: Diagnosis not present

## 2021-10-26 DIAGNOSIS — I10 Essential (primary) hypertension: Secondary | ICD-10-CM

## 2021-10-26 DIAGNOSIS — Z0000 Encounter for general adult medical examination without abnormal findings: Secondary | ICD-10-CM

## 2021-10-26 DIAGNOSIS — R42 Dizziness and giddiness: Secondary | ICD-10-CM

## 2021-10-26 DIAGNOSIS — M810 Age-related osteoporosis without current pathological fracture: Secondary | ICD-10-CM

## 2021-10-26 DIAGNOSIS — Z8709 Personal history of other diseases of the respiratory system: Secondary | ICD-10-CM

## 2021-10-26 DIAGNOSIS — N3281 Overactive bladder: Secondary | ICD-10-CM

## 2021-10-26 LAB — POCT URINALYSIS DIPSTICK
Bilirubin, UA: NEGATIVE
Blood, UA: NEGATIVE
Glucose, UA: NEGATIVE
Ketones, UA: NEGATIVE
Leukocytes, UA: NEGATIVE
Nitrite, UA: NEGATIVE
Protein, UA: NEGATIVE
Spec Grav, UA: 1.015 (ref 1.010–1.025)
Urobilinogen, UA: 0.2 E.U./dL
pH, UA: 5 (ref 5.0–8.0)

## 2021-10-26 MED ORDER — LEVOTHYROXINE SODIUM 100 MCG PO TABS: 100.0000 ug | ORAL_TABLET | Freq: Every day | ORAL | 3 refills | Status: DC

## 2021-10-26 NOTE — Progress Notes (Addendum)
Annual Wellness Visit     Patient: Megan Williams, Female    DOB: December 14, 1945, 76 y.o.   MRN: [id number] Visit Date: 10/26/2021  Chief Complaint  Patient presents with   Annual Exam   Subjective    Megan Williams is a 76 y.o. Female who presents today for her Annual Wellness Visit.   HPI 76 year old Female seen for health maintenance exam and evaluation of medical issues.  Having musculoskeletal pain.  Tried [employer] for massage and it did not help. Is going to try PT at [facility]. Declines Covid booster.Due for 2nd Shingrix vaccine. Will get high dose flu vaccine at pharamacy. Discussed Pneumococcal 20 and RSV.  Had bone density study with -1.7 T score in March 2023 and is still on Prolia per Dr. Layne Benton.  Still being seen at [facility].  Was having issues with vertigo but that has improved slowly.  Colonoscopy 2018. Repeat study in 2028.  Had mammogram August 24 which was normal.  In August 2022 she was admitted to the hospital with chest pain.  MI was ruled out but white blood cell count was 14,300.  Chest CT Showed bilateral pleural effusions and centrilobular emphysema.  Treated compression fracture at T12 noted.  She was found to have moderate pericardial effusion with overlying enhancement of the pericardium concerning for pericarditis.  She was admitted to the hospital and was also found to have paroxysmal atrial fibrillation.  Echocardiogram showed ejection fraction to be by estimation 55 to 60% with normal LV function.  She was treated with IV amiodarone for atrial fibs and converted to sinus rhythm.  Heparin was started.  Anticoagulation was subsequently discontinued.  She was continued on colchicine and ibuprofen.  Sed rate was 117 and CRP was 23.5.  Sed rate subsequently improved from 128 on August 23 to 30 3 on September 29.  Creatinine improved from 1.49 in mid September to 0.96.  History of hypothyroidism treated with levothyroxine.  She has  chronic back pain and has had several back surgeries.  Is on Prolia for osteopenia.  Bone density study scheduled for March 2023.  History of GE reflux treated with Protonix.  History of mild memory loss treated with Namenda.  Social history: Married with 2 adult children.  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  Family history: Unknown as she is adopted.  Tonsillectomy 1963, hysterectomy without oophorectomy 1979, cholecystectomy 1993, thyroidectomy 1968.  Is seen Dr. Gerlene Burdock in [place] for overactive bladder.  Had normal colonoscopy 2018 by Dr. Teena Dunk.  Had joint reconstruction of right thumb by Dr. Daylene Katayama in 2008.  In 2018 she fell onto her knees and had increasing pain in the left buttock and in December 2019 had decompression L3-L4 by Dr. Ellene Route.  In 2012 she had L4-L5 decompression stabilization by Dr. Ellene Route.  She also had cervical spine 3 level fusion C4-C7 by Dr. Ellene Route in 2005.  History of T10 compression fracture in 2017 after a fall in the shower.       Review of Systems vertigo has improved with Valium and resolved. Has stopped taking the Valium and will be getting PT   Objective    Vitals:   Physical Exam VS  viewed. BP 98/70, pulse 64, pulse ox 98%, BMI 21.98 Skin: Warm and dry.  No cervical adenopathy.  No  thyromegaly.  No carotid bruits.  Chest clear.  Cardiac exam: Regular rate and rhythm without ectopy.  Abdomen soft nondistended without hepatosplenomegaly masses or tenderness.  No lower extremity pitting edema.  Brief neurological exam intact without gross focal deficits.  Most recent functional status assessment:    10/29/2020    3:00 PM  In your present state of health, do you have any difficulty performing the following activities:  Hearing? 0  Vision? 0  Difficulty concentrating or making decisions? 0  Walking or climbing  stairs? 0  Dressing or bathing? 0  Doing errands, shopping? 0   Most recent fall risk assessment:    11/27/2020   10:37 AM  Fall Risk   Falls in the past year? 1  Number falls in past yr: 0  Injury with Fall? 1  Risk for fall due to : History of fall(s)  Follow up Falls evaluation completed    Most recent depression screenings:    11/27/2020   10:38 AM 10/21/2020    2:16 PM  PHQ 2/9 Scores  PHQ - 2 Score 0 0          Assessment & Plan  History of pericarditis requiring hospitalization August 2022 and recovered  Mild memory loss followed at [facility]  Vertigo seen by neurology and slowly improving  History of essential tremor seen by neurology  History of overactive bladder followed by urologist in [place] reflux treated with PPI  Normal colonoscopy 2018  Joint reconstruction right thumb 2008 by Dr. Edyth Gunnels  Osteoporosis treated with Prolia by Dr. Layne Benton  Hypothyroidism stable with thyroid replacement  Hypertension stable on medication  Hyperlipidemia-stable  Anxiety and depression  History of COPD  Depression single Donata Clay  Plan: Vaccines discussed.  She will consider these.  Tdap up-to-date.  Pneumococcal 20 vaccine given in office today.  Consider RSV vaccine, flu vaccine, COVID booster.  There are a few labs that are abnormal including a creatinine of 1.42 and that will be repeated in the near future.  TSH is 4.83 and she may have missed some doses of thyroid replacement medicine that will be repeated in October as well.  Her lipid panel is normal.  CBC is normal.       Annual wellness visit done today including the all of the following: Reviewed patient's Family Medical History Reviewed and updated list of patient's medical providers Assessment of cognitive impairment was done Assessed patient's functional ability Established a written schedule for health screening [place] Completed and  Reviewed  Discussed health benefits of physical activity, and encouraged her to engage in regular exercise appropriate for her age and condition.        Donat Humble D Emanuel Dowson, CMA   I, Elby Showers, MD, have reviewed all documentation for this visit. The documentation on 11/21/21 for the exam, diagnosis, procedures, and orders are all accurate and complete.

## 2021-10-29 ENCOUNTER — Other Ambulatory Visit: Payer: Self-pay | Admitting: Internal Medicine

## 2021-10-29 DIAGNOSIS — H81313 Aural vertigo, bilateral: Secondary | ICD-10-CM | POA: Diagnosis not present

## 2021-11-03 DIAGNOSIS — H52203 Unspecified astigmatism, bilateral: Secondary | ICD-10-CM | POA: Diagnosis not present

## 2021-11-03 DIAGNOSIS — H02831 Dermatochalasis of right upper eyelid: Secondary | ICD-10-CM | POA: Diagnosis not present

## 2021-11-03 DIAGNOSIS — H43813 Vitreous degeneration, bilateral: Secondary | ICD-10-CM | POA: Diagnosis not present

## 2021-11-03 DIAGNOSIS — H524 Presbyopia: Secondary | ICD-10-CM | POA: Diagnosis not present

## 2021-11-03 DIAGNOSIS — H16223 Keratoconjunctivitis sicca, not specified as Sjogren's, bilateral: Secondary | ICD-10-CM | POA: Diagnosis not present

## 2021-11-03 DIAGNOSIS — H5203 Hypermetropia, bilateral: Secondary | ICD-10-CM | POA: Diagnosis not present

## 2021-11-04 DIAGNOSIS — R42 Dizziness and giddiness: Secondary | ICD-10-CM | POA: Diagnosis not present

## 2021-11-04 DIAGNOSIS — H903 Sensorineural hearing loss, bilateral: Secondary | ICD-10-CM | POA: Diagnosis not present

## 2021-11-04 NOTE — Progress Notes (Signed)
Patient: Megan Williams Date of Birth: [birth year]  Reason for Visit: Follow up History from: Patient Primary Neurologist: Dr.Yan  ASSESSMENT AND PLAN 76 y.o. year old female   23.  Recurrent episode of sudden onset vertigo, also reported couple years history of gradual onset bilateral hearing loss 2.  History of myopericarditis in September 2022, transient atrial fibrillation, was treated with IV amiodarone followed by p.o. then tapered off. 3.  Mild cognitive impairment 4.  Essential tremor  -Vertigo has resolved, saw ENT, continue to work with audiology for consideration of amplification device -Tremor is doing well, continue propanolol as needed -We will continue Namenda, no changes to memory -EEG was normal May 2023 -MRI of the brain with and without contrast/IAC was unremarkable May 2023 -Follow-up in 6 months or sooner if needed  HISTORY  Megan Williams 76 years old right-handed female, seen in refer by her primary care physician Dr. Tedra [place] for evaluation of tremor, memory loss, and gait problems     Past medical history Longtime smoker, Depression, anxiety, Hypertension History of cervical decompression surgery, Chronic low back pain   She used to work as a Radio broadcast assistant, and Energy manager   Around 2015, she began to notice memory trouble, she needs family to remind her multiple times, tends to forget people's name, phone number, she used to be able to remember all the congregation's name in the past, her memory trouble since 2 gradually getting worse, she still driving without getting lost   She reported history of migraine since young, for a while in September to October 2015, she has migraines almost on a daily basis, which has improved after stopped taking Trileptal   She also reported mild bilateral hands tremor since 2014, most noticeable when she holding a utensil, or write with a pencil, she also noticed mild bilateral hands weakness, in Thanksgiving  2015, she has dropped her dishes to the floor, because of bilateral hands weakness,   Around 2015, she also noticed mild stiff unsteady gait, worsening urinary urgency, she denies significant neck pain, complains of moderate low back pain, she denies bilateral upper or lower extremity paresthesia   She is adopted, does not know family history, none of her children has tremor   MRI brain film in November 2016, mild generalized atrophy, mild supratentorium small vessel disease,    MRI of the cervical spine showed evidence of previous fusion from C4-7, 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   Laboratory evaluation failed to demonstrate treatable etiology, A1c was 4.9, normal B12, TSH   Her memory loss has been stable, recent visit in separate March 2023 Mini-Mental status was 30/30,  She continues to be on treatment for depression anxiety, including Effexor 300 mg daily, trazodone 200 mg at bedtime  She was admitted to the hospital in September 2022 for acute onset of chest pain, chest pressure, was diagnosed with acute myopericarditis, echo showed pericardial effusion, IVC collapse, small pericardial effusion measuring 0.82 cm, posterior to left ventricle, with significantly elevated ESR 117, C-reactive protein 23.5, there was also short duration of atrial fibrillation with rapid ventricular rate, IV load amiodarone, then transition to p.o., now no longer taking it, she was treated with colchicine, high-dose of ibuprofen up to 600 mg 3 times daily,   And following up with cardiology December 2022, she was asymptomatic, colchicine was stopped, tapered off ibuprofen   She had a history of 1 episode of sudden onset of  vertigo more than 20 years ago, has been vertigo free for all those years  Since her myopericarditis in September 2022, she experienced multiple recurrent short lasting episode of vertigo, most recent 1 was on May 26, 2021, while driving, she had  sudden onset of spinning sensation, difficult to control her vehicle, she was able to manage pullover, symptoms last for few minutes, then resolved, she was able to finish her office visit for Prolia injection, 45 minutes later, she had another episode of longer sudden onset of vertigo, has to close her eyes, could not drive, no loss of consciousness,  She does complains of gradual onset bilateral hearing loss, TV volume has to be turned up from 12-18 now, she denies gait abnormality  Update November 05, 2021 SS: saw ENT, audiology testing, 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  Low suspicion for BPPV. No further episodes of vertigo. Went 1 time to vestibular rehab, stopped because the problem resolved. Tremor doing good, intermittently, takes propranolol 40 mg up to twice daily PRN, some days none at all. Doing overall well. On aspirin 81 mg.   REVIEW OF SYSTEMS: Out of a complete 14 system review of symptoms, the patient complains only of the following symptoms, and all other reviewed systems are negative.  See HPI  ALLERGIES: Allergies  Allergen Reactions   Doxycycline Other (See Comments) and Hives    Skin peeling  Other reaction(s): Stevens-Johnson Syndrome   Latex Rash, Hives and Itching   Morphine Itching and Other (See Comments)    Other reaction(s): Confusion, Dizziness   Penicillins Hives, Diarrhea and Nausea Only    Has patient had a PCN reaction causing immediate rash, facial/tongue/throat swelling, SOB or lightheadedness with hypotension: No Has patient had a PCN reaction causing severe rash involving mucus membranes or skin necrosis: No Has patient had a PCN reaction that required hospitalization: No Has patient had a PCN reaction occurring within the last 10 years: No If all of the above answers are "NO", then may proceed with Cephalosporin use. Other reaction(s): Abdominal Pain   Grass Extracts [Gramineae Pollens] Cough and  Itching    Other reaction(s): Eye Redness   Short Ragweed Pollen Ext Cough and Itching    Other reaction(s): Eye Redness, Other   Celebrex [Celecoxib] Other (See Comments)    Blistering, pain, swelling   Cephalosporins Nausea Only    Diarrhea, hives   Gabapentin     Causes TICS    Morphine And Related Itching    HOME MEDICATIONS: Outpatient Medications Prior to Visit  Medication Sig Dispense Refill   Cholecalciferol (VITAMIN D) 50 MCG (2000 UT) tablet Take 2,000 Units by mouth daily.     Cyanocobalamin (B-12) 1000 MCG SUBL Place 1,000 mcg under the tongue daily.     cyclobenzaprine (FLEXERIL) 10 MG tablet TAKE 1 TABLET (10 MG TOTAL) BY MOUTH AT BEDTIME. (Patient taking differently: Take 10 mg by mouth as needed for muscle spasms. As) 30 tablet 0   denosumab (PROLIA) 60 MG/ML SOLN injection Inject 60 mg into the skin every 6 (six) months. Administer in upper arm, thigh, or abdomen     diazepam (VALIUM) 5 MG tablet Take 2.5-5 mg by mouth daily as needed for anxiety.     estradiol (ESTRACE) 1 MG tablet Take 1 tablet (1 mg total) by mouth daily. 90 tablet 3   fenofibrate 160 MG tablet TAKE 1 TABLET BY MOUTH DAILY. 90 tablet 1   fexofenadine (ALLEGRA) 60 MG tablet Take  60 mg by mouth daily.     fluticasone (FLONASE) 50 MCG/ACT nasal spray Place 1 spray into both nostrils daily.     GEMTESA 75 MG TABS Take 1 tablet by mouth daily.     ibuprofen (ADVIL) 600 MG tablet Take 1 tablet (600 mg total) by mouth 2 (two) times daily. 60 tablet 0   levothyroxine (SYNTHROID) 100 MCG tablet Take 1 tablet (100 mcg total) by mouth daily. 90 tablet 3   Magnesium 250 MG TABS Take 250 mg by mouth daily.     Melatonin 10 MG TABS Take 10 mg by mouth at bedtime.     memantine (NAMENDA) 10 MG tablet Take 1 tablet (10 mg total) by mouth 2 (two) times daily. TAKE 10 mg TABLET BY MOUTH 2 TIMES DAILY. 180 tablet 4   pantoprazole (PROTONIX) 40 MG tablet TAKE 1 TABLET BY MOUTH DAILY. 90 tablet 3   propranolol  (INDERAL) 40 MG tablet Take 1 tablet (40 mg total) by mouth 2 (two) times daily. (Patient taking differently: Take 40 mg by mouth 2 (two) times daily as needed.) 180 tablet 3   RESTASIS 0.05 % ophthalmic emulsion Place 1 drop into both eyes 2 (two) times daily.     rOPINIRole (REQUIP) 0.5 MG tablet Take 0.5 mg by mouth at bedtime as needed (as directed).     traZODone (DESYREL) 100 MG tablet Take 200 mg by mouth at bedtime.     triamcinolone cream (KENALOG) 0.1 % Apply 1 application topically 2 (two) times daily. 30 g 0   valACYclovir (VALTREX) 500 MG tablet TAKE 1 TABLET BY MOUTH TWICE A DAY FOR 5 DAYS 10 tablet 5   venlafaxine XR (EFFEXOR-XR) 150 MG 24 hr capsule Take 300 mg by mouth daily with breakfast.     No facility-administered medications prior to visit.    PAST MEDICAL HISTORY: Past Medical History:  Diagnosis Date   Allergy    Anxiety    Arthritis    Atrial fibrillation ([place])    Complication of anesthesia    "difficulty waking up. I could hear them but I couldn't wake up"   COPD (chronic obstructive pulmonary disease) (HCC)    Depression    Emphysema    Esophagitis    Herpes simplex    Hypertension    Hypothyroidism    Migraines    Osteopenia    Vitamin D deficiency     PAST SURGICAL HISTORY: Past Surgical History:  Procedure Laterality Date   BACK SURGERY  02/14/2018   lumbar 3-4  fusion   BREAST EXCISIONAL BIOPSY Left 1979   Benign    CATARACT EXTRACTION, BILATERAL     CERVICAL FUSION     CHOLECYSTECTOMY     KYPHOPLASTY  09/19/2015   Dr. Ellene Route  T12   LUMBAR [place]  11/11/2017   L3-4   Thumb surg     THYROIDECTOMY     TONSILLECTOMY     VAGINAL HYSTERECTOMY     FAMILY HISTORY: Family History  Adopted: Yes  Family history unknown: Yes   SOCIAL HISTORY: Social History   Socioeconomic History   Marital status: Married    Spouse name: Not on file   Number of children: 2   Years of education: 14   Highest education level: Not on file   Occupational History   Occupation: Retired  Tobacco Use   Smoking status: Every Day   Smokeless tobacco: Never   Tobacco comments:    5  cigarettes daily  Vaping Use   Vaping Use: Never used  Substance and Sexual Activity   Alcohol use: Yes    Alcohol/week: 1.0 standard drink of alcohol    Types: 1 Glasses of wine per week    Comment: Rare   Drug use: No   Sexual activity: Not Currently    Birth control/protection: Surgical    Comment: INTERCOURSE AGE 59, SEXUAL PARTNERS LESS THAN 5  Other Topics Concern   Not on file  Social History Narrative   Lives at home with husband.   Right-handed.   1 cup caffeine daily.   Social Determinants of Health   Financial Resource Strain: Not on file  Food Insecurity: Not on file  Transportation Needs: Not on file  Physical Activity: Not on file  Stress: Not on file  Social Connections: Not on file  Intimate Partner Violence: Not on file    PHYSICAL EXAM  Vitals:   11/05/21 1016  BP: 126/67  Pulse: 74  Weight: 120 lb 8 oz (54.7 kg)  Height: 5' 2.2" (1.58 m)   Body mass index is 21.9 kg/m.  Generalized: Well developed, in no acute distress  Neurological examination  Mentation: Alert oriented to time, place, history taking. Follows all commands speech and language fluent Cranial nerve II-XII: Pupils were equal round reactive to light. Extraocular movements were full, visual field were full on confrontational test. Facial sensation and strength were normal.  Head turning and shoulder shrug were normal and symmetric. Motor: The motor testing reveals 5 over 5 strength of all 4 extremities. Good symmetric motor tone is noted throughout.  Sensory: Sensory testing is intact to soft touch on all 4 extremities. No evidence of extinction is noted.  Coordination: Cerebellar testing reveals good finger-nose-finger and heel-to-shin bilaterally.  No tremor noted. Gait and station: Gait is normal, mildly antalgic on the left. Tandem gait is  unsteady.  Romberg is negative. No drift is seen.  Reflexes: Deep tendon reflexes are symmetric and normal bilaterally.   DIAGNOSTIC DATA (LABS, IMAGING, TESTING) - I reviewed patient records, labs, notes, testing and imaging myself where available.  Lab Results  Component Value Date   WBC 6.1 10/19/2021   HGB 14.7 10/19/2021   HCT 43.4 10/19/2021   MCV 96.9 10/19/2021   PLT 287 10/19/2021      Component Value Date/Time   NA 144 10/19/2021 0945   K 5.0 10/19/2021 0945   CL 107 10/19/2021 0945   CO2 27 10/19/2021 0945   GLUCOSE 99 10/19/2021 0945   BUN 15 10/19/2021 0945   CREATININE 1.42 (H) 10/19/2021 0945   CALCIUM 10.2 10/19/2021 0945   PROT 6.9 10/19/2021 0945   ALBUMIN 4.0 10/07/2016 0925   AST 18 10/19/2021 0945   ALT 8 10/19/2021 0945   ALKPHOS 49 10/07/2016 0925   BILITOT 0.4 10/19/2021 0945   GFRNONAA >60 10/30/2020 0758   GFRNONAA 52 (L) 05/20/2020 1619   GFRAA 60 05/20/2020 1619   Lab Results  Component Value Date   CHOL 175 10/19/2021   HDL 67 10/19/2021   LDLCALC 87 10/19/2021   TRIG 116 10/19/2021   CHOLHDL 2.6 10/19/2021   Lab Results  Component Value Date   HGBA1C 4.9 10/07/2016   Lab Results  Component Value Date   VITAMINB12 >2,000 (H) 10/15/2019   Lab Results  Component Value Date   TSH 4.83 (H) 10/19/2021    Butler Denmark, AGNP-C, DNP 11/05/2021, 10:41 AM [facility] [address] [place], [place] [address] [phone])  [phone]

## 2021-11-05 ENCOUNTER — Encounter: Payer: Self-pay | Admitting: Neurology

## 2021-11-05 ENCOUNTER — Ambulatory Visit: Payer: Medicare HMO | Admitting: Neurology

## 2021-11-05 VITALS — BP 126/67 | HR 74 | Ht 62.2 in | Wt 120.5 lb

## 2021-11-05 DIAGNOSIS — R42 Dizziness and giddiness: Secondary | ICD-10-CM

## 2021-11-05 DIAGNOSIS — R251 Tremor, unspecified: Secondary | ICD-10-CM

## 2021-11-05 DIAGNOSIS — G3184 Mild cognitive impairment, so stated: Secondary | ICD-10-CM

## 2021-11-21 NOTE — Patient Instructions (Addendum)
It was a pleasure to see you today.  Labs that are mildly abnormal will be repeated here in 3 months.  Vaccines discussed.  Continue Prolia treatment for osteoporosis.  Follow-up here in 3 months.  No change in medications.

## 2021-11-23 DIAGNOSIS — F429 Obsessive-compulsive disorder, unspecified: Secondary | ICD-10-CM | POA: Diagnosis not present

## 2021-11-23 DIAGNOSIS — F3342 Major depressive disorder, recurrent, in full remission: Secondary | ICD-10-CM | POA: Diagnosis not present

## 2021-11-23 DIAGNOSIS — F4323 Adjustment disorder with mixed anxiety and depressed mood: Secondary | ICD-10-CM | POA: Diagnosis not present

## 2021-12-08 ENCOUNTER — Other Ambulatory Visit: Payer: Medicare HMO

## 2021-12-09 DIAGNOSIS — E559 Vitamin D deficiency, unspecified: Secondary | ICD-10-CM | POA: Diagnosis not present

## 2021-12-09 DIAGNOSIS — M810 Age-related osteoporosis without current pathological fracture: Secondary | ICD-10-CM | POA: Diagnosis not present

## 2021-12-10 ENCOUNTER — Other Ambulatory Visit: Payer: Medicare HMO

## 2021-12-10 ENCOUNTER — Telehealth: Payer: Self-pay | Admitting: Internal Medicine

## 2021-12-10 DIAGNOSIS — R7989 Other specified abnormal findings of blood chemistry: Secondary | ICD-10-CM

## 2021-12-10 DIAGNOSIS — E039 Hypothyroidism, unspecified: Secondary | ICD-10-CM | POA: Diagnosis not present

## 2021-12-10 MED ORDER — IBUPROFEN 600 MG PO TABS: 600.0000 mg | ORAL_TABLET | Freq: Two times a day (BID) | ORAL | 0 refills | Status: DC

## 2021-12-10 NOTE — Telephone Encounter (Signed)
Patient having back pain. This is chronic May try Ibuprofen intermittently q 12 hours. MJB, MD

## 2021-12-11 LAB — TSH: TSH: 0.49 mIU/L (ref 0.40–4.50)

## 2021-12-11 LAB — CREATININE, SERUM: Creat: 1.18 mg/dL — ABNORMAL HIGH (ref 0.60–1.00)

## 2021-12-11 LAB — BUN: BUN: 19 mg/dL (ref 7–25)

## 2022-01-08 DIAGNOSIS — M5416 Radiculopathy, lumbar region: Secondary | ICD-10-CM | POA: Diagnosis not present

## 2022-01-08 DIAGNOSIS — G8929 Other chronic pain: Secondary | ICD-10-CM | POA: Diagnosis not present

## 2022-01-08 DIAGNOSIS — M47816 Spondylosis without myelopathy or radiculopathy, lumbar region: Secondary | ICD-10-CM | POA: Diagnosis not present

## 2022-01-08 DIAGNOSIS — M5412 Radiculopathy, cervical region: Secondary | ICD-10-CM | POA: Diagnosis not present

## 2022-01-08 DIAGNOSIS — M546 Pain in thoracic spine: Secondary | ICD-10-CM | POA: Diagnosis not present

## 2022-01-19 ENCOUNTER — Other Ambulatory Visit: Payer: Self-pay | Admitting: Internal Medicine

## 2022-02-02 DIAGNOSIS — R200 Anesthesia of skin: Secondary | ICD-10-CM | POA: Diagnosis not present

## 2022-02-02 DIAGNOSIS — R202 Paresthesia of skin: Secondary | ICD-10-CM | POA: Diagnosis not present

## 2022-02-02 DIAGNOSIS — M5412 Radiculopathy, cervical region: Secondary | ICD-10-CM | POA: Diagnosis not present

## 2022-02-03 DIAGNOSIS — M546 Pain in thoracic spine: Secondary | ICD-10-CM | POA: Diagnosis not present

## 2022-02-12 ENCOUNTER — Ambulatory Visit: Payer: Medicare HMO | Attending: Internal Medicine | Admitting: Internal Medicine

## 2022-02-12 ENCOUNTER — Encounter: Payer: Self-pay | Admitting: Internal Medicine

## 2022-02-12 VITALS — BP 132/80 | HR 67 | Ht 62.0 in | Wt 119.0 lb

## 2022-02-12 DIAGNOSIS — I10 Essential (primary) hypertension: Secondary | ICD-10-CM

## 2022-02-12 DIAGNOSIS — I700 Atherosclerosis of aorta: Secondary | ICD-10-CM | POA: Diagnosis not present

## 2022-02-12 DIAGNOSIS — E785 Hyperlipidemia, unspecified: Secondary | ICD-10-CM | POA: Diagnosis not present

## 2022-02-12 DIAGNOSIS — Z8679 Personal history of other diseases of the circulatory system: Secondary | ICD-10-CM | POA: Diagnosis not present

## 2022-02-12 DIAGNOSIS — Z87891 Personal history of nicotine dependence: Secondary | ICD-10-CM

## 2022-02-12 DIAGNOSIS — J449 Chronic obstructive pulmonary disease, unspecified: Secondary | ICD-10-CM | POA: Diagnosis not present

## 2022-02-12 NOTE — Patient Instructions (Signed)
Medication Instructions:  Your physician recommends that you continue on your current medications as directed. Please refer to the Current Medication list given to you today.  *If you need a refill on your cardiac medications before your next appointment, please call your pharmacy*   Lab Work: Fasting Lipids in May If you have labs (blood work) drawn today and your tests are completely normal, you will receive your results only by: [place] (if you have MyChart) OR A paper copy in the mail If you have any lab test that is abnormal or we need to change your treatment, we will call you to review the results.   Follow-Up: At [facility], you and your health needs are our priority.  As part of our continuing mission to provide you with exceptional heart care, we have created designated Provider Care Teams.  These Care Teams include your primary Cardiologist (physician) and Advanced Practice Providers (APPs -  Physician Assistants and Nurse Practitioners) who all work together to provide you with the care you need, when you need it.  Your next appointment:   1 year(s)  The format for your next appointment:   In Person  Provider:   Werner Lean, MD     Important Information About Sugar

## 2022-02-12 NOTE — Progress Notes (Signed)
Cardiology Office Note:    Date:  02/12/2022   ID:  Megan Williams, DOB 04/01/1945, MRN [id number]  PCP:  Elby Showers, MD   [facility] Providers Cardiologist:  Werner Lean, MD     Referring MD: Elby Showers, MD   CC:  P-AF after pericarditis f/u  History of Present Illness:    Megan Williams is a 76 y.o. female with a hx of HTN seen 10/29/20 with new pericardial effusion and prodrome consistent with myopericarditis; with reactive atrial fibrillation.  2022:  Discharge on amiodarone (had converted through course), Ibuprofen, and colchicine.  Seen 11/13/20. Had interval echo with resolution of effusion.   Patient notes that she is doing ok.   No plans for surgery but getting a back epidural.  Had admission with non cardiac CT: with Aortic atherosclerosis no CAC. Just lost her 38 year old dog. Had one dog left. She is contemplative of stopping smoking.  No chest pain or pressure .  No SOB/DOE and no PND/Orthopnea.  No weight gain or leg swelling.  No palpitations or syncope.    Past Medical History:  Diagnosis Date   Allergy    Anxiety    Arthritis    Atrial fibrillation ([place])    Complication of anesthesia    "difficulty waking up. I could hear them but I couldn't wake up"   COPD (chronic obstructive pulmonary disease) (HCC)    Depression    Emphysema    Esophagitis    Herpes simplex    Hypertension    Hypothyroidism    Migraines    Osteopenia    Vitamin D deficiency     Past Surgical History:  Procedure Laterality Date   BACK SURGERY  02/14/2018   lumbar 3-4  fusion   BREAST EXCISIONAL BIOPSY Left 1979   Benign    CATARACT EXTRACTION, BILATERAL     CERVICAL FUSION     CHOLECYSTECTOMY     KYPHOPLASTY  09/19/2015   Dr. Ellene Route  T12   LUMBAR [place] SURGERY  11/11/2017   L3-4   Thumb surg     THYROIDECTOMY     TONSILLECTOMY     VAGINAL HYSTERECTOMY      Current Medications: Current Meds  Medication Sig   Cholecalciferol (VITAMIN D) 50 MCG  (2000 UT) tablet Take 2,000 Units by mouth daily.   Cyanocobalamin (B-12) 1000 MCG SUBL Place 1,000 mcg under the tongue daily.   cyclobenzaprine (FLEXERIL) 10 MG tablet TAKE 1 TABLET (10 MG TOTAL) BY MOUTH AT BEDTIME. (Patient taking differently: Take 10 mg by mouth as needed for muscle spasms. As)   denosumab (PROLIA) 60 MG/ML SOLN injection Inject 60 mg into the skin every 6 (six) months. Administer in upper arm, thigh, or abdomen   diazepam (VALIUM) 5 MG tablet Take 2.5-5 mg by mouth daily as needed for anxiety.   estradiol (ESTRACE) 1 MG tablet Take 1 tablet (1 mg total) by mouth daily.   fenofibrate 160 MG tablet TAKE 1 TABLET BY MOUTH DAILY.   fexofenadine (ALLEGRA) 60 MG tablet Take 60 mg by mouth daily.   fluticasone (FLONASE) 50 MCG/ACT nasal spray Place 1 spray into both nostrils daily.   GEMTESA 75 MG TABS Take 1 tablet by mouth daily.   IBU 600 MG tablet TAKE 1 TABLET BY MOUTH 2 TIMES DAILY. (Patient taking differently: Take 600 mg by mouth as needed for mild pain or moderate pain.)   ibuprofen (ADVIL) 600 MG tablet Take 1 tablet (  600 mg total) by mouth 2 (two) times daily.   levothyroxine (SYNTHROID) 100 MCG tablet Take 1 tablet (100 mcg total) by mouth daily.   Melatonin 10 MG TABS Take 10 mg by mouth at bedtime.   memantine (NAMENDA) 10 MG tablet Take 1 tablet (10 mg total) by mouth 2 (two) times daily. TAKE 10 mg TABLET BY MOUTH 2 TIMES DAILY.   pantoprazole (PROTONIX) 40 MG tablet TAKE 1 TABLET BY MOUTH DAILY.   propranolol (INDERAL) 40 MG tablet Take 1 tablet (40 mg total) by mouth 2 (two) times daily. (Patient taking differently: Take 40 mg by mouth daily at 6 (six) AM.)   RESTASIS 0.05 % ophthalmic emulsion Place 1 drop into both eyes 2 (two) times daily.   rOPINIRole (REQUIP) 0.5 MG tablet Take 0.5 mg by mouth at bedtime as needed (as directed).   traZODone (DESYREL) 100 MG tablet Take 200 mg by mouth at bedtime.   valACYclovir (VALTREX) 500 MG tablet TAKE 1 TABLET BY MOUTH  TWICE A DAY FOR 5 DAYS   venlafaxine XR (EFFEXOR-XR) 150 MG 24 hr capsule Take 300 mg by mouth daily with breakfast.     Allergies:   Doxycycline, Latex, Morphine, Penicillins, Grass extracts [gramineae pollens], Short ragweed pollen ext, Celebrex [celecoxib], Cephalosporins, Gabapentin, and Morphine and related   Social History   Socioeconomic History   Marital status: Married    Spouse name: Not on file   Number of children: 2   Years of education: 14   Highest education level: Not on file  Occupational History   Occupation: Retired  Tobacco Use   Smoking status: Every Day   Smokeless tobacco: Never   Tobacco comments:    5  cigarettes daily  Vaping Use   Vaping Use: Never used  Substance and Sexual Activity   Alcohol use: Yes    Alcohol/week: 1.0 standard drink of alcohol    Types: 1 Glasses of wine per week    Comment: Rare   Drug use: No   Sexual activity: Not Currently    Birth control/protection: Surgical    Comment: INTERCOURSE AGE 52, SEXUAL PARTNERS LESS THAN 5  Other Topics Concern   Not on file  Social History Narrative   Lives at home with husband.   Right-handed.   1 cup caffeine daily.   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 Family history is unknown by patient.  ROS:   Please see the history of present illness.     All other systems reviewed and are negative.  EKGs/Labs/Other Studies Reviewed:    The following studies were reviewed today:  EKG:   02/12/22: SR 67  02/13/21:   SR 85 WNL 11/13/20: SR rate 69   Cardiac Studies & Procedures       ECHOCARDIOGRAM  ECHOCARDIOGRAM LIMITED 12/05/2020  Narrative ECHOCARDIOGRAM LIMITED  REPORT    Patient Name:   Megan Williams Date of Exam: 12/05/2020 Medical Rec #:  [id number]     Height:       62.0 in Accession #:    [id number]    Weight:       116.0 lb Date of Birth:  01/04/1946  BSA:          1.517 m Patient Age:    42 years      BP:           128/68 mmHg Patient Gender: F             HR:           44 bpm. Exam Location:  [address]  Procedure: Limited Echo, Cardiac Doppler and Limited Color Doppler  Indications:    I31.39 Pericardial effusion (noninflammatory); I48.0 Paroxysmal atrial fibrillation  History:        Patient has prior history of Echocardiogram examinations, most recent 10/29/2020. COPD; Risk Factors:Hypertension, Dyslipidemia and Current Smoker. Migraine. Acute pericarditis. Hypothyroidism.  Sonographer:    Diamond Nickel RCS Referring Phys: [facility] A Sakura Denis  IMPRESSIONS   1. Left ventricular ejection fraction, by estimation, is 60 to 65%. The left ventricle has normal function. The left ventricle has no regional wall motion abnormalities. 2. Right ventricular systolic function is normal. The right ventricular size is normal. 3. The mitral valve is normal in structure. Trivial mitral valve regurgitation. No evidence of mitral stenosis. 4. The aortic valve is normal in structure. Aortic valve regurgitation is trivial. Mild aortic valve stenosis. 5. The inferior vena cava is normal in size with greater than 50% respiratory variability, suggesting right atrial pressure of 3 mmHg.  Comparison(s): Prior images reviewed side by side. The pericardial effusion has resolved.  FINDINGS Left Ventricle: Persistent ventricular bigeminy interferes with diastolic function assessment, but tissue Doppler suggests normal ventricular relaxation. Left ventricular ejection fraction, by estimation, is 60 to 65%. The left ventricle has normal function. The left ventricle has no regional wall motion abnormalities. The left ventricular internal cavity size  was normal in size. There is no left ventricular hypertrophy.  Right Ventricle: The right ventricular size is normal. No increase in right ventricular wall thickness. Right ventricular systolic function is normal.  Left Atrium: Left atrial size was normal in size.  Right Atrium: Right atrial size was normal in size.  Pericardium: There is no evidence of pericardial effusion.  Mitral Valve: The mitral valve is normal in structure. Mild mitral annular calcification. Trivial mitral valve regurgitation. No evidence of mitral valve stenosis.  Tricuspid Valve: The tricuspid valve is normal in structure. Tricuspid valve regurgitation is not demonstrated. No evidence of tricuspid stenosis.  Aortic Valve: The aortic valve is normal in structure. Aortic valve regurgitation is trivial. Mild aortic stenosis is present.  Pulmonic Valve: The pulmonic valve was normal in structure. Pulmonic valve regurgitation is not visualized. No evidence of pulmonic stenosis.  Aorta: The aortic root is normal in size and structure.  Venous: The inferior vena cava is normal in size with greater than 50% respiratory variability, suggesting right atrial pressure of 3 mmHg.  IAS/Shunts: No atrial level shunt detected by color flow Doppler.  LEFT VENTRICLE PLAX 2D LVIDd:         4.40 cm LVIDs:         3.30 cm LV PW:         1.10 cm LV IVS:        1.00 cm LVOT diam:     2.15 cm LV SV:         34 LV SV Index:   22 LVOT Area:     3.63 cm   RIGHT VENTRICLE RV S prime:     9.39 cm/s  LEFT ATRIUM         Index LA  diam:    3.70 cm 2.44 cm/m AORTIC VALVE LVOT Vmax:   65.60 cm/s LVOT Vmean:  42.033 cm/s LVOT VTI:    0.093 m  AORTA Ao Root diam: 3.40 cm Ao Asc diam:  3.20 cm   SHUNTS Systemic VTI:  0.09 m Systemic Diam: 2.15 cm  Mihai Croitoru MD Electronically signed by Sanda Klein MD Signature Date/Time: 12/05/2020/2:35:58 PM    Final    MONITORS  LONG TERM MONITOR (3-14 DAYS)  11/28/2020  Narrative  Patient had a minimum heart rate of 43 bpm, maximum heart rate of 102 bpm, and average heart rate of 74 bpm.  Predominant underlying rhythm was sinus rhythm.  Isolated PACs were rare (<1.0%).  Isolated PVCs were occasional (4.3%).  No evidence of significant heart block.  No triggered and diary events.  No malignant arrhythmias.             Recent Labs: 10/19/2021: ALT 8; Hemoglobin 14.7; Platelets 287; Potassium 5.0; Sodium 144 12/10/2021: BUN 19; Creat 1.18; TSH 0.49  Recent Lipid Panel    Component Value Date/Time   CHOL 175 10/19/2021 0945   TRIG 116 10/19/2021 0945   HDL 67 10/19/2021 0945   CHOLHDL 2.6 10/19/2021 0945   VLDL 29 10/30/2020 0758   LDLCALC 87 10/19/2021 0945    Physical Exam:    VS:  BP 132/80   Pulse 67   Ht '5\' 2"'$  (1.575 m)   Wt 119 lb (54 kg)   SpO2 97%   BMI 21.77 kg/m     Wt Readings from Last 3 Encounters:  02/12/22 119 lb (54 kg)  11/05/21 120 lb 8 oz (54.7 kg)  10/26/21 121 lb 1.9 oz (54.9 kg)    Gen: No distress  Neck: No JVD Cardiac: No Rubs or Gallops, no murmur or rub, regular +2 radial pulses Respiratory: Clear to auscultation bilaterally, poor normal effort, normal  respiratory rate GI: Soft, nontender, non-distended  MS: No  edema;  moves all extremities Integument: Skin feels warm Neuro:  At time of evaluation, alert and oriented to person/place/time/situation  Psych: Normal affect, patient feels better   ASSESSMENT:    1. Hyperlipidemia, unspecified hyperlipidemia type   2. Aortic atherosclerosis ([place])   3. Chronic obstructive pulmonary disease, unspecified COPD type ([place])   4. Hypertension, unspecified type   5. History of pericarditis     PLAN:    HLD Aortic atherosclerosis - we discussed dietary changers and lifestyle changes at length - if still elevated will get rosvastatin 5 mg (06/29/21 fasting lipids)  History of myopericarditis PAF without further episodes outside of acute  myopericarditis - we have discussed new palpitations for Ziop[place]; she would have elevated CHADSAVSC and start DOAC if new AF  COPD and Tobacco use - we have discussed smoking cessation again  HTN - BP at goal  One year me or APP  Medication Adjustments/Labs and Tests Ordered: Current medicines are reviewed at length with the patient today.  Concerns regarding medicines are outlined above.  Orders Placed This Encounter  Procedures   Lipid panel   EKG 12-Lead    No orders of the defined types were placed in this encounter.    Patient Instructions  Medication Instructions:  Your physician recommends that you continue on your current medications as directed. Please refer to the Current Medication list given to you today.  *If you need a refill on your cardiac medications before your next appointment, please call your pharmacy*   Lab Work: Fasting Lipids  in May If you have labs (blood work) drawn today and your tests are completely normal, you will receive your results only by: [place] (if you have MyChart) OR A paper copy in the mail If you have any lab test that is abnormal or we need to change your treatment, we will call you to review the results.   Follow-Up: At [facility], you and your health needs are our priority.  As part of our continuing mission to provide you with exceptional heart care, we have created designated Provider Care Teams.  These Care Teams include your primary Cardiologist (physician) and Advanced Practice Providers (APPs -  Physician Assistants and Nurse Practitioners) who all work together to provide you with the care you need, when you need it.  Your next appointment:   1 year(s)  The format for your next appointment:   In Person  Provider:   Werner Lean, MD     Important Information About Sugar         Signed, Werner Lean, MD  02/12/2022 5:13 PM    [place]

## 2022-03-02 DIAGNOSIS — M5114 Intervertebral disc disorders with radiculopathy, thoracic region: Secondary | ICD-10-CM | POA: Diagnosis not present

## 2022-03-02 DIAGNOSIS — M5414 Radiculopathy, thoracic region: Secondary | ICD-10-CM | POA: Diagnosis not present

## 2022-03-22 ENCOUNTER — Telehealth (INDEPENDENT_AMBULATORY_CARE_PROVIDER_SITE_OTHER): Payer: Medicare HMO | Admitting: Internal Medicine

## 2022-03-22 ENCOUNTER — Telehealth: Payer: Self-pay | Admitting: Internal Medicine

## 2022-03-22 ENCOUNTER — Other Ambulatory Visit: Payer: Self-pay | Admitting: Internal Medicine

## 2022-03-22 VITALS — HR 71 | Wt 119.8 lb

## 2022-03-22 DIAGNOSIS — J0100 Acute maxillary sinusitis, unspecified: Secondary | ICD-10-CM

## 2022-03-22 MED ORDER — AZITHROMYCIN 250 MG PO TABS: ORAL_TABLET | ORAL | 0 refills | Status: AC

## 2022-03-22 MED ORDER — PREDNISONE 10 MG PO TABS: ORAL_TABLET | ORAL | 0 refills | Status: DC

## 2022-03-22 NOTE — Telephone Encounter (Signed)
Megan Williams  [phone]  [name] called to say she has runny nose and green mucus, since Friday and her COVID test is negative today

## 2022-03-22 NOTE — Telephone Encounter (Signed)
scheduled

## 2022-03-22 NOTE — Progress Notes (Signed)
I connected with Megan Williams on 03/22/22 at 12:30 PM EST by video enabled telemedicine visit and verified that I am speaking with the correct person using two identifiers.   I discussed the limitations, risks, security and privacy concerns of performing an evaluation and management service by telemedicine and the availability of in-person appointments. I also discussed with the patient that there may be a patient responsible charge related to this service. The patient expressed understanding and agreed to proceed.   Other persons participating in the visit and their role in the encounter: Medical scribe, Eugene Gavia   Patient's location: Home  Provider's location: Clinic   Chief Complaint: Flu-like symptoms Subjective:    Patient ID: Megan Williams , female    DOB: Aug 10, 1945, 78 y.o.    MRN: [id number]   76 y.o. female presents today for: Upper respiratory congestion  She reports lingering sinus pressure and congestion x3 weeks. She started having green discolored sinus drainage x2 days ago for the first time with this illness. She tried Sudafed and Mucinex Max. She denies any fever, chills, cough, or sore throat.       Family History  Adopted: Yes  Family history unknown: Yes    Past Medical History:  Diagnosis Date   Allergy    Anxiety    Arthritis    Atrial fibrillation ([place])    Complication of anesthesia    "difficulty waking up. I could hear them but I couldn't wake up"   COPD (chronic obstructive pulmonary disease) ([place])    Depression    Emphysema    Esophagitis    Herpes simplex    Hypertension    Hypothyroidism    Migraines    Osteopenia    Vitamin D deficiency      Social History   Social History Narrative   Lives at home with husband.   Right-handed.   1 cup caffeine daily.    Patient Care Team: Elby Showers, MD as PCP - General (Internal Medicine) Werner Lean, MD as PCP - Cardiology (Cardiology)   Review of Systems   Constitutional:  Negative for chills, fever and malaise/fatigue.  HENT:  Positive for congestion and sinus pain. Negative for sore throat.   Eyes:  Negative for blurred vision.  Respiratory:  Negative for cough and shortness of breath.   Cardiovascular:  Negative for chest pain, palpitations and leg swelling.  Gastrointestinal:  Negative for vomiting.  Musculoskeletal:  Negative for back pain.  Skin:  Negative for rash.  Neurological:  Negative for loss of consciousness and headaches.        Objective:   Vitals: Pulse 71   Wt 160 lb 12.8 oz (72.9 kg)   SpO2 97%   BMI 29.41 kg/m    Physical Exam Vitals and nursing note reviewed.  Constitutional:      General: She is not in acute distress.    Appearance: Normal appearance. She is not ill-appearing or toxic-appearing.  HENT:     Head: Normocephalic and atraumatic.  Musculoskeletal:     Cervical back: Normal range of motion.  Neurological:     General: No focal deficit present.     Mental Status: She is alert and oriented to person, place, and time. Mental status is at baseline.  Psychiatric:        Mood and Affect: Mood normal.        Behavior: Behavior normal.        Thought Content: Thought content normal.  Judgment: Judgment normal.         Assessment & Plan:   Acute Maxillary sinusitis: Take Zithromax Z-PAK 2 tabs day 1 followed by 1 tab days 2 through 5.  Take prednisone 10 mg tablets (#21) starting with 6 tablets day 1 and decreasing by 1 tablet daily i.e. 6-5-4-3-2-1 taper.   I,Alexis Herring,acting as a Education administrator for Elby Showers, MD.,have documented all relevant documentation on the behalf of Elby Showers, MD,as directed by  Elby Showers, MD while in the presence of Elby Showers, MD.   I, Elby Showers, MD, have reviewed all documentation for this visit. The documentation on 03/24/22 for the exam, diagnosis, procedures, and orders are all accurate and complete.

## 2022-03-22 NOTE — Patient Instructions (Signed)
We are sorry you are not feeling well today.  Please take Zithromax Z-PAK 2 tabs day 1 followed by 1 tab days 2 through 5.  Take prednisone 10 mg (number 21 tablets) and tapering course starting with 6 tabs day 1 and decreasing by 1 tab daily i.e. 6-5-4-3-2-1 taper.

## 2022-03-25 ENCOUNTER — Encounter: Payer: Self-pay | Admitting: Internal Medicine

## 2022-03-25 NOTE — Telephone Encounter (Signed)
Megan Williams called to say she is just about finished with her medicine and she is not any better.

## 2022-03-25 NOTE — Telephone Encounter (Signed)
After speaking with Dr Renold Genta she said medicine would stay in system several days after finishing and she needs to give a few more days and if she is not better by first of the week to call back. I called patient and was unable to reach her so I left this message on her voice mail.

## 2022-04-07 ENCOUNTER — Telehealth: Payer: Self-pay

## 2022-04-07 NOTE — Telephone Encounter (Signed)
Patient called stating her sinuses are not better she is blowing her nose and seeing green clots she also has bruises under her cheek and asked if she could be seen tomorrow since she has to bring Bill in for his office visit

## 2022-04-07 NOTE — Telephone Encounter (Signed)
scheduled

## 2022-04-08 ENCOUNTER — Encounter: Payer: Self-pay | Admitting: Internal Medicine

## 2022-04-08 ENCOUNTER — Ambulatory Visit (INDEPENDENT_AMBULATORY_CARE_PROVIDER_SITE_OTHER): Payer: Medicare HMO | Admitting: Internal Medicine

## 2022-04-08 VITALS — BP 108/72 | HR 68 | Temp 98.7°F | Ht 62.0 in | Wt 120.1 lb

## 2022-04-08 DIAGNOSIS — J329 Chronic sinusitis, unspecified: Secondary | ICD-10-CM | POA: Diagnosis not present

## 2022-04-08 MED ORDER — LEVOFLOXACIN 250 MG PO TABS: 250.0000 mg | ORAL_TABLET | Freq: Every day | ORAL | 0 refills | Status: DC

## 2022-04-08 MED ORDER — CEFTRIAXONE SODIUM 1 G IJ SOLR
1.0000 g | Freq: Once | INTRAMUSCULAR | Status: AC
  Administered 2022-04-08: 1 g via INTRAMUSCULAR

## 2022-04-08 NOTE — Patient Instructions (Signed)
1 g IM Rocephin given in office today.  Take Levaquin 250 mg daily with food for 10 days.

## 2022-04-08 NOTE — Progress Notes (Addendum)
Patient Care Team: Elby Showers, MD as PCP - General (Internal Medicine) Werner Lean, MD as PCP - Cardiology (Cardiology)  Visit Date: 04/08/22  Subjective:    Patient ID: Megan Williams , Female   DOB: 03-08-1945, 77 y.o.    MRN: [id number]   76 y.o. Female presents today for prolonged sinus infection. Patient has a past medical history of hypertension, hypothyroidism, migraines, osteopenia, arthritis, anxiety and depression, emphysema, esophagitis.  Experiencing continued sinus infection symptoms since virtual visit on 03/22/22. Treated with Azithromycin and Prednisone. Now coughing with large amounts of green sputum. Denies history of frequent sinus infections. Having difficulty sleeping due to symptoms.   Past Medical History:  Diagnosis Date   Allergy    Anxiety    Arthritis    Atrial fibrillation ([place])    Complication of anesthesia    "difficulty waking up. I could hear them but I couldn't wake up"   COPD (chronic obstructive pulmonary disease) ([place])    Depression    Emphysema    Esophagitis    Herpes simplex    Hypertension    Hypothyroidism    Migraines    Osteopenia    Vitamin D deficiency      Family History  Adopted: Yes  Family history unknown: Yes    Social History   Social History Narrative   Lives at home with husband.   Right-handed.   1 cup caffeine daily.      Review of Systems  Constitutional:  Negative for fever and malaise/fatigue.  HENT:  Negative for congestion.   Eyes:  Negative for blurred vision.  Respiratory:  Positive for cough and sputum production ([place]). Negative for shortness of breath.   Cardiovascular:  Negative for chest pain, palpitations and leg swelling.  Gastrointestinal:  Negative for vomiting.  Musculoskeletal:  Negative for back pain.  Skin:  Negative for rash.  Neurological:  Negative for loss of consciousness and headaches.        Objective:   Vitals: BP 108/72   Pulse 68   Temp 98.7 F  (37.1 C) (Tympanic)   Ht '5\' 2"'$  (1.575 m)   Wt 120 lb 1.9 oz (54.5 kg)   SpO2 98%   BMI 21.97 kg/m    Physical Exam Vitals and nursing note reviewed.  Constitutional:      General: She is not in acute distress.    Appearance: Normal appearance. She is not toxic-appearing.  HENT:     Head: Normocephalic and atraumatic.  Pulmonary:     Effort: Pulmonary effort is normal.  Skin:    General: Skin is warm and dry.  Neurological:     Mental Status: She is alert and oriented to person, place, and time. Mental status is at baseline.  Psychiatric:        Mood and Affect: Mood normal.        Behavior: Behavior normal.        Thought Content: Thought content normal.        Judgment: Judgment normal.       Results:   Studies obtained and personally reviewed by me:    Labs:       Component Value Date/Time   NA 144 10/19/2021 0945   K 5.0 10/19/2021 0945   CL 107 10/19/2021 0945   CO2 27 10/19/2021 0945   GLUCOSE 99 10/19/2021 0945   BUN 19 12/10/2021 1138   CREATININE 1.18 (H) 12/10/2021 1138   CALCIUM 10.2 10/19/2021 0945  PROT 6.9 10/19/2021 0945   ALBUMIN 4.0 10/07/2016 0925   AST 18 10/19/2021 0945   ALT 8 10/19/2021 0945   ALKPHOS 49 10/07/2016 0925   BILITOT 0.4 10/19/2021 0945   GFRNONAA >60 10/30/2020 0758   GFRNONAA 52 (L) 05/20/2020 1619   GFRAA 60 05/20/2020 1619     Lab Results  Component Value Date   WBC 6.1 10/19/2021   HGB 14.7 10/19/2021   HCT 43.4 10/19/2021   MCV 96.9 10/19/2021   PLT 287 10/19/2021    Lab Results  Component Value Date   CHOL 175 10/19/2021   HDL 67 10/19/2021   LDLCALC 87 10/19/2021   TRIG 116 10/19/2021   CHOLHDL 2.6 10/19/2021    Lab Results  Component Value Date   HGBA1C 4.9 10/07/2016     Lab Results  Component Value Date   TSH 0.49 12/10/2021      Assessment & Plan:   Protracted Maxillary Sinusitis: Prescribed Levaquin 250 mg once daily x 10 days. Administered Rocephin 1 g  injection.    I,Alexander Ruley,acting as a Education administrator for Elby Showers, MD.,have documented all relevant documentation on the behalf of Elby Showers, MD,as directed by  Elby Showers, MD while in the presence of Elby Showers, MD.  I, Elby Showers, MD, have reviewed all documentation for this visit. The documentation on 04/08/22 for the exam, diagnosis, procedures, and orders are all accurate and complete.

## 2022-04-12 DIAGNOSIS — N3281 Overactive bladder: Secondary | ICD-10-CM | POA: Diagnosis not present

## 2022-04-12 DIAGNOSIS — N319 Neuromuscular dysfunction of bladder, unspecified: Secondary | ICD-10-CM | POA: Diagnosis not present

## 2022-04-19 NOTE — Progress Notes (Deleted)
Patient Care Team: Elby Showers, MD as PCP - General (Internal Medicine) Werner Lean, MD as PCP - Cardiology (Cardiology)  Visit Date: 04/26/22  Subjective:    Patient ID: Megan Williams , Female   DOB: 11/24/1945, 77 y.o.    MRN: [id number]   76 y.o. Female presents today for 6 month follow-up. Patient has a past medical history of anxiety and depression, arthritis, atrial fibrillation, COPD, depression, emphysema, esophagitis, Herpes simplex, hypertension, hypothyroidism, migraines, osteopenia, Vitamin D deficiency, dementia and mild memory loss.  Seen on 04/08/22 for sinusitis. Treated with  Levaquin and IM Rocephin. Symptoms have resolved. Feels well in general now.  Denies any  recent vertigo episodes.  History of hypothyroidism treated with Synthroid 100 mcg daily. TSH low at 0.31 on 04/23/22.  History of pericarditis managed by Cardiologist,  Dr. Rudean Haskell.This has resolved. She is on Inderal 40 mg twice daily. This helps HTN and palpitations.  History of osteopenia treated with Prolia 60 mg every six months. Managed by Orthopedist, Dr. Wandra Feinstein. T-score at -1.7 in 3/23.  She is on Effexor XR and Trazodone for anxiety/ depression  History of anxiety and depression treated with Valium 2.5-5 mg daily as needed, Desyrel 200 mg daily at bedtime, Effexor-XR 300 mg daily with breakfast. Reports mood is stable. Husband has been ill, which has presented additional stress but she is managing well.   Past Medical History:  Diagnosis Date   Allergy    Anxiety    Arthritis    Atrial fibrillation ([place])    Complication of anesthesia    "difficulty waking up. I could hear them but I couldn't wake up"   COPD (chronic obstructive pulmonary disease) ([place])    Depression    Emphysema    Esophagitis    Herpes simplex    Hypertension    Hypothyroidism    Migraines    Osteopenia    Vitamin D deficiency      Family History  Adopted: Yes  Family history  unknown: Yes    Social History   Social History Narrative   Lives at home with husband.   Right-handed.   1 cup caffeine daily.      Review of Systems  Constitutional:  Negative for fever and malaise/fatigue.  HENT:  Negative for congestion.   Eyes:  Negative for blurred vision.  Respiratory:  Negative for cough and shortness of breath.   Cardiovascular:  Negative for chest pain, palpitations and leg swelling.  Gastrointestinal:  Negative for vomiting.  Musculoskeletal:  Negative for back pain.  Skin:  Negative for rash.  Neurological:  Negative for dizziness, loss of consciousness and headaches.        Objective:   Vitals: BP 110/72   Pulse 98   Temp 98.3 F (36.8 C) (Tympanic)   Ht '5\' 2"'$  (1.575 m)   Wt 121 lb 6.4 oz (55.1 kg)   SpO2 98%   BMI 22.20 kg/m    Physical Exam Vitals and nursing note reviewed.  Constitutional:      General: She is not in acute distress.    Appearance: Normal appearance. She is not toxic-appearing.  HENT:     Head: Normocephalic and atraumatic.     Right Ear: Hearing, tympanic membrane, ear canal and external ear normal.     Left Ear: Hearing, tympanic membrane, ear canal and external ear normal.  Neck:     Thyroid: No thyroid mass, thyromegaly or thyroid tenderness.  Vascular: No carotid bruit.  Cardiovascular:     Rate and Rhythm: Normal rate and regular rhythm. No extrasystoles are present.    Heart sounds: Normal heart sounds. No murmur heard.    No friction rub. No gallop.  Pulmonary:     Effort: Pulmonary effort is normal. No respiratory distress.     Breath sounds: Normal breath sounds. No wheezing or rales.  Musculoskeletal:     Right lower leg: No edema.     Left lower leg: No edema.  Lymphadenopathy:     Cervical: No cervical adenopathy.  Skin:    General: Skin is warm and dry.  Neurological:     Mental Status: She is alert and oriented to person, place, and time. Mental status is at baseline.  Psychiatric:         Mood and Affect: Mood normal.        Behavior: Behavior normal.        Thought Content: Thought content normal.        Judgment: Judgment normal.       Results:   Studies obtained and personally reviewed by me:  05/05/21 DEXA scan showed BMD measured at Femur Neck Left is 0.805 g/cm2 with a T-score of -1.7. This patient is considered osteopenic/low bone mass according to [facility]) criteria.   Labs:       Component Value Date/Time   NA 144 10/19/2021 0945   K 5.0 10/19/2021 0945   CL 107 10/19/2021 0945   CO2 27 10/19/2021 0945   GLUCOSE 99 10/19/2021 0945   BUN 19 12/10/2021 1138   CREATININE 1.13 (H) 04/23/2022 0923   CALCIUM 10.2 10/19/2021 0945   PROT 6.5 04/23/2022 0923   ALBUMIN 4.0 10/07/2016 0925   AST 15 04/23/2022 0923   ALT 7 04/23/2022 0923   ALKPHOS 49 10/07/2016 0925   BILITOT 0.4 04/23/2022 0923   GFRNONAA >60 10/30/2020 0758   GFRNONAA 52 (L) 05/20/2020 1619   GFRAA 60 05/20/2020 1619     Lab Results  Component Value Date   WBC 6.1 10/19/2021   HGB 14.7 10/19/2021   HCT 43.4 10/19/2021   MCV 96.9 10/19/2021   PLT 287 10/19/2021    Lab Results  Component Value Date   CHOL 169 04/23/2022   HDL 57 04/23/2022   LDLCALC 88 04/23/2022   TRIG 138 04/23/2022   CHOLHDL 3.0 04/23/2022    Lab Results  Component Value Date   HGBA1C 4.9 10/07/2016     Lab Results  Component Value Date   TSH 0.31 (L) 04/23/2022      Assessment & Plan:   Hypothyroidism: TSH low at 0.31 on 04/23/22. Decreased Synthroid to 88 mcg daily.Recheck in April.  History of osteoporosis currently treated with Prolia by Dr. Layne Benton, Orthopedist  Situational stress with husband who has been hospitalized  History of anxiety and depression  Mild memory loss  History of hypertension-she is currently on Inderal and blood pressure is stable  History of pericarditis no associated paroxysmal atrial fibs without further episodes  Plan: Synthroid  decreased to 0.88 mg daily with TSH only to be drawn in April.  Otherwise return in 6 months or as needed.  Medicare wellness visit due in August.  I,Alexander Ruley,acting as a scribe for Elby Showers, MD.,have documented all relevant documentation on the behalf of Elby Showers, MD,as directed by  Elby Showers, MD while in the presence of Elby Showers, MD.   Martie Lee  Gerrianne Scale, MD, have reviewed all documentation for this visit. The documentation on 04/26/22 for the exam, diagnosis, procedures, and orders are all accurate and complete.

## 2022-04-22 ENCOUNTER — Other Ambulatory Visit: Payer: Self-pay | Admitting: Internal Medicine

## 2022-04-22 DIAGNOSIS — B009 Herpesviral infection, unspecified: Secondary | ICD-10-CM

## 2022-04-23 ENCOUNTER — Other Ambulatory Visit: Payer: Medicare HMO

## 2022-04-23 DIAGNOSIS — E7849 Other hyperlipidemia: Secondary | ICD-10-CM

## 2022-04-23 DIAGNOSIS — E039 Hypothyroidism, unspecified: Secondary | ICD-10-CM

## 2022-04-23 DIAGNOSIS — R7989 Other specified abnormal findings of blood chemistry: Secondary | ICD-10-CM | POA: Diagnosis not present

## 2022-04-24 LAB — HEPATIC FUNCTION PANEL
AG Ratio: 1.4 (calc) (ref 1.0–2.5)
ALT: 7 U/L (ref 6–29)
AST: 15 U/L (ref 10–35)
Albumin: 3.8 g/dL (ref 3.6–5.1)
Alkaline phosphatase (APISO): 36 U/L — ABNORMAL LOW (ref 37–153)
Bilirubin, Direct: 0.1 mg/dL (ref 0.0–0.2)
Globulin: 2.7 g/dL (calc) (ref 1.9–3.7)
Indirect Bilirubin: 0.3 mg/dL (calc) (ref 0.2–1.2)
Total Bilirubin: 0.4 mg/dL (ref 0.2–1.2)
Total Protein: 6.5 g/dL (ref 6.1–8.1)

## 2022-04-24 LAB — LIPID PANEL
Cholesterol: 169 mg/dL (ref <200)
HDL: 57 mg/dL (ref >=50)
LDL Cholesterol (Calc): 88 mg/dL (calc)
Non-HDL Cholesterol (Calc): 112 mg/dL (calc) (ref <130)
Total CHOL/HDL Ratio: 3 (calc) (ref <5.0)
Triglycerides: 138 mg/dL (ref <150)

## 2022-04-24 LAB — TSH: TSH: 0.31 mIU/L — ABNORMAL LOW (ref 0.40–4.50)

## 2022-04-24 LAB — CREATININE, SERUM: Creat: 1.13 mg/dL — ABNORMAL HIGH (ref 0.60–1.00)

## 2022-04-26 ENCOUNTER — Ambulatory Visit (INDEPENDENT_AMBULATORY_CARE_PROVIDER_SITE_OTHER): Payer: Medicare HMO | Admitting: Internal Medicine

## 2022-04-26 ENCOUNTER — Encounter: Payer: Self-pay | Admitting: Internal Medicine

## 2022-04-26 VITALS — BP 110/72 | HR 98 | Temp 98.3°F | Ht 62.0 in | Wt 121.4 lb

## 2022-04-26 DIAGNOSIS — Z8679 Personal history of other diseases of the circulatory system: Secondary | ICD-10-CM | POA: Diagnosis not present

## 2022-04-26 DIAGNOSIS — R7989 Other specified abnormal findings of blood chemistry: Secondary | ICD-10-CM | POA: Diagnosis not present

## 2022-04-26 DIAGNOSIS — M810 Age-related osteoporosis without current pathological fracture: Secondary | ICD-10-CM | POA: Diagnosis not present

## 2022-04-26 DIAGNOSIS — F419 Anxiety disorder, unspecified: Secondary | ICD-10-CM

## 2022-04-26 DIAGNOSIS — F439 Reaction to severe stress, unspecified: Secondary | ICD-10-CM

## 2022-04-26 DIAGNOSIS — G250 Essential tremor: Secondary | ICD-10-CM

## 2022-04-26 DIAGNOSIS — E039 Hypothyroidism, unspecified: Secondary | ICD-10-CM | POA: Diagnosis not present

## 2022-04-26 DIAGNOSIS — R413 Other amnesia: Secondary | ICD-10-CM

## 2022-04-26 DIAGNOSIS — I10 Essential (primary) hypertension: Secondary | ICD-10-CM | POA: Diagnosis not present

## 2022-04-26 DIAGNOSIS — F32A Depression, unspecified: Secondary | ICD-10-CM

## 2022-04-26 MED ORDER — LEVOTHYROXINE SODIUM 88 MCG PO TABS: 88.0000 ug | ORAL_TABLET | Freq: Every day | ORAL | 0 refills | Status: DC

## 2022-04-26 NOTE — Patient Instructions (Addendum)
Decrease Synthroid to 0.88 mg daily and follow-up with TSH only in April.  Otherwise continue current medications.  Medicare wellness visit due in August.

## 2022-04-27 ENCOUNTER — Encounter: Payer: Self-pay | Admitting: Internal Medicine

## 2022-04-27 NOTE — Progress Notes (Signed)
Patient Care Team: Elby Showers, MD as PCP - General (Internal Medicine) Werner Lean, MD as PCP - Cardiology (Cardiology)  Visit Date: 04/27/22  Subjective:    Patient ID: Megan Williams , Female   DOB: [birth year], 77 y.o.    MRN: [id number]   76 y.o. Female presents today for 6 month follow-up. Patient has a past medical history of anxiety and depression, arthritis, atrial fibrillation, COPD, depression, emphysema, esophagitis, Herpes simplex, hypertension, hypothyroidism, migraines, osteopenia, Vitamin D deficiency, dementia and mild memory loss.   Seen on 04/08/22 for sinusitis. Treated with  Levaquin and IM Rocephin. Symptoms have resolved. Feels well in general now.   Denies any  recent vertigo episodes.   History of hypothyroidism treated with Synthroid 100 mcg daily. TSH low at 0.31 on 04/23/22.   History of pericarditis managed by Cardiologist,  Dr. Rudean Haskell.This has resolved. She is on Inderal 40 mg twice daily. This helps HTN and palpitations.   History of osteopenia treated with Prolia 60 mg every six months. Managed by Orthopedist, Dr. Wandra Feinstein. T-score at -1.7 in 3/23.   She is on Effexor XR and Trazodone for anxiety/ depression   History of anxiety and depression treated with Valium 2.5-5 mg daily as needed, Desyrel 200 mg daily at bedtime, Effexor-XR 300 mg daily with breakfast. Reports mood is stable. Husband has been ill, which has presented additional stress but she is managing well.   Past Medical History:  Diagnosis Date   Allergy    Anxiety    Arthritis    Atrial fibrillation ([place])    Complication of anesthesia    "difficulty waking up. I could hear them but I couldn't wake up"   COPD (chronic obstructive pulmonary disease) ([place])    Depression    Emphysema    Esophagitis    Herpes simplex    Hypertension    Hypothyroidism    Migraines    Osteopenia    Vitamin D deficiency      Family History  Adopted: Yes  Family  history unknown: Yes    Social History   Social History Narrative   Lives at home with husband.   Right-handed.   1 cup caffeine daily.      Review of Systems  Constitutional:  Negative for fever and malaise/fatigue.  HENT:  Negative for congestion.   Eyes:  Negative for blurred vision.  Respiratory:  Negative for cough and shortness of breath.   Cardiovascular:  Negative for chest pain, palpitations and leg swelling.  Gastrointestinal:  Negative for vomiting.  Musculoskeletal:  Negative for back pain.  Skin:  Negative for rash.  Neurological:  Negative for loss of consciousness and headaches.        Objective:   Vitals: BP 110/72   Pulse 98   Temp 98.3 F (36.8 C) (Tympanic)   Ht '5\' 2"'$  (1.575 m)   Wt 121 lb 6.4 oz (55.1 kg)   SpO2 98%   BMI 22.20 kg/m    Physical Exam Vitals and nursing note reviewed.  Constitutional:      General: She is not in acute distress.    Appearance: Normal appearance. She is not toxic-appearing.  HENT:     Head: Normocephalic and atraumatic.  Neck:     Thyroid: No thyroid mass, thyromegaly or thyroid tenderness.     Vascular: No carotid bruit.  Cardiovascular:     Rate and Rhythm: Normal rate and regular rhythm. No extrasystoles are present.  Heart sounds: Normal heart sounds. No murmur heard.    No gallop.  Pulmonary:     Effort: Pulmonary effort is normal. No respiratory distress.     Breath sounds: Normal breath sounds. No wheezing or rales.  Lymphadenopathy:     Cervical: No cervical adenopathy.  Skin:    General: Skin is warm and dry.  Neurological:     Mental Status: She is alert and oriented to person, place, and time. Mental status is at baseline.  Psychiatric:        Mood and Affect: Mood normal.        Behavior: Behavior normal.        Thought Content: Thought content normal.        Judgment: Judgment normal.       Results:   Studies obtained and personally reviewed by me:  05/05/21 DEXA scan showed BMD  measured at Femur Neck Left is 0.805 g/cm2 with a T-score of -1.7. This patient is considered osteopenic/low bone mass according to [facility]) criteria.   Labs:       Component Value Date/Time   NA 144 10/19/2021 0945   K 5.0 10/19/2021 0945   CL 107 10/19/2021 0945   CO2 27 10/19/2021 0945   GLUCOSE 99 10/19/2021 0945   BUN 19 12/10/2021 1138   CREATININE 1.13 (H) 04/23/2022 0923   CALCIUM 10.2 10/19/2021 0945   PROT 6.5 04/23/2022 0923   ALBUMIN 4.0 10/07/2016 0925   AST 15 04/23/2022 0923   ALT 7 04/23/2022 0923   ALKPHOS 49 10/07/2016 0925   BILITOT 0.4 04/23/2022 0923   GFRNONAA >60 10/30/2020 0758   GFRNONAA 52 (L) 05/20/2020 1619   GFRAA 60 05/20/2020 1619     Lab Results  Component Value Date   WBC 6.1 10/19/2021   HGB 14.7 10/19/2021   HCT 43.4 10/19/2021   MCV 96.9 10/19/2021   PLT 287 10/19/2021    Lab Results  Component Value Date   CHOL 169 04/23/2022   HDL 57 04/23/2022   LDLCALC 88 04/23/2022   TRIG 138 04/23/2022   CHOLHDL 3.0 04/23/2022    Lab Results  Component Value Date   HGBA1C 4.9 10/07/2016     Lab Results  Component Value Date   TSH 0.31 (L) 04/23/2022      Assessment & Plan:   Hypothyroidism: TSH low at 0.31 on 04/23/22. Decreased Synthroid to 88 mcg daily.Recheck in April.   History of osteoporosis currently treated with Prolia by Dr. Layne Benton, Orthopedist   Situational stress with husband who has been hospitalized   History of anxiety and depression   Mild memory loss   History of hypertension-she is currently on Inderal and blood pressure is stable   History of pericarditis no associated paroxysmal atrial fibs without further episodes   Plan: Synthroid decreased to 0.88 mg daily with TSH only to be drawn in April.  Otherwise return in 6 months or as needed.  Medicare wellness visit due in August.   I,Alexander Ruley,acting as a scribe for Elby Showers, MD.,have documented all relevant documentation  on the behalf of Elby Showers, MD,as directed by  Elby Showers, MD while in the presence of Elby Showers, MD.     I, Elby Showers, MD, have reviewed all documentation for this visit. The documentation on 04/26/22 for the exam, diagnosis, procedures, and orders are all accurate and complete.   IElby Showers, MD, have reviewed all documentation for  this visit. The documentation on 04/27/22 for the exam, diagnosis, procedures, and orders are all accurate and complete.

## 2022-05-06 ENCOUNTER — Ambulatory Visit: Payer: Medicare HMO | Admitting: Neurology

## 2022-05-20 ENCOUNTER — Telehealth: Payer: Self-pay | Admitting: Internal Medicine

## 2022-05-20 DIAGNOSIS — F429 Obsessive-compulsive disorder, unspecified: Secondary | ICD-10-CM | POA: Diagnosis not present

## 2022-05-20 DIAGNOSIS — F3341 Major depressive disorder, recurrent, in partial remission: Secondary | ICD-10-CM | POA: Diagnosis not present

## 2022-05-20 DIAGNOSIS — F411 Generalized anxiety disorder: Secondary | ICD-10-CM | POA: Diagnosis not present

## 2022-05-20 NOTE — Telephone Encounter (Signed)
Megan Williams [phone]  Lacee called to say, where she has been getting her Prolia injections is not going to be doing that anymore and they are being referred somewhere else and she wants to know if she can come her to get?

## 2022-05-21 NOTE — Telephone Encounter (Signed)
Called patient to let her know we would be willing to take over her Prolia, however I would need her to get all of the information sent to [place] on where she was getting it and if it had been approved and when next one is due. She was receiving injections from Dr Hal Morales at [facility].

## 2022-05-27 NOTE — Telephone Encounter (Signed)
Received information from [facility], called and LVM that we should be able to take over for April dose.

## 2022-05-31 NOTE — Progress Notes (Unsigned)
Patient: Megan Williams Date of Birth: [birth year]  Reason for Visit: Follow up History from: Patient Primary Neurologist: Dr.Yan  ASSESSMENT AND PLAN 77 y.o. year old female   30.  Recurrent episode of sudden onset vertigo, also reported couple years history of gradual onset bilateral hearing loss 2.  History of myopericarditis in September 2022, transient atrial fibrillation, was treated with IV amiodarone followed by p.o. then tapered off. 3.  Mild cognitive impairment 4.  Essential tremor  -Doing well, MMSE 30/30, will continue the Namenda, reports without it has noted subjective memory decline -Tremor is stable, continue propranolol 40 mg twice daily, also has Valium as needed from psychiatry -No further vertigo episodes, now has hearing aids that she is really enjoying -EEG was normal May 2023 -MRI of the brain with and without contrast/IAC was unremarkable May 2023 -Follow-up in 1 year or sooner if needed  HISTORY  Megan Williams 77 years old right-handed female, seen in refer by her primary care physician Dr. Tedra Senegal for evaluation of tremor, memory loss, and gait problems     Past medical history Longtime smoker, Depression, anxiety, Hypertension History of cervical decompression surgery, Chronic low back pain   She used to work as a Radio broadcast assistant, and Energy manager   Around 2015, she began to notice memory trouble, she needs family to remind her multiple times, tends to forget people's name, phone number, she used to be able to remember all the congregation's name in the past, her memory trouble since 2 gradually getting worse, she still driving without getting lost   She reported history of migraine since young, for a while in September to October 2015, she has migraines almost on a daily basis, which has improved after stopped taking Trileptal   She also reported mild bilateral hands tremor since 2014, most noticeable when she holding a utensil, or write with  a pencil, she also noticed mild bilateral hands weakness, in Thanksgiving 2015, she has dropped her dishes to the floor, because of bilateral hands weakness,   Around 2015, she also noticed mild stiff unsteady gait, worsening urinary urgency, she denies significant neck pain, complains of moderate low back pain, she denies bilateral upper or lower extremity paresthesia   She is adopted, does not know family history, none of her children has tremor   MRI brain film in November 2016, mild generalized atrophy, mild supratentorium small vessel disease,    MRI of the cervical spine showed evidence of previous fusion from C4-7, 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   Laboratory evaluation failed to demonstrate treatable etiology, A1c was 4.9, normal B12, TSH   Her memory loss has been stable, recent visit in separate March 2023 Mini-Mental status was 30/30,  She continues to be on treatment for depression anxiety, including Effexor 300 mg daily, trazodone 200 mg at bedtime  She was admitted to the hospital in September 2022 for acute onset of chest pain, chest pressure, was diagnosed with acute myopericarditis, echo showed pericardial effusion, IVC collapse, small pericardial effusion measuring 0.82 cm, posterior to left ventricle, with significantly elevated ESR 117, C-reactive protein 23.5, there was also short duration of atrial fibrillation with rapid ventricular rate, IV load amiodarone, then transition to p.o., now no longer taking it, she was treated with colchicine, high-dose of ibuprofen up to 600 mg 3 times daily,   And following up with cardiology December 2022, she was asymptomatic, colchicine was stopped, tapered off ibuprofen  She had a history of 1 episode of sudden onset of vertigo more than 20 years ago, has been vertigo free for all those years  Since her myopericarditis in September 2022, she experienced multiple recurrent short lasting episode of  vertigo, most recent 1 was on May 26, 2021, while driving, she had sudden onset of spinning sensation, difficult to control her vehicle, she was able to manage pullover, symptoms last for few minutes, then resolved, she was able to finish her office visit for Prolia injection, 45 minutes later, she had another episode of longer sudden onset of vertigo, has to close her eyes, could not drive, no loss of consciousness,  She does complains of gradual onset bilateral hearing loss, TV volume has to be turned up from 12-18 now, she denies gait abnormality  Update November 05, 2021 SS: saw ENT, audiology testing, 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  Low suspicion for BPPV. No further episodes of vertigo. Went 1 time to vestibular rehab, stopped because the problem resolved. Tremor doing good, intermittently, takes propranolol 40 mg up to twice daily PRN, some days none at all. Doing overall well. On aspirin 81 mg.   Update June 01, 2022 SS: MMSE 30/30 today. Feels memory is fine, no major changes. Sometimes has to ask her husband the same question. Remains on namenda. Tremor is stable, she did take 1/2 tablet of Valium before coming here, from psychiatry, takes propranolol 40 mg BID, it helps tremor a lot. Getting around fine. No vertigo issues. Chronic neck/back issues, sees Dr. Ellene Route, gets 3 ESI a year. Takes aspirin 81 mg daily. TSH was low 0.31. now has hearing aides, since November, noticed big improvement.   REVIEW OF SYSTEMS: Out of a complete 14 system review of symptoms, the patient complains only of the following symptoms, and all other reviewed systems are negative.  See HPI  ALLERGIES: Allergies  Allergen Reactions   Doxycycline Hives, Other (See Comments), Itching and Nausea And Vomiting    Skin peeling  Other reaction(s): Stevens-Johnson Syndrome  Other Reaction(s): stevens-johnson syndrome  Skin peeling  Skin peeling    Other  reaction(s): Stevens-Johnson Syndrome   Latex Rash, Hives and Itching   Morphine Itching and Other (See Comments)    Other reaction(s): Confusion, Dizziness   Penicillins Hives, Diarrhea and Nausea Only    Has patient had a PCN reaction causing immediate rash, facial/tongue/throat swelling, SOB or lightheadedness with hypotension: No Has patient had a PCN reaction causing severe rash involving mucus membranes or skin necrosis: No Has patient had a PCN reaction that required hospitalization: No Has patient had a PCN reaction occurring within the last 10 years: No If all of the above answers are "NO", then may proceed with Cephalosporin use. Other reaction(s): Abdominal Pain   Grass Extracts [Gramineae Pollens] Cough and Itching    Other reaction(s): Eye Redness   Short Ragweed Pollen Ext Cough and Itching    Other reaction(s): Eye Redness, Other   Celebrex [Celecoxib] Other (See Comments)    Blistering, pain, swelling   Cephalosporins Nausea Only    Diarrhea, hives   Gabapentin     Causes TICS    Morphine And Related Itching    HOME MEDICATIONS: Outpatient Medications Prior to Visit  Medication Sig Dispense Refill   Cholecalciferol (VITAMIN D) 50 MCG (2000 UT) tablet Take 2,000 Units by mouth daily.     Cyanocobalamin (B-12) 1000 MCG SUBL Place 1,000 mcg under the tongue daily.  denosumab (PROLIA) 60 MG/ML SOLN injection Inject 60 mg into the skin every 6 (six) months. Administer in upper arm, thigh, or abdomen     diazepam (VALIUM) 5 MG tablet Take 2.5-5 mg by mouth daily as needed for anxiety.     estradiol (ESTRACE) 1 MG tablet Take 1 tablet (1 mg total) by mouth daily. 90 tablet 3   fenofibrate 160 MG tablet TAKE 1 TABLET BY MOUTH DAILY. 90 tablet 1   fexofenadine (ALLEGRA) 60 MG tablet Take 60 mg by mouth.     fluticasone (FLONASE) 50 MCG/ACT nasal spray Place 1 spray into both nostrils daily.     ibuprofen (IBU) 600 MG tablet TAKE 1 TABLET BY MOUTH 2 TIMES DAILY. 60 tablet  5   levothyroxine (SYNTHROID) 88 MCG tablet Take 1 tablet (88 mcg total) by mouth daily. 90 tablet 0   Melatonin 10 MG TABS Take 10 mg by mouth at bedtime.     memantine (NAMENDA) 10 MG tablet Take 1 tablet (10 mg total) by mouth 2 (two) times daily. TAKE 10 mg TABLET BY MOUTH 2 TIMES DAILY. 180 tablet 4   pantoprazole (PROTONIX) 40 MG tablet TAKE 1 TABLET BY MOUTH DAILY. 90 tablet 3   propranolol (INDERAL) 40 MG tablet Take 1 tablet (40 mg total) by mouth 2 (two) times daily. (Patient taking differently: Take 40 mg by mouth daily at 6 (six) AM.) 180 tablet 3   RESTASIS 0.05 % ophthalmic emulsion Place 1 drop into both eyes 2 (two) times daily.     rOPINIRole (REQUIP) 0.5 MG tablet Take 0.5 mg by mouth at bedtime as needed (as directed).     traZODone (DESYREL) 100 MG tablet Take 200 mg by mouth at bedtime.     valACYclovir (VALTREX) 500 MG tablet TAKE 1 TABLET BY MOUTH TWICE A DAY FOR 5 DAYS 10 tablet 5   venlafaxine XR (EFFEXOR-XR) 150 MG 24 hr capsule Take 300 mg by mouth daily with breakfast.     No facility-administered medications prior to visit.    PAST MEDICAL HISTORY: Past Medical History:  Diagnosis Date   Allergy    Anxiety    Arthritis    Atrial fibrillation    Complication of anesthesia    "difficulty waking up. I could hear them but I couldn't wake up"   COPD (chronic obstructive pulmonary disease)    Depression    Emphysema    Esophagitis    Herpes simplex    Hypertension    Hypothyroidism    Migraines    Osteopenia    Vitamin D deficiency     PAST SURGICAL HISTORY: Past Surgical History:  Procedure Laterality Date   BACK SURGERY  02/14/2018   lumbar 3-4  fusion   BREAST EXCISIONAL BIOPSY Left 1979   Benign    CATARACT EXTRACTION, BILATERAL     CERVICAL FUSION     CHOLECYSTECTOMY     KYPHOPLASTY  09/19/2015   Dr. Ellene Route  T12   LUMBAR [place] SURGERY  11/11/2017   L3-4   Thumb surg     THYROIDECTOMY     TONSILLECTOMY     VAGINAL HYSTERECTOMY      FAMILY HISTORY: Family History  Adopted: Yes  Family history unknown: Yes   SOCIAL HISTORY: Social History   Socioeconomic History   Marital status: Married    Spouse name: Not on file   Number of children: 2   Years of education: 14   Highest education level: Not on file  Occupational History   Occupation: Retired  Tobacco Use   Smoking status: Every Day   Smokeless tobacco: Never   Tobacco comments:    5  cigarettes daily  Vaping Use   Vaping Use: Never used  Substance and Sexual Activity   Alcohol use: Yes    Alcohol/week: 1.0 standard drink of alcohol    Types: 1 Glasses of wine per week    Comment: Rare   Drug use: No   Sexual activity: Not Currently    Birth control/protection: Surgical    Comment: INTERCOURSE AGE 77, SEXUAL PARTNERS LESS THAN 5  Other Topics Concern   Not on file  Social History Narrative   Lives at home with husband.   Right-handed.   1 cup caffeine daily.   Social Determinants of Health   Financial Resource Strain: Not on file  Food Insecurity: Not on file  Transportation Needs: Not on file  Physical Activity: Not on file  Stress: Not on file  Social Connections: Not on file  Intimate Partner Violence: Not on file   PHYSICAL EXAM  Vitals:   06/01/22 1046  BP: (!) 148/76  Pulse: 71  Weight: 121 lb (54.9 kg)  Height: 5\' 2"  (1.575 m)    Body mass index is 22.13 kg/m.    06/01/2022   10:47 AM 05/27/2021   10:27 AM 05/21/2020    9:57 AM  MMSE - Mini Mental State Exam  Orientation to time 5 5 5   Orientation to Place 5 5 5   Registration 3 3 3   Attention/ Calculation 5 5 5   Recall 3 3 3   Language- name 2 objects 2 2 2   Language- repeat 1 1 1   Language- follow 3 step command 3 3 3   Language- read & follow direction 1 1 1   Write a sentence 1 1 1   Copy design 1 1 1   Total score 30 30 30     Generalized: Well developed, in no acute distress  Neurological examination  Mentation: Alert oriented to time, place, history  taking. Follows all commands speech and language fluent Cranial nerve II-XII: Pupils were equal round reactive to light. Extraocular movements were full, visual field were full on confrontational test. Facial sensation and strength were normal.  Head turning and shoulder shrug were normal and symmetric. Motor: The motor testing reveals 5 over 5 strength of all 4 extremities. Good symmetric motor tone is noted throughout.  Sensory: Sensory testing is intact to soft touch on all 4 extremities. No evidence of extinction is noted.  Coordination: Cerebellar testing reveals good finger-nose-finger and heel-to-shin bilaterally.  No tremor noted. Gait and station: Gait is normal Reflexes: Deep tendon reflexes are symmetric and normal bilaterally.   DIAGNOSTIC DATA (LABS, IMAGING, TESTING) - I reviewed patient records, labs, notes, testing and imaging myself where available.  Lab Results  Component Value Date   WBC 6.1 10/19/2021   HGB 14.7 10/19/2021   HCT 43.4 10/19/2021   MCV 96.9 10/19/2021   PLT 287 10/19/2021      Component Value Date/Time   NA 144 10/19/2021 0945   K 5.0 10/19/2021 0945   CL 107 10/19/2021 0945   CO2 27 10/19/2021 0945   GLUCOSE 99 10/19/2021 0945   BUN 19 12/10/2021 1138   CREATININE 1.13 (H) 04/23/2022 0923   CALCIUM 10.2 10/19/2021 0945   PROT 6.5 04/23/2022 0923   ALBUMIN 4.0 10/07/2016 0925   AST 15 04/23/2022 0923   ALT 7 04/23/2022 0923   ALKPHOS 49 10/07/2016  0925   BILITOT 0.4 04/23/2022 0923   GFRNONAA >60 10/30/2020 0758   GFRNONAA 52 (L) 05/20/2020 1619   GFRAA 60 05/20/2020 1619   Lab Results  Component Value Date   CHOL 169 04/23/2022   HDL 57 04/23/2022   LDLCALC 88 04/23/2022   TRIG 138 04/23/2022   CHOLHDL 3.0 04/23/2022   Lab Results  Component Value Date   HGBA1C 4.9 10/07/2016   Lab Results  Component Value Date   VITAMINB12 >2,000 (H) 10/15/2019   Lab Results  Component Value Date   TSH 0.31 (L) 04/23/2022    Butler Denmark,  AGNP-C, DNP 06/01/2022, 10:53 AM [facility] [address] [place], [place] [address] [phone]

## 2022-06-01 ENCOUNTER — Ambulatory Visit: Payer: Medicare HMO | Admitting: Neurology

## 2022-06-01 ENCOUNTER — Encounter: Payer: Self-pay | Admitting: Neurology

## 2022-06-01 VITALS — BP 148/76 | HR 71 | Ht 62.0 in | Wt 121.0 lb

## 2022-06-01 DIAGNOSIS — G250 Essential tremor: Secondary | ICD-10-CM

## 2022-06-01 DIAGNOSIS — G3184 Mild cognitive impairment, so stated: Secondary | ICD-10-CM | POA: Diagnosis not present

## 2022-06-01 MED ORDER — MEMANTINE HCL 10 MG PO TABS
10.0000 mg | ORAL_TABLET | Freq: Two times a day (BID) | ORAL | 4 refills | Status: DC
Start: 2022-06-01 — End: 2023-06-02

## 2022-06-01 MED ORDER — PROPRANOLOL HCL 40 MG PO TABS: 40.0000 mg | ORAL_TABLET | Freq: Two times a day (BID) | ORAL | 3 refills | Status: DC

## 2022-06-07 ENCOUNTER — Other Ambulatory Visit: Payer: Medicare HMO

## 2022-06-09 ENCOUNTER — Other Ambulatory Visit (HOSPITAL_COMMUNITY): Payer: Self-pay

## 2022-06-09 NOTE — Telephone Encounter (Signed)
Not sure if we need prior authorization for Prolia shot for this patient. We are taking over her injection because Dr Cleophas Dunker is no longer going to be given them at [facility]. Her injection is due 06/11/2022. I have sent over a fax.with [employer]'s Patient ID [id number] information and insurance information.    Report-------  To:               Recipient at [phone] Subject:          Fw: Hp Scans Result:           The transmission was successful. Explanation:      All Pages Ok Pages Sent:       6 Connect Time:     3 minutes, 17 seconds Transmit Time:    06/09/2022 12:33 Transfer Rate:    14400 Status Code:      0000 Retry Count:      0 Job Id:           [id number] Unique Id:        [id number] Fax Line:         32 Fax Server:       [id number]

## 2022-06-09 NOTE — Telephone Encounter (Signed)
Pharmacy Patient Advocate Encounter   Received notification that prior authorization for Prolia 60mg  is required/requested.  Per Test Claim: $95.00 with pharmacy benefit   PA submitted on 06/09/22 to (ins) [employer] via CoverMyMeds Key or [facility]) confirmation # [id number] Status is pending

## 2022-06-10 ENCOUNTER — Other Ambulatory Visit: Payer: Medicare HMO

## 2022-06-10 DIAGNOSIS — E039 Hypothyroidism, unspecified: Secondary | ICD-10-CM

## 2022-06-10 LAB — TSH: TSH: 1.84 mIU/L (ref 0.40–4.50)

## 2022-06-10 NOTE — Telephone Encounter (Signed)
Patient Advocate Encounter  Prior Authorization for Prolia 60mg   has been approved.    PA# [id number] Effective dates: 05/21/22 through 03/01/23

## 2022-06-10 NOTE — Telephone Encounter (Signed)
Spoke with patient and let her know how much Prolia would be here if we order medication, she is going to check with [employer] to see about them ordering it and call me back.

## 2022-06-10 NOTE — Telephone Encounter (Signed)
LVM for patient to call me after lunch.

## 2022-06-10 NOTE — Telephone Encounter (Signed)
Pt ready for scheduling for Prolia on or after : 06/10/22  Out-of-pocket cost due at time of visit: $327  Primary: [employer] Prolia co-insurance: 20% Admin fee co-insurance: 20%  Secondary: --- Prolia co-insurance:  Admin fee co-insurance:   Medical Benefit Details: Date Benefits were checked: 06/09/22 Deductible: NO/ Coinsurance: 20%/ Admin Fee: 20%  Prior Auth: APPROVED PA# [id number]   Expiration Date: 03/01/2023   Pharmacy benefit: Copay $95 If patient wants fill through the pharmacy benefit please send prescription to: [employer], and include estimated need by date in rx notes. Pharmacy will ship medication directly to the office.  Patient NOT eligible for Prolia Copay Card. Copay Card can make patient's cost as little as $25. Link to apply: [email or web address]://[email or web address]  ** This summary of benefits is an estimation of the patient's out-of-pocket cost. Exact cost may very based on individual plan coverage.

## 2022-06-14 ENCOUNTER — Other Ambulatory Visit: Payer: Self-pay | Admitting: Internal Medicine

## 2022-06-17 ENCOUNTER — Telehealth: Payer: Self-pay | Admitting: Internal Medicine

## 2022-06-17 ENCOUNTER — Encounter: Payer: Self-pay | Admitting: Internal Medicine

## 2022-06-17 ENCOUNTER — Ambulatory Visit (INDEPENDENT_AMBULATORY_CARE_PROVIDER_SITE_OTHER): Payer: Medicare HMO | Admitting: Internal Medicine

## 2022-06-17 VITALS — BP 110/80 | HR 72 | Temp 98.7°F | Ht 62.0 in | Wt 121.0 lb

## 2022-06-17 DIAGNOSIS — W540XXA Bitten by dog, initial encounter: Secondary | ICD-10-CM

## 2022-06-17 DIAGNOSIS — S61259A Open bite of unspecified finger without damage to nail, initial encounter: Secondary | ICD-10-CM

## 2022-06-17 DIAGNOSIS — Z23 Encounter for immunization: Secondary | ICD-10-CM | POA: Diagnosis not present

## 2022-06-17 MED ORDER — MUPIROCIN 2 % EX OINT: 1.0000 | TOPICAL_OINTMENT | Freq: Two times a day (BID) | CUTANEOUS | 0 refills | Status: DC

## 2022-06-17 MED ORDER — SULFAMETHOXAZOLE-TRIMETHOPRIM 800-160 MG PO TABS
1.0000 | ORAL_TABLET | Freq: Two times a day (BID) | ORAL | 0 refills | Status: DC
Start: 2022-06-17 — End: 2022-08-03

## 2022-06-17 MED ORDER — METRONIDAZOLE 500 MG PO TABS: 500.0000 mg | ORAL_TABLET | Freq: Three times a day (TID) | ORAL | 0 refills | Status: AC

## 2022-06-17 NOTE — Telephone Encounter (Signed)
Megan Williams [phone]  Maurice called to say she was bitten by a dog the are fostering last night on one of her fingers. The dog is a boston terrier and is due for it next rabies shot in May. I scheduled her to come in at 2:30 this afternoon

## 2022-06-17 NOTE — Progress Notes (Addendum)
Patient Care Team: Margaree Mackintosh, MD as PCP - General (Internal Medicine) Christell Constant, MD as PCP - Cardiology (Cardiology)  Visit Date: 06/17/22  Subjective:    Patient ID: Megan Williams , Female   DOB: [birth year], 77 y.o.    MRN: [id number]   77 y.o. Female presents today for a dog bite that she received last night on her right second finger when she tried to separate fighting [employer]. One dog belonged to the patient. The other dog was a rescue animal whose Rabies vaccination status is in question. She has cleaned the wound with soap/water, peroxide. Has not used antibiotic ointment. She is in the process of confirming the Rabies vaccination status for the dog in question.This dog apparently was rescued from a home in the [place], [place] area and is now being kept for observation at the patient's vet office. Patient has been in contact with rescue organization and explained the situation.Patient has been advised to contact Animal Control and report this incident.  Last tetanus booster 07/18/18. Was given an update here in the office today.  Past Medical History:  Diagnosis Date   Allergy    Anxiety    Arthritis    Atrial fibrillation    Complication of anesthesia    "difficulty waking up. I could hear them but I couldn't wake up"   COPD (chronic obstructive pulmonary disease)    Depression    Emphysema    Esophagitis    Herpes simplex    Hypertension    Hypothyroidism    Migraines    Osteopenia    Vitamin D deficiency      Family History  Adopted: Yes  Family history unknown: Yes    Social History   Social History Narrative   Lives at home with husband.   Right-handed.   1 cup caffeine daily.      Review of Systems  Constitutional:  Negative for fever and malaise/fatigue.  HENT:  Negative for congestion.   Eyes:  Negative for blurred vision.  Respiratory:  Negative for cough and shortness of breath.   Cardiovascular:  Negative  for chest pain, palpitations and leg swelling.  Gastrointestinal:  Negative for vomiting.  Musculoskeletal:  Negative for back pain.  Skin:  Negative for rash.       (+) Laceration of right second finger  Neurological:  Negative for loss of consciousness and headaches.        Objective:   Vitals: BP 110/80   Pulse 72   Temp 98.7 F (37.1 C) (Tympanic)   Ht  (1.575 m)   Wt 121 lb (54.9 kg)   SpO2 96%   BMI 22.13 kg/m    Physical Exam Vitals and nursing note reviewed.  Constitutional:      General: She is not in acute distress.    Appearance: Normal appearance. She is not toxic-appearing.  HENT:     Head: Normocephalic and atraumatic.  Pulmonary:     Effort: Pulmonary effort is normal.  Musculoskeletal:     Comments: 1.5 cm long laceration to right second finger. Decreased ROM in right second finger PIP and DIP joints.   Skin:    General: Skin is warm and dry.  Neurological:     Mental Status: She is alert and oriented to person, place, and time. Mental status is at baseline.  Psychiatric:        Mood and Affect: Mood normal.  Behavior: Behavior normal.        Thought Content: Thought content normal.        Judgment: Judgment normal.       Results:   Studies obtained and personally reviewed by me:   Labs:       Component Value Date/Time   NA 144 10/19/2021 0945   K 5.0 10/19/2021 0945   CL 107 10/19/2021 0945   CO2 27 10/19/2021 0945   GLUCOSE 99 10/19/2021 0945   BUN 19 12/10/2021 1138   CREATININE 1.13 (H) 04/23/2022 0923   CALCIUM 10.2 10/19/2021 0945   PROT 6.5 04/23/2022 0923   ALBUMIN 4.0 10/07/2016 0925   AST 15 04/23/2022 0923   ALT 7 04/23/2022 0923   ALKPHOS 49 10/07/2016 0925   BILITOT 0.4 04/23/2022 0923   GFRNONAA >60 10/30/2020 0758   GFRNONAA 52 (L) 05/20/2020 1619   GFRAA 60 05/20/2020 1619     Lab Results  Component Value Date   WBC 6.1 10/19/2021   HGB 14.7 10/19/2021   HCT 43.4 10/19/2021   MCV 96.9  10/19/2021   PLT 287 10/19/2021    Lab Results  Component Value Date   CHOL 169 04/23/2022   HDL 57 04/23/2022   LDLCALC 88 04/23/2022   TRIG 138 04/23/2022   CHOLHDL 3.0 04/23/2022    Lab Results  Component Value Date   HGBA1C 4.9 10/07/2016     Lab Results  Component Value Date   TSH 1.84 06/10/2022      Assessment & Plan:   Dog bite right second finger: wound was soaked in Hebiclens and warm water for 20 minutes, Bactroban 2% ointment applied, wrapped with Telfa dressings and gauze. At home: soak in warm soapy water, apply Bactroban 2%, and wrap twice daily. Prescribed metronidazole 500 mg three times daily for 7 days, Bactrim 800-160 mg twice daily for 10 days. Updated tetanus. She is checking about the dog's rabies vaccine status-- if unable to determine by tomorrow she will go to the ED for rabies vaccine and immunoglobulin.            I,Alexander Ruley,acting as a Neurosurgeon for Margaree Mackintosh, MD.,have documented all relevant documentation on the behalf of Margaree Mackintosh, MD,as directed by  Margaree Mackintosh, MD while in the presence of Margaree Mackintosh, MD.   I, Margaree Mackintosh, MD, have reviewed all documentation for this visit. The documentation on 06/17/22 for the exam, diagnosis, procedures, and orders are all accurate and complete.

## 2022-06-17 NOTE — Patient Instructions (Addendum)
Dog bite right second finger soaked in Hibiclens and warm water for 20 minutes.  Bactroban ointment applied and finger dressed in sterile fashion.  She has been placed on metronidazole 500 mg 3 times a day for 7 days and Bactrim DS twice daily for 10 days.  Tetanus vaccine updated.  She is checking about the dog's rabies status.  It is currently housed at her veterinarian's office.  We have advised her to go to the Emergency department for rabies vaccine and immunoglobulin if unable to determine status by tomorrow morning.  She will need follow-up with dog bite here in the office in 5 days or sooner if worse.  Bite will need to be soaked in warm soapy water, pat dried and dressed in sterile fashion using Bactroban ointment twice daily.

## 2022-06-21 ENCOUNTER — Telehealth: Payer: Self-pay | Admitting: Internal Medicine

## 2022-06-21 NOTE — Telephone Encounter (Signed)
Patient called and said she is ready to get her Prolia shot and said she needs it sent into [facility] pharmacy Phone #[phone]. Her call back is [phone]

## 2022-06-24 ENCOUNTER — Other Ambulatory Visit: Payer: Self-pay

## 2022-06-24 MED ORDER — DENOSUMAB 60 MG/ML ~~LOC~~ SOSY: 60.0000 mg | PREFILLED_SYRINGE | Freq: Once | SUBCUTANEOUS | 0 refills | Status: AC

## 2022-06-24 NOTE — Telephone Encounter (Signed)
Rx sent to pharmacy   

## 2022-06-29 NOTE — Telephone Encounter (Signed)
Patient has decided to get Prolia from her pharmacy that s the most cost effective way. Prolia is coming from pharmacy on May 2

## 2022-07-02 NOTE — Telephone Encounter (Signed)
scheduled

## 2022-07-06 DIAGNOSIS — M5414 Radiculopathy, thoracic region: Secondary | ICD-10-CM | POA: Diagnosis not present

## 2022-07-06 DIAGNOSIS — M5116 Intervertebral disc disorders with radiculopathy, lumbar region: Secondary | ICD-10-CM | POA: Diagnosis not present

## 2022-07-08 NOTE — Progress Notes (Signed)
Patient Care Team: Margaree Mackintosh, MD as PCP - General (Internal Medicine) Christell Constant, MD as PCP - Cardiology (Cardiology)  Visit Date: 07/15/22  Subjective:    Patient ID: Megan Williams , Female   DOB: Jul 05, 1945, 77 y.o.    MRN: [id number]   78 y.o. Female presents today for a Prolia injection. History of osteopenia treated with Prolia 60 mg injections every 6 months. Had bone density study with -1.7 T score in March 2023. Followed by Dr. Cleophas Dunker, orthopedist.  Her right second finger laceration is still painful. Does not have full mobility.  Past Medical History:  Diagnosis Date   Allergy    Anxiety    Arthritis    Atrial fibrillation (HCC)    Complication of anesthesia    "difficulty waking up. I could hear them but I couldn't wake up"   COPD (chronic obstructive pulmonary disease) (HCC)    Depression    Emphysema    Esophagitis    Herpes simplex    Hypertension    Hypothyroidism    Migraines    Osteopenia    Vitamin D deficiency      Family History  Adopted: Yes  Family history unknown: Yes    Social History   Social History Narrative   Lives at home with husband.   Right-handed.   1 cup caffeine daily.      Review of Systems  Constitutional:  Negative for fever and malaise/fatigue.  HENT:  Negative for congestion.   Eyes:  Negative for blurred vision.  Respiratory:  Negative for cough and shortness of breath.   Cardiovascular:  Negative for chest pain, palpitations and leg swelling.  Gastrointestinal:  Negative for vomiting.  Musculoskeletal:  Negative for back pain.  Skin:  Negative for rash.       (+) Pain in right second finger at dog bite site  Neurological:  Negative for loss of consciousness and headaches.        Objective:   Vitals: BP 122/74   Pulse 71   Temp 98.1 F (36.7 C) (Tympanic)   Ht 5\' 2"  (1.575 m)   Wt 118 lb 12.8 oz (53.9 kg)   SpO2 96%   BMI 21.73 kg/m    Physical Exam Vitals and nursing note  reviewed.  Constitutional:      General: She is not in acute distress.    Appearance: Normal appearance. She is not toxic-appearing.  HENT:     Head: Normocephalic and atraumatic.  Pulmonary:     Effort: Pulmonary effort is normal.  Skin:    General: Skin is warm and dry.  Neurological:     Mental Status: She is alert and oriented to person, place, and time. Mental status is at baseline.  Psychiatric:        Mood and Affect: Mood normal.        Behavior: Behavior normal.        Thought Content: Thought content normal.        Judgment: Judgment normal.       Results:   Studies obtained and personally reviewed by me:   Labs:       Component Value Date/Time   NA 144 10/19/2021 0945   K 5.0 10/19/2021 0945   CL 107 10/19/2021 0945   CO2 27 10/19/2021 0945   GLUCOSE 99 10/19/2021 0945   BUN 19 12/10/2021 1138   CREATININE 1.13 (H) 04/23/2022 0923   CALCIUM 10.2 10/19/2021 0945   PROT  6.5 04/23/2022 0923   ALBUMIN 4.0 10/07/2016 0925   AST 15 04/23/2022 0923   ALT 7 04/23/2022 0923   ALKPHOS 49 10/07/2016 0925   BILITOT 0.4 04/23/2022 0923   GFRNONAA >60 10/30/2020 0758   GFRNONAA 52 (L) 05/20/2020 1619   GFRAA 60 05/20/2020 1619     Lab Results  Component Value Date   WBC 6.1 10/19/2021   HGB 14.7 10/19/2021   HCT 43.4 10/19/2021   MCV 96.9 10/19/2021   PLT 287 10/19/2021    Lab Results  Component Value Date   CHOL 169 04/23/2022   HDL 57 04/23/2022   LDLCALC 88 04/23/2022   TRIG 138 04/23/2022   CHOLHDL 3.0 04/23/2022    Lab Results  Component Value Date   HGBA1C 4.9 10/07/2016     Lab Results  Component Value Date   TSH 1.84 06/10/2022      Assessment & Plan:   Osteopenia: Prolia 60 mg administered IM. Next Prolia injection will be in 6 months. Next bone density to be in 04/2023.    I,Alexander Ruley,acting as a Neurosurgeon for Margaree Mackintosh, MD.,have documented all relevant documentation on the behalf of Margaree Mackintosh, MD,as directed by  Margaree Mackintosh, MD while in the presence of Margaree Mackintosh, MD.   I, Margaree Mackintosh, MD, have reviewed all documentation for this visit. The documentation on 07/17/22 for the exam, diagnosis, procedures, and orders are all accurate and complete.

## 2022-07-15 ENCOUNTER — Encounter: Payer: Self-pay | Admitting: Internal Medicine

## 2022-07-15 ENCOUNTER — Ambulatory Visit (INDEPENDENT_AMBULATORY_CARE_PROVIDER_SITE_OTHER): Payer: Medicare HMO | Admitting: Internal Medicine

## 2022-07-15 VITALS — BP 122/74 | HR 71 | Temp 98.1°F | Ht 62.0 in | Wt 118.8 lb

## 2022-07-15 DIAGNOSIS — M810 Age-related osteoporosis without current pathological fracture: Secondary | ICD-10-CM

## 2022-07-15 DIAGNOSIS — W540XXD Bitten by dog, subsequent encounter: Secondary | ICD-10-CM | POA: Diagnosis not present

## 2022-07-15 DIAGNOSIS — M8580 Other specified disorders of bone density and structure, unspecified site: Secondary | ICD-10-CM

## 2022-07-15 DIAGNOSIS — S61258D Open bite of other finger without damage to nail, subsequent encounter: Secondary | ICD-10-CM | POA: Diagnosis not present

## 2022-07-15 MED ORDER — DENOSUMAB 60 MG/ML ~~LOC~~ SOSY
60.0000 mg | PREFILLED_SYRINGE | Freq: Once | SUBCUTANEOUS | Status: AC
Start: 2022-07-15 — End: 2022-07-15
  Administered 2022-07-15: 60 mg via SUBCUTANEOUS

## 2022-07-15 NOTE — Patient Instructions (Signed)
Repeat Prolia in 6 months. Bone density study due 2025.

## 2022-07-19 ENCOUNTER — Telehealth: Payer: Self-pay | Admitting: Internal Medicine

## 2022-07-19 ENCOUNTER — Other Ambulatory Visit: Payer: Self-pay | Admitting: Internal Medicine

## 2022-07-19 DIAGNOSIS — I700 Atherosclerosis of aorta: Secondary | ICD-10-CM

## 2022-07-19 DIAGNOSIS — E785 Hyperlipidemia, unspecified: Secondary | ICD-10-CM

## 2022-07-19 NOTE — Telephone Encounter (Signed)
Called pt in regards to lab draw.  Advised pt that we result labs through [facility].  Pt reports can't get in contact with a live agent with [facility] to set up an appointment.   Advised pt can come into our office to have FLP drawn.  Pt reports will have labs drawn at our office on 07/28/22.  No further questions or concerns at this time.   Order for FLP placed.    MD OV note from 02/12/22: HLD Aortic atherosclerosis - we discussed dietary changers and lifestyle changes at length - if still elevated will get rosvastatin 5 mg (06/29/21 fasting lipids)

## 2022-07-19 NOTE — Telephone Encounter (Signed)
New Message:      Patient wants to know if she can go to have lab work at [facility]? She has an order for [facility], but wants to go to [facility]..

## 2022-07-20 ENCOUNTER — Encounter: Payer: Self-pay | Admitting: Internal Medicine

## 2022-07-20 DIAGNOSIS — N3281 Overactive bladder: Secondary | ICD-10-CM | POA: Diagnosis not present

## 2022-07-20 DIAGNOSIS — N319 Neuromuscular dysfunction of bladder, unspecified: Secondary | ICD-10-CM | POA: Diagnosis not present

## 2022-07-28 ENCOUNTER — Ambulatory Visit: Payer: Medicare HMO | Attending: Internal Medicine

## 2022-07-28 DIAGNOSIS — E785 Hyperlipidemia, unspecified: Secondary | ICD-10-CM | POA: Diagnosis not present

## 2022-07-29 ENCOUNTER — Telehealth: Payer: Self-pay

## 2022-07-29 DIAGNOSIS — I700 Atherosclerosis of aorta: Secondary | ICD-10-CM

## 2022-07-29 DIAGNOSIS — E785 Hyperlipidemia, unspecified: Secondary | ICD-10-CM

## 2022-07-29 LAB — LIPID PANEL
Chol/HDL Ratio: 3 ratio (ref 0.0–4.4)
Cholesterol, Total: 195 mg/dL (ref 100–199)
HDL: 64 mg/dL (ref >39)
LDL Chol Calc (NIH): 113 mg/dL — ABNORMAL HIGH (ref 0–99)
Triglycerides: 100 mg/dL (ref 0–149)
VLDL Cholesterol Cal: 18 mg/dL (ref 5–40)

## 2022-07-29 MED ORDER — ROSUVASTATIN CALCIUM 5 MG PO TABS: 5.0000 mg | ORAL_TABLET | Freq: Every day | ORAL | 3 refills | Status: DC

## 2022-07-29 NOTE — Telephone Encounter (Signed)
-----   Message from Christell Constant, MD sent at 07/29/2022 11:07 AM EDT ----- Results: LDL above goal despite the  dietary chang and lifestyle changes we discussed in 01/2022 Plan: Rosuvastatin 5 mg and labs in 3 months  Christell Constant, MD

## 2022-07-29 NOTE — Telephone Encounter (Signed)
The patient has been notified of the result and verbalized understanding.  All questions (if any) were answered. Megan Right Orazio Weller, RN 07/29/2022 4:41 PM   Pt will in for FLP, ALT on 11/16/22.

## 2022-08-03 ENCOUNTER — Ambulatory Visit (INDEPENDENT_AMBULATORY_CARE_PROVIDER_SITE_OTHER): Payer: Medicare HMO | Admitting: Nurse Practitioner

## 2022-08-03 ENCOUNTER — Encounter: Payer: Self-pay | Admitting: Nurse Practitioner

## 2022-08-03 VITALS — BP 116/70 | HR 62 | Ht 62.5 in | Wt 121.0 lb

## 2022-08-03 DIAGNOSIS — Z79890 Hormone replacement therapy (postmenopausal): Secondary | ICD-10-CM

## 2022-08-03 MED ORDER — ESTRADIOL 0.05 MG/24HR TD PTTW
1.0000 | MEDICATED_PATCH | TRANSDERMAL | 3 refills | Status: DC
Start: 2022-08-05 — End: 2022-08-16

## 2022-08-03 NOTE — Progress Notes (Signed)
   Acute Office Visit  Subjective:    Patient ID: Megan Williams, female    DOB: 08-24-1945, 77 y.o.   MRN: [id number]   HPI 77 y.o. presents today for medication management. On ERT for management of menopausal symptoms. Has tried to wean but does not tolerate hot flashes. Still has occasional hot flashes on current dose. Night sweats have been worse lately. Recent adjustments made to levothyroxine. S/P TVH. Smokes 5 cigarettes per day. H/O HLD, just started on rosuvastatin. UTD on mammogram.   No LMP recorded. Patient has had a hysterectomy.    Review of Systems  Constitutional: Negative.   Endocrine: Positive for heat intolerance.       Objective:    Physical Exam Constitutional:      Appearance: Normal appearance.     BP 116/70   Pulse 62   Ht 5' 2.5" (1.588 m)   Wt 121 lb (54.9 kg)   SpO2 100%   BMI 21.78 kg/m  Wt Readings from Last 3 Encounters:  08/03/22 121 lb (54.9 kg)  07/15/22 118 lb 12.8 oz (53.9 kg)  06/17/22 121 lb (54.9 kg)         Assessment & Plan:   Problem List Items Addressed This Visit   None Visit Diagnoses     Postmenopausal hormone therapy    -  Primary   Relevant Medications   estradiol (VIVELLE-DOT) 0.05 MG/24HR patch (Start on 08/05/2022)      Plan: Discussed oral estradiol and risk of elevated cholesterol levels. Recommend switching to patch and he is agreeable. Educated on proper use. Smoking cessation recommended.      Olivia Mackie DNP, 10:58 AM 08/03/2022

## 2022-08-12 ENCOUNTER — Encounter: Payer: Self-pay | Admitting: Nurse Practitioner

## 2022-08-13 NOTE — Telephone Encounter (Signed)
Please refer to other telephone encounter from same date.

## 2022-08-16 ENCOUNTER — Other Ambulatory Visit: Payer: Self-pay | Admitting: Nurse Practitioner

## 2022-08-16 DIAGNOSIS — Z79890 Hormone replacement therapy (postmenopausal): Secondary | ICD-10-CM

## 2022-08-16 MED ORDER — ESTRADIOL 1 MG PO TABS
1.0000 mg | ORAL_TABLET | Freq: Every day | ORAL | 3 refills | Status: DC
Start: 2022-08-16 — End: 2023-08-18

## 2022-09-10 DIAGNOSIS — M47816 Spondylosis without myelopathy or radiculopathy, lumbar region: Secondary | ICD-10-CM | POA: Diagnosis not present

## 2022-09-10 DIAGNOSIS — M546 Pain in thoracic spine: Secondary | ICD-10-CM | POA: Diagnosis not present

## 2022-09-17 IMAGING — US US RENAL
1 series · 14 of 25 positions shown · non-contrast
Comparison: 12/29/2011

CLINICAL DATA: Chronic renal disease

EXAM:
RENAL / URINARY TRACT ULTRASOUND COMPLETE

[Series 1: us renal · 0.19mm/px · 14 of 39 slices shown]
[im 1/39]
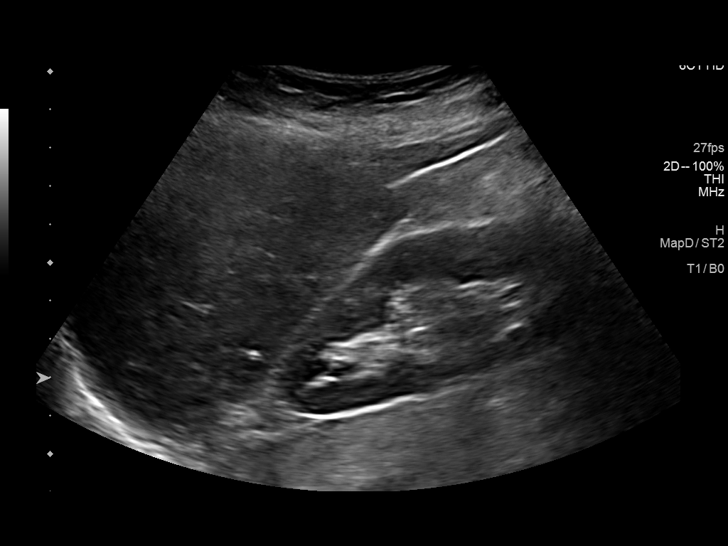
[im 4/39]
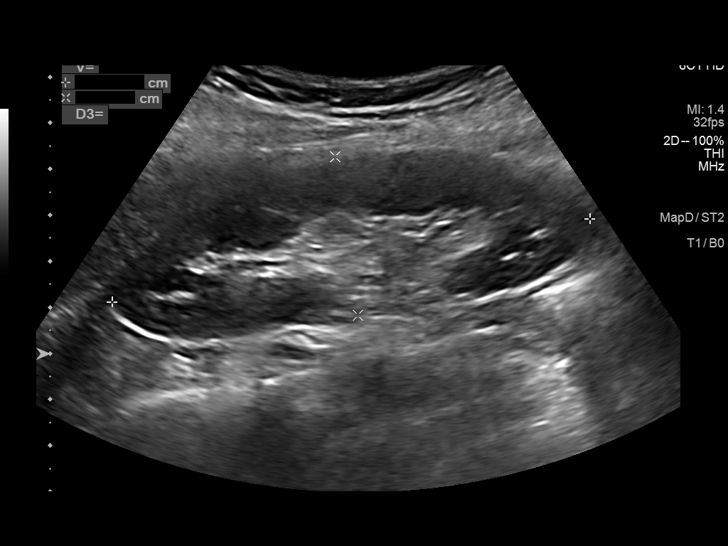
[im 7/39]
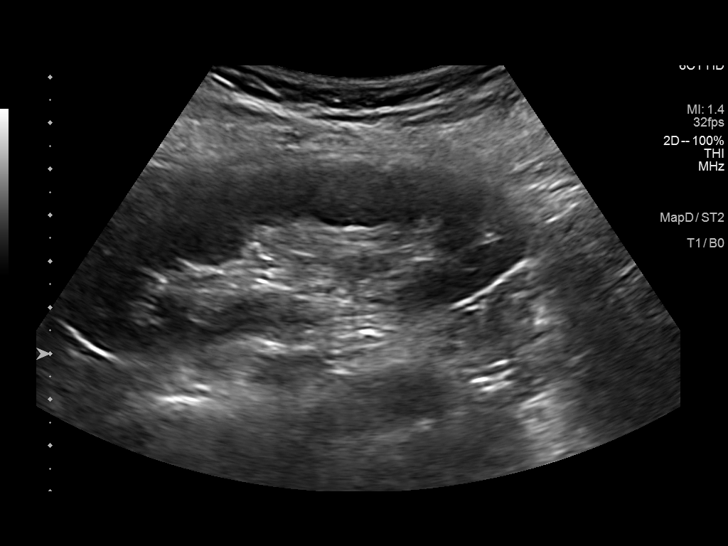
[im 10/39]
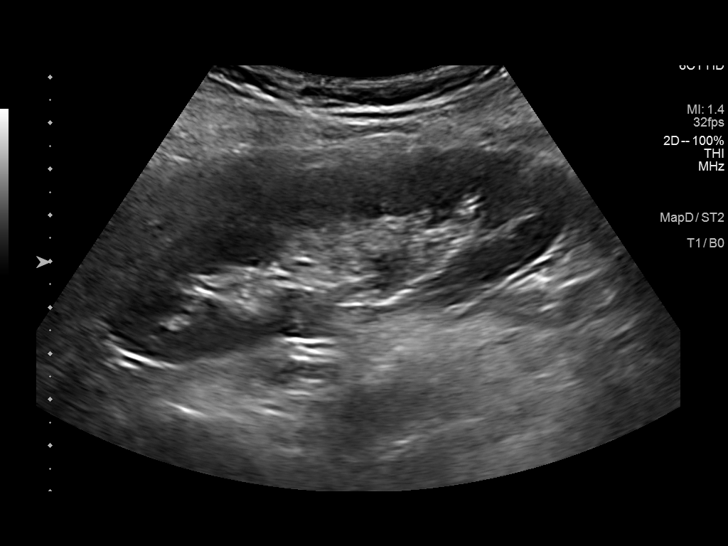
[im 13/39]
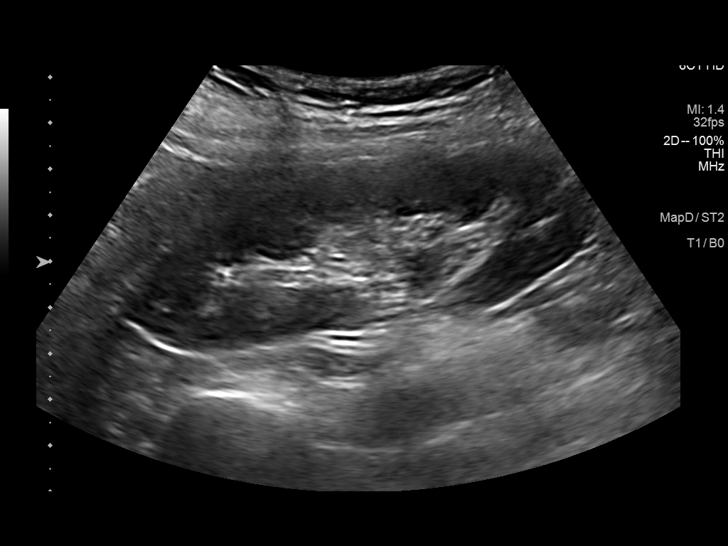
[im 15/39]
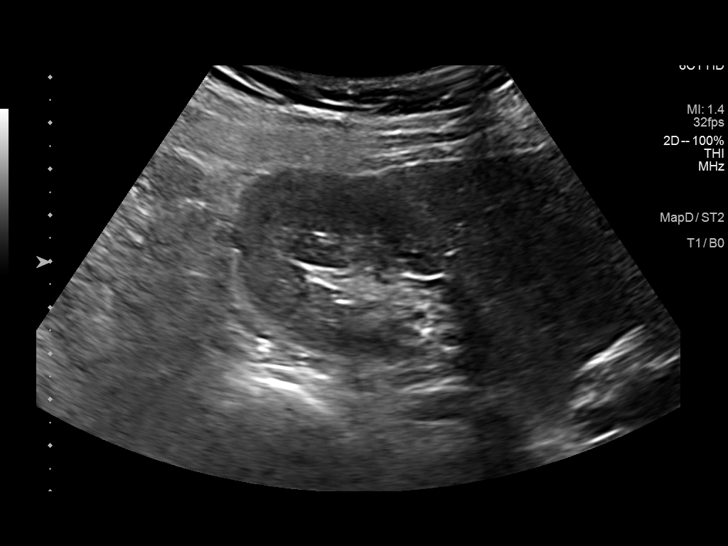
[im 18/39]
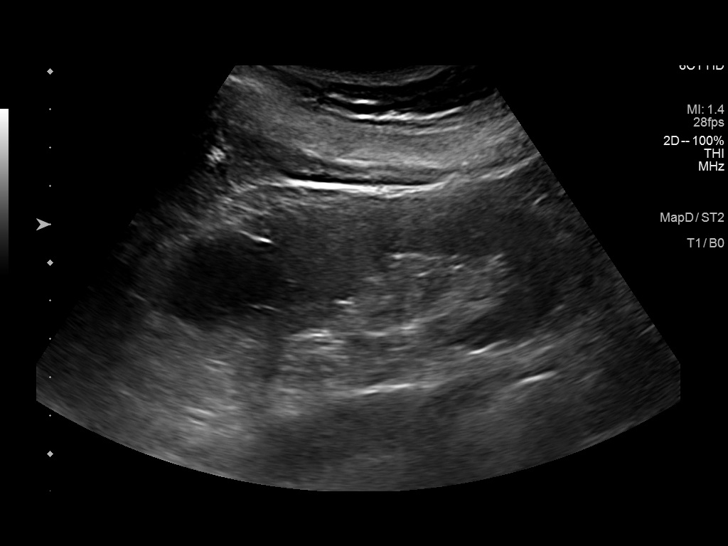
[im 21/39]
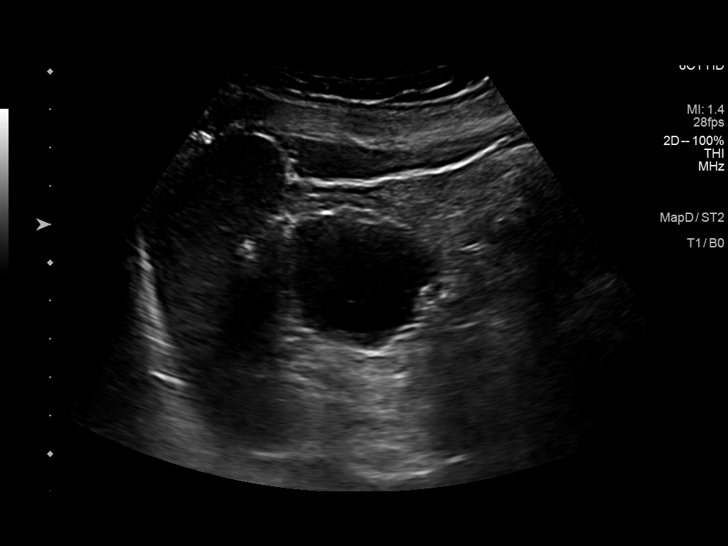
[im 24/39]
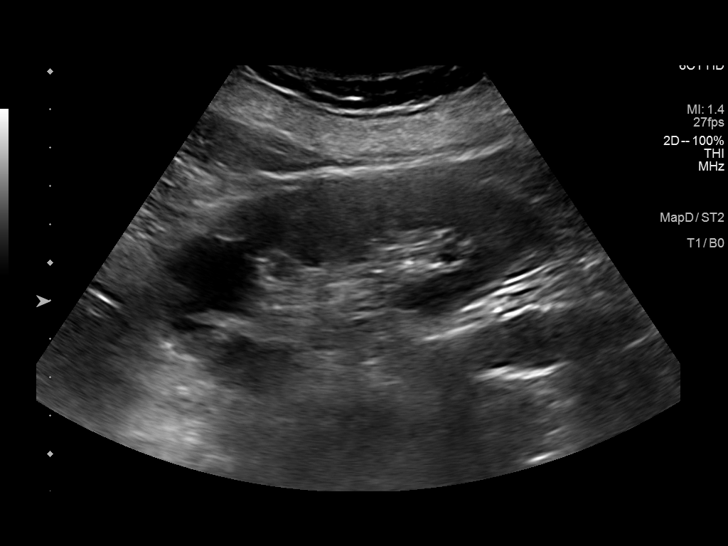
[im 26/39]
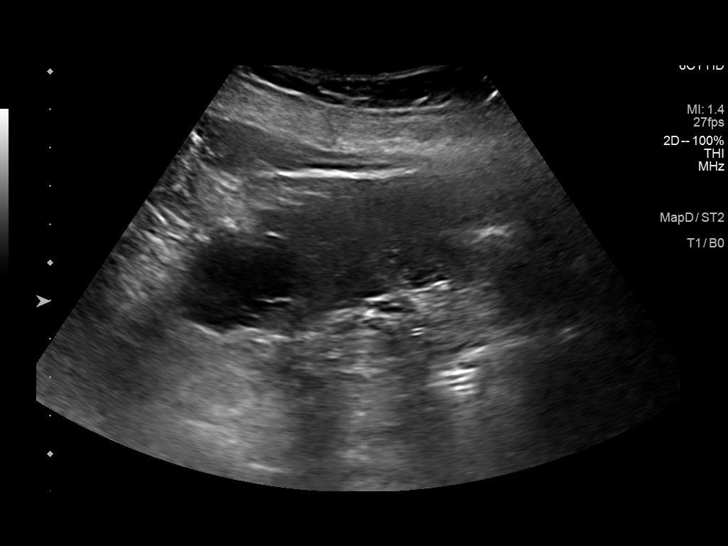
[im 29/39]
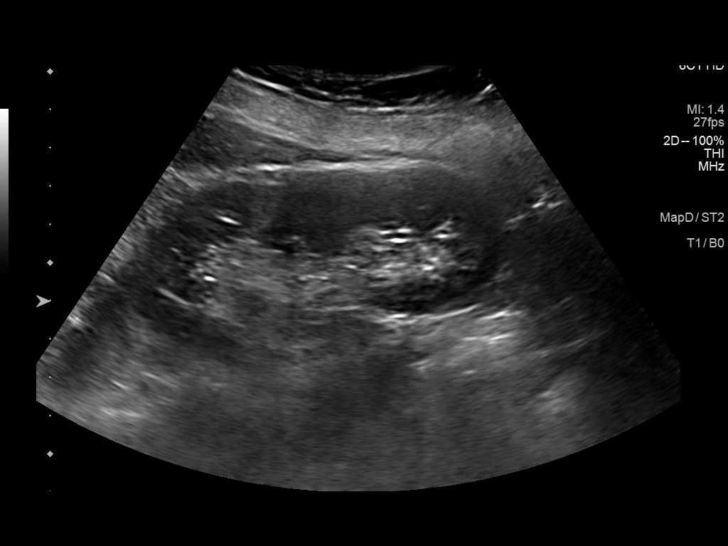
[im 32/39]
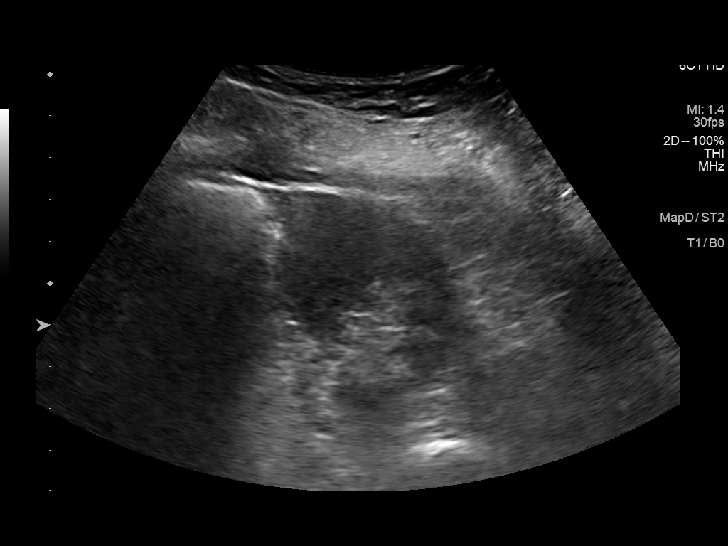
[im 35/39]
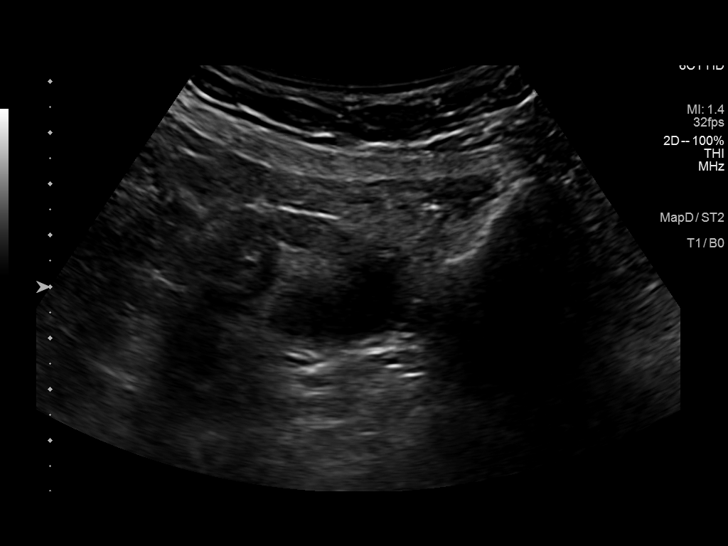
[im 39/39]
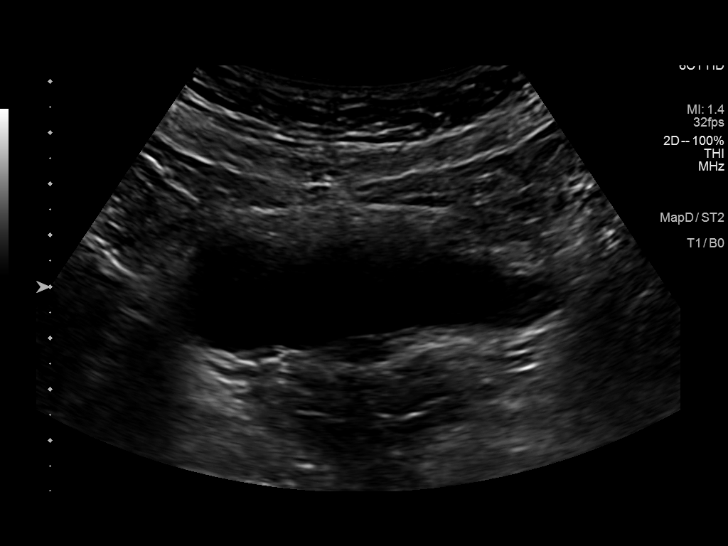

[14 of 25 positions shown; findings below may reference images not displayed]

FINDINGS: Right Kidney:

Renal measurements: 10.5 x 3.5 x 4.6 cm. = volume: 88 mL.
Echogenicity within normal limits. No mass or hydronephrosis
visualized.

Left Kidney:

Renal measurements: 11.4 x 4.2 x 4.7 cm. = volume: 120 mL. 3.7 cm
upper pole cyst is noted simple in nature. No mass lesion or
hydronephrosis is noted.

Bladder:

Partially distended.

Other:

None.
IMPRESSION: Left renal cyst.

No other focal abnormality is noted.

## 2022-09-22 DIAGNOSIS — M4804 Spinal stenosis, thoracic region: Secondary | ICD-10-CM | POA: Diagnosis not present

## 2022-09-22 DIAGNOSIS — M546 Pain in thoracic spine: Secondary | ICD-10-CM | POA: Diagnosis not present

## 2022-09-22 DIAGNOSIS — M5136 Other intervertebral disc degeneration, lumbar region: Secondary | ICD-10-CM | POA: Diagnosis not present

## 2022-09-22 DIAGNOSIS — M47816 Spondylosis without myelopathy or radiculopathy, lumbar region: Secondary | ICD-10-CM | POA: Diagnosis not present

## 2022-09-22 DIAGNOSIS — M48061 Spinal stenosis, lumbar region without neurogenic claudication: Secondary | ICD-10-CM | POA: Diagnosis not present

## 2022-09-22 DIAGNOSIS — M5126 Other intervertebral disc displacement, lumbar region: Secondary | ICD-10-CM | POA: Diagnosis not present

## 2022-09-22 DIAGNOSIS — M47814 Spondylosis without myelopathy or radiculopathy, thoracic region: Secondary | ICD-10-CM | POA: Diagnosis not present

## 2022-09-22 DIAGNOSIS — M4854XA Collapsed vertebra, not elsewhere classified, thoracic region, initial encounter for fracture: Secondary | ICD-10-CM | POA: Diagnosis not present

## 2022-09-22 DIAGNOSIS — M4808 Spinal stenosis, sacral and sacrococcygeal region: Secondary | ICD-10-CM | POA: Diagnosis not present

## 2022-09-29 ENCOUNTER — Other Ambulatory Visit: Payer: Self-pay | Admitting: Internal Medicine

## 2022-09-29 DIAGNOSIS — Z1231 Encounter for screening mammogram for malignant neoplasm of breast: Secondary | ICD-10-CM

## 2022-10-06 DIAGNOSIS — M48062 Spinal stenosis, lumbar region with neurogenic claudication: Secondary | ICD-10-CM | POA: Diagnosis not present

## 2022-10-06 MED ORDER — DENOSUMAB 60 MG/ML ~~LOC~~ SOSY
60.0000 mg | PREFILLED_SYRINGE | Freq: Once | SUBCUTANEOUS | Status: AC
Start: 2022-10-06 — End: 2022-07-15
  Administered 2022-07-15: 60 mg via SUBCUTANEOUS

## 2022-10-06 NOTE — Addendum Note (Signed)
Addended by: Alysia Penna on: 10/06/2022 09:33 AM   Modules accepted: Orders

## 2022-10-18 ENCOUNTER — Encounter: Payer: Self-pay | Admitting: Internal Medicine

## 2022-10-18 ENCOUNTER — Ambulatory Visit (INDEPENDENT_AMBULATORY_CARE_PROVIDER_SITE_OTHER): Payer: Medicare HMO | Admitting: Internal Medicine

## 2022-10-18 ENCOUNTER — Telehealth: Payer: Self-pay | Admitting: Internal Medicine

## 2022-10-18 VITALS — BP 102/80 | HR 65 | Ht 62.5 in | Wt 130.0 lb

## 2022-10-18 DIAGNOSIS — Z9181 History of falling: Secondary | ICD-10-CM | POA: Diagnosis not present

## 2022-10-18 DIAGNOSIS — S81811A Laceration without foreign body, right lower leg, initial encounter: Secondary | ICD-10-CM

## 2022-10-18 DIAGNOSIS — W19XXXA Unspecified fall, initial encounter: Secondary | ICD-10-CM

## 2022-10-18 DIAGNOSIS — S81812A Laceration without foreign body, left lower leg, initial encounter: Secondary | ICD-10-CM | POA: Diagnosis not present

## 2022-10-18 DIAGNOSIS — S8012XA Contusion of left lower leg, initial encounter: Secondary | ICD-10-CM | POA: Diagnosis not present

## 2022-10-18 DIAGNOSIS — S8011XA Contusion of right lower leg, initial encounter: Secondary | ICD-10-CM

## 2022-10-18 MED ORDER — MUPIROCIN 2 % EX OINT: 1.0000 | TOPICAL_OINTMENT | Freq: Two times a day (BID) | CUTANEOUS | 0 refills | Status: DC

## 2022-10-18 NOTE — Patient Instructions (Addendum)
We are sorry you have suffered an accident due to a fall.  Your tetanus immunization is up-to-date.  We have dressed your wounds with Telfa after cleaning with peroxide and applying mupirocin ointment.  We have prescribed mupirocin ointment for you to pick up at drugstore and use topically twice daily.  Please clean the wounds with peroxide dry them carefully with sterile gauze and apply mupirocin ointment.  Keep them wrapped until healed.  The contusions will likely take several weeks to heal.

## 2022-10-18 NOTE — Progress Notes (Signed)
Patient Care Team: Margaree Mackintosh, MD as PCP - General (Internal Medicine) Christell Constant, MD as PCP - Cardiology (Cardiology) Olivia Mackie, NP as Nurse Practitioner (Gynecology)  Visit Date: 10/18/22  Subjective:    Patient ID: Megan Williams , Female   DOB: 12-17-1945, 77 y.o.    MRN: [id number]   77 y.o. Female presents today for fall injuries. She fell yesterday in her yard near some bricks. She did not strike her head. She sustained superficial injuries and bruises to her legs. She also has pain below her left breast. Had another fall earlier in 8/24 where she fell on the porch steps at her house. Last tetanus was 06/17/22.  She is taking ibuprofen 600 mg twice daily and Lyrica 75 mg twice daily for back pain and this has been inadequate. She has spoken with neurosurgeon, Dr. Danielle Dess,  and is considering further spinal surgery.  Past Medical History:  Diagnosis Date   Allergy    Anxiety    Arthritis    Atrial fibrillation (HCC)    Complication of anesthesia    "difficulty waking up. I could hear them but I couldn't wake up"   COPD (chronic obstructive pulmonary disease) (HCC)    Depression    Emphysema    Esophagitis    Herpes simplex    Hypertension    Hypothyroidism    Migraines    Osteopenia    Vitamin D deficiency      Family History  Adopted: Yes  Family history unknown: Yes    Social History   Social History Narrative   Lives at home with husband.   Right-handed.   1 cup caffeine daily.      Review of Systems  Constitutional:  Negative for fever and malaise/fatigue.  HENT:  Negative for congestion.   Eyes:  Negative for blurred vision.  Respiratory:  Negative for cough and shortness of breath.   Cardiovascular:  Negative for chest pain, palpitations and leg swelling.  Gastrointestinal:  Negative for vomiting.  Musculoskeletal:  Negative for back pain.  Skin:  Negative for rash.       (+) Bruising, abrasions legs (+) Avulsion left  knee  Neurological:  Negative for loss of consciousness and headaches.        Objective:   Vitals: BP 102/80   Pulse 65   Ht 5' 2.5" (1.588 m)   Wt 130 lb (59 kg)   SpO2 95%   BMI 23.40 kg/m    Physical Exam Vitals and nursing note reviewed.  Constitutional:      General: She is not in acute distress.    Appearance: Normal appearance. She is not toxic-appearing.  HENT:     Head: Normocephalic and atraumatic.  Cardiovascular:     Comments: Superficial varicosities bilateral lower legs. No clots detected in lower legs. Pulmonary:     Effort: Pulmonary effort is normal.  Musculoskeletal:     Comments: 5/5 muscle strength in all groups tested.  Skin:    General: Skin is warm and dry.     Comments: Contusion left knee. 1.5 x 3 cm avulsion of skin left mid-patellar area. Superficial laceration anterior lateral right ankle area. Scattered contusions bilateral legs.  Neurological:     Mental Status: She is alert and oriented to person, place, and time. Mental status is at baseline.     Cranial Nerves: Cranial nerves 2-12 are intact.     Sensory: Sensation is intact.     Motor:  Motor function is intact.     Coordination: Coordination is intact.  Psychiatric:        Mood and Affect: Mood normal.        Behavior: Behavior normal.        Thought Content: Thought content normal.        Judgment: Judgment normal.       Results:   Studies obtained and personally reviewed by me:   Labs:       Component Value Date/Time   NA 144 10/19/2021 0945   K 5.0 10/19/2021 0945   CL 107 10/19/2021 0945   CO2 27 10/19/2021 0945   GLUCOSE 99 10/19/2021 0945   BUN 19 12/10/2021 1138   CREATININE 1.13 (H) 04/23/2022 0923   CALCIUM 10.2 10/19/2021 0945   PROT 6.5 04/23/2022 0923   ALBUMIN 4.0 10/07/2016 0925   AST 15 04/23/2022 0923   ALT 7 04/23/2022 0923   ALKPHOS 49 10/07/2016 0925   BILITOT 0.4 04/23/2022 0923   GFRNONAA >60 10/30/2020 0758   GFRNONAA 52 (L) 05/20/2020  1619   GFRAA 60 05/20/2020 1619     Lab Results  Component Value Date   WBC 6.1 10/19/2021   HGB 14.7 10/19/2021   HCT 43.4 10/19/2021   MCV 96.9 10/19/2021   PLT 287 10/19/2021    Lab Results  Component Value Date   CHOL 195 07/28/2022   HDL 64 07/28/2022   LDLCALC 113 (H) 07/28/2022   TRIG 100 07/28/2022   CHOLHDL 3.0 07/28/2022    Lab Results  Component Value Date   HGBA1C 4.9 10/07/2016     Lab Results  Component Value Date   TSH 1.84 06/10/2022      Assessment & Plan:   Multiple contusions bilateral knees; multiple abrasions bilateral lower legs; avulsion left knee: cleaned with mupirocin and wrapped with Telfa in the office today. Instructed to clean and wrap wound twice daily. Prescribed mupirocin.  Dr. Danielle Dess, neurosurgeon, has indicated that she is going to need more spinal surgery in the near future. She can take extra strength Tylenol with ibuprofen if needed.  Return in 10 days for wound check.                     I,Alexander Ruley,acting as a Neurosurgeon for Margaree Mackintosh, MD.,have documented all relevant documentation on the behalf of Margaree Mackintosh, MD,as directed by  Margaree Mackintosh, MD while in the presence of Margaree Mackintosh, MD.   I, Margaree Mackintosh, MD, have reviewed all documentation for this visit. The documentation on 10/18/22 for the exam, diagnosis, procedures, and orders are all accurate and complete.

## 2022-10-18 NOTE — Telephone Encounter (Signed)
scheduled

## 2022-10-18 NOTE — Telephone Encounter (Signed)
Megan Williams [phone]  Rob fell on Saturday and she is hurting in her ribs under her breast and she also has scrapes on both legs, the one on her left knee has skin laid back and she also has skin laid back on right shin.

## 2022-10-20 ENCOUNTER — Other Ambulatory Visit: Payer: Self-pay | Admitting: Internal Medicine

## 2022-10-21 ENCOUNTER — Telehealth: Payer: Self-pay | Admitting: Internal Medicine

## 2022-10-21 ENCOUNTER — Other Ambulatory Visit: Payer: Medicare HMO

## 2022-10-21 DIAGNOSIS — Z0000 Encounter for general adult medical examination without abnormal findings: Secondary | ICD-10-CM | POA: Diagnosis not present

## 2022-10-21 DIAGNOSIS — I10 Essential (primary) hypertension: Secondary | ICD-10-CM

## 2022-10-21 DIAGNOSIS — R7989 Other specified abnormal findings of blood chemistry: Secondary | ICD-10-CM

## 2022-10-21 DIAGNOSIS — F32A Depression, unspecified: Secondary | ICD-10-CM | POA: Diagnosis not present

## 2022-10-21 DIAGNOSIS — F439 Reaction to severe stress, unspecified: Secondary | ICD-10-CM | POA: Diagnosis not present

## 2022-10-21 DIAGNOSIS — F419 Anxiety disorder, unspecified: Secondary | ICD-10-CM | POA: Diagnosis not present

## 2022-10-21 DIAGNOSIS — M810 Age-related osteoporosis without current pathological fracture: Secondary | ICD-10-CM

## 2022-10-21 DIAGNOSIS — E7800 Pure hypercholesterolemia, unspecified: Secondary | ICD-10-CM | POA: Diagnosis not present

## 2022-10-21 DIAGNOSIS — R718 Other abnormality of red blood cells: Secondary | ICD-10-CM | POA: Diagnosis not present

## 2022-10-21 NOTE — Progress Notes (Signed)
Annual Wellness Visit    Patient Care Team: Melquan Ernsberger, Luanna Cole, MD as PCP - General (Internal Medicine) Christell Constant, MD as PCP - Cardiology (Cardiology) Olivia Mackie, NP as Nurse Practitioner (Gynecology)  Visit Date: 10/28/22   Chief Complaint  Patient presents with   Medicare Wellness   Surgical clearance   Annual Exam    Subjective:   Patient: Megan Williams, Female    DOB: Jan 31, 1946, 77 y.o.   MRN: [id number]  MALLAK RAPIER is a 77 y.o. Female who presents today for her Annual Wellness Visit and pre-surgical evaluation for spinal surgery with Dr. Danielle Dess. History of allergies, anxiety and depression, arthritis, atrial fibrillation, COPD, emphysema, esophagitis, herpes simplex, hypertension, hypothyroidism, migraine headaches, osteopenia, Vitamin D deficiency.  Pending lumbar fusion with Dr. Danielle Dess. Denies chest pain, shortness of breath. She has chronic back pain and has had several back surgeries. History of T10 compression fracture in 2017 after a fall in the shower. In 2012 she had L4-L5 decompression stabilization by Dr. Danielle Dess.  She also had cervical spine 3 level fusion C4-C7 by Dr. Danielle Dess in 2005.  Vitamin B12 level at 363. She is taking Vitamin B12 1,000 mcg daily.  History of anxiety and depression treated with diazepam 2.5 - 5 mg daily as needed, venlafaxine XR 300 mg with breakfast.  History of GERD treated with pantoprazole 40 mg daily.  History of hyperlipidemia treated with rosuvastatin 5 mg daily, fenofibrate 160 mg daily. HDL low at 41, lipids otherwise normal.  History of hypothyroidism treated with levothyroxine 88 mcg daily. TSH at 2.96.  Had bone density study with -1.7 T score in March 2023 and is still on Prolia per Dr. Cleophas Dunker.   Still being seen at [facility].  Was having issues with vertigo but that has improved slowly.   In August 2022 she was admitted to the hospital with chest pain.  MI was ruled out but white blood  cell count was 14,300.  Chest CT showed bilateral pleural effusions and centrilobular emphysema.  Treated compression fracture at T12 noted. She was found to have moderate pericardial effusion with overlying enhancement of the pericardium concerning for pericarditis.  She was admitted to the hospital and was also found to have paroxysmal atrial fibrillation.  Echocardiogram showed ejection fraction to be by estimation 55 to 60% with normal LV function.  She was treated with IV amiodarone for atrial fibs and converted to sinus rhythm.  Heparin was started.  Anticoagulation was subsequently discontinued.  She was continued on colchicine and ibuprofen.  Sed rate was 117 and CRP was 23.5.  Sed rate subsequently improved from 128 on August 23 to 303 on September 29.  Creatinine improved from 1.49 in mid September to 0.96.   History of GE reflux treated with Protonix.  History of mild memory loss treated with Namenda.   Tonsillectomy 1963, hysterectomy without oophorectomy 1979, cholecystectomy 1993, thyroidectomy 1968.   Is seen by Dr. Albin Felling in [place] for overactive bladder.  Had joint reconstruction of right thumb by Dr. Teressa Senter in 2008.  In 2018 she fell onto her knees and had increasing pain in the left buttock and in December 2019 had decompression L3-L4 by Dr. Danielle Dess.  Mammogram last completed 10/22/21. No mammographic evidence of malignancy.  Had normal colonoscopy 2018 by Dr. Ewing Schlein. Repeat due 2028.   Creatinine elevated at 1.13, eGFR low at 51. Alkaline phosphatase low at 30. Glucose normal. Blood proteins normal. MCV elevated at 100.3, MCH elevated  at 33.6. Absolute eosinophils low at 7. Folate at 6.1.  Social history: Married with 2 adult children.  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 She is smoking 2-10 cigarettes a day.   Family history: Unknown as she is  adopted.  Past Medical History:  Diagnosis Date   Allergy    Anxiety    Arthritis    Atrial fibrillation (HCC)    Complication of anesthesia    "difficulty waking up. I could hear them but I couldn't wake up"   COPD (chronic obstructive pulmonary disease) (HCC)    Depression    Emphysema    Esophagitis    Herpes simplex    Hypertension    Hypothyroidism    Migraines    Osteopenia    Vitamin D deficiency      Family History  Adopted: Yes  Family history unknown: Yes     Social History   Social History Narrative   Lives at home with husband.   Right-handed.   1 cup caffeine daily.     Review of Systems  Constitutional:  Negative for chills, fever, malaise/fatigue and weight loss.  HENT:  Negative for hearing loss, sinus pain and sore throat.   Respiratory:  Negative for cough, hemoptysis and shortness of breath.   Cardiovascular:  Negative for chest pain, palpitations, leg swelling and PND.  Gastrointestinal:  Negative for abdominal pain, constipation, diarrhea, heartburn, nausea and vomiting.  Genitourinary:  Negative for dysuria, frequency and urgency.  Musculoskeletal:  Positive for back pain (Lower). Negative for myalgias and neck pain.  Skin:  Negative for itching and rash.  Neurological:  Negative for dizziness, tingling, seizures and headaches.  Endo/Heme/Allergies:  Negative for polydipsia.  Psychiatric/Behavioral:  Negative for depression. The patient is not nervous/anxious.       Objective:   Vitals: BP 100/80   Pulse 69   Ht 5' 2.5" (1.588 m)   Wt 129 lb (58.5 kg)   SpO2 97%   BMI 23.22 kg/m   Physical Exam Vitals and nursing note reviewed.  Constitutional:      General: She is not in acute distress.    Appearance: Normal appearance. She is not ill-appearing or toxic-appearing.  HENT:     Head: Normocephalic and atraumatic.     Right Ear: Hearing, tympanic membrane, ear canal and external ear normal.     Left Ear: Hearing, tympanic  membrane, ear canal and external ear normal.     Mouth/Throat:     Pharynx: Oropharynx is clear.  Eyes:     Extraocular Movements: Extraocular movements intact.     Pupils: Pupils are equal, round, and reactive to light.  Neck:     Thyroid: No thyroid mass, thyromegaly or thyroid tenderness.     Vascular: No carotid bruit.  Cardiovascular:     Rate and Rhythm: Normal rate and regular rhythm. No extrasystoles are present.    Pulses:          Dorsalis pedis pulses are 1+ on the right side and 1+ on the left side.     Heart sounds: Normal heart sounds. No murmur heard.    No friction rub. No gallop.  Pulmonary:     Effort: Pulmonary effort is normal.     Breath sounds: Normal breath sounds. No decreased breath sounds, wheezing, rhonchi or rales.  Chest:  Chest wall: No mass.  Abdominal:     Palpations: Abdomen is soft. There is no hepatomegaly, splenomegaly or mass.     Tenderness: There is no abdominal tenderness.     Hernia: No hernia is present.  Musculoskeletal:     Cervical back: Normal range of motion.     Right lower leg: No edema.     Left lower leg: No edema.  Lymphadenopathy:     Cervical: No cervical adenopathy.     Upper Body:     Right upper body: No supraclavicular adenopathy.     Left upper body: No supraclavicular adenopathy.  Skin:    General: Skin is warm and dry.     Comments: Healing abrasions lower extremities without drainage.  No evidence of secondary infection. Wounds noted on previous exam.  Neurological:     General: No focal deficit present.     Mental Status: She is alert and oriented to person, place, and time. Mental status is at baseline.     Sensory: Sensation is intact.     Motor: Motor function is intact. No weakness.     Deep Tendon Reflexes: Reflexes are normal and symmetric.  Psychiatric:        Attention and Perception: Attention normal.        Mood and Affect: Mood normal.        Speech: Speech normal.        Behavior: Behavior  normal.        Thought Content: Thought content normal.        Cognition and Memory: Cognition normal.        Judgment: Judgment normal.      Most recent functional status assessment:    10/16/2022    6:10 PM  In your present state of health, do you have any difficulty performing the following activities:  Hearing? 0  Vision? 1  Difficulty concentrating or making decisions? 0  Walking or climbing stairs? 0  Dressing or bathing? 0  Doing errands, shopping? 0  Preparing Food and eating ? N  Using the Toilet? N  In the past six months, have you accidently leaked urine? Y  Do you have problems with loss of bowel control? Y  Managing your Medications? N  Managing your Finances? N  Housekeeping or managing your Housekeeping? Y   Most recent fall risk assessment:    10/16/2022    6:10 PM  Fall Risk   Falls in the past year? 1  Number falls in past yr: 1  Injury with Fall? 1    Most recent depression screenings:    07/15/2022    2:46 PM 06/17/2022    2:37 PM  PHQ 2/9 Scores  PHQ - 2 Score 0 0   Most recent cognitive screening:    10/26/2021    3:29 PM  6CIT Screen  What Year? 0 points  What month? 0 points  What time? 0 points  Count back from 20 0 points  Months in reverse 0 points  Repeat phrase 0 points  Total Score 0 points     Results:   Studies obtained and personally reviewed by me:  Mammogram last completed 10/22/21. No mammographic evidence of malignancy.  Had normal colonoscopy 2018 by Dr. Ewing Schlein. Repeat due 2028.   Labs:       Component Value Date/Time   NA 141 10/21/2022 1137   K 4.8 10/21/2022 1137   CL 107 10/21/2022 1137   CO2 29 10/21/2022 1137  GLUCOSE 86 10/21/2022 1137   BUN 15 10/21/2022 1137   CREATININE 1.13 (H) 10/21/2022 1137   CALCIUM 9.4 10/21/2022 1137   PROT 6.5 10/21/2022 1137   ALBUMIN 4.0 10/07/2016 0925   AST 18 10/21/2022 1137   ALT 6 10/21/2022 1137   ALKPHOS 49 10/07/2016 0925   BILITOT 0.4 10/21/2022 1137    GFRNONAA >60 10/30/2020 0758   GFRNONAA 52 (L) 05/20/2020 1619   GFRAA 60 05/20/2020 1619     Lab Results  Component Value Date   WBC 7.3 10/21/2022   HGB 13.2 10/21/2022   HCT 39.4 10/21/2022   MCV 100.3 (H) 10/21/2022   PLT 249 10/21/2022    Lab Results  Component Value Date   CHOL 119 10/21/2022   HDL 41 (L) 10/21/2022   LDLCALC 56 10/21/2022   TRIG 137 10/21/2022   CHOLHDL 2.9 10/21/2022    Lab Results  Component Value Date   HGBA1C 4.9 10/07/2016     Lab Results  Component Value Date   TSH 2.96 10/21/2022    Assessment & Plan:   Multiple contusions bilateral knees; multiple abrasions bilateral lower legs; avulsion left knee: no evidence of secondary infection. Wrapped with Telfa in the office today. Continue applying mupirocin daily and covering if going outside.  Continue wrapping until healed.  This is a subsequent encounter from August 19.  Lumbar spinal stenosis: Lumbar fusion planned in the near future with Dr. Danielle Dess. Denies chest pain, shortness of breath. Recommended that she see Cardiology to be cleared with previous hx of pericarditis and hx of smoking.  Patient says low back pain is unbearable.  She is taking Vitamin B12 1,000 mcg daily.   Anxiety and Depression: stable with diazepam 2.5 - 5 mg daily as needed, venlafaxine XR 300 with breakfast.  Essential Tremor: treated with propranolol 40 mg twice daily, Valium as needed, per neurology.  Mild Cognitive Impairment: she is taking Namenda per neurologist, Dr. Terrace Arabia.  Bilateral Hearing Loss: she wears hearing aids.  GERD: stable with pantoprazole 40 mg daily.  Hyperlipidemia: treated with rosuvastatin 5 mg daily, fenofibrate 160 mg daily. HDL low at 41, lipids otherwise normal.  Hypothyroidism: treated with levothyroxine 88 mcg daily. TSH at 2.96.  Osteopenia: she is on Prolia 60 mg every 6 months . T-score of -1.7 at left femur neck on 05/05/21.Needs repeat study 2025.  Previously received this per  Dr. Cleophas Dunker, orthopedist, but Dr. Cleophas Dunker has discontinued giving Prolia injections and she will now receive Prolia here.  Health maintenance due for mammogram in September, has appt.   Overactive Bladder: seen by Dr. Albin Felling in [place].   Mild Elevated Creatinine : creatinine elevated at 1.13, eGFR low at 51. Ordered repeat creatinine. Repeat is 1.32 and urine SG is 1.020. Needs to hold Meloxicam and stay well hydrated.   EKG today showed sinus bradycardia. Last EKG on 02/12/22 showed normal sinus rhythm.  Mammogram last completed 10/22/21. No mammographic evidence of malignancy.  Had normal colonoscopy 2018 by Dr. Ewing Schlein. Repeat due 2028.   Vaccine counseling: UTD on tetanus, pneumococcal 20 vaccines. Plans to receive flu vaccine in the Fall. Declines Covid-19 booster.  Return in 6 months or as needed.  She is now receiving Prolia here instead of with Dr. Cleophas Dunker.  Cardiology consultation for surgery clearance.     Annual wellness visit done today including the all of the following: Reviewed patient's Family Medical History Reviewed and updated list of patient's medical providers Assessment of cognitive impairment was done Assessed  patient's functional ability Established a written schedule for health screening services Health Risk Assessent Completed and Reviewed  Discussed health benefits of physical activity, and encouraged her to engage in regular exercise appropriate for her age and condition.        I,Alexander Ruley,acting as a Neurosurgeon for Margaree Mackintosh, MD.,have documented all relevant documentation on the behalf of Margaree Mackintosh, MD,as directed by  Margaree Mackintosh, MD while in the presence of Margaree Mackintosh, MD.   I, Margaree Mackintosh, MD, have reviewed all documentation for this visit. The documentation on 10/29/22 for the exam, diagnosis, procedures, and orders are all accurate and complete.

## 2022-10-21 NOTE — Telephone Encounter (Signed)
Received Surgery Clearance Form from [facility] today for Surgery clearance for   Procedure: Lumbar Fusion / General Anesthesia  Surgeon: Dr Barnett Abu  Fax form with any office notes back to [facility] [phone]

## 2022-10-21 NOTE — Telephone Encounter (Signed)
Patient has appointment on 10/28/2022

## 2022-10-22 ENCOUNTER — Other Ambulatory Visit: Payer: Self-pay

## 2022-10-22 ENCOUNTER — Telehealth: Payer: Self-pay

## 2022-10-22 DIAGNOSIS — R718 Other abnormality of red blood cells: Secondary | ICD-10-CM

## 2022-10-22 NOTE — Telephone Encounter (Signed)
Patient called to let us know that her wound on her left knee has a yellow discharge and would like an antibiotic. Advised patient to do a virtual visit on mychart or seek medical attention at an urgent care. Patient verbalized understanding.

## 2022-10-22 NOTE — Progress Notes (Signed)
Added

## 2022-10-24 LAB — COMPLETE METABOLIC PANEL WITH GFR
AG Ratio: 1.4 (calc) (ref 1.0–2.5)
ALT: 6 U/L (ref 6–29)
AST: 18 U/L (ref 10–35)
Albumin: 3.8 g/dL (ref 3.6–5.1)
Alkaline phosphatase (APISO): 30 U/L — ABNORMAL LOW (ref 37–153)
BUN/Creatinine Ratio: 13 (calc) (ref 6–22)
BUN: 15 mg/dL (ref 7–25)
CO2: 29 mmol/L (ref 20–32)
Calcium: 9.4 mg/dL (ref 8.6–10.4)
Chloride: 107 mmol/L (ref 98–110)
Creat: 1.13 mg/dL — ABNORMAL HIGH (ref 0.60–1.00)
Globulin: 2.7 g/dL (ref 1.9–3.7)
Glucose, Bld: 86 mg/dL (ref 65–99)
Potassium: 4.8 mmol/L (ref 3.5–5.3)
Sodium: 141 mmol/L (ref 135–146)
Total Bilirubin: 0.4 mg/dL (ref 0.2–1.2)
Total Protein: 6.5 g/dL (ref 6.1–8.1)
eGFR: 51 mL/min/{1.73_m2} — ABNORMAL LOW (ref >=60)

## 2022-10-24 LAB — CBC WITH DIFFERENTIAL/PLATELET
Absolute Monocytes: 380 {cells}/uL (ref 200–950)
Basophils Absolute: 29 {cells}/uL (ref 0–200)
Basophils Relative: 0.4 %
Eosinophils Absolute: 7 {cells}/uL — ABNORMAL LOW (ref 15–500)
Eosinophils Relative: 0.1 %
HCT: 39.4 % (ref 35.0–45.0)
Hemoglobin: 13.2 g/dL (ref 11.7–15.5)
Lymphs Abs: 1898 cells/uL (ref 850–3900)
MCH: 33.6 pg — ABNORMAL HIGH (ref 27.0–33.0)
MCHC: 33.5 g/dL (ref 32.0–36.0)
MCV: 100.3 fL — ABNORMAL HIGH (ref 80.0–100.0)
MPV: 10.2 fL (ref 7.5–12.5)
Monocytes Relative: 5.2 %
Neutro Abs: 4986 cells/uL (ref 1500–7800)
Neutrophils Relative %: 68.3 %
Platelets: 249 10*3/uL (ref 140–400)
RBC: 3.93 10*6/uL (ref 3.80–5.10)
RDW: 12.5 % (ref 11.0–15.0)
Total Lymphocyte: 26 %
WBC: 7.3 10*3/uL (ref 3.8–10.8)

## 2022-10-24 LAB — VITAMIN B12: Vitamin B-12: 363 pg/mL (ref 200–1100)

## 2022-10-24 LAB — FOLATE: Folate: 6.1 ng/mL

## 2022-10-24 LAB — TEST AUTHORIZATION

## 2022-10-24 LAB — LIPID PANEL
Cholesterol: 119 mg/dL (ref <200)
HDL: 41 mg/dL — ABNORMAL LOW (ref >=50)
LDL Cholesterol (Calc): 56 mg/dL
Non-HDL Cholesterol (Calc): 78 mg/dL (ref <130)
Total CHOL/HDL Ratio: 2.9 (calc) (ref <5.0)
Triglycerides: 137 mg/dL (ref <150)

## 2022-10-24 LAB — TSH: TSH: 2.96 m[IU]/L (ref 0.40–4.50)

## 2022-10-25 DIAGNOSIS — K529 Noninfective gastroenteritis and colitis, unspecified: Secondary | ICD-10-CM | POA: Diagnosis not present

## 2022-10-27 ENCOUNTER — Ambulatory Visit: Payer: Medicare HMO

## 2022-10-27 ENCOUNTER — Encounter: Payer: Self-pay | Admitting: Internal Medicine

## 2022-10-28 ENCOUNTER — Encounter: Payer: Self-pay | Admitting: Internal Medicine

## 2022-10-28 ENCOUNTER — Ambulatory Visit (INDEPENDENT_AMBULATORY_CARE_PROVIDER_SITE_OTHER): Payer: Medicare HMO | Admitting: Internal Medicine

## 2022-10-28 ENCOUNTER — Other Ambulatory Visit: Payer: Self-pay

## 2022-10-28 VITALS — BP 100/80 | HR 69 | Ht 62.5 in | Wt 129.0 lb

## 2022-10-28 DIAGNOSIS — F419 Anxiety disorder, unspecified: Secondary | ICD-10-CM | POA: Diagnosis not present

## 2022-10-28 DIAGNOSIS — M8580 Other specified disorders of bone density and structure, unspecified site: Secondary | ICD-10-CM

## 2022-10-28 DIAGNOSIS — R7989 Other specified abnormal findings of blood chemistry: Secondary | ICD-10-CM | POA: Diagnosis not present

## 2022-10-28 DIAGNOSIS — Z0000 Encounter for general adult medical examination without abnormal findings: Secondary | ICD-10-CM

## 2022-10-28 DIAGNOSIS — G250 Essential tremor: Secondary | ICD-10-CM | POA: Diagnosis not present

## 2022-10-28 DIAGNOSIS — S8012XD Contusion of left lower leg, subsequent encounter: Secondary | ICD-10-CM

## 2022-10-28 DIAGNOSIS — H903 Sensorineural hearing loss, bilateral: Secondary | ICD-10-CM

## 2022-10-28 DIAGNOSIS — E039 Hypothyroidism, unspecified: Secondary | ICD-10-CM

## 2022-10-28 DIAGNOSIS — I10 Essential (primary) hypertension: Secondary | ICD-10-CM

## 2022-10-28 DIAGNOSIS — I700 Atherosclerosis of aorta: Secondary | ICD-10-CM | POA: Diagnosis not present

## 2022-10-28 DIAGNOSIS — F32A Depression, unspecified: Secondary | ICD-10-CM

## 2022-10-28 DIAGNOSIS — M48062 Spinal stenosis, lumbar region with neurogenic claudication: Secondary | ICD-10-CM

## 2022-10-28 DIAGNOSIS — K219 Gastro-esophageal reflux disease without esophagitis: Secondary | ICD-10-CM

## 2022-10-28 DIAGNOSIS — J449 Chronic obstructive pulmonary disease, unspecified: Secondary | ICD-10-CM

## 2022-10-28 DIAGNOSIS — G3184 Mild cognitive impairment, so stated: Secondary | ICD-10-CM

## 2022-10-28 DIAGNOSIS — Z8679 Personal history of other diseases of the circulatory system: Secondary | ICD-10-CM

## 2022-10-28 LAB — POCT URINALYSIS DIP (CLINITEK)
Bilirubin, UA: NEGATIVE
Blood, UA: NEGATIVE
Glucose, UA: NEGATIVE mg/dL
Ketones, POC UA: NEGATIVE mg/dL
Leukocytes, UA: NEGATIVE
Nitrite, UA: NEGATIVE
POC PROTEIN,UA: NEGATIVE
Spec Grav, UA: 1.02 (ref 1.010–1.025)
Urobilinogen, UA: 0.2 E.U./dL
pH, UA: 6 (ref 5.0–8.0)

## 2022-10-29 ENCOUNTER — Telehealth: Payer: Self-pay | Admitting: *Deleted

## 2022-10-29 ENCOUNTER — Telehealth: Payer: Self-pay

## 2022-10-29 ENCOUNTER — Encounter: Payer: Self-pay | Admitting: Internal Medicine

## 2022-10-29 LAB — CREATININE, SERUM: Creat: 1.32 mg/dL — ABNORMAL HIGH (ref 0.60–1.00)

## 2022-10-29 NOTE — Telephone Encounter (Signed)
   Name: Megan Williams  DOB: [birth year]  MRN: [id number]  Primary Cardiologist: Christell Constant, MD   Preoperative team, please contact this patient and set up a phone call appointment for further preoperative risk assessment. Please obtain consent and complete medication review. Thank you for your help.  I confirm that guidance regarding antiplatelet and oral anticoagulation therapy has been completed and, if necessary, noted below (none requested).    Joylene Grapes, NP 10/29/2022, 11:58 AM [facility]

## 2022-10-29 NOTE — Telephone Encounter (Signed)
Pt has been scheduled for tele pre op appt 11/10/22 @ 10:20. Med rec and consent are done.     Patient Consent for Virtual Visit        Megan Williams has provided verbal consent on 10/29/2022 for a virtual visit (video or telephone).   CONSENT FOR VIRTUAL VISIT FOR:  Megan Williams  By participating in this virtual visit I agree to the following:  I hereby voluntarily request, consent and authorize [facility] and its employed or contracted physicians, physician assistants, nurse practitioners or other licensed health care professionals (the Practitioner), to provide me with telemedicine health care services (the "Services") as deemed necessary by the treating Practitioner. I acknowledge and consent to receive the Services by the Practitioner via telemedicine. I understand that the telemedicine visit will involve communicating with the Practitioner through live audiovisual communication technology and the disclosure of certain medical information by electronic transmission. I acknowledge that I have been given the opportunity to request an in-person assessment or other available alternative prior to the telemedicine visit and am voluntarily participating in the telemedicine visit.  I understand that I have the right to withhold or withdraw my consent to the use of telemedicine in the course of my care at any time, without affecting my right to future care or treatment, and that the Practitioner or I may terminate the telemedicine visit at any time. I understand that I have the right to inspect all information obtained and/or recorded in the course of the telemedicine visit and may receive copies of available information for a reasonable fee.  I understand that some of the potential risks of receiving the Services via telemedicine include:  Delay or interruption in medical evaluation due to technological equipment failure or disruption; Information transmitted may not be sufficient (e.g. poor  resolution of images) to allow for appropriate medical decision making by the Practitioner; and/or  In rare instances, security protocols could fail, causing a breach of personal health information.  Furthermore, I acknowledge that it is my responsibility to provide information about my medical history, conditions and care that is complete and accurate to the best of my ability. I acknowledge that Practitioner's advice, recommendations, and/or decision may be based on factors not within their control, such as incomplete or inaccurate data provided by me or distortions of diagnostic images or specimens that may result from electronic transmissions. I understand that the practice of medicine is not an exact science and that Practitioner makes no warranties or guarantees regarding treatment outcomes. I acknowledge that a copy of this consent can be made available to me via my patient portal [facility] MyChart), or I can request a printed copy by calling the office of [facility].    I understand that my insurance will be billed for this visit.   I have read or had this consent read to me. I understand the contents of this consent, which adequately explains the benefits and risks of the Services being provided via telemedicine.  I have been provided ample opportunity to ask questions regarding this consent and the Services and have had my questions answered to my satisfaction. I give my informed consent for the services to be provided through the use of telemedicine in my medical care

## 2022-10-29 NOTE — Telephone Encounter (Signed)
...     Pre-operative Risk Assessment    Patient Name: Megan Williams  DOB: August 07, 1945 MRN: [id number]      Request for Surgical Clearance    Procedure:   LUMBAR FUSION  Date of Surgery:  Clearance TBD                                 Surgeon:  DR Barnett Abu Surgeon's Group or Practice Name:  [facility] Phone number:  [phone] Fax number:  [phone]   Type of Clearance Requested:   - Medical    Type of Anesthesia:  General    Additional requests/questions:   last o/v 12.15.23. no new appt  Signed, Renee Ramus   10/29/2022, 8:35 AM

## 2022-10-29 NOTE — Patient Instructions (Addendum)
Patient declines further COVID vaccinations.  She will receive flu vaccine in the Fall.  Would like for her to see Cardiology prior to lumbar surgery.  Referral will be made.  Continue current medications.  Is on Prolia every 6 months

## 2022-10-29 NOTE — Telephone Encounter (Signed)
Left message for pt to call back to schedule a tele pre op appt.  

## 2022-10-29 NOTE — Telephone Encounter (Signed)
Pt has been scheduled for tele pre op appt 11/10/22 @ 10:20. Med rec and consent are done.

## 2022-11-02 NOTE — Telephone Encounter (Signed)
Dr Lenord Fellers faxed signed form

## 2022-11-10 ENCOUNTER — Ambulatory Visit: Payer: Medicare HMO | Attending: Internal Medicine

## 2022-11-10 DIAGNOSIS — Z01810 Encounter for preprocedural cardiovascular examination: Secondary | ICD-10-CM

## 2022-11-10 NOTE — Progress Notes (Signed)
Virtual Visit via Telephone Note   Because of Megan Williams's co-morbid illnesses, she is at least at moderate risk for complications without adequate follow up.  This format is felt to be most appropriate for this patient at this time.  The patient did not have access to video technology/had technical difficulties with video requiring transitioning to audio format only (telephone).  All issues noted in this document were discussed and addressed.  No physical exam could be performed with this format.  Please refer to the patient's chart for her consent to telehealth for [facility].  Evaluation Performed:  Preoperative cardiovascular risk assessment _____________   Date:  11/10/2022   Patient ID:  Megan Williams, DOB [birth year], MRN [id number] Patient Location:  Home Provider location:   Office  Primary Care Provider:  Margaree Mackintosh, MD Primary Cardiologist:  Christell Constant, MD  Chief Complaint / Patient Profile   77 y.o. y/o female with a h/o pericardial effusion, reactive atrial fibrillation, anxiety, COPD who is pending lumbar fusion and presents today for telephonic preoperative cardiovascular risk assessment.  History of Present Illness    Megan Williams is a 77 y.o. female who presents via audio/video conferencing for a telehealth visit today.  Pt was last seen in cardiology clinic on 02/12/2022 by Dr. Izora Ribas.  At that time CARNIE WUNDERLICH was doing well .  The patient is now pending procedure as outlined above. Since her last visit, she remains stable from a cardiac standpoint.  Today she denies chest pain, shortness of breath, lower extremity edema, fatigue, palpitations, melena, hematuria, hemoptysis, diaphoresis, weakness, presyncope, syncope, orthopnea, and PND.   Past Medical History    Past Medical History:  Diagnosis Date   Allergy    Anxiety    Arthritis    Atrial fibrillation (HCC)    Complication of anesthesia    "difficulty waking up. I  could hear them but I couldn't wake up"   COPD (chronic obstructive pulmonary disease) (HCC)    Depression    Emphysema    Esophagitis    Herpes simplex    Hypertension    Hypothyroidism    Migraines    Osteopenia    Vitamin D deficiency    Past Surgical History:  Procedure Laterality Date   BACK SURGERY  02/14/2018   lumbar 3-4  fusion   BREAST EXCISIONAL BIOPSY Left 1979   Benign    CATARACT EXTRACTION, BILATERAL     CERVICAL FUSION     CHOLECYSTECTOMY     KYPHOPLASTY  09/19/2015   Dr. Danielle Dess  T12   LUMBAR DISC SURGERY  11/11/2017   L3-4   Thumb surg     THYROIDECTOMY     TONSILLECTOMY     VAGINAL HYSTERECTOMY      Allergies  Allergies  Allergen Reactions   Doxycycline Hives, Other (See Comments), Itching and Nausea And Vomiting    Skin peeling  Other reaction(s): Stevens-Johnson Syndrome  Other Reaction(s): stevens-johnson syndrome  Skin peeling  Skin peeling    Other reaction(s): Stevens-Johnson Syndrome   Latex Rash, Hives and Itching   Morphine Itching and Other (See Comments)    Other reaction(s): Confusion, Dizziness   Penicillins Hives, Diarrhea and Nausea Only    Has patient had a PCN reaction causing immediate rash, facial/tongue/throat swelling, SOB or lightheadedness with hypotension: No Has patient had a PCN reaction causing severe rash involving mucus membranes or skin necrosis: No Has patient had a PCN reaction that required  hospitalization: No Has patient had a PCN reaction occurring within the last 10 years: No If all of the above answers are "NO", then may proceed with Cephalosporin use. Other reaction(s): Abdominal Pain   Grass Extracts [Gramineae Pollens] Cough and Itching    Other reaction(s): Eye Redness   Short Ragweed Pollen Ext Cough and Itching    Other reaction(s): Eye Redness, Other   Celebrex [Celecoxib] Other (See Comments)    Blistering, pain, swelling   Cephalosporins Nausea Only    Diarrhea, hives   Gabapentin      Causes TICS    Morphine And Codeine Itching    Home Medications    Prior to Admission medications   Medication Sig Start Date End Date Taking? Authorizing Provider  Cholecalciferol (VITAMIN D) 50 MCG (2000 UT) tablet Take 2,000 Units by mouth daily.    [provider]  Cyanocobalamin (B-12) 1000 MCG SUBL Place 1,000 mcg under the tongue daily.    [provider]  denosumab (PROLIA) 60 MG/ML SOLN injection Inject 60 mg into the skin every 6 (six) months. Administer in upper arm, thigh, or abdomen    [provider]  diazepam (VALIUM) 5 MG tablet Take 2.5-5 mg by mouth daily as needed for anxiety. 08/27/20   [provider]  estradiol (ESTRACE) 1 MG tablet Take 1 tablet (1 mg total) by mouth daily. 08/16/22   Wyline Beady A, NP  fenofibrate 160 MG tablet TAKE 1 TABLET BY MOUTH DAILY. 06/14/22   Margaree Mackintosh, MD  fexofenadine (ALLEGRA) 60 MG tablet Take 60 mg by mouth.    [provider]  fluticasone (FLONASE) 50 MCG/ACT nasal spray Place 1 spray into both nostrils daily.    [provider]  ibuprofen (IBU) 600 MG tablet TAKE 1 TABLET BY MOUTH 2 TIMES DAILY. 03/22/22   Margaree Mackintosh, MD  levothyroxine (SYNTHROID) 88 MCG tablet TAKE 1 TABLET (88 MCG TOTAL) BY MOUTH DAILY. 10/20/22   Margaree Mackintosh, MD  Melatonin 10 MG TABS Take 10 mg by mouth at bedtime.    [provider]  memantine (NAMENDA) 10 MG tablet Take 1 tablet (10 mg total) by mouth 2 (two) times daily. TAKE 10 mg TABLET BY MOUTH 2 TIMES DAILY. 06/01/22   Glean Salvo, NP  Multiple Vitamin (MULTI-VITAMIN) tablet Take by mouth. 06/11/15   [provider]  mupirocin ointment (BACTROBAN) 2 % Apply 1 Application topically 2 (two) times daily. 10/18/22   Margaree Mackintosh, MD  pantoprazole (PROTONIX) 40 MG tablet TAKE 1 TABLET BY MOUTH DAILY. 10/11/21   Margaree Mackintosh, MD  propranolol (INDERAL) 40 MG tablet Take 1 tablet (40 mg total) by mouth 2 (two) times daily. 06/01/22    Glean Salvo, NP  RESTASIS 0.05 % ophthalmic emulsion Place 1 drop into both eyes 2 (two) times daily. 10/18/19   [provider]  rOPINIRole (REQUIP) 0.5 MG tablet Take 0.5 mg by mouth at bedtime as needed (as directed). 09/11/20   [provider]  rosuvastatin (CRESTOR) 5 MG tablet Take 1 tablet (5 mg total) by mouth daily. 07/29/22   Christell Constant, MD  traZODone (DESYREL) 100 MG tablet Take 200 mg by mouth at bedtime.    [provider]  valACYclovir (VALTREX) 500 MG tablet TAKE 1 TABLET BY MOUTH TWICE A DAY FOR 5 DAYS 04/22/22   Margaree Mackintosh, MD  venlafaxine XR (EFFEXOR-XR) 150 MG 24 hr capsule Take 300 mg by mouth daily with breakfast.  [provider]    Physical Exam    Vital Signs:  WRENLEE MULRONEY does not have vital signs available for review today.  Given telephonic nature of communication, physical exam is limited. AAOx3. NAD. Normal affect.  Speech and respirations are unlabored.  Accessory Clinical Findings    None  Assessment & Plan    1.  Preoperative Cardiovascular Risk Assessment: Lumbar fusion, TBD, Dr. Barnett Abu, neurosurgery and spine, fax number [phone]      Primary Cardiologist: Christell Constant, MD  Chart reviewed as part of pre-operative protocol coverage. Given past medical history and time since last visit, based on ACC/AHA guidelines, DAIZHA ALPHONSE would be at acceptable risk for the planned procedure without further cardiovascular testing.   Patient was advised that if she develops new symptoms prior to surgery to contact our office to arrange a follow-up appointment.  He verbalized understanding.  Her RCRI is a class I risk, 0.4% risk of major cardiac event.  She is able to complete greater than 4 METS of physical activity.  I will route this recommendation to the requesting party via Epic fax function and remove from pre-op pool.       Time:   Today, I have spent 5 minutes with the  patient with telehealth technology discussing medical history, symptoms, and management plan.  Prior to patient's phone evaluation I spent greater than 10 minutes reviewing their past medical history and cardiac medications.    Ronney Asters, NP  11/10/2022, 7:10 AM

## 2022-11-16 ENCOUNTER — Other Ambulatory Visit: Payer: Medicare HMO

## 2022-11-16 DIAGNOSIS — F4323 Adjustment disorder with mixed anxiety and depressed mood: Secondary | ICD-10-CM | POA: Diagnosis not present

## 2022-11-16 DIAGNOSIS — Z79899 Other long term (current) drug therapy: Secondary | ICD-10-CM | POA: Diagnosis not present

## 2022-11-16 DIAGNOSIS — F3342 Major depressive disorder, recurrent, in full remission: Secondary | ICD-10-CM | POA: Diagnosis not present

## 2022-11-16 DIAGNOSIS — F330 Major depressive disorder, recurrent, mild: Secondary | ICD-10-CM | POA: Diagnosis not present

## 2022-11-16 DIAGNOSIS — F411 Generalized anxiety disorder: Secondary | ICD-10-CM | POA: Diagnosis not present

## 2022-11-16 DIAGNOSIS — G4700 Insomnia, unspecified: Secondary | ICD-10-CM | POA: Diagnosis not present

## 2022-11-16 DIAGNOSIS — Z5181 Encounter for therapeutic drug level monitoring: Secondary | ICD-10-CM | POA: Diagnosis not present

## 2022-11-17 ENCOUNTER — Ambulatory Visit: Payer: Medicare HMO

## 2022-11-18 ENCOUNTER — Encounter: Payer: Self-pay | Admitting: Internal Medicine

## 2022-11-18 NOTE — Telephone Encounter (Signed)
  Patient has called to ask that we please send the clearance over to Dr Verlee Rossetti office so that she can get her procedure scheduled.

## 2022-11-18 NOTE — Telephone Encounter (Signed)
error 

## 2022-11-19 NOTE — Telephone Encounter (Signed)
Spoke with to inform her that I have sent a fax to requesting provider's office so that she may schedule her procedure. Patient verbalized understanding and thanked me for the call.

## 2022-11-22 DIAGNOSIS — N3281 Overactive bladder: Secondary | ICD-10-CM | POA: Diagnosis not present

## 2022-11-22 DIAGNOSIS — N319 Neuromuscular dysfunction of bladder, unspecified: Secondary | ICD-10-CM | POA: Diagnosis not present

## 2022-11-30 ENCOUNTER — Ambulatory Visit
Admission: RE | Admit: 2022-11-30 | Discharge: 2022-11-30 | Disposition: A | Discharge status: Home / Self Care | Payer: Medicare HMO | Source: Ambulatory Visit | Attending: Internal Medicine | Admitting: Internal Medicine

## 2022-11-30 DIAGNOSIS — Z1231 Encounter for screening mammogram for malignant neoplasm of breast: Secondary | ICD-10-CM

## 2022-12-06 ENCOUNTER — Encounter: Payer: Self-pay | Admitting: Internal Medicine

## 2022-12-07 DIAGNOSIS — H5203 Hypermetropia, bilateral: Secondary | ICD-10-CM | POA: Diagnosis not present

## 2022-12-07 DIAGNOSIS — H02831 Dermatochalasis of right upper eyelid: Secondary | ICD-10-CM | POA: Diagnosis not present

## 2022-12-07 DIAGNOSIS — H02834 Dermatochalasis of left upper eyelid: Secondary | ICD-10-CM | POA: Diagnosis not present

## 2022-12-07 DIAGNOSIS — H16223 Keratoconjunctivitis sicca, not specified as Sjogren's, bilateral: Secondary | ICD-10-CM | POA: Diagnosis not present

## 2022-12-07 DIAGNOSIS — H43813 Vitreous degeneration, bilateral: Secondary | ICD-10-CM | POA: Diagnosis not present

## 2022-12-07 DIAGNOSIS — H52203 Unspecified astigmatism, bilateral: Secondary | ICD-10-CM | POA: Diagnosis not present

## 2022-12-07 DIAGNOSIS — H524 Presbyopia: Secondary | ICD-10-CM | POA: Diagnosis not present

## 2022-12-10 DIAGNOSIS — H5213 Myopia, bilateral: Secondary | ICD-10-CM | POA: Diagnosis not present

## 2022-12-17 ENCOUNTER — Other Ambulatory Visit: Payer: Self-pay | Admitting: Neurological Surgery

## 2022-12-26 ENCOUNTER — Encounter: Payer: Self-pay | Admitting: Internal Medicine

## 2022-12-28 ENCOUNTER — Other Ambulatory Visit: Payer: Self-pay | Admitting: Neurological Surgery

## 2022-12-28 ENCOUNTER — Other Ambulatory Visit: Payer: Self-pay | Admitting: Internal Medicine

## 2022-12-29 NOTE — Progress Notes (Signed)
Surgical Instructions   Your procedure is scheduled on Monday, 01/03/23. Report to [facility] Main Entrance "A" at 9:00 A.M., then check in with the Admitting office. Any questions or running late day of surgery: call [phone]  Questions prior to your surgery date: call [phone], Monday-Friday, 8am-4pm. If you experience any cold or flu symptoms such as cough, fever, chills, shortness of breath, etc. between now and your scheduled surgery, please notify us at the above number.     Remember:  Do not eat or drink after midnight the night before your surgery     Take these medicines the morning of surgery with A SIP OF WATER  acetaminophen (TYLENOL)  estradiol (ESTRACE)  fexofenadine (ALLEGRA)  fluticasone (FLONASE)  levothyroxine (SYNTHROID)  memantine (NAMENDA)  pregabalin (LYRICA)  propranolol (INDERAL)  RESTASIS eye drops venlafaxine XR (EFFEXOR-XR)  Vibegron (GEMTESA)   May take these medicines IF NEEDED: diazepam (VALIUM)  Olopatadine HCl (PATADAY OP)  rOPINIRole (REQUIP)   One week prior to surgery, STOP taking any Aspirin (unless otherwise instructed by your surgeon) Aleve, Naproxen, Ibuprofen, Motrin, Advil, Goody's, BC's, all herbal medications, fish oil, and non-prescription vitamins.                     Do NOT Smoke (Tobacco/Vaping) for 24 hours prior to your procedure.  If you use a CPAP at night, you may bring your mask/headgear for your overnight stay.   You will be asked to remove any contacts, glasses, piercing's, hearing aid's, dentures/partials prior to surgery. Please bring cases for these items if needed.    Patients discharged the day of surgery will not be allowed to drive home, and someone needs to stay with them for 24 hours.  SURGICAL WAITING ROOM VISITATION Patients may have no more than 2 support people in the waiting area - these visitors may rotate.   Pre-op nurse will coordinate an appropriate time for 1 ADULT support person, who may  not rotate, to accompany patient in pre-op.  Children under the age of 49 must have an adult with them who is not the patient and must remain in the main waiting area with an adult.  If the patient needs to stay at the hospital during part of their recovery, the visitor guidelines for inpatient rooms apply.  Please refer to the [facility] website for the visitor guidelines for any additional information.   If you received a COVID test during your pre-op visit  it is requested that you wear a mask when out in public, stay away from anyone that may not be feeling well and notify your surgeon if you develop symptoms. If you have been in contact with anyone that has tested positive in the last 10 days please notify you surgeon.      Pre-operative 5 CHG Bathing Instructions   You can play a key role in reducing the risk of infection after surgery. Your skin needs to be as free of germs as possible. You can reduce the number of germs on your skin by washing with CHG (chlorhexidine gluconate) soap before surgery. CHG is an antiseptic soap that kills germs and continues to kill germs even after washing.   DO NOT use if you have an allergy to chlorhexidine/CHG or antibacterial soaps. If your skin becomes reddened or irritated, stop using the CHG and notify one of our RNs at [phone].   Please shower with the CHG soap starting 4 days before surgery using the following schedule:  Please keep in mind the following:  DO NOT shave, including legs and underarms, starting the day of your first shower.   You may shave your face at any point before/day of surgery.  Place clean sheets on your bed the day you start using CHG soap. Use a clean washcloth (not used since being washed) for each shower. DO NOT sleep with pets once you start using the CHG.   CHG Shower Instructions:  Wash your face and private area with normal soap. If you choose to wash your hair, wash first with your normal shampoo.   After you use shampoo/soap, rinse your hair and body thoroughly to remove shampoo/soap residue.  Turn the water OFF and apply about 3 tablespoons (45 ml) of CHG soap to a CLEAN washcloth.  Apply CHG soap ONLY FROM YOUR NECK DOWN TO YOUR TOES (washing for 3-5 minutes)  DO NOT use CHG soap on face, private areas, open wounds, or sores.  Pay special attention to the area where your surgery is being performed.  If you are having back surgery, having someone wash your back for you may be helpful. Wait 2 minutes after CHG soap is applied, then you may rinse off the CHG soap.  Pat dry with a clean towel  Put on clean clothes/pajamas   If you choose to wear lotion, please use ONLY the CHG-compatible lotions on the back of this paper.   Additional instructions for the day of surgery: DO NOT APPLY any lotions, deodorants, cologne, or perfumes.   Do not bring valuables to the hospital. [facility] is not responsible for any belongings/valuables. Do not wear nail polish, gel polish, artificial nails, or any other type of covering on natural nails (fingers and toes) Do not wear jewelry or makeup Put on clean/comfortable clothes.  Please brush your teeth.  Ask your nurse before applying any prescription medications to the skin.     CHG Compatible Lotions   Aveeno Moisturizing lotion  Cetaphil Moisturizing Cream  Cetaphil Moisturizing Lotion  Clairol Herbal Essence Moisturizing Lotion, Dry Skin  Clairol Herbal Essence Moisturizing Lotion, Extra Dry Skin  Clairol Herbal Essence Moisturizing Lotion, Normal Skin  Curel Age Defying Therapeutic Moisturizing Lotion with Alpha Hydroxy  Curel Extreme Care Body Lotion  Curel Soothing Hands Moisturizing Hand Lotion  Curel Therapeutic Moisturizing Cream, Fragrance-Free  Curel Therapeutic Moisturizing Lotion, Fragrance-Free  Curel Therapeutic Moisturizing Lotion, Original Formula  Eucerin Daily Replenishing Lotion  Eucerin Dry Skin Therapy Plus Alpha  Hydroxy Crme  Eucerin Dry Skin Therapy Plus Alpha Hydroxy Lotion  Eucerin Original Crme  Eucerin Original Lotion  Eucerin Plus Crme Eucerin Plus Lotion  Eucerin TriLipid Replenishing Lotion  Keri Anti-Bacterial Hand Lotion  Keri Deep Conditioning Original Lotion Dry Skin Formula Softly Scented  Keri Deep Conditioning Original Lotion, Fragrance Free Sensitive Skin Formula  Keri Lotion Fast Absorbing Fragrance Free Sensitive Skin Formula  Keri Lotion Fast Absorbing Softly Scented Dry Skin Formula  Keri Original Lotion  Keri Skin Renewal Lotion Keri Silky Smooth Lotion  Keri Silky Smooth Sensitive Skin Lotion  Nivea Body Creamy Conditioning Oil  Nivea Body Extra Enriched Lotion  Nivea Body Original Lotion  Nivea Body Sheer Moisturizing Lotion Nivea Crme  Nivea Skin Firming Lotion  NutraDerm 30 Skin Lotion  NutraDerm Skin Lotion  NutraDerm Therapeutic Skin Cream  NutraDerm Therapeutic Skin Lotion  ProShield Protective Hand Cream  Provon moisturizing lotion  Please read over the following fact sheets that you were given.

## 2022-12-30 ENCOUNTER — Encounter (HOSPITAL_COMMUNITY): Payer: Self-pay

## 2022-12-30 ENCOUNTER — Other Ambulatory Visit: Payer: Self-pay

## 2022-12-30 ENCOUNTER — Encounter (HOSPITAL_COMMUNITY)
Admission: RE | Admit: 2022-12-30 | Discharge: 2022-12-30 | Disposition: A | Discharge status: Home / Self Care | Payer: Medicare HMO | Source: Ambulatory Visit | Attending: Neurological Surgery | Admitting: Neurological Surgery

## 2022-12-30 VITALS — BP 117/58 | HR 60 | Temp 97.8°F | Resp 18 | Ht 62.0 in | Wt 128.3 lb

## 2022-12-30 DIAGNOSIS — I4891 Unspecified atrial fibrillation: Secondary | ICD-10-CM | POA: Insufficient documentation

## 2022-12-30 DIAGNOSIS — I10 Essential (primary) hypertension: Secondary | ICD-10-CM | POA: Diagnosis not present

## 2022-12-30 DIAGNOSIS — I700 Atherosclerosis of aorta: Secondary | ICD-10-CM

## 2022-12-30 DIAGNOSIS — F17200 Nicotine dependence, unspecified, uncomplicated: Secondary | ICD-10-CM | POA: Diagnosis not present

## 2022-12-30 DIAGNOSIS — J449 Chronic obstructive pulmonary disease, unspecified: Secondary | ICD-10-CM | POA: Insufficient documentation

## 2022-12-30 DIAGNOSIS — Z01812 Encounter for preprocedural laboratory examination: Secondary | ICD-10-CM | POA: Diagnosis not present

## 2022-12-30 DIAGNOSIS — I350 Nonrheumatic aortic (valve) stenosis: Secondary | ICD-10-CM | POA: Diagnosis not present

## 2022-12-30 DIAGNOSIS — Z01818 Encounter for other preprocedural examination: Secondary | ICD-10-CM

## 2022-12-30 HISTORY — DX: Personal history of other diseases of the digestive system: Z87.19

## 2022-12-30 HISTORY — DX: Gastro-esophageal reflux disease without esophagitis: K21.9

## 2022-12-30 LAB — CBC
HCT: 40.7 % (ref 36.0–46.0)
Hemoglobin: 13.3 g/dL (ref 12.0–15.0)
MCH: 31.8 pg (ref 26.0–34.0)
MCHC: 32.7 g/dL (ref 30.0–36.0)
MCV: 97.4 fL (ref 80.0–100.0)
Platelets: 246 10*3/uL (ref 150–400)
RBC: 4.18 MIL/uL (ref 3.87–5.11)
RDW: 14.1 % (ref 11.5–15.5)
WBC: 9.4 10*3/uL (ref 4.0–10.5)
nRBC: 0 % (ref 0.0–0.2)

## 2022-12-30 LAB — BASIC METABOLIC PANEL
Anion gap: 7 (ref 5–15)
BUN: 13 mg/dL (ref 8–23)
CO2: 29 mmol/L (ref 22–32)
Calcium: 9.8 mg/dL (ref 8.9–10.3)
Chloride: 101 mmol/L (ref 98–111)
Creatinine, Ser: 1.18 mg/dL — ABNORMAL HIGH (ref 0.44–1.00)
GFR, Estimated: 48 mL/min — ABNORMAL LOW (ref >60)
Glucose, Bld: 98 mg/dL (ref 70–99)
Potassium: 4.1 mmol/L (ref 3.5–5.1)
Sodium: 137 mmol/L (ref 135–145)

## 2022-12-30 LAB — TYPE AND SCREEN
ABO/RH(D): O POS
Antibody Screen: NEGATIVE

## 2022-12-30 LAB — SURGICAL PCR SCREEN
MRSA, PCR: NEGATIVE
Staphylococcus aureus: NEGATIVE

## 2022-12-30 NOTE — Progress Notes (Addendum)
PCP - Marlan Palau Cardiologist - Christell Constant   PPM/ICD - denies  Chest x-ray -  EKG - 10/28/22 Stress Test - denies ECHO - 12/05/20 Cardiac Cath - denies  Sleep Study - denies  7 days prior to surgery STOP taking any Aspirin (unless otherwise instructed by your surgeon), Aleve, Naproxen, Ibuprofen, Motrin, Advil, Goody's, BC's, all herbal medications, fish oil, and all vitamins.   ERAS Protcol -no  COVID TEST- not needed   Anesthesia review: yes, cardiac clearance in EPIC.   Patient denies shortness of breath, fever, cough and chest pain at PAT appointment   All instructions explained to the patient, with a verbal understanding of the material. Patient agrees to go over the instructions while at home for a better understanding. Patient also instructed to self quarantine after being tested for COVID-19. The opportunity to ask questions was provided.

## 2022-12-31 ENCOUNTER — Other Ambulatory Visit (HOSPITAL_COMMUNITY): Payer: Self-pay

## 2022-12-31 ENCOUNTER — Telehealth: Payer: Self-pay

## 2022-12-31 IMAGING — CR DG CHEST 2V
2 series · 2 of 2 positions shown · non-contrast
Comparison: Radiograph 09/20/2016

CLINICAL DATA: Chest pain

EXAM:
CHEST - 2 VIEW

[w chest pa]
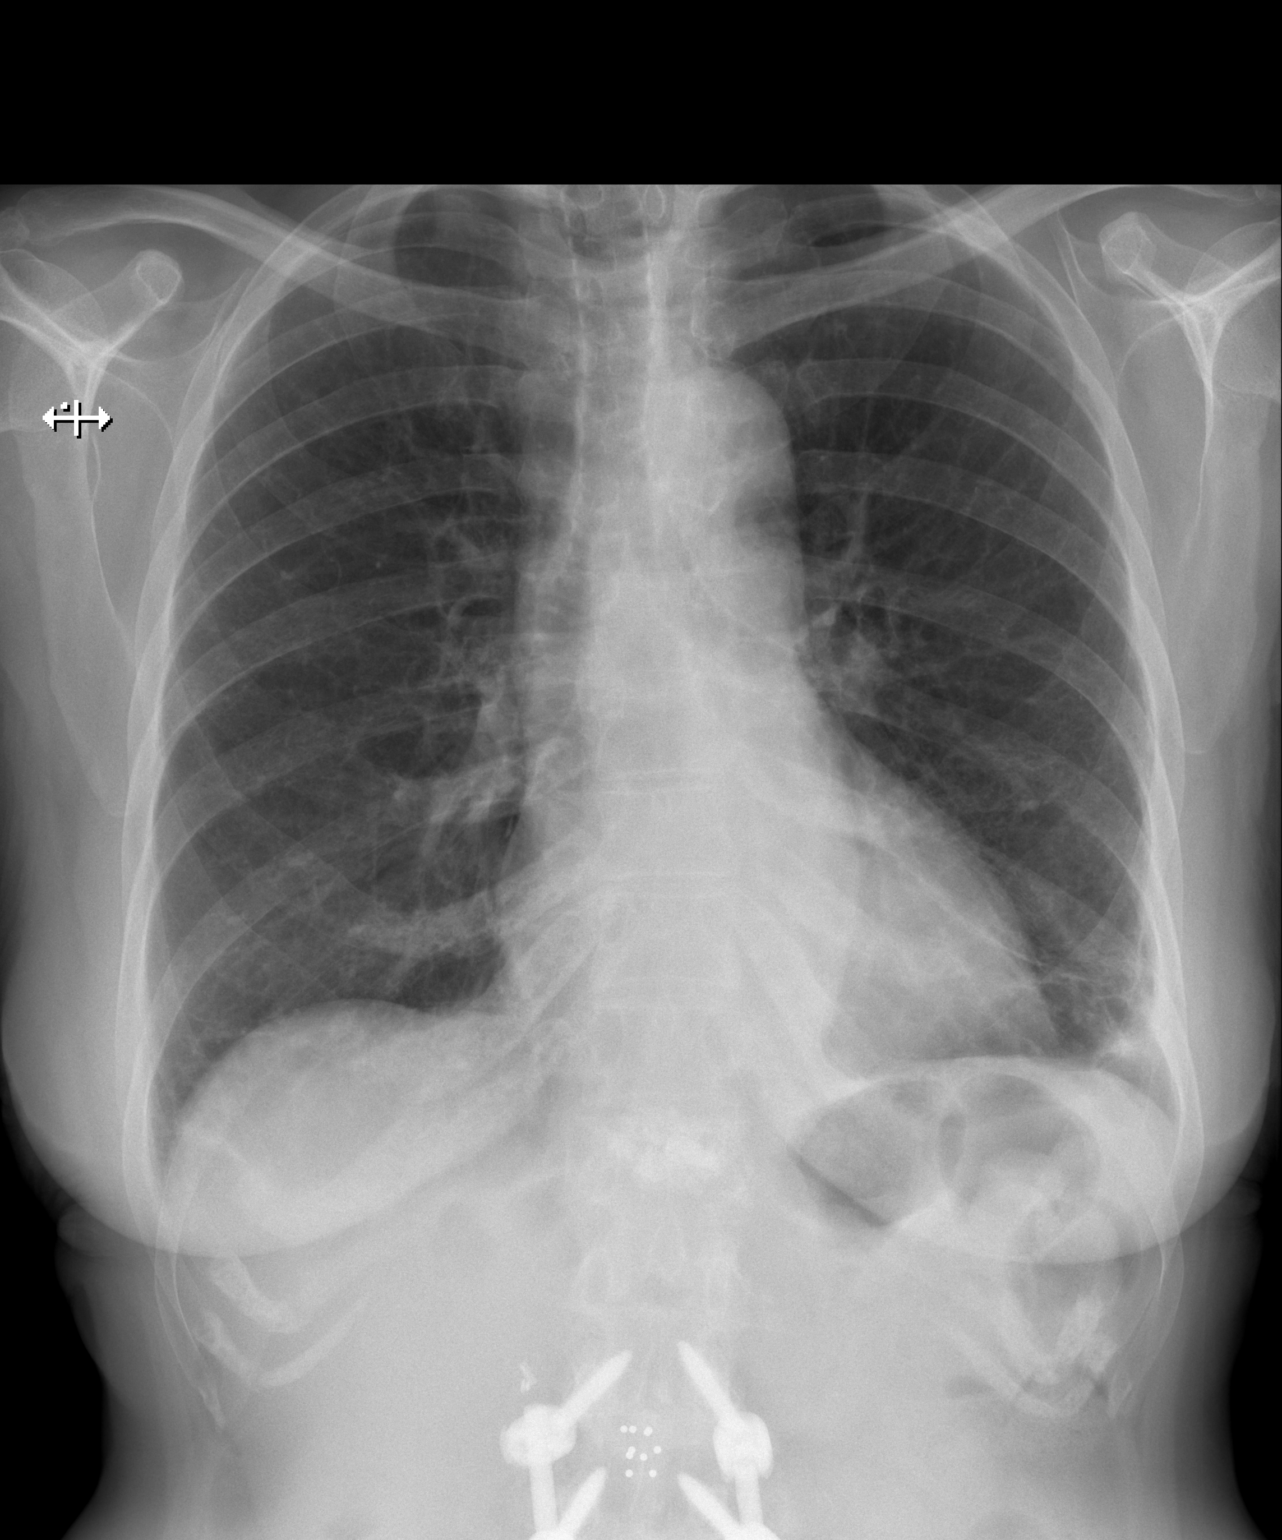

[w chest lat]
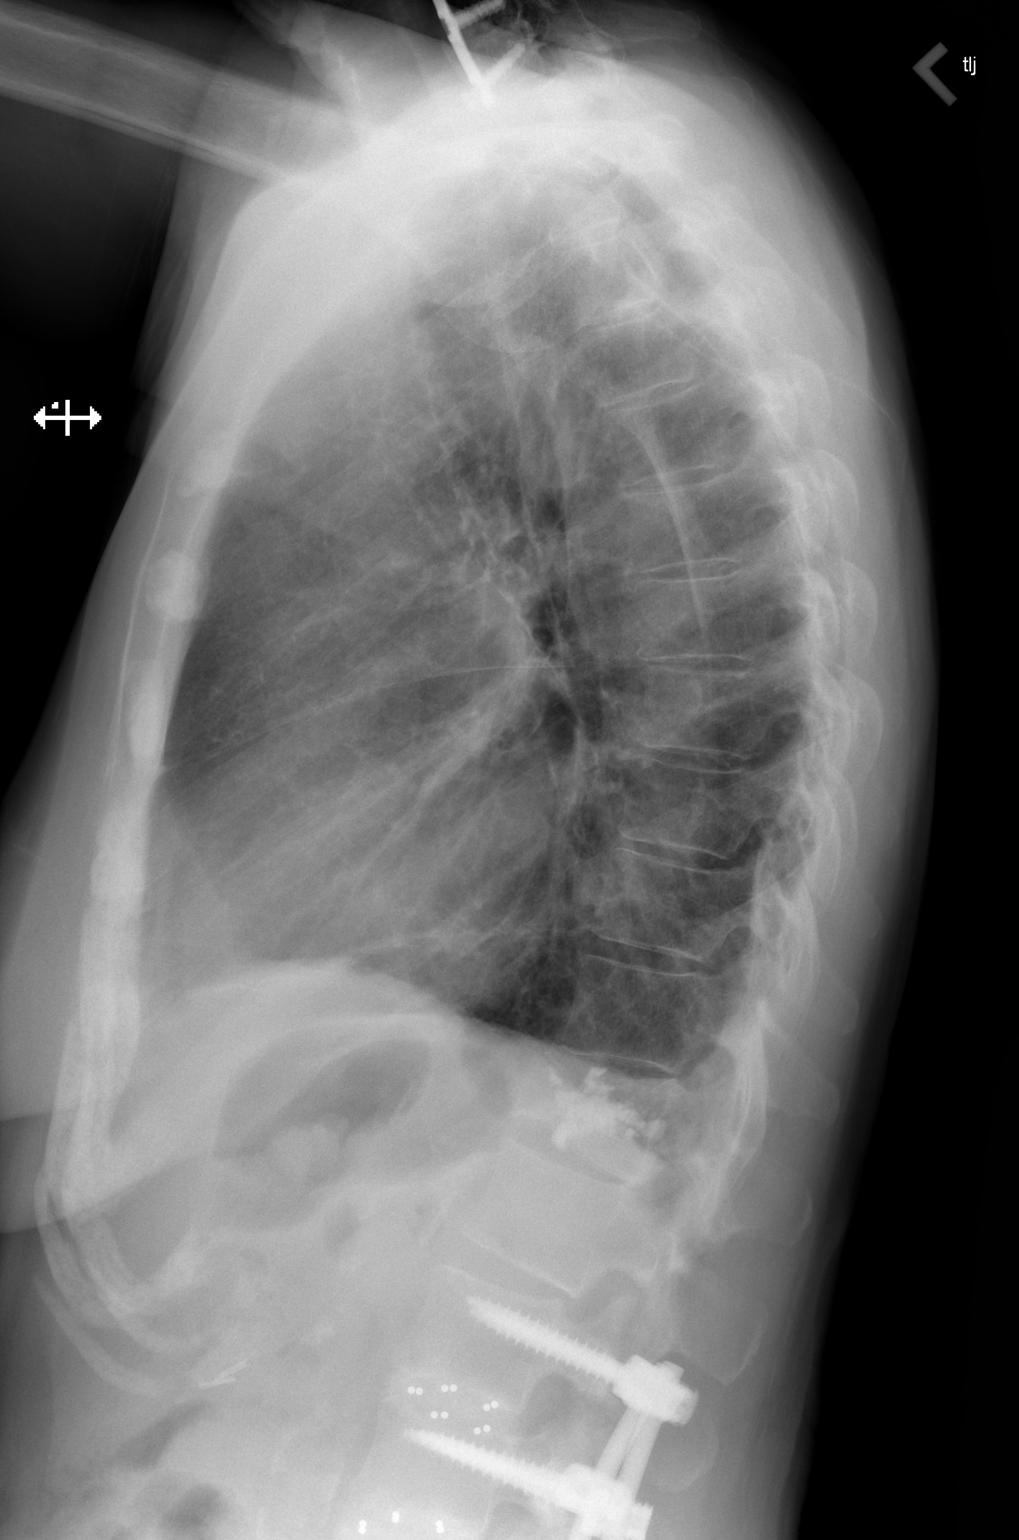

[2 of 2 positions shown; findings below may reference images not displayed]

FINDINGS: Unchanged cardiomediastinal silhouette. There is irregular opacity
in the left peripheral lung base. No airspace disease in the right
lung. No large pleural effusion or visible pneumothorax. No acute
osseous abnormality. Partially visualized cervical spine and lumbar
spine fusion hardware. Prior vertebroplasty of T12.
IMPRESSION: Irregular opacity in the peripheral left lung base, favored to be
atelectasis and/or scarring, new from prior exam in 9742. Recommend
follow-up PA and lateral chest radiograph in 6 weeks.

## 2022-12-31 NOTE — Anesthesia Preprocedure Evaluation (Signed)
Anesthesia Evaluation  Patient identified by MRN, date of birth, ID band Patient awake    Reviewed: Patient's Chart, lab work & pertinent test results  Airway Mallampati: II  TM Distance: >3 FB Neck ROM: Full    Dental no notable dental hx.    Pulmonary COPD, Current Smoker and Patient abstained from smoking.   Pulmonary exam normal        Cardiovascular hypertension, Pt. on medications and Pt. on home beta blockers + dysrhythmias Atrial Fibrillation  Rhythm:Regular Rate:Normal     Neuro/Psych  Headaches  Anxiety Depression       GI/Hepatic Neg liver ROS, hiatal hernia,GERD  Medicated,,  Endo/Other  Hypothyroidism    Renal/GU negative Renal ROS  negative genitourinary   Musculoskeletal  (+) Arthritis , Osteoarthritis,    Abdominal Normal abdominal exam  (+)   Peds  Hematology Lab Results      Component                Value               Date                      WBC                      9.4                 12/30/2022                HGB                      13.3                12/30/2022                HCT                      40.7                12/30/2022                MCV                      97.4                12/30/2022                PLT                      246                 12/30/2022             Lab Results      Component                Value               Date                      NA                       137                 12/30/2022                K  4.1                 12/30/2022                CO2                      29                  12/30/2022                GLUCOSE                  98                  12/30/2022                BUN                      13                  12/30/2022                CREATININE               1.18 (H)            12/30/2022                CALCIUM                  9.8                 12/30/2022                EGFR                     51 (L)               10/21/2022                GFRNONAA                 48 (L)              12/30/2022              Anesthesia Other Findings   Reproductive/Obstetrics                             Anesthesia Physical Anesthesia Plan  ASA: 3  Anesthesia Plan: General   Post-op Pain Management: Tylenol PO (pre-op)*   Induction: Intravenous  PONV Risk Score and Plan: 2 and Ondansetron, Dexamethasone and Treatment may vary due to age or medical condition  Airway Management Planned: Mask and Oral ETT  Additional Equipment: None  Intra-op Plan:   Post-operative Plan: Extubation in OR  Informed Consent: I have reviewed the patients History and Physical, chart, labs and discussed the procedure including the risks, benefits and alternatives for the proposed anesthesia with the patient or authorized representative who has indicated his/her understanding and acceptance.     Dental advisory given  Plan Discussed with: CRNA  Anesthesia Plan Comments: (PAT note by Megan Poles, PA-C: 77 year old female follows with cardiology for history of HTN, pericardial effusion and reactive atrial fibrillation.  She was seen in August 2022 with new pericardial effusion and prodrome consistent with myopericarditis and reactive atrial fibrillation.  She converted back to sinus rhythm and had interval echo with resolution  of effusion.  No known recurrence of atrial fibrillation.  Cardiac clearance per progress note 11/10/2022 by Edd Fabian, NP, "Chart reviewed as part of pre-operative protocol coverage. Given past medical history and time since last visit, based on ACC/AHA guidelines, [name] would be at acceptable risk for the planned procedure without further cardiovascular testing. Patient was advised that if she develops new symptoms prior to surgery to contact our office to arrange a follow-up appointment.  He verbalized understanding. Her RCRI is a class I risk, 0.4% risk of major  cardiac event.  She is able to complete greater than 4 METS of physical activity."  Current smoker with associated COPD, not maintained on any daily inhaled medications.  History of GERD and hiatal hernia.  Preop labs reviewed, creatinine mildly elevated 1.18, otherwise unremarkable.  EKG 10/28/2022: Sinus bradycardia.  Rate 59.  TTE 12/05/2020: 1. Left ventricular ejection fraction, by estimation, is 60 to 65%. The  left ventricle has normal function. The left ventricle has no regional  wall motion abnormalities.  2. Right ventricular systolic function is normal. The right ventricular  size is normal.  3. The mitral valve is normal in structure. Trivial mitral valve  regurgitation. No evidence of mitral stenosis.  4. The aortic valve is normal in structure. Aortic valve regurgitation is  trivial. Mild aortic valve stenosis.  5. The inferior vena cava is normal in size with greater than 50%  respiratory variability, suggesting right atrial pressure of 3 mmHg.   Comparison(s): Prior images reviewed side by side. The pericardial  effusion has resolved.    )        Anesthesia Quick Evaluation

## 2022-12-31 NOTE — Telephone Encounter (Signed)
Pt ready for scheduling for Prolia on or after : 01/15/23  Out-of-pocket cost due at time of visit: $327  Primary: [employer] - [employer] Prolia co-insurance: 20% Admin fee co-insurance: 20%  Secondary: N/A Prolia co-insurance:  Admin fee co-insurance:   Medical Benefit Details: Date Benefits were checked: 12/15/22 Deductible: no/ Coinsurance: 20%/ Admin Fee: 20%  Prior Auth: Approved PA# [id number] Expiration Date: 05/20/21 to 03/01/23  # of doses approved:  Pharmacy benefit: Copay $419.73 If patient wants fill through the pharmacy benefit please send prescription to: [facility], and include estimated need by date in rx notes. Pharmacy will ship medication directly to the office.  Patient not eligible for Prolia Copay Card. Copay Card can make patient's cost as little as $25. Link to apply: [email or web address]  ** This summary of benefits is an estimation of the patient's out-of-pocket cost. Exact cost may very based on individual plan coverage.

## 2022-12-31 NOTE — Progress Notes (Signed)
Anesthesia Chart Review:  77 year old female follows with cardiology for history of HTN, pericardial effusion and reactive atrial fibrillation.  She was seen in August 2022 with new pericardial effusion and prodrome consistent with myopericarditis and reactive atrial fibrillation.  She converted back to sinus rhythm and had interval echo with resolution of effusion.  No known recurrence of atrial fibrillation.  Cardiac clearance per progress note 11/10/2022 by Edd Fabian, NP, "Chart reviewed as part of pre-operative protocol coverage. Given past medical history and time since last visit, based on ACC/AHA guidelines, [name] would be at acceptable risk for the planned procedure without further cardiovascular testing. Patient was advised that if she develops new symptoms prior to surgery to contact our office to arrange a follow-up appointment.  He verbalized understanding. Her RCRI is a class I risk, 0.4% risk of major cardiac event.  She is able to complete greater than 4 METS of physical activity."  Current smoker with associated COPD, not maintained on any daily inhaled medications.  History of GERD and hiatal hernia.  Preop labs reviewed, creatinine mildly elevated 1.18, otherwise unremarkable.  EKG 10/28/2022: Sinus bradycardia.  Rate 59.  TTE 12/05/2020:  1. Left ventricular ejection fraction, by estimation, is 60 to 65%. The  left ventricle has normal function. The left ventricle has no regional  wall motion abnormalities.   2. Right ventricular systolic function is normal. The right ventricular  size is normal.   3. The mitral valve is normal in structure. Trivial mitral valve  regurgitation. No evidence of mitral stenosis.   4. The aortic valve is normal in structure. Aortic valve regurgitation is  trivial. Mild aortic valve stenosis.   5. The inferior vena cava is normal in size with greater than 50%  respiratory variability, suggesting right atrial pressure of 3 mmHg.    Comparison(s): Prior images reviewed side by side. The pericardial  effusion has resolved.     Zannie Cove [facility]/Anesthesiology Phone [phone] 12/31/2022 10:42 AM

## 2023-01-03 ENCOUNTER — Ambulatory Visit (HOSPITAL_COMMUNITY): Payer: Medicare HMO | Admitting: Physician Assistant

## 2023-01-03 ENCOUNTER — Other Ambulatory Visit: Payer: Self-pay

## 2023-01-03 ENCOUNTER — Encounter (HOSPITAL_COMMUNITY): Payer: Self-pay | Admitting: Neurological Surgery

## 2023-01-03 ENCOUNTER — Ambulatory Visit (HOSPITAL_COMMUNITY): Payer: Medicare HMO

## 2023-01-03 ENCOUNTER — Observation Stay (HOSPITAL_COMMUNITY)
Admission: RE | Admit: 2023-01-03 | Discharge: 2023-01-05 | Disposition: A | Discharge status: Home Health | Payer: Medicare HMO | Attending: Neurological Surgery | Admitting: Neurological Surgery

## 2023-01-03 ENCOUNTER — Ambulatory Visit (HOSPITAL_BASED_OUTPATIENT_CLINIC_OR_DEPARTMENT_OTHER): Payer: Medicare HMO | Admitting: Anesthesiology

## 2023-01-03 ENCOUNTER — Encounter (HOSPITAL_COMMUNITY)
Admission: RE | Disposition: A | Discharge status: Home Health | Payer: Self-pay | Source: Home / Self Care | Attending: Neurological Surgery

## 2023-01-03 DIAGNOSIS — Z7982 Long term (current) use of aspirin: Secondary | ICD-10-CM | POA: Diagnosis not present

## 2023-01-03 DIAGNOSIS — M4854XA Collapsed vertebra, not elsewhere classified, thoracic region, initial encounter for fracture: Secondary | ICD-10-CM | POA: Diagnosis not present

## 2023-01-03 DIAGNOSIS — I10 Essential (primary) hypertension: Secondary | ICD-10-CM | POA: Insufficient documentation

## 2023-01-03 DIAGNOSIS — F17210 Nicotine dependence, cigarettes, uncomplicated: Secondary | ICD-10-CM | POA: Diagnosis not present

## 2023-01-03 DIAGNOSIS — E039 Hypothyroidism, unspecified: Secondary | ICD-10-CM | POA: Diagnosis not present

## 2023-01-03 DIAGNOSIS — M5116 Intervertebral disc disorders with radiculopathy, lumbar region: Secondary | ICD-10-CM | POA: Diagnosis not present

## 2023-01-03 DIAGNOSIS — J449 Chronic obstructive pulmonary disease, unspecified: Secondary | ICD-10-CM | POA: Diagnosis not present

## 2023-01-03 DIAGNOSIS — Z79899 Other long term (current) drug therapy: Secondary | ICD-10-CM | POA: Diagnosis not present

## 2023-01-03 DIAGNOSIS — M4726 Other spondylosis with radiculopathy, lumbar region: Principal | ICD-10-CM | POA: Insufficient documentation

## 2023-01-03 DIAGNOSIS — Z87891 Personal history of nicotine dependence: Principal | ICD-10-CM

## 2023-01-03 DIAGNOSIS — M48062 Spinal stenosis, lumbar region with neurogenic claudication: Secondary | ICD-10-CM | POA: Diagnosis not present

## 2023-01-03 DIAGNOSIS — I4891 Unspecified atrial fibrillation: Secondary | ICD-10-CM | POA: Insufficient documentation

## 2023-01-03 DIAGNOSIS — Z981 Arthrodesis status: Secondary | ICD-10-CM | POA: Diagnosis not present

## 2023-01-03 DIAGNOSIS — Z91040 Latex allergy status: Secondary | ICD-10-CM | POA: Diagnosis not present

## 2023-01-03 DIAGNOSIS — M48061 Spinal stenosis, lumbar region without neurogenic claudication: Principal | ICD-10-CM | POA: Diagnosis present

## 2023-01-03 DIAGNOSIS — F418 Other specified anxiety disorders: Secondary | ICD-10-CM | POA: Diagnosis not present

## 2023-01-03 SURGERY — POSTERIOR LUMBAR FUSION 1 LEVEL
Anesthesia: General | Site: Back

## 2023-01-03 MED ORDER — PROPOFOL 1000 MG/100ML IV EMUL
INTRAVENOUS | Status: AC
  Filled 2023-01-03: qty 100

## 2023-01-03 MED ORDER — CHLORHEXIDINE GLUCONATE CLOTH 2 % EX PADS: 6.0000 | MEDICATED_PAD | Freq: Once | CUTANEOUS | Status: DC

## 2023-01-03 MED ORDER — MENTHOL 3 MG MT LOZG: 1.0000 | LOZENGE | OROMUCOSAL | Status: DC | PRN

## 2023-01-03 MED ORDER — LACTATED RINGERS IV SOLN: INTRAVENOUS | Status: DC

## 2023-01-03 MED ORDER — TRAZODONE HCL 50 MG PO TABS
200.0000 mg | ORAL_TABLET | Freq: Every day | ORAL | Status: DC
  Administered 2023-01-04: 200 mg via ORAL
  Filled 2023-01-03: qty 1

## 2023-01-03 MED ORDER — LORATADINE 10 MG PO TABS
10.0000 mg | ORAL_TABLET | Freq: Every day | ORAL | Status: DC
  Administered 2023-01-04 – 2023-01-05 (×2): 10 mg via ORAL
  Filled 2023-01-03 (×2): qty 1

## 2023-01-03 MED ORDER — LEVOTHYROXINE SODIUM 88 MCG PO TABS
88.0000 ug | ORAL_TABLET | Freq: Every day | ORAL | Status: DC
  Administered 2023-01-04 – 2023-01-05 (×2): 88 ug via ORAL
  Filled 2023-01-03 (×2): qty 1

## 2023-01-03 MED ORDER — THROMBIN 5000 UNITS EX SOLR
CUTANEOUS | Status: AC
  Filled 2023-01-03: qty 5000

## 2023-01-03 MED ORDER — PROPRANOLOL HCL 10 MG PO TABS
40.0000 mg | ORAL_TABLET | Freq: Two times a day (BID) | ORAL | Status: DC
  Administered 2023-01-03 – 2023-01-05 (×3): 40 mg via ORAL
  Filled 2023-01-03 (×2): qty 4

## 2023-01-03 MED ORDER — 0.9 % SODIUM CHLORIDE (POUR BTL) OPTIME
TOPICAL | Status: DC | PRN
  Administered 2023-01-03: 1000 mL

## 2023-01-03 MED ORDER — DEXAMETHASONE SODIUM PHOSPHATE 10 MG/ML IJ SOLN
INTRAMUSCULAR | Status: DC | PRN
  Administered 2023-01-03: 10 mg via INTRAVENOUS

## 2023-01-03 MED ORDER — HYDROMORPHONE HCL 1 MG/ML IJ SOLN
0.5000 mg | INTRAMUSCULAR | Status: DC | PRN
  Administered 2023-01-03 – 2023-01-04 (×2): 0.5 mg via INTRAVENOUS
  Filled 2023-01-03 (×2): qty 0.5

## 2023-01-03 MED ORDER — MIDAZOLAM HCL 2 MG/2ML IJ SOLN
INTRAMUSCULAR | Status: DC | PRN
  Administered 2023-01-03: 1 mg via INTRAVENOUS

## 2023-01-03 MED ORDER — ROCURONIUM BROMIDE 10 MG/ML (PF) SYRINGE
PREFILLED_SYRINGE | INTRAVENOUS | Status: DC | PRN
  Administered 2023-01-03: 10 mg via INTRAVENOUS
  Administered 2023-01-03: 60 mg via INTRAVENOUS
  Administered 2023-01-03: 10 mg via INTRAVENOUS

## 2023-01-03 MED ORDER — ONDANSETRON HCL 4 MG PO TABS
4.0000 mg | ORAL_TABLET | Freq: Four times a day (QID) | ORAL | Status: DC | PRN
Start: 2023-01-03 — End: 2023-01-06

## 2023-01-03 MED ORDER — METHOCARBAMOL 500 MG PO TABS
500.0000 mg | ORAL_TABLET | Freq: Four times a day (QID) | ORAL | Status: DC | PRN
  Administered 2023-01-03 – 2023-01-05 (×3): 500 mg via ORAL
  Filled 2023-01-03 (×3): qty 1

## 2023-01-03 MED ORDER — KETAMINE HCL 50 MG/5ML IJ SOSY
PREFILLED_SYRINGE | INTRAMUSCULAR | Status: AC
  Filled 2023-01-03: qty 5

## 2023-01-03 MED ORDER — CHLORHEXIDINE GLUCONATE 0.12 % MT SOLN
15.0000 mL | Freq: Once | OROMUCOSAL | Status: AC
  Administered 2023-01-03: 15 mL via OROMUCOSAL
  Filled 2023-01-03: qty 15

## 2023-01-03 MED ORDER — VALACYCLOVIR HCL 500 MG PO TABS: 500.0000 mg | ORAL_TABLET | Freq: Two times a day (BID) | ORAL | Status: DC | PRN

## 2023-01-03 MED ORDER — POLYETHYLENE GLYCOL 3350 17 G PO PACK: 17.0000 g | PACK | Freq: Every day | ORAL | Status: DC | PRN

## 2023-01-03 MED ORDER — VANCOMYCIN HCL 500 MG/100ML IV SOLN
500.0000 mg | Freq: Once | INTRAVENOUS | Status: AC
  Administered 2023-01-04: 500 mg via INTRAVENOUS
  Filled 2023-01-03: qty 100

## 2023-01-03 MED ORDER — FENTANYL CITRATE (PF) 250 MCG/5ML IJ SOLN
INTRAMUSCULAR | Status: AC
  Filled 2023-01-03: qty 5

## 2023-01-03 MED ORDER — SUGAMMADEX SODIUM 200 MG/2ML IV SOLN
INTRAVENOUS | Status: DC | PRN
  Administered 2023-01-03: 200 mg via INTRAVENOUS

## 2023-01-03 MED ORDER — METHOCARBAMOL 1000 MG/10ML IJ SOLN: 500.0000 mg | Freq: Four times a day (QID) | INTRAMUSCULAR | Status: DC | PRN

## 2023-01-03 MED ORDER — OXYCODONE-ACETAMINOPHEN 5-325 MG PO TABS
1.0000 | ORAL_TABLET | Freq: Four times a day (QID) | ORAL | Status: DC | PRN
Start: 2023-01-03 — End: 2023-01-06
  Administered 2023-01-03: 1 via ORAL
  Administered 2023-01-04 (×2): 2 via ORAL
  Administered 2023-01-04: 1 via ORAL
  Administered 2023-01-04 – 2023-01-05 (×3): 2 via ORAL
  Filled 2023-01-03: qty 2
  Filled 2023-01-03: qty 1
  Filled 2023-01-03 (×5): qty 2

## 2023-01-03 MED ORDER — FENOFIBRATE 160 MG PO TABS
160.0000 mg | ORAL_TABLET | Freq: Every day | ORAL | Status: DC
  Administered 2023-01-03 – 2023-01-05 (×3): 160 mg via ORAL
  Filled 2023-01-03 (×3): qty 1

## 2023-01-03 MED ORDER — PANTOPRAZOLE SODIUM 40 MG PO TBEC
40.0000 mg | DELAYED_RELEASE_TABLET | Freq: Every day | ORAL | Status: DC
  Administered 2023-01-03 – 2023-01-04 (×2): 40 mg via ORAL
  Filled 2023-01-03 (×2): qty 1

## 2023-01-03 MED ORDER — OLOPATADINE HCL 0.1 % OP SOLN
1.0000 [drp] | Freq: Two times a day (BID) | OPHTHALMIC | Status: DC | PRN
  Administered 2023-01-03 – 2023-01-04 (×2): 1 [drp] via OPHTHALMIC
  Filled 2023-01-03: qty 5

## 2023-01-03 MED ORDER — SODIUM CHLORIDE 0.9% FLUSH: 3.0000 mL | INTRAVENOUS | Status: DC | PRN

## 2023-01-03 MED ORDER — ROSUVASTATIN CALCIUM 5 MG PO TABS
5.0000 mg | ORAL_TABLET | Freq: Every day | ORAL | Status: DC
  Administered 2023-01-03 – 2023-01-04 (×2): 5 mg via ORAL
  Filled 2023-01-03 (×2): qty 1

## 2023-01-03 MED ORDER — FENTANYL CITRATE (PF) 250 MCG/5ML IJ SOLN
INTRAMUSCULAR | Status: DC | PRN
  Administered 2023-01-03 (×4): 50 ug via INTRAVENOUS

## 2023-01-03 MED ORDER — PHENYLEPHRINE 80 MCG/ML (10ML) SYRINGE FOR IV PUSH (FOR BLOOD PRESSURE SUPPORT)
PREFILLED_SYRINGE | INTRAVENOUS | Status: DC | PRN
  Administered 2023-01-03: 160 ug via INTRAVENOUS

## 2023-01-03 MED ORDER — FENTANYL CITRATE (PF) 100 MCG/2ML IJ SOLN
INTRAMUSCULAR | Status: AC
  Filled 2023-01-03: qty 2

## 2023-01-03 MED ORDER — SODIUM CHLORIDE 0.9% FLUSH
3.0000 mL | Freq: Two times a day (BID) | INTRAVENOUS | Status: DC
  Administered 2023-01-03 – 2023-01-04 (×2): 3 mL via INTRAVENOUS

## 2023-01-03 MED ORDER — MELATONIN 5 MG PO TABS
10.0000 mg | ORAL_TABLET | Freq: Every day | ORAL | Status: DC
  Administered 2023-01-04: 5 mg via ORAL
  Filled 2023-01-03 (×2): qty 2

## 2023-01-03 MED ORDER — ROCURONIUM BROMIDE 10 MG/ML (PF) SYRINGE
PREFILLED_SYRINGE | INTRAVENOUS | Status: AC
  Filled 2023-01-03: qty 10

## 2023-01-03 MED ORDER — FENTANYL CITRATE (PF) 100 MCG/2ML IJ SOLN
25.0000 ug | INTRAMUSCULAR | Status: DC | PRN
  Administered 2023-01-03 (×2): 25 ug via INTRAVENOUS

## 2023-01-03 MED ORDER — ALUM & MAG HYDROXIDE-SIMETH 200-200-20 MG/5ML PO SUSP: 30.0000 mL | Freq: Four times a day (QID) | ORAL | Status: DC | PRN

## 2023-01-03 MED ORDER — DEXAMETHASONE SODIUM PHOSPHATE 10 MG/ML IJ SOLN
INTRAMUSCULAR | Status: AC
  Filled 2023-01-03: qty 1

## 2023-01-03 MED ORDER — BUPIVACAINE HCL (PF) 0.5 % IJ SOLN
INTRAMUSCULAR | Status: AC
  Filled 2023-01-03: qty 30

## 2023-01-03 MED ORDER — ONDANSETRON HCL 4 MG/2ML IJ SOLN
INTRAMUSCULAR | Status: DC | PRN
  Administered 2023-01-03: 4 mg via INTRAVENOUS

## 2023-01-03 MED ORDER — SENNA 8.6 MG PO TABS
1.0000 | ORAL_TABLET | Freq: Two times a day (BID) | ORAL | Status: DC
  Administered 2023-01-03 – 2023-01-05 (×4): 8.6 mg via ORAL
  Filled 2023-01-03 (×4): qty 1

## 2023-01-03 MED ORDER — LIDOCAINE 2% (20 MG/ML) 5 ML SYRINGE
INTRAMUSCULAR | Status: DC | PRN
  Administered 2023-01-03: 60 mg via INTRAVENOUS

## 2023-01-03 MED ORDER — EPHEDRINE SULFATE-NACL 50-0.9 MG/10ML-% IV SOSY
PREFILLED_SYRINGE | INTRAVENOUS | Status: DC | PRN
  Administered 2023-01-03: 7.5 mg via INTRAVENOUS
  Administered 2023-01-03: 5 mg via INTRAVENOUS

## 2023-01-03 MED ORDER — MIDAZOLAM HCL 2 MG/2ML IJ SOLN
INTRAMUSCULAR | Status: AC
  Filled 2023-01-03: qty 2

## 2023-01-03 MED ORDER — VANCOMYCIN HCL IN DEXTROSE 1-5 GM/200ML-% IV SOLN
1000.0000 mg | INTRAVENOUS | Status: AC
  Administered 2023-01-03: 1000 mg via INTRAVENOUS
  Filled 2023-01-03: qty 200

## 2023-01-03 MED ORDER — ESTRADIOL 0.5 MG PO TABS
1.0000 mg | ORAL_TABLET | Freq: Every day | ORAL | Status: DC
Start: 2023-01-04 — End: 2023-01-06
  Administered 2023-01-04 – 2023-01-05 (×2): 1 mg via ORAL
  Filled 2023-01-03 (×2): qty 2

## 2023-01-03 MED ORDER — LIDOCAINE-EPINEPHRINE 1 %-1:100000 IJ SOLN
INTRAMUSCULAR | Status: AC
  Filled 2023-01-03: qty 1

## 2023-01-03 MED ORDER — PREGABALIN 75 MG PO CAPS
75.0000 mg | ORAL_CAPSULE | Freq: Two times a day (BID) | ORAL | Status: DC
  Administered 2023-01-03 – 2023-01-05 (×4): 75 mg via ORAL
  Filled 2023-01-03 (×4): qty 1

## 2023-01-03 MED ORDER — LIDOCAINE-EPINEPHRINE 1 %-1:100000 IJ SOLN
INTRAMUSCULAR | Status: DC | PRN
  Administered 2023-01-03: 4 mL

## 2023-01-03 MED ORDER — ORAL CARE MOUTH RINSE: 15.0000 mL | Freq: Once | OROMUCOSAL | Status: AC

## 2023-01-03 MED ORDER — THROMBIN 5000 UNITS EX SOLR
OROMUCOSAL | Status: DC | PRN
  Administered 2023-01-03: 5 mL via TOPICAL

## 2023-01-03 MED ORDER — ONDANSETRON HCL 4 MG/2ML IJ SOLN
INTRAMUSCULAR | Status: AC
  Filled 2023-01-03: qty 2

## 2023-01-03 MED ORDER — BISACODYL 10 MG RE SUPP: 10.0000 mg | Freq: Every day | RECTAL | Status: DC | PRN

## 2023-01-03 MED ORDER — ACETAMINOPHEN 650 MG RE SUPP: 650.0000 mg | RECTAL | Status: DC | PRN

## 2023-01-03 MED ORDER — SODIUM CHLORIDE 0.9 % IV SOLN
250.0000 mL | INTRAVENOUS | Status: AC
  Administered 2023-01-03: 250 mL via INTRAVENOUS

## 2023-01-03 MED ORDER — LIDOCAINE 2% (20 MG/ML) 5 ML SYRINGE
INTRAMUSCULAR | Status: AC
  Filled 2023-01-03: qty 5

## 2023-01-03 MED ORDER — MIRABEGRON ER 25 MG PO TB24
25.0000 mg | ORAL_TABLET | Freq: Every day | ORAL | Status: DC
  Administered 2023-01-03 – 2023-01-05 (×3): 25 mg via ORAL
  Filled 2023-01-03 (×3): qty 1

## 2023-01-03 MED ORDER — VENLAFAXINE HCL ER 75 MG PO CP24
300.0000 mg | ORAL_CAPSULE | Freq: Every day | ORAL | Status: DC
  Administered 2023-01-03 – 2023-01-05 (×3): 300 mg via ORAL
  Filled 2023-01-03 (×3): qty 4

## 2023-01-03 MED ORDER — CYCLOSPORINE 0.05 % OP EMUL
1.0000 [drp] | Freq: Two times a day (BID) | OPHTHALMIC | Status: DC
  Administered 2023-01-03 – 2023-01-04 (×3): 1 [drp] via OPHTHALMIC
  Filled 2023-01-03 (×5): qty 30

## 2023-01-03 MED ORDER — PHENOL 1.4 % MT LIQD: 1.0000 | OROMUCOSAL | Status: DC | PRN

## 2023-01-03 MED ORDER — ROPINIROLE HCL 1 MG PO TABS
0.5000 mg | ORAL_TABLET | Freq: Every evening | ORAL | Status: DC | PRN
Start: 2023-01-03 — End: 2023-01-06

## 2023-01-03 MED ORDER — ACETAMINOPHEN 500 MG PO TABS
1000.0000 mg | ORAL_TABLET | Freq: Once | ORAL | Status: DC
Start: 2023-01-03 — End: 2023-01-03

## 2023-01-03 MED ORDER — PHENYLEPHRINE HCL-NACL 20-0.9 MG/250ML-% IV SOLN
INTRAVENOUS | Status: DC | PRN
  Administered 2023-01-03: 30 ug/min via INTRAVENOUS

## 2023-01-03 MED ORDER — KETAMINE HCL 10 MG/ML IJ SOLN
INTRAMUSCULAR | Status: DC | PRN
  Administered 2023-01-03: 10 mg via INTRAVENOUS
  Administered 2023-01-03: 20 mg via INTRAVENOUS
  Administered 2023-01-03: 10 mg via INTRAVENOUS

## 2023-01-03 MED ORDER — FLEET ENEMA RE ENEM: 1.0000 | ENEMA | Freq: Once | RECTAL | Status: DC | PRN

## 2023-01-03 MED ORDER — DOCUSATE SODIUM 100 MG PO CAPS
100.0000 mg | ORAL_CAPSULE | Freq: Two times a day (BID) | ORAL | Status: DC
  Administered 2023-01-03 – 2023-01-05 (×4): 100 mg via ORAL
  Filled 2023-01-03 (×4): qty 1

## 2023-01-03 MED ORDER — DIAZEPAM 5 MG PO TABS
5.0000 mg | ORAL_TABLET | Freq: Three times a day (TID) | ORAL | Status: DC | PRN
  Administered 2023-01-04: 5 mg via ORAL
  Filled 2023-01-03: qty 1

## 2023-01-03 MED ORDER — MEMANTINE HCL 10 MG PO TABS
10.0000 mg | ORAL_TABLET | Freq: Two times a day (BID) | ORAL | Status: DC
  Administered 2023-01-03 – 2023-01-05 (×4): 10 mg via ORAL
  Filled 2023-01-03 (×5): qty 1

## 2023-01-03 MED ORDER — FLUTICASONE PROPIONATE 50 MCG/ACT NA SUSP: 1.0000 | Freq: Every day | NASAL | Status: DC | PRN

## 2023-01-03 MED ORDER — PROPOFOL 10 MG/ML IV BOLUS
INTRAVENOUS | Status: DC | PRN
  Administered 2023-01-03: 60 ug/kg/min via INTRAVENOUS
  Administered 2023-01-03: 120 mg via INTRAVENOUS

## 2023-01-03 MED ORDER — BUPIVACAINE HCL (PF) 0.5 % IJ SOLN
INTRAMUSCULAR | Status: DC | PRN
  Administered 2023-01-03: 4 mL
  Administered 2023-01-03: 26 mL

## 2023-01-03 MED ORDER — ONDANSETRON HCL 4 MG/2ML IJ SOLN
4.0000 mg | Freq: Four times a day (QID) | INTRAMUSCULAR | Status: DC | PRN
Start: 2023-01-03 — End: 2023-01-06

## 2023-01-03 MED ORDER — ACETAMINOPHEN 325 MG PO TABS: 650.0000 mg | ORAL_TABLET | ORAL | Status: DC | PRN

## 2023-01-03 SURGICAL SUPPLY — 75 items
ADH SKN CLS APL DERMABOND .7 (GAUZE/BANDAGES/DRESSINGS) ×1
APL SRG 60D 8 XTD TIP BNDBL (TIP)
BAG COUNTER SPONGE SURGICOUNT (BAG) ×1 IMPLANT
BAG SPNG CNTER NS LX DISP (BAG) ×1
BASKET BONE COLLECTION (BASKET) ×1 IMPLANT
BLADE BONE MILL MEDIUM (MISCELLANEOUS) ×1 IMPLANT
BLADE CLIPPER SURG (BLADE) IMPLANT
BONE CANC CHIPS 20CC PCAN1/4 (Bone Implant) ×1 IMPLANT
BUR MATCHSTICK NEURO 3.0 LAGG (BURR) ×1 IMPLANT
CANISTER SUCT 3000ML PPV (MISCELLANEOUS) ×1 IMPLANT
CATH FOLEY LATEX FREE 16FR (CATHETERS) ×1
CATH FOLEY LF 16FR (CATHETERS) IMPLANT
CEMENT KYPHON CX01A KIT/MIXER (Cement) IMPLANT
CHIPS CANC BONE 20CC PCAN1/4 (Bone Implant) ×1 IMPLANT
CNTNR URN SCR LID CUP LEK RST (MISCELLANEOUS) ×1 IMPLANT
CONT SPEC 4OZ STRL OR WHT (MISCELLANEOUS) ×1
COVER BACK TABLE 60X90IN (DRAPES) ×1 IMPLANT
DERMABOND ADVANCED .7 DNX12 (GAUZE/BANDAGES/DRESSINGS) ×1 IMPLANT
DEVICE DISSECT PLASMABLAD 3.0S (MISCELLANEOUS) ×1 IMPLANT
DRAPE C-ARM 42X72 X-RAY (DRAPES) ×2 IMPLANT
DRAPE HALF SHEET 40X57 (DRAPES) IMPLANT
DRAPE LAPAROTOMY 100X72X124 (DRAPES) ×1 IMPLANT
DRSG OPSITE POSTOP 4X8 (GAUZE/BANDAGES/DRESSINGS) IMPLANT
DURAPREP 26ML APPLICATOR (WOUND CARE) ×1 IMPLANT
DURASEAL APPLICATOR TIP (TIP) IMPLANT
DURASEAL SPINE SEALANT 3ML (MISCELLANEOUS) IMPLANT
ELECT REM PT RETURN 9FT ADLT (ELECTROSURGICAL) ×1
ELECTRODE REM PT RTRN 9FT ADLT (ELECTROSURGICAL) ×1 IMPLANT
GAUZE 4X4 16PLY ~~LOC~~+RFID DBL (SPONGE) IMPLANT
GAUZE SPONGE 4X4 12PLY STRL (GAUZE/BANDAGES/DRESSINGS) ×1 IMPLANT
GLOVE BIOGEL PI IND STRL 8.5 (GLOVE) ×2 IMPLANT
GLOVE ECLIPSE 8.5 STRL (GLOVE) ×2 IMPLANT
GOWN STRL REUS W/ TWL LRG LVL3 (GOWN DISPOSABLE) IMPLANT
GOWN STRL REUS W/ TWL XL LVL3 (GOWN DISPOSABLE) IMPLANT
GOWN STRL REUS W/TWL 2XL LVL3 (GOWN DISPOSABLE) ×2 IMPLANT
GOWN STRL REUS W/TWL LRG LVL3 (GOWN DISPOSABLE)
GOWN STRL REUS W/TWL XL LVL3 (GOWN DISPOSABLE)
GRAFT BNE CANC CHIPS 1-8 20CC (Bone Implant) IMPLANT
GRAFT BONE PROTEIOS MED 2.5CC (Orthopedic Implant) IMPLANT
HEMOSTAT POWDER KIT SURGIFOAM (HEMOSTASIS) ×1 IMPLANT
KIT BASIN OR (CUSTOM PROCEDURE TRAY) ×1 IMPLANT
KIT GRAFTMAG DEL NEURO DISP (NEUROSURGERY SUPPLIES) IMPLANT
KIT TURNOVER KIT B (KITS) ×1 IMPLANT
MILL BONE PREP (MISCELLANEOUS) ×1 IMPLANT
NDL HYPO 22X1.5 SAFETY MO (MISCELLANEOUS) ×1 IMPLANT
NDL RELINE-OR FENS 16 (NEEDLE) IMPLANT
NEEDLE HYPO 22X1.5 SAFETY MO (MISCELLANEOUS) ×1 IMPLANT
NEEDLE RELINE-OR FENS 16 (NEEDLE) ×2 IMPLANT
NS IRRIG 1000ML POUR BTL (IV SOLUTION) ×1 IMPLANT
PACK LAMINECTOMY NEURO (CUSTOM PROCEDURE TRAY) ×1 IMPLANT
PAD ARMBOARD 7.5X6 YLW CONV (MISCELLANEOUS) ×3 IMPLANT
PATTIES SURGICAL .5 X1 (DISPOSABLE) ×1 IMPLANT
PLASMABLADE 3.0S (MISCELLANEOUS) ×1
PUSHER RELINE FENS 36 OR (MISCELLANEOUS) IMPLANT
ROD RELINE 0-0 CON M 5.0/6.0MM (Rod) IMPLANT
ROD RELINE-O LORDOTIC 5.5X60MM (Rod) IMPLANT
SCREW LOCK RELINE 5.5 TULIP (Screw) IMPLANT
SCREW RELINE 2FS POLY 6.5X45 (Screw) IMPLANT
SPACER TI SUSTAIN-RT 8X22X6 0D (Spacer) IMPLANT
SPIKE FLUID TRANSFER (MISCELLANEOUS) ×1 IMPLANT
SPONGE SURGIFOAM ABS GEL 100 (HEMOSTASIS) IMPLANT
SPONGE T-LAP 4X18 ~~LOC~~+RFID (SPONGE) IMPLANT
SUT PROLENE 6 0 BV (SUTURE) IMPLANT
SUT VIC AB 1 CT1 18XBRD ANBCTR (SUTURE) ×1 IMPLANT
SUT VIC AB 1 CT1 8-18 (SUTURE) ×1
SUT VIC AB 2-0 CP2 18 (SUTURE) ×1 IMPLANT
SUT VIC AB 3-0 SH 8-18 (SUTURE) ×1 IMPLANT
SUT VIC AB 4-0 RB1 18 (SUTURE) ×1 IMPLANT
SYR 3ML LL SCALE MARK (SYRINGE) ×4 IMPLANT
SYR 5ML LL (SYRINGE) IMPLANT
TIP CART INSTAFILL GL 5CC (ORTHOPEDIC DISPOSABLE SUPPLIES) IMPLANT
TOWEL GREEN STERILE (TOWEL DISPOSABLE) ×1 IMPLANT
TOWEL GREEN STERILE FF (TOWEL DISPOSABLE) ×1 IMPLANT
TRAY FOLEY MTR SLVR 16FR STAT (SET/KITS/TRAYS/PACK) ×1 IMPLANT
WATER STERILE IRR 1000ML POUR (IV SOLUTION) ×1 IMPLANT

## 2023-01-03 NOTE — H&P (Signed)
Megan Williams is an 77 y.o. female.   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  Patient has previously had a T12 compression fracture which was treated with an acrylic balloon kyphoplasty nonetheless she has chronic ongoing pain in her back and her legs and because of the high-grade stenosis at the L1-L2 level have advised surgical decompression via posterior discectomy posterior interbody arthrodesis and fusion from L1 to the previous mass.  She is admitted for that  Past Medical History:  Diagnosis Date   Allergy    Anxiety    Arthritis    Atrial fibrillation (HCC)    Complication of anesthesia    "difficulty waking up. I could hear them but I couldn't wake up"   COPD (chronic obstructive pulmonary disease) (HCC)    Depression    Emphysema    Esophagitis    GERD (gastroesophageal reflux disease)    Herpes simplex    History of hiatal hernia    Hypertension    Hypothyroidism    Migraines    Osteopenia    Vitamin D deficiency     Past Surgical History:  Procedure Laterality Date   BACK SURGERY  02/14/2018   lumbar 3-4  fusion   BREAST EXCISIONAL BIOPSY Left 1979   Benign    CATARACT EXTRACTION, BILATERAL     CERVICAL FUSION     CHOLECYSTECTOMY     KYPHOPLASTY  09/19/2015   Dr. Danielle Dess  T12   LUMBAR DISC SURGERY  11/11/2017   L3-4   Thumb surg     THYROIDECTOMY     TONSILLECTOMY     VAGINAL HYSTERECTOMY      Family History  Adopted: Yes  Family history unknown: Yes   Social History:  reports that she has been smoking cigarettes. She started smoking about 56 years ago. She has a 28.4 pack-year smoking history. She has never used smokeless tobacco. She reports current  alcohol use of about 1.0 standard drink of alcohol per week. She reports that she does not use drugs.  Allergies:  Allergies  Allergen Reactions   Doxycycline Hives, Itching, Nausea And Vomiting and Other (See Comments)    Skin peeling, Stevens-Johnson Syndrome    Latex Rash, Hives and Itching   Morphine Itching and Other (See Comments)    Confusion, Dizziness   Penicillins Hives, Diarrhea and Nausea Only   Grass Extracts [Gramineae Pollens] Cough and Itching    Other reaction(s): Eye Redness   Short Ragweed Pollen Ext Cough and Itching    Other reaction(s): Eye Redness, Other   Celebrex [Celecoxib] Other (See Comments)    Blistering, pain, swelling   Cephalosporins Hives, Diarrhea and Nausea Only   Codeine Nausea And Vomiting   Gabapentin     Causes TICS     Medications Prior to Admission  Medication Sig Dispense Refill   acetaminophen (TYLENOL) 650 MG CR tablet Take 1,300 mg by mouth 2 (two) times daily.     ARTIFICIAL TEAR SOLUTION OP Place 1 drop into both eyes 4 (four) times daily as needed (dry eyes).     aspirin EC 81 MG tablet Take 81 mg by mouth daily. Swallow whole.     cholecalciferol (VITAMIN D3)  25 MCG (1000 UNIT) tablet Take 1,000 Units by mouth daily.     cyanocobalamin (VITAMIN B12) 1000 MCG tablet Take 2,000 mcg by mouth daily.     diazepam (VALIUM) 5 MG tablet Take 5 mg by mouth 3 (three) times daily as needed for anxiety.     estradiol (ESTRACE) 1 MG tablet Take 1 tablet (1 mg total) by mouth daily. 90 tablet 3   fenofibrate 160 MG tablet TAKE 1 TABLET BY MOUTH DAILY. 90 tablet 3   fexofenadine (ALLEGRA) 60 MG tablet Take 60 mg by mouth daily.     fluticasone (FLONASE) 50 MCG/ACT nasal spray Place 1 spray into both nostrils daily.     ibuprofen (IBU) 600 MG tablet TAKE 1 TABLET BY MOUTH 2 TIMES DAILY. (Patient taking differently: Take 600 mg by mouth 2 (two) times daily as needed for moderate pain (pain score 4-6).) 60 tablet 5   levothyroxine (SYNTHROID) 88  MCG tablet TAKE 1 TABLET (88 MCG TOTAL) BY MOUTH DAILY. 90 tablet 0   Melatonin 10 MG TABS Take 10 mg by mouth at bedtime.     memantine (NAMENDA) 10 MG tablet Take 1 tablet (10 mg total) by mouth 2 (two) times daily. TAKE 10 mg TABLET BY MOUTH 2 TIMES DAILY. 180 tablet 4   Multiple Vitamins-Minerals (ADULT GUMMY PO) Take 2 capsules by mouth daily.     Olopatadine HCl (PATADAY OP) Place 1 drop into both eyes 2 (two) times daily as needed (allergies).     pantoprazole (PROTONIX) 40 MG tablet TAKE 1 TABLET BY MOUTH DAILY. (Patient taking differently: Take 40 mg by mouth at bedtime.) 90 tablet 3   pregabalin (LYRICA) 75 MG capsule Take 75 mg by mouth 2 (two) times daily.     propranolol (INDERAL) 40 MG tablet Take 1 tablet (40 mg total) by mouth 2 (two) times daily. 180 tablet 3   RESTASIS 0.05 % ophthalmic emulsion Place 1 drop into both eyes 2 (two) times daily.     rosuvastatin (CRESTOR) 5 MG tablet Take 1 tablet (5 mg total) by mouth daily. (Patient taking differently: Take 5 mg by mouth at bedtime.) 90 tablet 3   traZODone (DESYREL) 100 MG tablet Take 200 mg by mouth at bedtime.     valACYclovir (VALTREX) 500 MG tablet TAKE 1 TABLET BY MOUTH TWICE A DAY FOR 5 DAYS (Patient taking differently: Take 500 mg by mouth 2 (two) times daily as needed (shingles).) 10 tablet 5   venlafaxine XR (EFFEXOR-XR) 150 MG 24 hr capsule Take 300 mg by mouth daily with breakfast.     Vibegron (GEMTESA) 75 MG TABS Take 75 mg by mouth daily.     denosumab (PROLIA) 60 MG/ML SOLN injection Inject 60 mg into the skin every 6 (six) months. Administer in upper arm, thigh, or abdomen     rOPINIRole (REQUIP) 0.5 MG tablet Take 0.5 mg by mouth at bedtime as needed (restless legs).      No results found for this or any previous visit (from the past 48 hour(s)). No results found.  Review of Systems  Constitutional:  Positive for activity change.  Musculoskeletal:  Positive for back pain, gait problem and myalgias.   Neurological:  Positive for weakness and numbness.  All other systems reviewed and are negative.   Blood pressure 136/74, pulse 82, temperature 98.5 F (36.9 C), temperature source Oral, resp. rate 18, height 5\' 2"  (1.575 m), weight 59 kg, SpO2 94%. Physical Exam Constitutional:  Appearance: Normal appearance. She is normal weight.  HENT:     Head: Normocephalic and atraumatic.     Right Ear: Tympanic membrane, ear canal and external ear normal.     Left Ear: Tympanic membrane, ear canal and external ear normal.     Nose: Nose normal.     Mouth/Throat:     Mouth: Mucous membranes are moist.     Pharynx: Oropharynx is clear.  Eyes:     Extraocular Movements: Extraocular movements intact.     Conjunctiva/sclera: Conjunctivae normal.     Pupils: Pupils are equal, round, and reactive to light.  Cardiovascular:     Rate and Rhythm: Normal rate and regular rhythm.     Pulses: Normal pulses.     Heart sounds: Normal heart sounds.  Pulmonary:     Effort: Pulmonary effort is normal.     Breath sounds: Normal breath sounds.  Abdominal:     General: Abdomen is flat. Bowel sounds are normal.     Palpations: Abdomen is soft.  Musculoskeletal:     Cervical back: Normal range of motion and neck supple.     Comments: Positive straight leg raising bilaterally at 30 degrees.  Patrick's maneuver is negative bilaterally.  Skin:    General: Skin is warm and dry.     Capillary Refill: Capillary refill takes less than 2 seconds.  Neurological:     General: No focal deficit present.     Mental Status: She is alert. Mental status is at baseline.  Psychiatric:        Mood and Affect: Mood normal.        Behavior: Behavior normal.        Thought Content: Thought content normal.        Judgment: Judgment normal.      Assessment/Plan Recurrent herniated nucleus pulposus L1-L2.  Plan: Posterior decompression L1-L2 with arthrodesis L1-L2.  Stefani Dama, MD 01/03/2023, 10:34 AM

## 2023-01-03 NOTE — Progress Notes (Signed)
Pharmacy Antibiotic Note  Megan Williams is a 77 y.o. female admitted on 01/03/2023 with surgical prophylaxis.  Pharmacy has been consulted for vancomycin dosing.  Confirmed pt had hives all over her body on penicillin previously. No drain per RN. ClCr 32 ml/min. Received 1g vancomycin at 10:00.   Plan: Vancomycin 500mg  x1   Height: 5\' 2"  (157.5 cm) Weight: 59 kg (130 lb) IBW/kg (Calculated) : 50.1  Temp (24hrs), Avg:98.3 F (36.8 C), Min:98.1 F (36.7 C), Max:98.5 F (36.9 C)  Recent Labs  Lab 12/30/22 1200  WBC 9.4  CREATININE 1.18*    Estimated Creatinine Clearance: 32.6 mL/min (A) (by C-G formula based on SCr of 1.18 mg/dL (H)).    Allergies  Allergen Reactions   Doxycycline Hives, Itching, Nausea And Vomiting and Other (See Comments)    Skin peeling, Stevens-Johnson Syndrome    Latex Rash, Hives and Itching   Morphine Itching and Other (See Comments)    Confusion, Dizziness   Penicillins Hives, Diarrhea and Nausea Only   Grass Extracts [Gramineae Pollens] Cough and Itching    Other reaction(s): Eye Redness   Short Ragweed Pollen Ext Cough and Itching    Other reaction(s): Eye Redness, Other   Celebrex [Celecoxib] Other (See Comments)    Blistering, pain, swelling   Cephalosporins Hives, Diarrhea and Nausea Only   Codeine Nausea And Vomiting   Gabapentin     Causes TICS      Thank you for allowing pharmacy to be a part of this patient's care.  Alphia Moh, PharmD, BCPS, BCCP Clinical Pharmacist  Please check AMION for all [facility] Pharmacy phone numbers After 10:00 PM, call Main Pharmacy [phone]

## 2023-01-03 NOTE — Anesthesia Procedure Notes (Signed)
Procedure Name: Intubation Date/Time: 01/03/2023 11:19 AM  Performed by: Randa Evens, CRNAPre-anesthesia Checklist: Patient identified, Emergency Drugs available, Suction available and Patient being monitored Patient Re-evaluated:Patient Re-evaluated prior to induction Oxygen Delivery Method: Circle System Utilized Preoxygenation: Pre-oxygenation with 100% oxygen Induction Type: IV induction Ventilation: Mask ventilation without difficulty Laryngoscope Size: Glidescope and 3 Grade View: Grade I Tube type: Oral Tube size: 7.0 mm Number of attempts: 1 Airway Equipment and Method: Stylet and Oral airway Placement Confirmation: ETT inserted through vocal cords under direct vision, positive ETCO2 and breath sounds checked- equal and bilateral Secured at: 21 cm Tube secured with: Tape Dental Injury: Teeth and Oropharynx as per pre-operative assessment

## 2023-01-03 NOTE — Op Note (Signed)
Date of surgery: 01/03/2023 Preoperative diagnosis: Lumbar spondylosis and stenosis L1-L2.  Herniated nucleus pulposus L1-L2.  Lumbar radiculopathy.  History of fusion L2-L5.  History of compression fracture T12. Postoperative diagnosis: Same Procedure: Bilateral laminotomies and decompression of L1-L2 with total discectomy and more work than required for simple interbody technique.  Posteriorly lumbar interbody arthrodesis L1-L2 with local autograft and allograft, Proteus and pedicle screw fixation L1 to L2-L5 previous fixation.  Posterolateral arthrodesis with local autograft allograft and Proteus.  Augmentation of L1 pedicle screws.  Fluoroscopic guidance. Surgeon: Barnett Abu Anesthesia: General endotracheal Indications: The patient is a 77 year old individual who has had significant back and bilateral lower extremity pain she has had previous surgical decompressions and fusions at L4-5 L3-4 and most recently L2-3 each time she has developed adjacent level disease.  She now has Velp deterioration of L1-L2 with a retrolisthesis and a large central disc herniation creating severe stenosis at the L1-L2 level.  She is advised regarding the need for posterior decompression interbody arthrodesis and stabilization of L1 to the L2-L5 construct.  Procedure: The patient was brought to the operating room supine on the stretcher.  After the smooth induction of general endotracheal anesthesia, she was carefully turned prone.  The back was prepped with alcohol DuraPrep and draped in a sterile fashion.  A localizing radiograph was performed to identify the L1-L2 space.  The vertical incision was created over this area and carried down to the lumbodorsal fascia which was opened on either side of midline.  The previous hardware was exposed at the level of L2.  The dissection was then carried superiorly to expose the L1-L2 interspace.  Dissection was carried out over the transverse process to the area of the pars at the  L1-L2 space.  The L1 pedicle entry sites were then isolated.  This area was then packed off for later use and grafting inferior to the L2 screws the rod was cleared to allow attachment of a transverse connector.  2W style connectors were then attached to the rods medial to the rods.  Then laminotomies were created removing the inferior margin lamina of L1 out to and including the entirety of the facet once the bone fragments were removed and used for bone grafting the thickened redundant yellow ligament in this area was taken up and care was taken to decompress the central part of the dural tube bilaterally.  Then by carefully working on the lateral aspect of the tube the epidural space just beneath the disc space could be opened and a significant quantity of severely degenerated disc material that was somewhat adherent to the posterior longitudinal ligament was carefully removed.  This allowed for good decompression of the epidural space.  Then that the space was entered and it was noted to be quite severely stenotic it was gradually opened by working from side-to-side in the interbody space and ultimately the endplates could be decorticated using a combination of curettes a high-speed drill and rongeurs 2 mm punches were used to remove disc space from near the ventral aspect of the disc space and the anterior longitudinal ligament.  The area was opened from left to right and then it was decided that a 6 mm spacer could be placed into the interspace with 0 degrees lordosis.  2 titanium spacers measuring 23 mm in length and 6 mm in height and 9 mm in width were packed with autograft allograft and Proteus and placed into the interspace along with a total of 5 cc of bone  graft.  Attention was then turned to placing pedicle screws and the pedicle entry sites chosen at L1.  Fluoroscopic guidance was used to verify positioning of the screws and the screws that were placed were fenestrated methacrylate was then mixed and  1 cc of methacrylate was injected in each of the fenestrated screws to increase in enhanced purchase in the L1 vertebrae.  Once this was accomplished 60 mm precontoured rods had some additional contouring done to place them between the medially placed W hooks and the pedicle screws at L1.  This was then tightened in a neutral construct.  Radiographic verification of the fixation and the hardware was obtained.  Lateral gutters which had previously then been decorticated were packed with an additional 9 cc of bone graft into each lateral gutter.  With this being accomplished hemostasis was checked in the epidural spaces total of 600 cc of blood loss occurred with 215 cc of Cell Saver blood being returned to the patient.  The lumbodorsal fascia was then closed with #1 Vicryl after inserting 25 cc of half percent Marcaine into the paraspinous fascia.  2-0 Vicryl was used in the subcutaneous tissues and 3-0 Vicryl subcuticularly Dermabond was placed on the skin along with a waffle type dressing.  The patient tolerated procedure well was returned to recovery room stable condition.

## 2023-01-03 NOTE — Transfer of Care (Signed)
Immediate Anesthesia Transfer of Care Note  Patient: Megan Williams  Procedure(s) Performed: Posterior Lumbar Interbody fusion  Lumbar one -Lumbar two (Back)  Patient Location: PACU  Anesthesia Type:General  Level of Consciousness: awake  Airway & Oxygen Therapy: Patient Spontanous Breathing and Patient connected to face mask  Post-op Assessment: Report given to RN  Post vital signs: Reviewed and stable  Last Vitals:  Vitals Value Taken Time  BP 138/58 01/03/23 1456  Temp    Pulse 72 01/03/23 1459  Resp 19 01/03/23 1459  SpO2 89 % 01/03/23 1459  Vitals shown include unfiled device data.  Last Pain:  Vitals:   01/03/23 0957  TempSrc:   PainSc: 5       Patients Stated Pain Goal: 5 (01/03/23 0957)  Complications: No notable events documented.

## 2023-01-03 NOTE — Anesthesia Postprocedure Evaluation (Signed)
Anesthesia Post Note  Patient: Megan Williams  Procedure(s) Performed: Posterior Lumbar Interbody fusion  Lumbar one -Lumbar two (Back)     Patient location during evaluation: PACU Anesthesia Type: General Level of consciousness: awake and alert Pain management: pain level controlled Vital Signs Assessment: post-procedure vital signs reviewed and stable Respiratory status: spontaneous breathing, nonlabored ventilation, respiratory function stable and patient connected to nasal cannula oxygen Cardiovascular status: blood pressure returned to baseline and stable Postop Assessment: no apparent nausea or vomiting Anesthetic complications: no   No notable events documented.  Last Vitals:  Vitals:   01/03/23 1616 01/03/23 1617  BP: (!) 128/92   Pulse: 73 69  Resp: 18   Temp: 36.6 C   SpO2: 91% 94%    Last Pain:  Vitals:   01/03/23 1626  TempSrc:   PainSc: 6                  Breckan Cafiero P Irmalee Riemenschneider

## 2023-01-04 DIAGNOSIS — I10 Essential (primary) hypertension: Secondary | ICD-10-CM | POA: Diagnosis not present

## 2023-01-04 DIAGNOSIS — Z91040 Latex allergy status: Secondary | ICD-10-CM | POA: Diagnosis not present

## 2023-01-04 DIAGNOSIS — J449 Chronic obstructive pulmonary disease, unspecified: Secondary | ICD-10-CM | POA: Diagnosis not present

## 2023-01-04 DIAGNOSIS — M48061 Spinal stenosis, lumbar region without neurogenic claudication: Secondary | ICD-10-CM | POA: Diagnosis not present

## 2023-01-04 DIAGNOSIS — I4891 Unspecified atrial fibrillation: Secondary | ICD-10-CM | POA: Diagnosis not present

## 2023-01-04 DIAGNOSIS — E039 Hypothyroidism, unspecified: Secondary | ICD-10-CM | POA: Diagnosis not present

## 2023-01-04 DIAGNOSIS — M4726 Other spondylosis with radiculopathy, lumbar region: Secondary | ICD-10-CM | POA: Diagnosis not present

## 2023-01-04 DIAGNOSIS — M5116 Intervertebral disc disorders with radiculopathy, lumbar region: Secondary | ICD-10-CM | POA: Diagnosis not present

## 2023-01-04 DIAGNOSIS — F17210 Nicotine dependence, cigarettes, uncomplicated: Secondary | ICD-10-CM | POA: Diagnosis not present

## 2023-01-04 LAB — CBC
HCT: 32.2 % — ABNORMAL LOW (ref 36.0–46.0)
Hemoglobin: 10.5 g/dL — ABNORMAL LOW (ref 12.0–15.0)
MCH: 32 pg (ref 26.0–34.0)
MCHC: 32.6 g/dL (ref 30.0–36.0)
MCV: 98.2 fL (ref 80.0–100.0)
Platelets: 226 10*3/uL (ref 150–400)
RBC: 3.28 MIL/uL — ABNORMAL LOW (ref 3.87–5.11)
RDW: 14 % (ref 11.5–15.5)
WBC: 13.4 10*3/uL — ABNORMAL HIGH (ref 4.0–10.5)
nRBC: 0 % (ref 0.0–0.2)

## 2023-01-04 LAB — BASIC METABOLIC PANEL
Anion gap: 6 (ref 5–15)
BUN: 13 mg/dL (ref 8–23)
CO2: 27 mmol/L (ref 22–32)
Calcium: 9 mg/dL (ref 8.9–10.3)
Chloride: 103 mmol/L (ref 98–111)
Creatinine, Ser: 1.05 mg/dL — ABNORMAL HIGH (ref 0.44–1.00)
GFR, Estimated: 55 mL/min — ABNORMAL LOW (ref >60)
Glucose, Bld: 137 mg/dL — ABNORMAL HIGH (ref 70–99)
Potassium: 4 mmol/L (ref 3.5–5.1)
Sodium: 136 mmol/L (ref 135–145)

## 2023-01-04 MED ORDER — DEXAMETHASONE 4 MG PO TABS
2.0000 mg | ORAL_TABLET | Freq: Two times a day (BID) | ORAL | Status: DC
  Administered 2023-01-04 – 2023-01-05 (×3): 2 mg via ORAL
  Filled 2023-01-04 (×3): qty 1

## 2023-01-04 NOTE — Evaluation (Signed)
Physical Therapy Evaluation Patient Details Name: Megan Williams MRN: [id number] DOB: August 11, 1945 Today's Date: 01/04/2023  History of Present Illness  Admitted with Lumbar spondylosis and stenosis L1-L2.  Herniated nucleus pulposus L1-L2; s/p  Bilateral laminotomies and decompression of L1-L2 with total discectomy and more work than required for simple interbody technique.  Posteriorly lumbar interbody arthrodesis L1-L2, pedicle screw fixation L1 to L2-L5 previous fixation; has a past medical history of Allergy, Anxiety, Arthritis, Atrial fibrillation (HCC), Complication of anesthesia, COPD (chronic obstructive pulmonary disease) (HCC), Depression, Emphysema, Esophagitis, GERD (gastroesophageal reflux disease), Herpes simplex, History of hiatal hernia, Hypertension, Hypothyroidism, Migraines, Osteopenia, and Vitamin D deficiency.  Clinical Impression   Pt admitted with above diagnosis. Lives at home with husband, in a multi-level home (able to stay on main level) with 3 steps to enter; Prior to admission, pt independent with mobility and ADLs; Presents to PT with gait unsteadiness, decr balance, incr fall risk; Baseline cognition unknown at this time; Currently needs min assist for bed mobility and transfers, min assist for ambulation with RW; Mod assist to negotiate 3 steps; Currently needs very close guard assist when standing and walking to prevent loss of balance and fall;  Pt currently with functional limitations due to the deficits listed below (see PT Problem List). Pt will benefit from skilled PT to increase their independence and safety with mobility to allow discharge to the venue listed below.           If plan is discharge home, recommend the following: A little help with walking and/or transfers;A lot of help with bathing/dressing/bathroom;Assistance with cooking/housework;Assist for transportation;Help with stairs or ramp for entrance   Can travel by private vehicle        Equipment  Recommendations BSC/3in1  Recommendations for Other Services  OT consult    Functional Status Assessment Patient has had a recent decline in their functional status and demonstrates the ability to make significant improvements in function in a reasonable and predictable amount of time.     Precautions / Restrictions Precautions Precautions: Back Required Braces or Orthoses: Spinal Brace Spinal Brace: Applied in sitting position Restrictions Weight Bearing Restrictions: No      Mobility  Bed Mobility Overal bed mobility: Needs Assistance Bed Mobility: Rolling, Sidelying to Sit Rolling: Contact guard assist Sidelying to sit: Min assist       General bed mobility comments: Good log roll; min assist to elevate trunk    Transfers Overall transfer level: Needs assistance Equipment used: Rolling walker (2 wheels) Transfers: Sit to/from Stand Sit to Stand: Min assist           General transfer comment: Min assist to steady at initial stand; tending to lean basckward, braced backs of LEs against the bed    Ambulation/Gait Ambulation/Gait assistance: Min assist, Mod assist Gait Distance (Feet): 95 Feet Assistive device: Rolling walker (2 wheels) Gait Pattern/deviations: Step-through pattern, Decreased step length - right, Decreased step length - left Gait velocity: very slow     General Gait Details: Moderately unsteady; Verbal and tactile cues to keep RW close; Short steps, and noting pt having difficulty walking and having a conversation at the same time; Occasional Light mod assist to steady; noteworthy tremulousness with standing and walking; pt attributes to essential tremor; at current level, caregiver must keep hands on pt for safety while wlking  Stairs Stairs: Yes Stairs assistance: Mod assist Stair Management: One rail Right, Forwards, Sideways, Step to pattern Number of Stairs: 3 General stair comments: Cues for  sequence; pt initiated going up forwards, and  needed light mod assist to power up; performed last step sideways facing the rail, and noted less need for lift assist; came down the three steps with sideways technique  Wheelchair Mobility     Tilt Bed    Modified Rankin (Stroke Patients Only)       Balance     Sitting balance-Leahy Scale: Fair     Standing balance support: Single extremity supported, During functional activity Standing balance-Leahy Scale: Poor Standing balance comment: posterior bias                             Pertinent Vitals/Pain Pain Assessment Pain Assessment: No/denies pain    Home Living Family/patient expects to be discharged to:: Private residence Living Arrangements: Spouse/significant other Available Help at Discharge: Family;Available 24 hours/day Type of Home: House Home Access: Stairs to enter Entrance Stairs-Rails: Right Entrance Stairs-Number of Steps: 3 Alternate Level Stairs-Number of Steps: 12 Home Layout: Multi-level (Bonus room with stairs to access) Home Equipment: Agricultural consultant (2 wheels);Shower seat;Adaptive equipment      Prior Function Prior Level of Function : Driving;Independent/Modified Independent                     Extremity/Trunk Assessment   Upper Extremity Assessment Upper Extremity Assessment: Defer to OT evaluation    Lower Extremity Assessment Lower Extremity Assessment: Generalized weakness    Cervical / Trunk Assessment Cervical / Trunk Assessment: Back Surgery  Communication   Communication Communication: No apparent difficulties  Cognition Arousal: Alert Behavior During Therapy: WFL for tasks assessed/performed, Impulsive Overall Cognitive Status: Impaired/Different from baseline Area of Impairment: Attention, Memory, Following commands, Safety/judgement, Problem solving, Awareness                   Current Attention Level: Sustained Memory: Decreased short-term memory, Decreased recall of precautions Following  Commands: Follows one step commands consistently Safety/Judgement: Decreased awareness of safety, Decreased awareness of deficits Awareness: Emergent Problem Solving: Slow processing, Requires verbal cues General Comments: Seemed emotional re: difficulty with her balance/posterior lean        General Comments General comments (skin integrity, edema, etc.): NAD on RA    Exercises     Assessment/Plan    PT Assessment Patient needs continued PT services  PT Problem List Decreased strength;Decreased activity tolerance;Decreased range of motion;Decreased balance;Decreased mobility;Decreased coordination;Decreased cognition;Decreased knowledge of use of DME;Decreased safety awareness;Decreased knowledge of precautions;Pain       PT Treatment Interventions DME instruction;Gait training;Stair training;Functional mobility training;Therapeutic activities;Therapeutic exercise;Balance training;Neuromuscular re-education;Cognitive remediation;Patient/family education    PT Goals (Current goals can be found in the Care Plan section)  Acute Rehab PT Goals Patient Stated Goal: Hopes to be home soon PT Goal Formulation: With patient Time For Goal Achievement: 01/18/23 Potential to Achieve Goals: Good    Frequency Min 5X/week     Co-evaluation               AM-PAC PT "6 Clicks" Mobility  Outcome Measure Help needed turning from your back to your side while in a flat bed without using bedrails?: A Little Help needed moving from lying on your back to sitting on the side of a flat bed without using bedrails?: A Little Help needed moving to and from a bed to a chair (including a wheelchair)?: A Lot Help needed standing up from a chair using your arms (e.g., wheelchair or bedside chair)?: A Lot  Help needed to walk in hospital room?: A Lot Help needed climbing 3-5 steps with a railing? : A Lot 6 Click Score: 14    End of Session Equipment Utilized During Treatment: Back brace Activity  Tolerance: Patient tolerated treatment well Patient left: in chair;with call bell/phone within reach Nurse Communication: Mobility status PT Visit Diagnosis: Unsteadiness on feet (R26.81);Pain;Muscle weakness (generalized) (M62.81);History of falling (Z91.81) Pain - part of body:  (Center back)    Time: 0981-1914 PT Time Calculation (min) (ACUTE ONLY): 46 min   Charges:   PT Evaluation $PT Eval Moderate Complexity: 1 Mod PT Treatments $Gait Training: 23-37 mins PT General Charges $$ ACUTE PT VISIT: 1 Visit         Van Clines, PT  Acute Rehabilitation Services Office [phone] Secure Chat welcomed   Levi Aland 01/04/2023, 1:36 PM

## 2023-01-04 NOTE — TOC Transition Note (Signed)
Transition of Care [facility]) - CM/SW Discharge Note   Patient Details  Name: Megan Williams MRN: [id number] Date of Birth: 07-07-1945  [facility]) CM/SW Contact:  Kermit Balo, RN Phone Number: 01/04/2023, 11:00 AM   Clinical Narrative:     Pt will discharge home with home health services through [facility]. CM provided pt choice with [email or web address] information on [facility] on AVS and they will contact the patient for the first home visit. Pt has transportation home.   Final next level of care: Home w Home Health Services Barriers to Discharge: No Barriers Identified   Patient Goals and CMS Choice CMS Medicare.gov Compare Post Acute Care list provided to:: Patient Choice offered to / list presented to : Patient  Discharge Placement                         Discharge Plan and Services Additional resources added to the After Visit Summary for                            [facility] Arranged: PT, OT [facility] Agency: [facility] Date [facility] Agency Contacted: 01/04/23   Representative spoke with at [facility] Agency: Tresa Endo  Social Determinants of Health (SDOH) Interventions SDOH Screenings   Food Insecurity: No Food Insecurity (10/28/2022)  Housing: Low Risk  (10/28/2022)  Transportation Needs: No Transportation Needs (10/28/2022)  Utilities: Not At Risk (10/28/2022)  Alcohol Screen: Low Risk  (07/12/2022)  Depression (PHQ2-9): Low Risk  (07/15/2022)  Financial Resource Strain: Low Risk  (10/14/2022)  Physical Activity: Inactive (10/28/2022)  Social Connections: Socially Integrated (10/28/2022)  Stress: No Stress Concern Present (10/28/2022)  Tobacco Use: High Risk (01/03/2023)  Health Literacy: Adequate Health Literacy (10/28/2022)     Readmission Risk Interventions    05/02/2020    2:02 PM  Readmission Risk Prevention Plan  Post Dischage Appt Complete  Medication Screening Complete  Transportation Screening Complete

## 2023-01-04 NOTE — Evaluation (Signed)
Occupational Therapy Evaluation Patient Details Name: Megan Williams MRN: [id number] DOB: 1945-11-22 Today's Date: 01/04/2023   History of Present Illness Admitted with Lumbar spondylosis and stenosis L1-L2.  Herniated nucleus pulposus L1-L2; s/p  Bilateral laminotomies and decompression of L1-L2 with total discectomy and more work than required for simple interbody technique.  Posteriorly lumbar interbody arthrodesis L1-L2, pedicle screw fixation L1 to L2-L5 previous fixation; has a past medical history of Allergy, Anxiety, Arthritis, Atrial fibrillation (HCC), Complication of anesthesia, COPD (chronic obstructive pulmonary disease) (HCC), Depression, Emphysema, Esophagitis, GERD (gastroesophageal reflux disease), Herpes simplex, History of hiatal hernia, Hypertension, Hypothyroidism, Migraines, Osteopenia, and Vitamin D deficiency.   Clinical Impression   Megan Williams was evaluated s/p the above admission list. She lives with her husband and is indep at baseline. Upon evaluation the pt was limited by pain, spinal precautions, impaired cognition, posterior bias with unsteady balance and poor activity tolerance. Overall she needed min A for bed mobility and close CGA for all transfers and mobility with RW. Due to the deficits listed below the pt also needs up to min A for LB ADLs and set up A for UB ADLs in sitting. Pt educated on spinal precautions and compensatory techniques throughout with limited recall. Pt will benefit from continued acute OT services and HHOT.         If plan is discharge home, recommend the following: A little help with walking and/or transfers;A little help with bathing/dressing/bathroom;Assistance with cooking/housework;Direct supervision/assist for medications management;Direct supervision/assist for financial management;Assist for transportation;Help with stairs or ramp for entrance    Functional Status Assessment  Patient has had a recent decline in their functional status and  demonstrates the ability to make significant improvements in function in a reasonable and predictable amount of time.        Precautions / Restrictions Precautions Precautions: Back Required Braces or Orthoses: Spinal Brace Spinal Brace: Applied in sitting position Restrictions Weight Bearing Restrictions: No      Mobility Bed Mobility Overal bed mobility: Needs Assistance Bed Mobility: Rolling, Sit to Sidelying Rolling: Contact guard assist       Sit to sidelying: Min assist General bed mobility comments: min A to bring BLEs back into bed    Transfers Overall transfer level: Needs assistance Equipment used: Rolling walker (2 wheels) Transfers: Sit to/from Stand Sit to Stand: Contact guard assist                  Balance Overall balance assessment: Needs assistance Sitting-balance support: Feet supported Sitting balance-Leahy Scale: Fair     Standing balance support: Single extremity supported, During functional activity Standing balance-Leahy Scale: Poor Standing balance comment: posterior bias                           ADL either performed or assessed with clinical judgement   ADL Overall ADL's : Needs assistance/impaired Eating/Feeding: Independent   Grooming: Contact guard assist;Standing   Upper Body Bathing: Set up;Sitting   Lower Body Bathing: Minimal assistance;Sit to/from stand   Upper Body Dressing : Set up;Sitting   Lower Body Dressing: Minimal assistance;Sit to/from stand   Toilet Transfer: Contact guard assist;Ambulation;Regular Toilet;Rolling walker (2 wheels)   Toileting- Clothing Manipulation and Hygiene: Supervision/safety;Sitting/lateral lean       Functional mobility during ADLs: Contact guard assist;Rolling walker (2 wheels) General ADL Comments: cues for back precautions, pt limited by impaired cognition adn posterior bias     Vision Baseline Vision/History: 0 No visual  deficits Vision Assessment?: No apparent  visual deficits     Perception Perception: Within Functional Limits       Praxis Praxis: WFL       Pertinent Vitals/Pain Pain Assessment Pain Assessment: No/denies pain     Extremity/Trunk Assessment Upper Extremity Assessment Upper Extremity Assessment: Generalized weakness   Lower Extremity Assessment Lower Extremity Assessment: Generalized weakness   Cervical / Trunk Assessment Cervical / Trunk Assessment: Back Surgery   Communication Communication Communication: No apparent difficulties   Cognition Arousal: Alert Behavior During Therapy: Impulsive Overall Cognitive Status: Impaired/Different from baseline Area of Impairment: Attention, Memory, Following commands, Safety/judgement, Problem solving, Awareness                   Current Attention Level: Sustained Memory: Decreased short-term memory, Decreased recall of precautions Following Commands: Follows one step commands inconsistently Safety/Judgement: Decreased awareness of safety, Decreased awareness of deficits Awareness: Emergent Problem Solving: Slow processing, Requires verbal cues General Comments: internally/self distracted. needs constant cues to carry out functional tasks. able to recall 3/3 back precautions, unable to recall PT education about stair negotiation or importance fro husband to assist.     General Comments  VSS on RA     Home Living Family/patient expects to be discharged to:: Private residence Living Arrangements: Spouse/significant other Available Help at Discharge: Family;Available 24 hours/day                         Home Equipment: Rolling Walker (2 wheels);Shower seat;Adaptive equipment Adaptive Equipment: Reacher        Prior Functioning/Environment Prior Level of Function : Driving;Independent/Modified Independent                        OT Problem List: Decreased strength;Decreased range of motion;Decreased activity tolerance;Impaired balance  (sitting and/or standing);Decreased safety awareness;Decreased knowledge of use of DME or AE;Decreased knowledge of precautions      OT Treatment/Interventions: Self-care/ADL training;Therapeutic exercise;DME and/or AE instruction;Therapeutic activities;Patient/family education;Balance training    OT Goals(Current goals can be found in the care plan section) Acute Rehab OT Goals Patient Stated Goal: home OT Goal Formulation: With patient Time For Goal Achievement: 01/18/23 Potential to Achieve Goals: Good ADL Goals Additional ADL Goal #1: Pt will complete all ADLs with supervision A Additional ADL Goal #2: Pt will indep maintain all spinal precautions 100% of session  OT Frequency: Min 1X/week       AM-PAC OT "6 Clicks" Daily Activity     Outcome Measure Help from another person eating meals?: None Help from another person taking care of personal grooming?: A Little Help from another person toileting, which includes using toliet, bedpan, or urinal?: A Little Help from another person bathing (including washing, rinsing, drying)?: A Little Help from another person to put on and taking off regular upper body clothing?: A Little Help from another person to put on and taking off regular lower body clothing?: A Little 6 Click Score: 19   End of Session Equipment Utilized During Treatment: Rolling walker (2 wheels) Nurse Communication: Mobility status  Activity Tolerance: Patient tolerated treatment well Patient left: in bed;with call bell/phone within reach;with bed alarm set  OT Visit Diagnosis: Unsteadiness on feet (R26.81);Other abnormalities of gait and mobility (R26.89);Muscle weakness (generalized) (M62.81)                Time: 1610-9604 OT Time Calculation (min): 25 min Charges:  OT General Charges $OT Visit: 1 Visit  OT Evaluation $OT Eval Moderate Complexity: 1 Mod OT Treatments $Self Care/Home Management : 8-22 mins  Derenda Mis, OTR/L Acute Rehabilitation  Services Office [phone] Secure Chat Communication Preferred   Donia Pounds 01/04/2023, 11:23 AM

## 2023-01-04 NOTE — Progress Notes (Signed)
Patient ID: Megan Williams, female   DOB: 1945-06-01, 77 y.o.   MRN: [id number] Patient is awake and alert she notes that she has a fair amount of pain today. Her hemoglobin is 10.2 Electrolytes are okay creatinine is 1.08.  This is actually improved from preop.  I have advised that we will add some low-dose Decadron to help with the soreness.  She wishes to stay today and we will plan to discharge for tomorrow.

## 2023-01-05 ENCOUNTER — Other Ambulatory Visit: Payer: Self-pay

## 2023-01-05 DIAGNOSIS — E039 Hypothyroidism, unspecified: Secondary | ICD-10-CM | POA: Diagnosis not present

## 2023-01-05 DIAGNOSIS — I10 Essential (primary) hypertension: Secondary | ICD-10-CM | POA: Diagnosis not present

## 2023-01-05 DIAGNOSIS — M48061 Spinal stenosis, lumbar region without neurogenic claudication: Secondary | ICD-10-CM | POA: Diagnosis not present

## 2023-01-05 DIAGNOSIS — J449 Chronic obstructive pulmonary disease, unspecified: Secondary | ICD-10-CM | POA: Diagnosis not present

## 2023-01-05 DIAGNOSIS — F17210 Nicotine dependence, cigarettes, uncomplicated: Secondary | ICD-10-CM | POA: Diagnosis not present

## 2023-01-05 DIAGNOSIS — I4891 Unspecified atrial fibrillation: Secondary | ICD-10-CM | POA: Diagnosis not present

## 2023-01-05 DIAGNOSIS — Z91040 Latex allergy status: Secondary | ICD-10-CM | POA: Diagnosis not present

## 2023-01-05 DIAGNOSIS — M5116 Intervertebral disc disorders with radiculopathy, lumbar region: Secondary | ICD-10-CM | POA: Diagnosis not present

## 2023-01-05 DIAGNOSIS — M4726 Other spondylosis with radiculopathy, lumbar region: Secondary | ICD-10-CM | POA: Diagnosis not present

## 2023-01-05 MED ORDER — DEXAMETHASONE 1 MG PO TABS: ORAL_TABLET | ORAL | 0 refills | Status: DC

## 2023-01-05 MED ORDER — METHOCARBAMOL 500 MG PO TABS: 500.0000 mg | ORAL_TABLET | Freq: Four times a day (QID) | ORAL | 3 refills | Status: DC | PRN

## 2023-01-05 MED ORDER — OXYCODONE-ACETAMINOPHEN 5-325 MG PO TABS: 1.0000 | ORAL_TABLET | Freq: Four times a day (QID) | ORAL | 0 refills | Status: DC | PRN

## 2023-01-05 MED FILL — Heparin Sodium (Porcine) Inj 1000 Unit/ML: INTRAMUSCULAR | Qty: 30 | Status: AC

## 2023-01-05 MED FILL — Sodium Chloride IV Soln 0.9%: INTRAVENOUS | Qty: 2000 | Status: AC

## 2023-01-05 NOTE — Progress Notes (Signed)
Occupational Therapy Treatment Patient Details Name: Megan Williams MRN: [id number] DOB: 28-Jul-1945 Today's Date: 01/05/2023   History of present illness Admitted with Lumbar spondylosis and stenosis L1-L2.  Herniated nucleus pulposus L1-L2; s/p  Bilateral laminotomies and decompression of L1-L2 with total discectomy and more work than required for simple interbody technique.  Posteriorly lumbar interbody arthrodesis L1-L2, pedicle screw fixation L1 to L2-L5 previous fixation; has a past medical history of Allergy, Anxiety, Arthritis, Atrial fibrillation (HCC), Complication of anesthesia, COPD (chronic obstructive pulmonary disease) (HCC), Depression, Emphysema, Esophagitis, GERD (gastroesophageal reflux disease), Herpes simplex, History of hiatal hernia, Hypertension, Hypothyroidism, Migraines, Osteopenia, and Vitamin D deficiency.   OT comments  Pt is making great progress towards their acute OT goals. Overall pt did not need any physical assist throughout session for mobility or ADLs. Pt recalled spinal precautions, compensatory techniques and brace weaj schedule without cues but needed minimal cues throughout for safety awareness. Generalized supervision A provided for transfers and mobility with RW, grooming at the sink and toileting. OT to continue to follow acutely to facilitate progress towards established goals. Pt will continue to benefit from [facility].       If plan is discharge home, recommend the following:  A little help with walking and/or transfers;A little help with bathing/dressing/bathroom;Assistance with cooking/housework;Direct supervision/assist for medications management;Direct supervision/assist for financial management;Assist for transportation;Help with stairs or ramp for entrance         Precautions / Restrictions Precautions Precautions: Back Required Braces or Orthoses: Spinal Brace Spinal Brace: Applied in standing position Restrictions Weight Bearing Restrictions: No        Mobility Bed Mobility Overal bed mobility: Needs Assistance Bed Mobility: Rolling, Sidelying to Sit Rolling: Supervision Sidelying to sit: Supervision       General bed mobility comments: increased time for pain and stiffness    Transfers Overall transfer level: Needs assistance Equipment used: Rolling walker (2 wheels) Transfers: Sit to/from Stand Sit to Stand: Supervision           General transfer comment: from bed and toilet     Balance Overall balance assessment: Needs assistance Sitting-balance support: Feet supported Sitting balance-Leahy Scale: Fair     Standing balance support: Single extremity supported, During functional activity Standing balance-Leahy Scale: Fair                             ADL either performed or assessed with clinical judgement   ADL Overall ADL's : Needs assistance/impaired     Grooming: Supervision/safety;Standing           Upper Body Dressing : Set up;Standing Upper Body Dressing Details (indicate cue type and reason): donned brace in standing without physical assist     Toilet Transfer: Supervision/safety;Rolling walker (2 wheels)           Functional mobility during ADLs: Supervision/safety;Rolling walker (2 wheels) General ADL Comments: no physical assist needed, cues for safety throughout    Extremity/Trunk Assessment Upper Extremity Assessment Upper Extremity Assessment: Overall WFL for tasks assessed   Lower Extremity Assessment Lower Extremity Assessment: Defer to PT evaluation        Vision   Vision Assessment?: No apparent visual deficits   Perception Perception Perception: Within Functional Limits   Praxis Praxis Praxis: WFL    Cognition Arousal: Alert Behavior During Therapy: WFL for tasks assessed/performed, Impulsive Overall Cognitive Status: Impaired/Different from baseline Area of Impairment: Following commands, Safety/judgement, Awareness, Problem solving  Following Commands: Follows multi-step commands consistently Safety/Judgement: Decreased awareness of deficits Awareness: Emergent Problem Solving: Slow processing, Requires verbal cues General Comments: great recall of BLT and compensatory techniques; limited insight to safety. Moments of confusion noted throughout              General Comments VSS on RA    Pertinent Vitals/ Pain       Pain Assessment Pain Assessment: No/denies pain   Frequency  Min 1X/week        Progress Toward Goals  OT Goals(current goals can now be found in the care plan section)  Progress towards OT goals: Progressing toward goals  Acute Rehab OT Goals Patient Stated Goal: home today OT Goal Formulation: With patient Time For Goal Achievement: 01/18/23 Potential to Achieve Goals: Good ADL Goals Additional ADL Goal #1: Pt will complete all ADLs with supervision A Additional ADL Goal #2: Pt will indep maintain all spinal precautions 100% of session   AM-PAC OT "6 Clicks" Daily Activity     Outcome Measure   Help from another person eating meals?: None Help from another person taking care of personal grooming?: A Little Help from another person toileting, which includes using toliet, bedpan, or urinal?: A Little Help from another person bathing (including washing, rinsing, drying)?: A Little Help from another person to put on and taking off regular upper body clothing?: A Little Help from another person to put on and taking off regular lower body clothing?: A Little 6 Click Score: 19    End of Session Equipment Utilized During Treatment: Rolling walker (2 wheels);Back brace  OT Visit Diagnosis: Unsteadiness on feet (R26.81);Other abnormalities of gait and mobility (R26.89);Muscle weakness (generalized) (M62.81)   Activity Tolerance Patient tolerated treatment well   Patient Left  (direct hand off to PT)   Nurse Communication Mobility status        Time:  0454-0981 OT Time Calculation (min): 22 min  Charges: OT General Charges $OT Visit: 1 Visit OT Treatments $Self Care/Home Management : 8-22 mins  Derenda Mis, OTR/L Acute Rehabilitation Services Office [phone] Secure Chat Communication Preferred   Donia Pounds 01/05/2023, 9:18 AM

## 2023-01-05 NOTE — Progress Notes (Signed)
Physical Therapy Treatment Patient Details Name: Megan Williams MRN: [id number] DOB: 1946-02-04 Today's Date: 01/05/2023   History of Present Illness Admitted with Lumbar spondylosis and stenosis L1-L2.  Herniated nucleus pulposus L1-L2; s/p  Bilateral laminotomies and decompression of L1-L2 with total discectomy and more work than required for simple interbody technique.  Posteriorly lumbar interbody arthrodesis L1-L2, pedicle screw fixation L1 to L2-L5 previous fixation; has a past medical history of Allergy, Anxiety, Arthritis, Atrial fibrillation (HCC), Complication of anesthesia, COPD (chronic obstructive pulmonary disease) (HCC), Depression, Emphysema, Esophagitis, GERD (gastroesophageal reflux disease), Herpes simplex, History of hiatal hernia, Hypertension, Hypothyroidism, Migraines, Osteopenia, and Vitamin D deficiency.    PT Comments  Continuing work on functional mobility and activity tolerance;  Session focused on more gait, review of stairs, in prep for possible dc today; much improved steadiness with amb and stairs; continue to recommend RW use when up standing and walking; Pt reports her husband will be present at home to provide assist; continue to rec HHPT; will follow while in hospital   If plan is discharge home, recommend the following: A little help with walking and/or transfers;A lot of help with bathing/dressing/bathroom;Assistance with cooking/housework;Assist for transportation;Help with stairs or ramp for entrance   Can travel by private vehicle        Equipment Recommendations  None recommended by PT    Recommendations for Other Services       Precautions / Restrictions Precautions Precautions: Back Precaution Booklet Issued: Yes (comment) Required Braces or Orthoses: Spinal Brace Spinal Brace: Applied in standing position Restrictions Weight Bearing Restrictions: No     Mobility  Bed Mobility               General bed mobility comments: Recieved pt  in hallway , finishing with OT    Transfers Overall transfer level: Needs assistance Equipment used: Rolling walker (2 wheels) Transfers: Sit to/from Stand Sit to Stand: Supervision           General transfer comment: from recliner    Ambulation/Gait Ambulation/Gait assistance: Contact guard assist, Supervision Gait Distance (Feet): 200 Feet Assistive device: Rolling walker (2 wheels) Gait Pattern/deviations: Step-through pattern, Decreased step length - right, Decreased step length - left       General Gait Details: Notably steadier steps, with less posterior lean today; post lean was constant yesterday, and today only occasionally and less severe; Able to incr distance well; still needs close guard for safety, but not physical assist; more able to walk and hold a conversation today   Stairs Stairs: Yes Stairs assistance: Min assist Stair Management: One rail Right Number of Stairs: 4 General stair comments: min cues for hand placement and to pull more on rail as needed; much better than yesterday   Wheelchair Mobility     Tilt Bed    Modified Rankin (Stroke Patients Only)       Balance     Sitting balance-Leahy Scale: Fair       Standing balance-Leahy Scale: Fair                              Cognition Arousal: Alert Behavior During Therapy: WFL for tasks assessed/performed, Impulsive Overall Cognitive Status: Impaired/Different from baseline Area of Impairment: Following commands, Safety/judgement, Awareness, Problem solving                       Following Commands: Follows multi-step commands consistently Safety/Judgement: Decreased awareness of  deficits Awareness: Emergent Problem Solving: Slow processing, Requires verbal cues General Comments: great recall of BLT and compensatory techniques; limited insight to safety. Moments of confusion noted throughout;        Exercises      General Comments General comments (skin  integrity, edema, etc.): NAD on RA      Pertinent Vitals/Pain Pain Assessment Pain Assessment: No/denies pain    Home Living                          Prior Function            PT Goals (current goals can now be found in the care plan section) Acute Rehab PT Goals Patient Stated Goal: Hopes to be home soon PT Goal Formulation: With patient Time For Goal Achievement: 01/18/23 Potential to Achieve Goals: Good Progress towards PT goals: Progressing toward goals    Frequency    Min 5X/week      PT Plan      Co-evaluation              AM-PAC PT "6 Clicks" Mobility   Outcome Measure  Help needed turning from your back to your side while in a flat bed without using bedrails?: A Little Help needed moving from lying on your back to sitting on the side of a flat bed without using bedrails?: A Little Help needed moving to and from a bed to a chair (including a wheelchair)?: A Little Help needed standing up from a chair using your arms (e.g., wheelchair or bedside chair)?: A Little Help needed to walk in hospital room?: A Little Help needed climbing 3-5 steps with a railing? : A Little 6 Click Score: 18    End of Session Equipment Utilized During Treatment: Back brace Activity Tolerance: Patient tolerated treatment well Patient left: in chair;with call bell/phone within reach Nurse Communication: Mobility status PT Visit Diagnosis: Unsteadiness on feet (R26.81);Pain;Muscle weakness (generalized) (M62.81);History of falling (Z91.81) Pain - part of body:  (Center back)     Time: 1478-2956 PT Time Calculation (min) (ACUTE ONLY): 26 min  Charges:    $Gait Training: 23-37 mins PT General Charges $$ ACUTE PT VISIT: 1 Visit                     Van Clines, PT  Acute Rehabilitation Services Office [phone] Secure Chat welcomed    Levi Aland 01/05/2023, 10:07 AM

## 2023-01-05 NOTE — Progress Notes (Signed)
Patient alert and oriented, void, ambulate. Surgical site clean and dry no sign of infection. D/c instructions explain and given to the patient. Patient d/c home per order.

## 2023-01-05 NOTE — Plan of Care (Signed)
  Problem: Education: Goal: Knowledge of General Education information will improve Description: Including pain rating scale, medication(s)/side effects and non-pharmacologic comfort measures Outcome: Completed/Met   Problem: Health Behavior/Discharge Planning: Goal: Ability to manage health-related needs will improve Outcome: Completed/Met   Problem: Clinical Measurements: Goal: Ability to maintain clinical measurements within normal limits will improve Outcome: Completed/Met Goal: Will remain free from infection Outcome: Completed/Met Goal: Diagnostic test results will improve Outcome: Completed/Met Goal: Respiratory complications will improve Outcome: Completed/Met Goal: Cardiovascular complication will be avoided Outcome: Completed/Met   Problem: Activity: Goal: Risk for activity intolerance will decrease Outcome: Completed/Met   Problem: Nutrition: Goal: Adequate nutrition will be maintained Outcome: Completed/Met   Problem: Coping: Goal: Level of anxiety will decrease Outcome: Completed/Met   Problem: Elimination: Goal: Will not experience complications related to bowel motility Outcome: Completed/Met Goal: Will not experience complications related to urinary retention Outcome: Completed/Met   Problem: Pain Management: Goal: General experience of comfort will improve Outcome: Completed/Met   Problem: Safety: Goal: Ability to remain free from injury will improve Outcome: Completed/Met   Problem: Skin Integrity: Goal: Risk for impaired skin integrity will decrease Outcome: Completed/Met   Problem: Education: Goal: Ability to verbalize activity precautions or restrictions will improve Outcome: Completed/Met Goal: Knowledge of the prescribed therapeutic regimen will improve Outcome: Completed/Met Goal: Understanding of discharge needs will improve Outcome: Completed/Met   Problem: Activity: Goal: Ability to avoid complications of mobility impairment will  improve Outcome: Completed/Met Goal: Ability to tolerate increased activity will improve Outcome: Completed/Met Goal: Will remain free from falls Outcome: Completed/Met   Problem: Activity: Goal: Ability to avoid complications of mobility impairment will improve Outcome: Completed/Met Goal: Ability to tolerate increased activity will improve Outcome: Completed/Met Goal: Will remain free from falls Outcome: Completed/Met   Problem: Bowel/Gastric: Goal: Gastrointestinal status for postoperative course will improve Outcome: Completed/Met   Problem: Clinical Measurements: Goal: Ability to maintain clinical measurements within normal limits will improve Outcome: Completed/Met Goal: Postoperative complications will be avoided or minimized Outcome: Completed/Met Goal: Diagnostic test results will improve Outcome: Completed/Met   Problem: Pain Management: Goal: Pain level will decrease Outcome: Completed/Met   Problem: Skin Integrity: Goal: Will show signs of wound healing Outcome: Completed/Met   Problem: Health Behavior/Discharge Planning: Goal: Identification of resources available to assist in meeting health care needs will improve Outcome: Completed/Met   Problem: Bladder/Genitourinary: Goal: Urinary functional status for postoperative course will improve Outcome: Completed/Met

## 2023-01-05 NOTE — Discharge Summary (Signed)
Physician Discharge Summary  Patient ID: Megan Williams MRN: [id number] DOB/AGE: 1945-09-26 77 y.o.  Admit date: 01/03/2023 Discharge date: 01/05/2023  Admission Diagnoses: Lumbar spinal stenosis L1-L2.  Lumbar radiculopathy L1-L2.  History of fusion L2-L5.  History of compression fracture T12.  Discharge Diagnoses: Lumbar spinal stenosis L1-L2.  Lumbar radiculopathy L1-L2.  History of fusion L2-L5.  History of compression fracture T12. Principal Problem:   Degenerative lumbar spinal stenosis   Discharged Condition: good  Hospital Course: Patient was admitted to undergo surgical decompression arthrodesis at L1-L2.  She tolerated surgery well.  Pain was well-controlled with Percocet.  Station and gait are intact.  Consults: None  Significant Diagnostic Studies: None  Treatments: surgery: See op note  Discharge Exam: Blood pressure (!) 104/45, pulse 70, temperature 98 F (36.7 C), temperature source Oral, resp. rate 16, height 5\' 2"  (1.575 m), weight 59 kg, SpO2 95%. Incision is clean and dry.  Station and gait are intact.  Disposition: Discharge disposition: 01-Home or Self Care       Discharge Instructions     Ambulatory Referral for Lung Cancer Scre   Complete by: As directed    Call MD for:  redness, tenderness, or signs of infection (pain, swelling, redness, odor or green/yellow discharge around incision site)   Complete by: As directed    Call MD for:  severe uncontrolled pain   Complete by: As directed    Call MD for:  temperature >100.4   Complete by: As directed    Diet - low sodium heart healthy   Complete by: As directed    Discharge instructions   Complete by: As directed    Okay to shower. Do not apply salves or appointments to incision. No heavy lifting with the upper extremities greater than 10 pounds. May resume driving when not requiring pain medication and patient feels comfortable with doing so.   Incentive spirometry RT   Complete by: As directed     Increase activity slowly   Complete by: As directed       Allergies as of 01/05/2023       Reactions   Doxycycline Hives, Itching, Nausea And Vomiting, Other (See Comments)   Skin peeling, Stevens-Johnson Syndrome   Latex Rash, Hives, Itching   Morphine Itching, Other (See Comments)   Confusion, Dizziness   Penicillins Hives   All over body   Grass Extracts [gramineae Pollens] Cough, Itching   Other reaction(s): Eye Redness   Short Ragweed Pollen Ext Cough, Itching   Other reaction(s): Eye Redness, Other   Celebrex [celecoxib] Other (See Comments)   Blistering, pain, swelling   Cephalosporins Hives   All over body   Codeine Nausea And Vomiting   Gabapentin    Causes TICS        Medication List     TAKE these medications    acetaminophen 650 MG CR tablet Commonly known as: TYLENOL Take 1,300 mg by mouth 2 (two) times daily.   ADULT GUMMY PO Take 2 capsules by mouth daily.   ARTIFICIAL TEAR SOLUTION OP Place 1 drop into both eyes 4 (four) times daily as needed (dry eyes).   aspirin EC 81 MG tablet Take 81 mg by mouth daily. Swallow whole.   cholecalciferol 25 MCG (1000 UNIT) tablet Commonly known as: VITAMIN D3 Take 1,000 Units by mouth daily.   cyanocobalamin 1000 MCG tablet Commonly known as: VITAMIN B12 Take 2,000 mcg by mouth daily.   denosumab 60 MG/ML Soln injection Commonly known as: PROLIA  Inject 60 mg into the skin every 6 (six) months. Administer in upper arm, thigh, or abdomen   dexamethasone 1 MG tablet Commonly known as: DECADRON 2 tablets twice daily for 2 days, one tablet twice daily for 2 days, one tablet daily for 2 days.   diazepam 5 MG tablet Commonly known as: VALIUM Take 5 mg by mouth 3 (three) times daily as needed for anxiety.   estradiol 1 MG tablet Commonly known as: ESTRACE Take 1 tablet (1 mg total) by mouth daily.   fenofibrate 160 MG tablet TAKE 1 TABLET BY MOUTH DAILY.   fexofenadine 60 MG tablet Commonly known as:  ALLEGRA Take 60 mg by mouth daily.   fluticasone 50 MCG/ACT nasal spray Commonly known as: FLONASE Place 1 spray into both nostrils daily.   Gemtesa 75 MG Tabs Generic drug: Vibegron Take 75 mg by mouth daily.   IBU 600 MG tablet Generic drug: ibuprofen TAKE 1 TABLET BY MOUTH 2 TIMES DAILY. What changed:  when to take this reasons to take this   levothyroxine 88 MCG tablet Commonly known as: SYNTHROID TAKE 1 TABLET (88 MCG TOTAL) BY MOUTH DAILY.   Melatonin 10 MG Tabs Take 10 mg by mouth at bedtime.   memantine 10 MG tablet Commonly known as: NAMENDA Take 1 tablet (10 mg total) by mouth 2 (two) times daily. TAKE 10 mg TABLET BY MOUTH 2 TIMES DAILY.   methocarbamol 500 MG tablet Commonly known as: ROBAXIN Take 1 tablet (500 mg total) by mouth every 6 (six) hours as needed for muscle spasms.   oxyCODONE-acetaminophen 5-325 MG tablet Commonly known as: PERCOCET/ROXICET Take 1-2 tablets by mouth every 6 (six) hours as needed for moderate pain (pain score 4-6) or severe pain (pain score 7-10).   pantoprazole 40 MG tablet Commonly known as: PROTONIX TAKE 1 TABLET BY MOUTH DAILY. What changed: when to take this   PATADAY OP Place 1 drop into both eyes 2 (two) times daily as needed (allergies).   pregabalin 75 MG capsule Commonly known as: LYRICA Take 75 mg by mouth 2 (two) times daily.   propranolol 40 MG tablet Commonly known as: INDERAL Take 1 tablet (40 mg total) by mouth 2 (two) times daily.   Restasis 0.05 % ophthalmic emulsion Generic drug: cycloSPORINE Place 1 drop into both eyes 2 (two) times daily.   rOPINIRole 0.5 MG tablet Commonly known as: REQUIP Take 0.5 mg by mouth at bedtime as needed (restless legs).   rosuvastatin 5 MG tablet Commonly known as: CRESTOR Take 1 tablet (5 mg total) by mouth daily. What changed: when to take this   traZODone 100 MG tablet Commonly known as: DESYREL Take 200 mg by mouth at bedtime.   valACYclovir 500 MG  tablet Commonly known as: VALTREX TAKE 1 TABLET BY MOUTH TWICE A DAY FOR 5 DAYS What changed: See the new instructions.   venlafaxine XR 150 MG 24 hr capsule Commonly known as: EFFEXOR-XR Take 300 mg by mouth daily with breakfast.        Follow-up Information     [facility] Follow up.   Specialty: Home Health Services Why: The home health agency will contact you for the first home visit Contact information: [address] [place] [address] [phone]                 Signed: Stefani Dama 01/05/2023, 10:48 AM

## 2023-01-07 DIAGNOSIS — M5116 Intervertebral disc disorders with radiculopathy, lumbar region: Secondary | ICD-10-CM | POA: Diagnosis not present

## 2023-01-07 DIAGNOSIS — I4891 Unspecified atrial fibrillation: Secondary | ICD-10-CM | POA: Diagnosis not present

## 2023-01-07 DIAGNOSIS — F419 Anxiety disorder, unspecified: Secondary | ICD-10-CM | POA: Diagnosis not present

## 2023-01-07 DIAGNOSIS — M48061 Spinal stenosis, lumbar region without neurogenic claudication: Secondary | ICD-10-CM | POA: Diagnosis not present

## 2023-01-07 DIAGNOSIS — M5126 Other intervertebral disc displacement, lumbar region: Secondary | ICD-10-CM | POA: Diagnosis not present

## 2023-01-07 DIAGNOSIS — M1990 Unspecified osteoarthritis, unspecified site: Secondary | ICD-10-CM | POA: Diagnosis not present

## 2023-01-07 DIAGNOSIS — J439 Emphysema, unspecified: Secondary | ICD-10-CM | POA: Diagnosis not present

## 2023-01-07 DIAGNOSIS — M4726 Other spondylosis with radiculopathy, lumbar region: Secondary | ICD-10-CM | POA: Diagnosis not present

## 2023-01-07 DIAGNOSIS — Z4789 Encounter for other orthopedic aftercare: Secondary | ICD-10-CM | POA: Diagnosis not present

## 2023-01-10 ENCOUNTER — Telehealth: Payer: Self-pay | Admitting: Internal Medicine

## 2023-01-10 NOTE — Telephone Encounter (Signed)
Copied from CRM [id number]. Topic: Clinical - Home Health Verbal Orders >> Jan 10, 2023  2:18 PM Desma Mcgregor wrote: Caller/Agency: [facility] Callback Number: [phone] Service Requested: Physical Therapy Frequency: 1w1 2w3 1w5 Any new concerns about the patient? No

## 2023-01-10 NOTE — Telephone Encounter (Signed)
Called Megan Williams back with [facility] to let her know she needs to contact surgeons office for order on physical therapy. That this PT is needed from her recent hospital stay and surgery.

## 2023-01-11 DIAGNOSIS — M1990 Unspecified osteoarthritis, unspecified site: Secondary | ICD-10-CM | POA: Diagnosis not present

## 2023-01-11 DIAGNOSIS — I4891 Unspecified atrial fibrillation: Secondary | ICD-10-CM | POA: Diagnosis not present

## 2023-01-11 DIAGNOSIS — Z4789 Encounter for other orthopedic aftercare: Secondary | ICD-10-CM | POA: Diagnosis not present

## 2023-01-11 DIAGNOSIS — M5116 Intervertebral disc disorders with radiculopathy, lumbar region: Secondary | ICD-10-CM | POA: Diagnosis not present

## 2023-01-11 DIAGNOSIS — F419 Anxiety disorder, unspecified: Secondary | ICD-10-CM | POA: Diagnosis not present

## 2023-01-11 DIAGNOSIS — M5126 Other intervertebral disc displacement, lumbar region: Secondary | ICD-10-CM | POA: Diagnosis not present

## 2023-01-11 DIAGNOSIS — M48061 Spinal stenosis, lumbar region without neurogenic claudication: Secondary | ICD-10-CM | POA: Diagnosis not present

## 2023-01-11 DIAGNOSIS — M4726 Other spondylosis with radiculopathy, lumbar region: Secondary | ICD-10-CM | POA: Diagnosis not present

## 2023-01-11 DIAGNOSIS — J439 Emphysema, unspecified: Secondary | ICD-10-CM | POA: Diagnosis not present

## 2023-01-14 DIAGNOSIS — M1990 Unspecified osteoarthritis, unspecified site: Secondary | ICD-10-CM | POA: Diagnosis not present

## 2023-01-14 DIAGNOSIS — M5126 Other intervertebral disc displacement, lumbar region: Secondary | ICD-10-CM | POA: Diagnosis not present

## 2023-01-14 DIAGNOSIS — I4891 Unspecified atrial fibrillation: Secondary | ICD-10-CM | POA: Diagnosis not present

## 2023-01-14 DIAGNOSIS — M5116 Intervertebral disc disorders with radiculopathy, lumbar region: Secondary | ICD-10-CM | POA: Diagnosis not present

## 2023-01-14 DIAGNOSIS — F419 Anxiety disorder, unspecified: Secondary | ICD-10-CM | POA: Diagnosis not present

## 2023-01-14 DIAGNOSIS — M4726 Other spondylosis with radiculopathy, lumbar region: Secondary | ICD-10-CM | POA: Diagnosis not present

## 2023-01-14 DIAGNOSIS — M48061 Spinal stenosis, lumbar region without neurogenic claudication: Secondary | ICD-10-CM | POA: Diagnosis not present

## 2023-01-14 DIAGNOSIS — Z4789 Encounter for other orthopedic aftercare: Secondary | ICD-10-CM | POA: Diagnosis not present

## 2023-01-14 DIAGNOSIS — J439 Emphysema, unspecified: Secondary | ICD-10-CM | POA: Diagnosis not present

## 2023-01-17 ENCOUNTER — Ambulatory Visit: Payer: Medicare HMO

## 2023-01-18 DIAGNOSIS — M5126 Other intervertebral disc displacement, lumbar region: Secondary | ICD-10-CM | POA: Diagnosis not present

## 2023-01-18 DIAGNOSIS — M1990 Unspecified osteoarthritis, unspecified site: Secondary | ICD-10-CM | POA: Diagnosis not present

## 2023-01-18 DIAGNOSIS — J439 Emphysema, unspecified: Secondary | ICD-10-CM | POA: Diagnosis not present

## 2023-01-18 DIAGNOSIS — F419 Anxiety disorder, unspecified: Secondary | ICD-10-CM | POA: Diagnosis not present

## 2023-01-18 DIAGNOSIS — M48061 Spinal stenosis, lumbar region without neurogenic claudication: Secondary | ICD-10-CM | POA: Diagnosis not present

## 2023-01-18 DIAGNOSIS — M4726 Other spondylosis with radiculopathy, lumbar region: Secondary | ICD-10-CM | POA: Diagnosis not present

## 2023-01-18 DIAGNOSIS — I4891 Unspecified atrial fibrillation: Secondary | ICD-10-CM | POA: Diagnosis not present

## 2023-01-18 DIAGNOSIS — M5116 Intervertebral disc disorders with radiculopathy, lumbar region: Secondary | ICD-10-CM | POA: Diagnosis not present

## 2023-01-18 DIAGNOSIS — Z4789 Encounter for other orthopedic aftercare: Secondary | ICD-10-CM | POA: Diagnosis not present

## 2023-01-19 DIAGNOSIS — M48062 Spinal stenosis, lumbar region with neurogenic claudication: Secondary | ICD-10-CM | POA: Diagnosis not present

## 2023-01-20 ENCOUNTER — Other Ambulatory Visit: Payer: Self-pay | Admitting: Internal Medicine

## 2023-01-21 DIAGNOSIS — M5116 Intervertebral disc disorders with radiculopathy, lumbar region: Secondary | ICD-10-CM | POA: Diagnosis not present

## 2023-01-21 DIAGNOSIS — M4726 Other spondylosis with radiculopathy, lumbar region: Secondary | ICD-10-CM | POA: Diagnosis not present

## 2023-01-21 DIAGNOSIS — J439 Emphysema, unspecified: Secondary | ICD-10-CM | POA: Diagnosis not present

## 2023-01-21 DIAGNOSIS — F419 Anxiety disorder, unspecified: Secondary | ICD-10-CM | POA: Diagnosis not present

## 2023-01-21 DIAGNOSIS — Z4789 Encounter for other orthopedic aftercare: Secondary | ICD-10-CM | POA: Diagnosis not present

## 2023-01-21 DIAGNOSIS — M48061 Spinal stenosis, lumbar region without neurogenic claudication: Secondary | ICD-10-CM | POA: Diagnosis not present

## 2023-01-21 DIAGNOSIS — I4891 Unspecified atrial fibrillation: Secondary | ICD-10-CM | POA: Diagnosis not present

## 2023-01-21 DIAGNOSIS — M5126 Other intervertebral disc displacement, lumbar region: Secondary | ICD-10-CM | POA: Diagnosis not present

## 2023-01-21 DIAGNOSIS — M1990 Unspecified osteoarthritis, unspecified site: Secondary | ICD-10-CM | POA: Diagnosis not present

## 2023-01-24 DIAGNOSIS — J439 Emphysema, unspecified: Secondary | ICD-10-CM | POA: Diagnosis not present

## 2023-01-24 DIAGNOSIS — M5126 Other intervertebral disc displacement, lumbar region: Secondary | ICD-10-CM | POA: Diagnosis not present

## 2023-01-24 DIAGNOSIS — Z4789 Encounter for other orthopedic aftercare: Secondary | ICD-10-CM | POA: Diagnosis not present

## 2023-01-24 DIAGNOSIS — M48061 Spinal stenosis, lumbar region without neurogenic claudication: Secondary | ICD-10-CM | POA: Diagnosis not present

## 2023-01-24 DIAGNOSIS — M1990 Unspecified osteoarthritis, unspecified site: Secondary | ICD-10-CM | POA: Diagnosis not present

## 2023-01-24 DIAGNOSIS — M5116 Intervertebral disc disorders with radiculopathy, lumbar region: Secondary | ICD-10-CM | POA: Diagnosis not present

## 2023-01-24 DIAGNOSIS — I4891 Unspecified atrial fibrillation: Secondary | ICD-10-CM | POA: Diagnosis not present

## 2023-01-24 DIAGNOSIS — M4726 Other spondylosis with radiculopathy, lumbar region: Secondary | ICD-10-CM | POA: Diagnosis not present

## 2023-01-24 DIAGNOSIS — F419 Anxiety disorder, unspecified: Secondary | ICD-10-CM | POA: Diagnosis not present

## 2023-01-25 DIAGNOSIS — T162XXA Foreign body in left ear, initial encounter: Secondary | ICD-10-CM | POA: Diagnosis not present

## 2023-01-25 DIAGNOSIS — H903 Sensorineural hearing loss, bilateral: Secondary | ICD-10-CM | POA: Diagnosis not present

## 2023-01-26 DIAGNOSIS — M5126 Other intervertebral disc displacement, lumbar region: Secondary | ICD-10-CM | POA: Diagnosis not present

## 2023-01-26 DIAGNOSIS — J439 Emphysema, unspecified: Secondary | ICD-10-CM | POA: Diagnosis not present

## 2023-01-26 DIAGNOSIS — M5116 Intervertebral disc disorders with radiculopathy, lumbar region: Secondary | ICD-10-CM | POA: Diagnosis not present

## 2023-01-26 DIAGNOSIS — I4891 Unspecified atrial fibrillation: Secondary | ICD-10-CM | POA: Diagnosis not present

## 2023-01-26 DIAGNOSIS — M4726 Other spondylosis with radiculopathy, lumbar region: Secondary | ICD-10-CM | POA: Diagnosis not present

## 2023-01-26 DIAGNOSIS — F419 Anxiety disorder, unspecified: Secondary | ICD-10-CM | POA: Diagnosis not present

## 2023-01-26 DIAGNOSIS — M48061 Spinal stenosis, lumbar region without neurogenic claudication: Secondary | ICD-10-CM | POA: Diagnosis not present

## 2023-01-26 DIAGNOSIS — M1990 Unspecified osteoarthritis, unspecified site: Secondary | ICD-10-CM | POA: Diagnosis not present

## 2023-01-26 DIAGNOSIS — Z4789 Encounter for other orthopedic aftercare: Secondary | ICD-10-CM | POA: Diagnosis not present

## 2023-02-01 DIAGNOSIS — M1990 Unspecified osteoarthritis, unspecified site: Secondary | ICD-10-CM | POA: Diagnosis not present

## 2023-02-01 DIAGNOSIS — Z4789 Encounter for other orthopedic aftercare: Secondary | ICD-10-CM | POA: Diagnosis not present

## 2023-02-01 DIAGNOSIS — M4726 Other spondylosis with radiculopathy, lumbar region: Secondary | ICD-10-CM | POA: Diagnosis not present

## 2023-02-01 DIAGNOSIS — I4891 Unspecified atrial fibrillation: Secondary | ICD-10-CM | POA: Diagnosis not present

## 2023-02-01 DIAGNOSIS — J439 Emphysema, unspecified: Secondary | ICD-10-CM | POA: Diagnosis not present

## 2023-02-01 DIAGNOSIS — M5116 Intervertebral disc disorders with radiculopathy, lumbar region: Secondary | ICD-10-CM | POA: Diagnosis not present

## 2023-02-01 DIAGNOSIS — M5126 Other intervertebral disc displacement, lumbar region: Secondary | ICD-10-CM | POA: Diagnosis not present

## 2023-02-01 DIAGNOSIS — F419 Anxiety disorder, unspecified: Secondary | ICD-10-CM | POA: Diagnosis not present

## 2023-02-01 DIAGNOSIS — M48061 Spinal stenosis, lumbar region without neurogenic claudication: Secondary | ICD-10-CM | POA: Diagnosis not present

## 2023-02-06 DIAGNOSIS — I4891 Unspecified atrial fibrillation: Secondary | ICD-10-CM | POA: Diagnosis not present

## 2023-02-06 DIAGNOSIS — F419 Anxiety disorder, unspecified: Secondary | ICD-10-CM | POA: Diagnosis not present

## 2023-02-06 DIAGNOSIS — M1990 Unspecified osteoarthritis, unspecified site: Secondary | ICD-10-CM | POA: Diagnosis not present

## 2023-02-06 DIAGNOSIS — J439 Emphysema, unspecified: Secondary | ICD-10-CM | POA: Diagnosis not present

## 2023-02-06 DIAGNOSIS — Z4789 Encounter for other orthopedic aftercare: Secondary | ICD-10-CM | POA: Diagnosis not present

## 2023-02-06 DIAGNOSIS — M5126 Other intervertebral disc displacement, lumbar region: Secondary | ICD-10-CM | POA: Diagnosis not present

## 2023-02-06 DIAGNOSIS — M5116 Intervertebral disc disorders with radiculopathy, lumbar region: Secondary | ICD-10-CM | POA: Diagnosis not present

## 2023-02-06 DIAGNOSIS — M48061 Spinal stenosis, lumbar region without neurogenic claudication: Secondary | ICD-10-CM | POA: Diagnosis not present

## 2023-02-06 DIAGNOSIS — M4726 Other spondylosis with radiculopathy, lumbar region: Secondary | ICD-10-CM | POA: Diagnosis not present

## 2023-02-09 DIAGNOSIS — M4726 Other spondylosis with radiculopathy, lumbar region: Secondary | ICD-10-CM | POA: Diagnosis not present

## 2023-02-09 DIAGNOSIS — Z4789 Encounter for other orthopedic aftercare: Secondary | ICD-10-CM | POA: Diagnosis not present

## 2023-02-09 DIAGNOSIS — M48061 Spinal stenosis, lumbar region without neurogenic claudication: Secondary | ICD-10-CM | POA: Diagnosis not present

## 2023-02-09 DIAGNOSIS — M5126 Other intervertebral disc displacement, lumbar region: Secondary | ICD-10-CM | POA: Diagnosis not present

## 2023-02-09 DIAGNOSIS — M5116 Intervertebral disc disorders with radiculopathy, lumbar region: Secondary | ICD-10-CM | POA: Diagnosis not present

## 2023-02-09 DIAGNOSIS — F419 Anxiety disorder, unspecified: Secondary | ICD-10-CM | POA: Diagnosis not present

## 2023-02-09 DIAGNOSIS — M1990 Unspecified osteoarthritis, unspecified site: Secondary | ICD-10-CM | POA: Diagnosis not present

## 2023-02-09 DIAGNOSIS — J439 Emphysema, unspecified: Secondary | ICD-10-CM | POA: Diagnosis not present

## 2023-02-09 DIAGNOSIS — I4891 Unspecified atrial fibrillation: Secondary | ICD-10-CM | POA: Diagnosis not present

## 2023-02-14 ENCOUNTER — Other Ambulatory Visit: Payer: Self-pay | Admitting: Internal Medicine

## 2023-02-14 DIAGNOSIS — B009 Herpesviral infection, unspecified: Secondary | ICD-10-CM

## 2023-02-15 ENCOUNTER — Other Ambulatory Visit: Payer: Self-pay | Admitting: Internal Medicine

## 2023-02-16 DIAGNOSIS — J439 Emphysema, unspecified: Secondary | ICD-10-CM | POA: Diagnosis not present

## 2023-02-16 DIAGNOSIS — I4891 Unspecified atrial fibrillation: Secondary | ICD-10-CM | POA: Diagnosis not present

## 2023-02-16 DIAGNOSIS — M1990 Unspecified osteoarthritis, unspecified site: Secondary | ICD-10-CM | POA: Diagnosis not present

## 2023-02-16 DIAGNOSIS — F419 Anxiety disorder, unspecified: Secondary | ICD-10-CM | POA: Diagnosis not present

## 2023-02-16 DIAGNOSIS — M5126 Other intervertebral disc displacement, lumbar region: Secondary | ICD-10-CM | POA: Diagnosis not present

## 2023-02-16 DIAGNOSIS — Z4789 Encounter for other orthopedic aftercare: Secondary | ICD-10-CM | POA: Diagnosis not present

## 2023-02-16 DIAGNOSIS — M5116 Intervertebral disc disorders with radiculopathy, lumbar region: Secondary | ICD-10-CM | POA: Diagnosis not present

## 2023-02-16 DIAGNOSIS — M4726 Other spondylosis with radiculopathy, lumbar region: Secondary | ICD-10-CM | POA: Diagnosis not present

## 2023-02-16 DIAGNOSIS — M48061 Spinal stenosis, lumbar region without neurogenic claudication: Secondary | ICD-10-CM | POA: Diagnosis not present

## 2023-02-17 ENCOUNTER — Ambulatory Visit: Payer: Medicare HMO

## 2023-02-21 DIAGNOSIS — J439 Emphysema, unspecified: Secondary | ICD-10-CM | POA: Diagnosis not present

## 2023-02-21 DIAGNOSIS — I4891 Unspecified atrial fibrillation: Secondary | ICD-10-CM | POA: Diagnosis not present

## 2023-02-21 DIAGNOSIS — M4726 Other spondylosis with radiculopathy, lumbar region: Secondary | ICD-10-CM | POA: Diagnosis not present

## 2023-02-21 DIAGNOSIS — F419 Anxiety disorder, unspecified: Secondary | ICD-10-CM | POA: Diagnosis not present

## 2023-02-21 DIAGNOSIS — M1990 Unspecified osteoarthritis, unspecified site: Secondary | ICD-10-CM | POA: Diagnosis not present

## 2023-02-21 DIAGNOSIS — M48061 Spinal stenosis, lumbar region without neurogenic claudication: Secondary | ICD-10-CM | POA: Diagnosis not present

## 2023-02-21 DIAGNOSIS — M5126 Other intervertebral disc displacement, lumbar region: Secondary | ICD-10-CM | POA: Diagnosis not present

## 2023-02-21 DIAGNOSIS — Z4789 Encounter for other orthopedic aftercare: Secondary | ICD-10-CM | POA: Diagnosis not present

## 2023-02-21 DIAGNOSIS — M5116 Intervertebral disc disorders with radiculopathy, lumbar region: Secondary | ICD-10-CM | POA: Diagnosis not present

## 2023-02-28 DIAGNOSIS — Z4789 Encounter for other orthopedic aftercare: Secondary | ICD-10-CM | POA: Diagnosis not present

## 2023-02-28 DIAGNOSIS — M4726 Other spondylosis with radiculopathy, lumbar region: Secondary | ICD-10-CM | POA: Diagnosis not present

## 2023-02-28 DIAGNOSIS — M5126 Other intervertebral disc displacement, lumbar region: Secondary | ICD-10-CM | POA: Diagnosis not present

## 2023-02-28 DIAGNOSIS — M1990 Unspecified osteoarthritis, unspecified site: Secondary | ICD-10-CM | POA: Diagnosis not present

## 2023-02-28 DIAGNOSIS — I4891 Unspecified atrial fibrillation: Secondary | ICD-10-CM | POA: Diagnosis not present

## 2023-02-28 DIAGNOSIS — M5116 Intervertebral disc disorders with radiculopathy, lumbar region: Secondary | ICD-10-CM | POA: Diagnosis not present

## 2023-02-28 DIAGNOSIS — F419 Anxiety disorder, unspecified: Secondary | ICD-10-CM | POA: Diagnosis not present

## 2023-02-28 DIAGNOSIS — J439 Emphysema, unspecified: Secondary | ICD-10-CM | POA: Diagnosis not present

## 2023-02-28 DIAGNOSIS — M48061 Spinal stenosis, lumbar region without neurogenic claudication: Secondary | ICD-10-CM | POA: Diagnosis not present

## 2023-03-09 DIAGNOSIS — M47816 Spondylosis without myelopathy or radiculopathy, lumbar region: Secondary | ICD-10-CM | POA: Diagnosis not present

## 2023-03-09 DIAGNOSIS — M5412 Radiculopathy, cervical region: Secondary | ICD-10-CM | POA: Diagnosis not present

## 2023-03-24 ENCOUNTER — Ambulatory Visit: Payer: Self-pay | Admitting: Internal Medicine

## 2023-03-24 ENCOUNTER — Encounter: Payer: Self-pay | Admitting: Internal Medicine

## 2023-03-24 NOTE — Telephone Encounter (Signed)
Chief Complaint: headache and dizziness Symptoms: 6/10 headache, dizziness, more sleepy than usual Frequency: fall on Saturday, symptoms began two days later Pertinent Negatives: Patient denies N/V, blurry vision, difficulty walking, bleeding Disposition: [x] ED /[] Urgent Care (no appt availability in office) / [] Appointment(In office/virtual)/ []  [facility]/ [] Home Care/ [x] Refused Recommended Disposition /[] [facility]/ []  Follow-up with PCP  Additional Notes: This pt reports a mechanical fall on Saturday in which she fell face first into a wall. Pt states she was wearing slippers which got caught on the carpet, causing her to fall forward and hit her face on the wall in front of her. Pt denies LOC at that time. Pt reports she was asymptomatic until two days after when she developed a 6/10 pounding headache and dizziness. Pt states she has been taking 2 650mg  Tylenol with good effect, but still feels dizzy. Pt states she napped for 3 hours today which is unusual for her. Pt denies bleeding currently, but states she has a scab over her right eyebrow and had a cut on her knee which she placed a band-aid on. Pt is alert and oriented while conversing on the phone with this RN. Other than a daily aspirin, pt denies taking blood thinners. Pt states she had a tetanus booster in Spring of 2024 after a dog bite. Currently pt denies N/V, blurry vision, difficulty walking. Pt reports her headache has resolved since she last took Tylenol. Per protocol, this RN advised that pt be seen at the ED. Pt declined going to the ED. This RN called Dr. Beryle Quant CAL to let staff know that pt declined going to the ED. This RN routed the encounter high priority and advised pt that RN will route this event to the appropriate people for follow-up. Pt advised to call back for any worsening symptoms, like N/V, sudden, severe worsening headache, dizziness, lethargy. Pt verbalized understanding.   Copied from CRM  [id number]. Topic: Clinical - Red Word Triage >> Mar 24, 2023  4:37 PM Elle L wrote: Red Word that prompted transfer to Nurse Triage: The patient fell face first into a wall on Saturday afternoon and hit her head and her right hip. She remained concious but is dizzy now and has a headache when she wakes up. Reason for Disposition  [1] SEVERE headache AND [2] not improved 2 hours after pain medicine/ice packs  Answer Assessment - Initial Assessment Questions 1. MECHANISM: "How did the injury happen?" For falls, ask: "What height did you fall from?" and "What surface did you fall against?"      Fell face first into a wall on Saturday. "I crammed the top of my head right into the wall." "I had my slippers on, came out of the bathroom, and my slippers stuck to the carpet." 2. ONSET: "When did the injury happen?" (Minutes or hours ago)      Saturday 3. NEUROLOGIC SYMPTOMS: "Was there any loss of consciousness?" "Are there any other neurological symptoms?"      No LOC 4. MENTAL STATUS: "Does the person know who they are, who you are, and where they are?"      Yes, pt is alert and oriented 5. LOCATION: "What part of the head was hit?"      Face first 6. SCALP APPEARANCE: "What does the scalp look like? Is it bleeding now?" If Yes, ask: "Is it difficult to stop?"      Scab over right eyebrow/eye - no bleeding 7. SIZE: For cuts, bruises, or swelling,  ask: "How large is it?" (e.g., inches or centimeters)      Scab over right eyebrow/eye, scrap to the knee 8. PAIN: "Is there any pain?" If Yes, ask: "How bad is it?"  (e.g., Scale 1-10; or mild, moderate, severe)     6/10 headache pain  9. TETANUS: For any breaks in the skin, ask: "When was the last tetanus booster?"     "I had a tetanus shot back in the spring - 2024" 10. OTHER SYMPTOMS: "Do you have any other symptoms?" (e.g., neck pain, vomiting)       Pounding headache for 2 days (took 2 650mg  Tylenol with relief), dizziness, sleeping most of today. No  thinners except for daily aspirin, no N/V, no blurry vision.  Protocols used: Head Injury-A-AH

## 2023-03-25 ENCOUNTER — Other Ambulatory Visit: Payer: Self-pay

## 2023-03-25 ENCOUNTER — Encounter: Payer: Self-pay | Admitting: Internal Medicine

## 2023-03-25 MED ORDER — ROSUVASTATIN CALCIUM 5 MG PO TABS: 5.0000 mg | ORAL_TABLET | Freq: Every day | ORAL | 3 refills | Status: DC

## 2023-03-25 NOTE — Telephone Encounter (Signed)
Statin sent to patient's pharmacy.

## 2023-03-28 NOTE — Telephone Encounter (Signed)
Patient never did go to emergency room, do we need to bring her in for office visit?

## 2023-04-20 ENCOUNTER — Other Ambulatory Visit: Payer: Self-pay | Admitting: Internal Medicine

## 2023-04-25 ENCOUNTER — Other Ambulatory Visit: Payer: PPO

## 2023-04-25 DIAGNOSIS — Z79899 Other long term (current) drug therapy: Secondary | ICD-10-CM | POA: Diagnosis not present

## 2023-04-25 DIAGNOSIS — Z5181 Encounter for therapeutic drug level monitoring: Secondary | ICD-10-CM | POA: Diagnosis not present

## 2023-04-26 LAB — HEPATIC FUNCTION PANEL
AG Ratio: 1.5 (calc) (ref 1.0–2.5)
ALT: 9 U/L (ref 6–29)
AST: 18 U/L (ref 10–35)
Albumin: 3.9 g/dL (ref 3.6–5.1)
Alkaline phosphatase (APISO): 49 U/L (ref 37–153)
Bilirubin, Direct: 0.1 mg/dL (ref 0.0–0.2)
Globulin: 2.6 g/dL (ref 1.9–3.7)
Indirect Bilirubin: 0.2 mg/dL (ref 0.2–1.2)
Total Bilirubin: 0.3 mg/dL (ref 0.2–1.2)
Total Protein: 6.5 g/dL (ref 6.1–8.1)

## 2023-04-26 LAB — LIPID PANEL
Cholesterol: 130 mg/dL (ref <200)
HDL: 51 mg/dL (ref >=50)
LDL Cholesterol (Calc): 56 mg/dL
Non-HDL Cholesterol (Calc): 79 mg/dL (ref <130)
Total CHOL/HDL Ratio: 2.5 (calc) (ref <5.0)
Triglycerides: 157 mg/dL — ABNORMAL HIGH (ref <150)

## 2023-04-26 LAB — TSH: TSH: 0.54 m[IU]/L (ref 0.40–4.50)

## 2023-04-29 ENCOUNTER — Encounter: Payer: Self-pay | Admitting: Internal Medicine

## 2023-04-29 ENCOUNTER — Ambulatory Visit (INDEPENDENT_AMBULATORY_CARE_PROVIDER_SITE_OTHER): Payer: PPO | Admitting: Internal Medicine

## 2023-04-29 VITALS — BP 120/60 | HR 70 | Ht 62.5 in | Wt 121.0 lb

## 2023-04-29 DIAGNOSIS — H903 Sensorineural hearing loss, bilateral: Secondary | ICD-10-CM | POA: Diagnosis not present

## 2023-04-29 DIAGNOSIS — I10 Essential (primary) hypertension: Secondary | ICD-10-CM | POA: Diagnosis not present

## 2023-04-29 DIAGNOSIS — M48062 Spinal stenosis, lumbar region with neurogenic claudication: Secondary | ICD-10-CM | POA: Diagnosis not present

## 2023-04-29 DIAGNOSIS — E039 Hypothyroidism, unspecified: Secondary | ICD-10-CM

## 2023-04-29 DIAGNOSIS — R413 Other amnesia: Secondary | ICD-10-CM | POA: Diagnosis not present

## 2023-04-29 DIAGNOSIS — Z9889 Other specified postprocedural states: Secondary | ICD-10-CM | POA: Diagnosis not present

## 2023-04-29 DIAGNOSIS — K219 Gastro-esophageal reflux disease without esophagitis: Secondary | ICD-10-CM

## 2023-04-29 DIAGNOSIS — F32A Depression, unspecified: Secondary | ICD-10-CM | POA: Diagnosis not present

## 2023-04-29 DIAGNOSIS — F419 Anxiety disorder, unspecified: Secondary | ICD-10-CM

## 2023-04-29 DIAGNOSIS — F439 Reaction to severe stress, unspecified: Secondary | ICD-10-CM | POA: Diagnosis not present

## 2023-04-29 DIAGNOSIS — G250 Essential tremor: Secondary | ICD-10-CM | POA: Diagnosis not present

## 2023-04-29 DIAGNOSIS — G3184 Mild cognitive impairment, so stated: Secondary | ICD-10-CM

## 2023-04-29 DIAGNOSIS — M170 Bilateral primary osteoarthritis of knee: Secondary | ICD-10-CM | POA: Diagnosis not present

## 2023-04-29 DIAGNOSIS — M8580 Other specified disorders of bone density and structure, unspecified site: Secondary | ICD-10-CM

## 2023-04-29 DIAGNOSIS — Z8679 Personal history of other diseases of the circulatory system: Secondary | ICD-10-CM

## 2023-04-29 NOTE — Progress Notes (Addendum)
 Patient Care Team: Margaree Mackintosh, MD as PCP - General (Internal Medicine) Christell Constant, MD as PCP - Cardiology (Cardiology) Olivia Mackie, NP as Nurse Practitioner (Gynecology)  Visit Date: 04/29/23  Subjective:   Chief Complaint  Patient presents with   Medical Management of Chronic Issues  Patient QM:VHQIO S Neiswonger,Female DOB:March 24, 1945,77 y.o. NGE:[id number]   78 y.o. Female presents today for 6 months follow-up for Hypothyroidism, HLD, and Osteopenia. Patient has a past medical history of Anxiety/Depression, Arthritis, Atrial Fibrillation, COPD, Depression, Emphysema, Esophagitis, Herpes Simplex, Hypertension, Hypothyroidism, Migraines, Osteopenia, Vitamin D Deficiency, Dementia And Mild Memory Loss. Last seen 10/28/2022 for her annual, in the interim she has seen several other providers. 10/2022 she saw Oris Drone for her cardiovascular pre-op exam, Ellis Savage for mental health, and Debroah Baller with Urology for OAB. 11/2022 she saw Ophthalmology and Optometry, and then in 12/2022 she underwent Posterior Decompression L1-L2 w/ Arthrodesis L1-L2 and subsequently was followed by [facility] every couple of days starting 1 week after her surgery until the end of the year, and did f/u with Dr. 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   History of Hypothyroidism treated with Synthroid 88 mcg daily. 04/25/2023 TSH 0.54, decreased from 2.96 on 10/21/2022.    History of Hypertension; Essential Tremor treated with Inderal 40 mg twice daily. HTN has been followed by Cardiologist, Dr. Riley Lam. Blood Pressure: normotensive today at 120/60. He would like for me to refill her meds.  History of Hyperlipidemia treated with 160 mg Fenofibrate daily and 5 mg Rosuvastatin daily. 04/25/2023 Lipid Panel with HDL improved back into normal limits from from 41 to 51;  Triglycerides 157, elevated from 137. Hepatic Function Panel WNL. Cardiologist would like for me to refill her medication.  History of Osteopenia treated with Prolia 60 mg every six months. previously Managed by Orthopedist, Dr. Albertha Ghee. T-score at -1.7 in 3/23. Now we are managing this. [facility].   History of Anxiety treated with Valium 2.5-5 mg daily as needed, Desyrel 200 mg daily at bedtime, Effexor-XR 300 mg daily with breakfast.   History of mild Memory Loss managed on 10 mg Namenda BID, followed by Margie Ege, NP with Neurology who she will see 06/02/2023.   S/p Posterior Decompression L1-L2 w/ Arthrodesis L1-L2 12/2022 for Degenerative Lumbar Spinal Stenosis; Lumbar Stenosis w/ Neurogenic Claudication. She reports that she is still experiencing pain in her back, and says that she is also having numbness in her left arm & hand. States that she follows-up with   Notes that she is planning on contacting Dr. Despina Hick for her knee pain as she only has a flare-up annually and would prefer to just receive injections instead of having it replaced.  Past Medical History:  Diagnosis Date   Allergy    Anxiety    Arthritis    Atrial fibrillation (HCC)    Complication of anesthesia    "difficulty waking up. I could hear them but I couldn't wake up"   COPD (chronic obstructive pulmonary disease) (HCC)    Depression    Emphysema    Esophagitis    GERD (gastroesophageal reflux disease)    Herpes simplex    History of hiatal hernia    Hypertension    Hypothyroidism    Migraines    Osteopenia    Vitamin D deficiency  Allergies  Allergen Reactions   Doxycycline Hives, Itching, Nausea And Vomiting and Other (See Comments)    Skin peeling, Stevens-Johnson Syndrome    Latex Rash, Hives and Itching   Morphine Itching and Other (See Comments)    Confusion, Dizziness   Penicillins Hives    All over body    Grass Extracts [Gramineae  Pollens] Cough and Itching    Other reaction(s): Eye Redness   Short Ragweed Pollen Ext Cough and Itching    Other reaction(s): Eye Redness, Other   Celebrex [Celecoxib] Other (See Comments)    Blistering, pain, swelling   Cephalosporins Hives    All over body   Codeine Nausea And Vomiting   Gabapentin     Causes TICS     Family History  Adopted: Yes  Family history unknown: Yes   Social History   Social History Narrative   Lives at home with husband.   Right-handed.   1 cup caffeine daily.   Review of Systems  Constitutional:  Negative for fever and malaise/fatigue.  HENT:  Negative for congestion.   Eyes:  Negative for blurred vision.  Respiratory:  Negative for cough and shortness of breath.   Cardiovascular:  Negative for chest pain, palpitations and leg swelling.  Gastrointestinal:  Negative for vomiting.  Musculoskeletal:  Positive for back pain and joint pain (knee).  Skin:  Negative for rash.  Neurological:  Positive for tremors (mild in hands) and sensory change (loss of sensation on back). Negative for loss of consciousness and headaches.     Objective:  Vitals: BP 120/60   Pulse 70   Ht 5' 2.5" (1.588 m)   Wt 121 lb (54.9 kg)   SpO2 96%   BMI 21.78 kg/m   Physical Exam Vitals and nursing note reviewed.  Constitutional:      General: She is not in acute distress.    Appearance: Normal appearance. She is not toxic-appearing.  HENT:     Head: Normocephalic and atraumatic.  Cardiovascular:     Rate and Rhythm: Normal rate and regular rhythm. No extrasystoles are present.    Pulses: Normal pulses.     Heart sounds: Normal heart sounds. No murmur heard.    No friction rub. No gallop.  Pulmonary:     Effort: Pulmonary effort is normal. No respiratory distress.     Breath sounds: Normal breath sounds. No wheezing or rales.  Skin:    General: Skin is warm and dry.  Neurological:     Mental Status: She is alert and oriented to person, place, and time.  Mental status is at baseline.     Motor: Tremor (mild in hands) present.  Psychiatric:        Mood and Affect: Mood normal.        Behavior: Behavior normal.        Thought Content: Thought content normal.        Judgment: Judgment normal.    Results:  Studies Obtained And Personally Reviewed By Me: Labs:     Component Value Date/Time   NA 136 01/04/2023 0825   K 4.0 01/04/2023 0825   CL 103 01/04/2023 0825   CO2 27 01/04/2023 0825   GLUCOSE 137 (H) 01/04/2023 0825   BUN 13 01/04/2023 0825   CREATININE 1.05 (H) 01/04/2023 0825   CREATININE 1.32 (H) 10/28/2022 1141   CALCIUM 9.0 01/04/2023 0825   PROT 6.5 04/25/2023 1012   ALBUMIN 4.0 10/07/2016 0925   AST 18 04/25/2023 1012  ALT 9 04/25/2023 1012   ALKPHOS 49 10/07/2016 0925   BILITOT 0.3 04/25/2023 1012   GFRNONAA 55 (L) 01/04/2023 0825   GFRNONAA 52 (L) 05/20/2020 1619   GFRAA 60 05/20/2020 1619    Lab Results  Component Value Date   WBC 13.4 (H) 01/04/2023   HGB 10.5 (L) 01/04/2023   HCT 32.2 (L) 01/04/2023   MCV 98.2 01/04/2023   PLT 226 01/04/2023   Lab Results  Component Value Date   CHOL 130 04/25/2023   HDL 51 04/25/2023   LDLCALC 56 04/25/2023   TRIG 157 (H) 04/25/2023   CHOLHDL 2.5 04/25/2023   Lab Results  Component Value Date   HGBA1C 4.9 10/07/2016    Lab Results  Component Value Date   TSH 0.54 04/25/2023   Assessment & Plan:  Hypothyroidism treated with Synthroid 88 mcg daily. 04/25/2023 TSH 0.54, decreased from 2.96 on 10/21/2022.    Hypertension; Essential Tremor treated with Inderal 40 mg twice daily. HTN followed by Cardiologist, Dr. Riley Lam. Blood Pressure: normotensive today at 120/60.   Hyperlipidemia treated with 160 mg Fenofibrate daily and 5 mg Rosuvastatin daily. 04/25/2023 Lipid Panel with HDL improved back into normal limits from from 41 to 51; Triglycerides 157, elevated from 137. Hepatic Function Panel WNL.   Osteopenia treated with Prolia 60 mg every six months.  Managed by Orthopedist, Dr. Albertha Ghee. T-score at -1.7 in 3/23.   Anxiety treated with Valium 2.5-5 mg daily as needed, Desyrel 200 mg daily at bedtime, Effexor-XR 300 mg daily with breakfast. Worried about finances.  mild Memory Loss managed on 10 mg Namenda BID, followed by Margie Ege, NP with Neurology who she will see 06/02/2023.   S/p Posterior Decompression L1-L2 w/ Arthrodesis L1-L2 12/2022 for Degenerative Lumbar Spinal Stenosis; Lumbar Stenosis w/ Neurogenic Claudication. She reports that she is still experiencing pain in her back, and says that she is also having numbness in her left arm & hand.    Notes that she is planning on contacting Dr. Despina Hick for her Knee Pain as she only has a flare-up annually and would prefer to just receive injections instead of having it replaced.     I,Emily Lagle,acting as a Neurosurgeon for Margaree Mackintosh, MD.,have documented all relevant documentation on the behalf of Margaree Mackintosh, MD,as directed by  Margaree Mackintosh, MD while in the presence of Margaree Mackintosh, MD.   I, Margaree Mackintosh, MD, have reviewed all documentation for this visit. The documentation on 04/29/23 for the exam, diagnosis, procedures, and orders are all accurate and complete.

## 2023-04-29 NOTE — Patient Instructions (Addendum)
 Patient will see Dr. Despina Hick for knee arthritis.Medicare wellness visit due in 6 months. No change in meds. Continue Prolia. BP stable. Does not need to see Cardiologist. Last bone density done March 2023.

## 2023-05-03 ENCOUNTER — Encounter: Payer: Self-pay | Admitting: Neurology

## 2023-05-06 ENCOUNTER — Other Ambulatory Visit (HOSPITAL_COMMUNITY): Payer: Self-pay

## 2023-05-06 ENCOUNTER — Other Ambulatory Visit: Payer: Self-pay

## 2023-05-06 ENCOUNTER — Telehealth: Payer: Self-pay

## 2023-05-06 DIAGNOSIS — M47816 Spondylosis without myelopathy or radiculopathy, lumbar region: Secondary | ICD-10-CM | POA: Diagnosis not present

## 2023-05-06 MED ORDER — DENOSUMAB 60 MG/ML ~~LOC~~ SOSY
60.0000 mg | PREFILLED_SYRINGE | SUBCUTANEOUS | 0 refills | Status: DC
  Filled 2023-05-09: qty 1, 180d supply, fill #0

## 2023-05-06 NOTE — Telephone Encounter (Signed)
 Pt ready for scheduling for PROLIA on or after : 05/06/23  Option# 1: Buy/Bill (Office supplied medication)  Out-of-pocket cost due at time of clinic visit: $332  Number of injection/visits approved: ---  Primary: [employer] Prolia co-insurance: 20% Admin fee co-insurance: 0%  Secondary: --- Prolia co-insurance:  Admin fee co-insurance:   Medical Benefit Details: Date Benefits were checked: 05/04/23 Deductible: NO/ Coinsurance: 20%/ Admin Fee: 0%  Prior Auth: N/A PA# Expiration Date:   # of doses approved: ----------------------------------------------------------------------- Option# 2- Med Obtained from pharmacy:  Pharmacy benefit: Copay $250 (Paid to pharmacy) Admin Fee: 0% (Pay at clinic)  Prior Auth: N/A PA# Expiration Date:   # of doses approved:   If patient wants fill through the pharmacy benefit please send prescription to: [employer], and include estimated need by date in rx notes. Pharmacy will ship medication directly to the office.  Patient NOT eligible for Prolia Copay Card. Copay Card can make patient's cost as little as $25. Link to apply: [email or web address]://[email or web address]  ** This summary of benefits is an estimation of the patient's out-of-pocket cost. Exact cost may very based on individual plan coverage.

## 2023-05-09 ENCOUNTER — Other Ambulatory Visit: Payer: Self-pay

## 2023-05-09 ENCOUNTER — Other Ambulatory Visit: Payer: Self-pay | Admitting: Pharmacy Technician

## 2023-05-09 ENCOUNTER — Other Ambulatory Visit: Payer: Self-pay | Admitting: Internal Medicine

## 2023-05-09 ENCOUNTER — Other Ambulatory Visit (HOSPITAL_COMMUNITY): Payer: Self-pay

## 2023-05-09 DIAGNOSIS — M8580 Other specified disorders of bone density and structure, unspecified site: Secondary | ICD-10-CM

## 2023-05-09 DIAGNOSIS — Z1231 Encounter for screening mammogram for malignant neoplasm of breast: Secondary | ICD-10-CM

## 2023-05-09 NOTE — Progress Notes (Signed)
 Specialty Pharmacy Initial Fill Coordination Note  Megan Williams is a 78 y.o. female contacted today regarding initial fill of specialty medication(s) Denosumab (PROLIA)   Patient requested Courier to Provider Office   Delivery date: 05/11/23   Verified address: [facility] [address]   Medication will be filled on 3/11.   Patient is aware of $250 copayment.

## 2023-05-09 NOTE — Progress Notes (Signed)
 Pharmacy Patient Advocate Encounter  Insurance verification completed.   The patient is insured through [facility] ADVANTAGE/RX ADVANCE   Ran test claim for Prolia. Currently a quantity of 1 ml is a 180 day supply and the co-pay is $250.  This test claim was processed through [facility]- copay amounts may vary at other pharmacies due to pharmacy/plan contracts, or as the patient moves through the different stages of their insurance plan.

## 2023-05-16 DIAGNOSIS — M47816 Spondylosis without myelopathy or radiculopathy, lumbar region: Secondary | ICD-10-CM | POA: Diagnosis not present

## 2023-05-17 ENCOUNTER — Telehealth: Payer: Self-pay | Admitting: Internal Medicine

## 2023-05-17 DIAGNOSIS — F3341 Major depressive disorder, recurrent, in partial remission: Secondary | ICD-10-CM | POA: Diagnosis not present

## 2023-05-17 NOTE — Telephone Encounter (Signed)
 Left a voice mail to call back and schedule an appointment for Prolia injection. Clinical visit

## 2023-05-18 NOTE — Telephone Encounter (Signed)
 LVM to CB and schedule Prolia appointment or go into MyChart and schedule when she wants to come.

## 2023-05-18 NOTE — Telephone Encounter (Signed)
 Copied from CRM [id number]. Topic: General - Call Back - No Documentation >> May 17, 2023  3:50 PM Geneva B wrote: Reason for CRM: patient is calling to schedule proliia appt please call patient back  [phone]

## 2023-05-19 DIAGNOSIS — M47816 Spondylosis without myelopathy or radiculopathy, lumbar region: Secondary | ICD-10-CM | POA: Diagnosis not present

## 2023-05-19 NOTE — Telephone Encounter (Signed)
 LVM to CB to schedule appointment for Prolia shot

## 2023-05-24 ENCOUNTER — Ambulatory Visit (INDEPENDENT_AMBULATORY_CARE_PROVIDER_SITE_OTHER): Admitting: *Deleted

## 2023-05-24 DIAGNOSIS — M47816 Spondylosis without myelopathy or radiculopathy, lumbar region: Secondary | ICD-10-CM | POA: Diagnosis not present

## 2023-05-24 DIAGNOSIS — M8580 Other specified disorders of bone density and structure, unspecified site: Secondary | ICD-10-CM

## 2023-05-24 DIAGNOSIS — N3281 Overactive bladder: Secondary | ICD-10-CM | POA: Diagnosis not present

## 2023-05-24 DIAGNOSIS — N319 Neuromuscular dysfunction of bladder, unspecified: Secondary | ICD-10-CM | POA: Diagnosis not present

## 2023-05-24 MED ORDER — DENOSUMAB 60 MG/ML ~~LOC~~ SOSY: 60.0000 mg | PREFILLED_SYRINGE | SUBCUTANEOUS | Status: DC

## 2023-05-24 MED ORDER — DENOSUMAB 60 MG/ML ~~LOC~~ SOSY
60.0000 mg | PREFILLED_SYRINGE | Freq: Once | SUBCUTANEOUS | Status: AC
  Administered 2023-05-24: 60 mg via SUBCUTANEOUS

## 2023-05-24 NOTE — Progress Notes (Unsigned)
 Patient here for prolia injection per physicians orders.  Patient supplied: Yes  Injection given left lower abdomen and patient tolerated well.

## 2023-05-26 DIAGNOSIS — M47816 Spondylosis without myelopathy or radiculopathy, lumbar region: Secondary | ICD-10-CM | POA: Diagnosis not present

## 2023-05-30 DIAGNOSIS — M47816 Spondylosis without myelopathy or radiculopathy, lumbar region: Secondary | ICD-10-CM | POA: Diagnosis not present

## 2023-06-01 DIAGNOSIS — M47816 Spondylosis without myelopathy or radiculopathy, lumbar region: Secondary | ICD-10-CM | POA: Diagnosis not present

## 2023-06-01 NOTE — Progress Notes (Unsigned)
 Patient: Megan Williams Date of Birth: 09/06/1945  Reason for Visit: Follow up History from: Patient Primary Neurologist: Dr.Yan  ASSESSMENT AND PLAN 78 y.o. year old female   1.  Recurrent episode of sudden onset vertigo, also reported couple years history of gradual onset bilateral hearing loss 2.  History of myopericarditis in September 2022, transient atrial fibrillation, was treated with IV amiodarone followed by p.o. then tapered off. 3.  Mild cognitive impairment 4.  Essential tremor  -MMSE is stable 30/30, wishes to continue Namenda, will refill -Tremor stable, continue propranolol up to 40 mg twice a day, is also on Valium from psychiatry -No further vertigo episodes, now has hearing aids that she is really enjoying -EEG was normal May 2023 -MRI of the brain with and without contrast/IAC was unremarkable May 2023 -Continue exercise, brain stimulating activities  -Follow-up in 1 year or sooner if needed  HISTORY  Megan Williams 78 years old right-handed female, seen in refer by her primary care physician Dr. 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   Around 2015, she began to notice memory trouble, she needs family to remind her multiple times, tends to forget people's name, phone number, she used to be able to remember all the congregation's name in the past, her memory trouble since 2 gradually getting worse, she still driving without getting lost   She reported history of migraine since young, for a while in September to October 2015, she has migraines almost on a daily basis, which has improved after stopped taking Trileptal   She also reported mild bilateral hands tremor since 2014, most noticeable when she holding a utensil, or write  with a pencil, she also noticed mild bilateral hands weakness, in Thanksgiving 2015, she has dropped her dishes to the floor, because of bilateral hands weakness,   Around 2015, she also noticed mild stiff unsteady gait, worsening urinary urgency, she denies significant neck pain, complains of moderate low back pain, she denies bilateral upper or lower extremity paresthesia   She is adopted, does not know family history, none of her children has tremor   MRI brain film in November 2016, mild generalized atrophy, mild supratentorium small vessel disease,    MRI of the cervical spine showed evidence of previous fusion from C4-7, 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   Laboratory evaluation failed to demonstrate treatable etiology, A1c was 4.9, normal B12, TSH   Her memory loss has been stable, recent visit in separate March 2023 Mini-Mental status was 30/30,  She continues to be on treatment for depression anxiety, including Effexor 300 mg daily, trazodone 200 mg at bedtime  She was admitted to the hospital in September 2022 for acute onset of chest pain, chest pressure, was diagnosed with acute myopericarditis, echo showed pericardial effusion, IVC collapse, small pericardial effusion measuring 0.82 cm, posterior to left ventricle, with significantly elevated ESR 117, C-reactive protein 23.5, there was also short duration of atrial fibrillation with rapid ventricular rate, IV load amiodarone, then transition to p.o., now no longer taking it, she was treated with colchicine, high-dose of ibuprofen up to 600 mg 3 times daily,   And following up with cardiology December 2022, she was asymptomatic, colchicine was stopped, tapered off  ibuprofen   She had a history of 1 episode of sudden onset of vertigo more than 20 years ago, has been vertigo free for all those years  Since her myopericarditis in September 2022, she experienced multiple recurrent short lasting  episode of vertigo, most recent 1 was on May 26, 2021, while driving, she had sudden onset of spinning sensation, difficult to control her vehicle, she was able to manage pullover, symptoms last for few minutes, then resolved, she was able to finish her office visit for Prolia injection, 45 minutes later, she had another episode of longer sudden onset of vertigo, has to close her eyes, could not drive, no loss of consciousness,  She does complains of gradual onset bilateral hearing loss, TV volume has to be turned up from 12-18 now, she denies gait abnormality  Update November 05, 2021 SS: saw ENT, audiology testing, 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  Low suspicion for BPPV. No further episodes of vertigo. Went 1 time to vestibular rehab, stopped because the problem resolved. Tremor doing good, intermittently, takes propranolol 40 mg up to twice daily PRN, some days none at all. Doing overall well. On aspirin 81 mg.   Update June 01, 2022 SS: MMSE 30/30 today. Feels memory is fine, no major changes. Sometimes has to ask her husband the same question. Remains on namenda. Tremor is stable, she did take 1/2 tablet of Valium before coming here, from psychiatry, takes propranolol 40 mg BID, it helps tremor a lot. Getting around fine. No vertigo issues. Chronic neck/back issues, sees Dr. Danielle Dess, gets 3 ESI a year. Takes aspirin 81 mg daily. TSH was low 0.31. now has hearing aides, since November, noticed big improvement.   Update 06/02/23 SS: MMSE 30/30. Only taking propranolol 40 mg once a day. Tremor in hands, stable, good and bad days, hand writing is bad. On Namenda 10 mg BID. Feels memory is doing good. No changes, forgets some short term memory. L1L2 fusion with Dr. Danielle Dess in Nov, tingling to left arm/hand. Has pain in thoracic and lumbar region, left buttocks, groin. Doing PT/dry needling, not feeling much improvement. On Valium from psych, has been increased  to 10 mg 3 times a day, due to anxiety at night.   REVIEW OF SYSTEMS: Out of a complete 14 system review of symptoms, the patient complains only of the following symptoms, and all other reviewed systems are negative.  See HPI  ALLERGIES: Allergies  Allergen Reactions   Doxycycline Hives, Itching, Nausea And Vomiting and Other (See Comments)    Skin peeling, Stevens-Johnson Syndrome    Latex Rash, Hives and Itching   Morphine Itching and Other (See Comments)    Confusion, Dizziness   Penicillins Hives    All over body    Grass Extracts [Gramineae Pollens] Cough and Itching    Other reaction(s): Eye Redness   Short Ragweed Pollen Ext Cough and Itching    Other reaction(s): Eye Redness, Other   Celebrex [Celecoxib] Other (See Comments)    Blistering, pain, swelling   Cephalosporins Hives    All over body   Codeine Nausea And Vomiting   Gabapentin     Causes TICS     HOME MEDICATIONS: Outpatient Medications Prior to Visit  Medication Sig Dispense Refill   acetaminophen (TYLENOL) 650 MG CR tablet Take 1,300 mg by mouth 2 (two) times daily.     ARTIFICIAL TEAR SOLUTION OP Place 1 drop into both eyes 4 (four) times daily as  needed (dry eyes).     aspirin EC 81 MG tablet Take 81 mg by mouth daily. Swallow whole.     cholecalciferol (VITAMIN D3) 25 MCG (1000 UNIT) tablet Take 1,000 Units by mouth daily.     cyanocobalamin (VITAMIN B12) 1000 MCG tablet Take 2,000 mcg by mouth daily.     diazepam (VALIUM) 5 MG tablet Take 5 mg by mouth 3 (three) times daily as needed for anxiety.     estradiol (ESTRACE) 1 MG tablet Take 1 tablet (1 mg total) by mouth daily. 90 tablet 3   fenofibrate 160 MG tablet TAKE 1 TABLET BY MOUTH DAILY. 90 tablet 3   fexofenadine (ALLEGRA) 60 MG tablet Take 60 mg by mouth daily.     fluticasone (FLONASE) 50 MCG/ACT nasal spray Place 1 spray into both nostrils daily.     levothyroxine (SYNTHROID) 88 MCG tablet TAKE 1 TABLET (88 MCG TOTAL) BY MOUTH DAILY. 90  tablet 1   Melatonin 10 MG TABS Take 10 mg by mouth at bedtime.     memantine (NAMENDA) 10 MG tablet Take 1 tablet (10 mg total) by mouth 2 (two) times daily. TAKE 10 mg TABLET BY MOUTH 2 TIMES DAILY. 180 tablet 4   methocarbamol (ROBAXIN) 500 MG tablet Take 1 tablet (500 mg total) by mouth every 6 (six) hours as needed for muscle spasms. 30 tablet 3   Multiple Vitamins-Minerals (ADULT GUMMY PO) Take 2 capsules by mouth daily.     Olopatadine HCl (PATADAY OP) Place 1 drop into both eyes 2 (two) times daily as needed (allergies).     pantoprazole (PROTONIX) 40 MG tablet TAKE 1 TABLET BY MOUTH DAILY. 90 tablet 3   propranolol (INDERAL) 40 MG tablet Take 1 tablet (40 mg total) by mouth 2 (two) times daily. 180 tablet 3   RESTASIS 0.05 % ophthalmic emulsion Place 1 drop into both eyes 2 (two) times daily.     rOPINIRole (REQUIP) 0.5 MG tablet Take 0.5 mg by mouth at bedtime as needed (restless legs).     rosuvastatin (CRESTOR) 5 MG tablet Take 1 tablet (5 mg total) by mouth daily. 90 tablet 3   traZODone (DESYREL) 100 MG tablet Take 200 mg by mouth at bedtime.     valACYclovir (VALTREX) 500 MG tablet TAKE 1 TABLET BY MOUTH TWICE A DAY FOR 5 DAYS 10 tablet 5   venlafaxine XR (EFFEXOR-XR) 150 MG 24 hr capsule Take 300 mg by mouth daily with breakfast.     Vibegron (GEMTESA) 75 MG TABS Take 75 mg by mouth daily.     denosumab (PROLIA) 60 MG/ML SOSY injection Inject 60 mg into the skin every 6 (six) months. 1 mL 0   pregabalin (LYRICA) 75 MG capsule Take 75 mg by mouth 2 (two) times daily.     No facility-administered medications prior to visit.    PAST MEDICAL HISTORY: Past Medical History:  Diagnosis Date   Allergy    Anxiety    Arthritis    Atrial fibrillation (HCC)    Complication of anesthesia    "difficulty waking up. I could hear them but I couldn't wake up"   COPD (chronic obstructive pulmonary disease) (HCC)    Depression    Emphysema    Esophagitis    GERD (gastroesophageal reflux  disease)    Herpes simplex    History of hiatal hernia    Hypertension    Hypothyroidism    Migraines    Osteopenia    Vitamin D deficiency  PAST SURGICAL HISTORY: Past Surgical History:  Procedure Laterality Date   BACK SURGERY  02/14/2018   lumbar 3-4  fusion   BREAST EXCISIONAL BIOPSY Left 1979   Benign    CATARACT EXTRACTION, BILATERAL     CERVICAL FUSION     CHOLECYSTECTOMY     KYPHOPLASTY  09/19/2015   Dr. Danielle Dess  T12   LUMBAR DISC SURGERY  11/11/2017   L3-4   Thumb surg     THYROIDECTOMY     TONSILLECTOMY     VAGINAL HYSTERECTOMY     FAMILY HISTORY: Family History  Adopted: Yes  Family history unknown: Yes   SOCIAL HISTORY: Social History   Socioeconomic History   Marital status: Married    Spouse name: Not on file   Number of children: 2   Years of education: 14   Highest education level: Associate degree: academic program  Occupational History   Occupation: Retired  Tobacco Use   Smoking status: Every Day    Current packs/day: 0.50    Average packs/day: 0.5 packs/day for 57.3 years (28.6 ttl pk-yrs)    Types: Cigarettes    Start date: 1968   Smokeless tobacco: Never   Tobacco comments:    5  cigarettes daily  Vaping Use   Vaping status: Never Used  Substance and Sexual Activity   Alcohol use: Yes    Alcohol/week: 1.0 standard drink of alcohol    Types: 1 Glasses of wine per week    Comment: Rare   Drug use: No   Sexual activity: Not Currently    Birth control/protection: Surgical    Comment: INTERCOURSE AGE 74, SEXUAL PARTNERS LESS THAN 5  Other Topics Concern   Not on file  Social History Narrative   Lives at home with husband.   Right-handed.   1 cup caffeine daily.   Social Drivers of Corporate investment banker Strain: Low Risk  (04/26/2023)   Overall Financial Resource Strain (CARDIA)    Difficulty of Paying Living Expenses: Not very hard  Food Insecurity: No Food Insecurity (04/26/2023)   Hunger Vital Sign    Worried About  Running Out of Food in the Last Year: Never true    Ran Out of Food in the Last Year: Never true  Transportation Needs: No Transportation Needs (04/26/2023)   PRAPARE - Administrator, Civil Service (Medical): No    Lack of Transportation (Non-Medical): No  Physical Activity: Insufficiently Active (04/26/2023)   Exercise Vital Sign    Days of Exercise per Week: 4 days    Minutes of Exercise per Session: 30 min  Stress: Stress Concern Present (04/26/2023)   [employer] of Occupational Health - Occupational Stress Questionnaire    Feeling of Stress : Rather much  Social Connections: Socially Integrated (04/26/2023)   Social Connection and Isolation Panel [NHANES]    Frequency of Communication with Friends and Family: More than three times a week    Frequency of Social Gatherings with Friends and Family: Three times a week    Attends Religious Services: More than 4 times per year    Active Member of Clubs or Organizations: Yes    Attends Banker Meetings: More than 4 times per year    Marital Status: Married  Catering manager Violence: Not on file   PHYSICAL EXAM  Vitals:   06/02/23 1034  BP: 120/70  Pulse: 78  Weight: 121 lb 0.5 oz (54.9 kg)  Height: 5\' 2"  (1.575 m)  Body mass index is 22.14 kg/m.    06/02/2023   10:37 AM 06/01/2022   10:47 AM 05/27/2021   10:27 AM  MMSE - Mini Mental State Exam  Orientation to time 5 5 5   Orientation to Place 5 5 5   Registration 3 3 3   Attention/ Calculation 5 5 5   Recall 3 3 3   Language- name 2 objects 2 2 2   Language- repeat 1 1 1   Language- follow 3 step command 3 3 3   Language- read & follow direction 1 1 1   Write a sentence 1 1 1   Copy design 1 1 1   Total score 30 30 30    Generalized: Well developed, in no acute distress  Neurological examination  Mentation: Alert oriented to time, place, history taking. Follows all commands speech and language fluent Cranial nerve II-XII: Pupils were equal round  reactive to light. Extraocular movements were full, visual field were full on confrontational test. Facial sensation and strength were normal.  Head turning and shoulder shrug were normal and symmetric. Motor: The motor testing reveals 5 over 5 strength of all 4 extremities. Good symmetric motor tone is noted throughout.  Sensory: Sensory testing is intact to soft touch on all 4 extremities. No evidence of extinction is noted.  Coordination: Cerebellar testing reveals good finger-nose-finger and heel-to-shin bilaterally.  Mild tremor with finger nose bilaterally. Handwriting sample is well maintained.  Gait and station: Gait is normal but limp on the left  Reflexes: Deep tendon reflexes are symmetric and normal bilaterally.   DIAGNOSTIC DATA (LABS, IMAGING, TESTING) - I reviewed patient records, labs, notes, testing and imaging myself where available.  Lab Results  Component Value Date   WBC 13.4 (H) 01/04/2023   HGB 10.5 (L) 01/04/2023   HCT 32.2 (L) 01/04/2023   MCV 98.2 01/04/2023   PLT 226 01/04/2023      Component Value Date/Time   NA 136 01/04/2023 0825   K 4.0 01/04/2023 0825   CL 103 01/04/2023 0825   CO2 27 01/04/2023 0825   GLUCOSE 137 (H) 01/04/2023 0825   BUN 13 01/04/2023 0825   CREATININE 1.05 (H) 01/04/2023 0825   CREATININE 1.32 (H) 10/28/2022 1141   CALCIUM 9.0 01/04/2023 0825   PROT 6.5 04/25/2023 1012   ALBUMIN 4.0 10/07/2016 0925   AST 18 04/25/2023 1012   ALT 9 04/25/2023 1012   ALKPHOS 49 10/07/2016 0925   BILITOT 0.3 04/25/2023 1012   GFRNONAA 55 (L) 01/04/2023 0825   GFRNONAA 52 (L) 05/20/2020 1619   GFRAA 60 05/20/2020 1619   Lab Results  Component Value Date   CHOL 130 04/25/2023   HDL 51 04/25/2023   LDLCALC 56 04/25/2023   TRIG 157 (H) 04/25/2023   CHOLHDL 2.5 04/25/2023   Lab Results  Component Value Date   HGBA1C 4.9 10/07/2016   Lab Results  Component Value Date   VITAMINB12 363 10/21/2022   Lab Results  Component Value Date    TSH 0.54 04/25/2023    Margie Ege, AGNP-C, DNP 06/02/2023, 10:53 AM [facility] [address], [address] [place], [place] [address] [phone]

## 2023-06-02 ENCOUNTER — Encounter: Payer: Self-pay | Admitting: Neurology

## 2023-06-02 ENCOUNTER — Ambulatory Visit: Payer: Medicare HMO | Admitting: Neurology

## 2023-06-02 VITALS — BP 120/70 | HR 78 | Ht 62.0 in | Wt 121.0 lb

## 2023-06-02 DIAGNOSIS — G250 Essential tremor: Secondary | ICD-10-CM | POA: Diagnosis not present

## 2023-06-02 DIAGNOSIS — G3184 Mild cognitive impairment, so stated: Secondary | ICD-10-CM

## 2023-06-02 MED ORDER — PROPRANOLOL HCL 40 MG PO TABS: 40.0000 mg | ORAL_TABLET | Freq: Two times a day (BID) | ORAL | 3 refills | Status: AC

## 2023-06-02 MED ORDER — MEMANTINE HCL 10 MG PO TABS: 10.0000 mg | ORAL_TABLET | Freq: Two times a day (BID) | ORAL | 4 refills | Status: AC

## 2023-06-02 NOTE — Patient Instructions (Signed)
 Great to see you today! Continue current medications Continue exercise, physical therapy Follow up in 1 year

## 2023-06-06 DIAGNOSIS — M47816 Spondylosis without myelopathy or radiculopathy, lumbar region: Secondary | ICD-10-CM | POA: Diagnosis not present

## 2023-06-09 DIAGNOSIS — M47816 Spondylosis without myelopathy or radiculopathy, lumbar region: Secondary | ICD-10-CM | POA: Diagnosis not present

## 2023-06-13 DIAGNOSIS — M47816 Spondylosis without myelopathy or radiculopathy, lumbar region: Secondary | ICD-10-CM | POA: Diagnosis not present

## 2023-06-15 DIAGNOSIS — H16223 Keratoconjunctivitis sicca, not specified as Sjogren's, bilateral: Secondary | ICD-10-CM | POA: Diagnosis not present

## 2023-06-15 DIAGNOSIS — H02834 Dermatochalasis of left upper eyelid: Secondary | ICD-10-CM | POA: Diagnosis not present

## 2023-06-15 DIAGNOSIS — H02831 Dermatochalasis of right upper eyelid: Secondary | ICD-10-CM | POA: Diagnosis not present

## 2023-06-16 DIAGNOSIS — M47816 Spondylosis without myelopathy or radiculopathy, lumbar region: Secondary | ICD-10-CM | POA: Diagnosis not present

## 2023-06-20 DIAGNOSIS — M47816 Spondylosis without myelopathy or radiculopathy, lumbar region: Secondary | ICD-10-CM | POA: Diagnosis not present

## 2023-06-23 DIAGNOSIS — M47816 Spondylosis without myelopathy or radiculopathy, lumbar region: Secondary | ICD-10-CM | POA: Diagnosis not present

## 2023-06-28 DIAGNOSIS — M47816 Spondylosis without myelopathy or radiculopathy, lumbar region: Secondary | ICD-10-CM | POA: Diagnosis not present

## 2023-06-30 DIAGNOSIS — M47816 Spondylosis without myelopathy or radiculopathy, lumbar region: Secondary | ICD-10-CM | POA: Diagnosis not present

## 2023-07-04 DIAGNOSIS — M47816 Spondylosis without myelopathy or radiculopathy, lumbar region: Secondary | ICD-10-CM | POA: Diagnosis not present

## 2023-07-07 DIAGNOSIS — M47816 Spondylosis without myelopathy or radiculopathy, lumbar region: Secondary | ICD-10-CM | POA: Diagnosis not present

## 2023-07-11 DIAGNOSIS — M47816 Spondylosis without myelopathy or radiculopathy, lumbar region: Secondary | ICD-10-CM | POA: Diagnosis not present

## 2023-07-14 DIAGNOSIS — N3281 Overactive bladder: Secondary | ICD-10-CM | POA: Diagnosis not present

## 2023-07-14 DIAGNOSIS — M47816 Spondylosis without myelopathy or radiculopathy, lumbar region: Secondary | ICD-10-CM | POA: Diagnosis not present

## 2023-07-14 DIAGNOSIS — N319 Neuromuscular dysfunction of bladder, unspecified: Secondary | ICD-10-CM | POA: Diagnosis not present

## 2023-07-18 DIAGNOSIS — M47816 Spondylosis without myelopathy or radiculopathy, lumbar region: Secondary | ICD-10-CM | POA: Diagnosis not present

## 2023-07-20 DIAGNOSIS — M5412 Radiculopathy, cervical region: Secondary | ICD-10-CM | POA: Diagnosis not present

## 2023-07-20 DIAGNOSIS — M47816 Spondylosis without myelopathy or radiculopathy, lumbar region: Secondary | ICD-10-CM | POA: Diagnosis not present

## 2023-07-25 ENCOUNTER — Encounter: Payer: Self-pay | Admitting: Internal Medicine

## 2023-07-26 ENCOUNTER — Other Ambulatory Visit: Payer: Self-pay

## 2023-07-26 MED ORDER — ROSUVASTATIN CALCIUM 5 MG PO TABS: 5.0000 mg | ORAL_TABLET | Freq: Every day | ORAL | 3 refills | Status: AC

## 2023-07-26 NOTE — Telephone Encounter (Signed)
 Per patient,  Hello!   Will you ask Dr. Liane Redman to e-script this medication to [facility]? Dr. Pasqual Bone advised me that Dr. Liane Redman can take over my meds now.    I have 6 days left on this prescription with no refills. He has been prescribing a 90-day supply.   Thank you!    Megan Williams 1945-10-04 [phone]

## 2023-07-28 DIAGNOSIS — M47816 Spondylosis without myelopathy or radiculopathy, lumbar region: Secondary | ICD-10-CM | POA: Diagnosis not present

## 2023-08-02 DIAGNOSIS — M47816 Spondylosis without myelopathy or radiculopathy, lumbar region: Secondary | ICD-10-CM | POA: Diagnosis not present

## 2023-08-04 DIAGNOSIS — M47816 Spondylosis without myelopathy or radiculopathy, lumbar region: Secondary | ICD-10-CM | POA: Diagnosis not present

## 2023-08-08 DIAGNOSIS — M47816 Spondylosis without myelopathy or radiculopathy, lumbar region: Secondary | ICD-10-CM | POA: Diagnosis not present

## 2023-08-09 DIAGNOSIS — M5412 Radiculopathy, cervical region: Secondary | ICD-10-CM | POA: Diagnosis not present

## 2023-08-09 DIAGNOSIS — M47813 Spondylosis without myelopathy or radiculopathy, cervicothoracic region: Secondary | ICD-10-CM | POA: Diagnosis not present

## 2023-08-09 DIAGNOSIS — M4319 Spondylolisthesis, multiple sites in spine: Secondary | ICD-10-CM | POA: Diagnosis not present

## 2023-08-09 DIAGNOSIS — M47812 Spondylosis without myelopathy or radiculopathy, cervical region: Secondary | ICD-10-CM | POA: Diagnosis not present

## 2023-08-09 DIAGNOSIS — M4802 Spinal stenosis, cervical region: Secondary | ICD-10-CM | POA: Diagnosis not present

## 2023-08-10 DIAGNOSIS — M5412 Radiculopathy, cervical region: Secondary | ICD-10-CM | POA: Diagnosis not present

## 2023-08-11 DIAGNOSIS — M47816 Spondylosis without myelopathy or radiculopathy, lumbar region: Secondary | ICD-10-CM | POA: Diagnosis not present

## 2023-08-12 ENCOUNTER — Other Ambulatory Visit: Payer: Self-pay | Admitting: Neurological Surgery

## 2023-08-15 DIAGNOSIS — M47816 Spondylosis without myelopathy or radiculopathy, lumbar region: Secondary | ICD-10-CM | POA: Diagnosis not present

## 2023-08-17 ENCOUNTER — Other Ambulatory Visit: Payer: Self-pay | Admitting: Nurse Practitioner

## 2023-08-17 DIAGNOSIS — Z79890 Hormone replacement therapy (postmenopausal): Secondary | ICD-10-CM

## 2023-08-18 DIAGNOSIS — M47816 Spondylosis without myelopathy or radiculopathy, lumbar region: Secondary | ICD-10-CM | POA: Diagnosis not present

## 2023-08-18 NOTE — Pre-Procedure Instructions (Signed)
 Surgical Instructions   Your procedure is scheduled on Monday, June 30th. Report to [facility] Main Entrance A at 12:10 P.M., then check in with the Admitting office. Any questions or running late day of surgery: call [phone]  Questions prior to your surgery date: call [phone], Monday-Friday, 8am-4pm. If you experience any cold or flu symptoms such as cough, fever, chills, shortness of breath, etc. between now and your scheduled surgery, please notify us  at the above number.     Remember:  Do not eat or drink after midnight the night before your surgery    Take these medicines the morning of surgery with A SIP OF WATER  acetaminophen  (TYLENOL )  fenofibrate   fexofenadine (ALLEGRA)  fluticasone  (FLONASE )  levothyroxine  (SYNTHROID )  memantine  (NAMENDA )  pantoprazole  (PROTONIX )  propranolol  (INDERAL )  RESTASIS   rosuvastatin  (CRESTOR )  venlafaxine  XR (EFFEXOR -XR)  Vibegron (GEMTESA)    May take these medicines IF NEEDED: ARTIFICIAL TEAR SOLUTION OP  diazepam  (VALIUM )  methocarbamol  (ROBAXIN )  Olopatadine  HCl (PATADAY  OP)  oxyCODONE -acetaminophen  (PERCOCET)  valACYclovir  (VALTREX )   Follow your surgeon's instructions on when to stop Aspirin.  If no instructions were given by your surgeon then you will need to call the office to get those instructions.    One week prior to surgery, STOP taking any Aleve, Naproxen, Ibuprofen , Motrin , Advil , Goody's, BC's, all herbal medications, fish oil, and non-prescription vitamins.                     Do NOT Smoke (Tobacco/Vaping) for 24 hours prior to your procedure.  If you use a CPAP at night, you may bring your mask/headgear for your overnight stay.   You will be asked to remove any contacts, glasses, piercing's, hearing aid's, dentures/partials prior to surgery. Please bring cases for these items if needed.    Patients discharged the day of surgery will not be allowed to drive home, and someone needs to stay with them for  24 hours.  SURGICAL WAITING ROOM VISITATION Patients may have no more than 2 support people in the waiting area - these visitors may rotate.   Pre-op nurse will coordinate an appropriate time for 1 ADULT support person, who may not rotate, to accompany patient in pre-op.  Children under the age of 65 must have an adult with them who is not the patient and must remain in the main waiting area with an adult.  If the patient needs to stay at the hospital during part of their recovery, the visitor guidelines for inpatient rooms apply.  Please refer to the [employer] website for the visitor guidelines for any additional information.   If you received a COVID test during your pre-op visit  it is requested that you wear a mask when out in public, stay away from anyone that may not be feeling well and notify your surgeon if you develop symptoms. If you have been in contact with anyone that has tested positive in the last 10 days please notify you surgeon.      Pre-operative 5 CHG Bathing Instructions   You can play a key role in reducing the risk of infection after surgery. Your skin needs to be as free of germs as possible. You can reduce the number of germs on your skin by washing with CHG (chlorhexidine  gluconate) soap before surgery. CHG is an antiseptic soap that kills germs and continues to kill germs even after washing.   DO NOT use if you have an allergy to chlorhexidine /CHG or antibacterial  soaps. If your skin becomes reddened or irritated, stop using the CHG and notify one of our RNs at [phone].   Please shower with the CHG soap starting 4 days before surgery using the following schedule:     Please keep in mind the following:  DO NOT shave, including legs and underarms, starting the day of your first shower.   You may shave your face at any point before/day of surgery.  Place clean sheets on your bed the day you start using CHG soap. Use a clean washcloth (not used since being  washed) for each shower. DO NOT sleep with pets once you start using the CHG.   CHG Shower Instructions:  Wash your face and private area with normal soap. If you choose to wash your hair, wash first with your normal shampoo.  After you use shampoo/soap, rinse your hair and body thoroughly to remove shampoo/soap residue.  Turn the water OFF and apply about 3 tablespoons (45 ml) of CHG soap to a CLEAN washcloth.  Apply CHG soap ONLY FROM YOUR NECK DOWN TO YOUR TOES (washing for 3-5 minutes)  DO NOT use CHG soap on face, private areas, open wounds, or sores.  Pay special attention to the area where your surgery is being performed.  If you are having back surgery, having someone wash your back for you may be helpful. Wait 2 minutes after CHG soap is applied, then you may rinse off the CHG soap.  Pat dry with a clean towel  Put on clean clothes/pajamas   If you choose to wear lotion, please use ONLY the CHG-compatible lotions that are listed below.  Additional instructions for the day of surgery: DO NOT APPLY any lotions, deodorants, cologne, or perfumes.   Do not bring valuables to the hospital. [facility] is not responsible for any belongings/valuables. Do not wear nail polish, gel polish, artificial nails, or any other type of covering on natural nails (fingers and toes) Do not wear jewelry or makeup Put on clean/comfortable clothes.  Please brush your teeth.  Ask your nurse before applying any prescription medications to the skin.     CHG Compatible Lotions   Aveeno Moisturizing lotion  Cetaphil Moisturizing Cream  Cetaphil Moisturizing Lotion  Clairol Herbal Essence Moisturizing Lotion, Dry Skin  Clairol Herbal Essence Moisturizing Lotion, Extra Dry Skin  Clairol Herbal Essence Moisturizing Lotion, Normal Skin  Curel Age Defying Therapeutic Moisturizing Lotion with Alpha Hydroxy  Curel Extreme Care Body Lotion  Curel Soothing Hands Moisturizing Hand Lotion  Curel Therapeutic  Moisturizing Cream, Fragrance-Free  Curel Therapeutic Moisturizing Lotion, Fragrance-Free  Curel Therapeutic Moisturizing Lotion, Original Formula  Eucerin Daily Replenishing Lotion  Eucerin Dry Skin Therapy Plus Alpha Hydroxy Crme  Eucerin Dry Skin Therapy Plus Alpha Hydroxy Lotion  Eucerin Original Crme  Eucerin Original Lotion  Eucerin Plus Crme Eucerin Plus Lotion  Eucerin TriLipid Replenishing Lotion  Keri Anti-Bacterial Hand Lotion  Keri Deep Conditioning Original Lotion Dry Skin Formula Softly Scented  Keri Deep Conditioning Original Lotion, Fragrance Free Sensitive Skin Formula  Keri Lotion Fast Absorbing Fragrance Free Sensitive Skin Formula  Keri Lotion Fast Absorbing Softly Scented Dry Skin Formula  Keri Original Lotion  Keri Skin Renewal Lotion Keri Silky Smooth Lotion  Keri Silky Smooth Sensitive Skin Lotion  Nivea Body Creamy Conditioning Oil  Nivea Body Extra Enriched Teacher, adult education Moisturizing Lotion Nivea Crme  Nivea Skin Firming Lotion  NutraDerm 30 Skin Lotion  NutraDerm Skin Lotion  NutraDerm Therapeutic Skin Cream  NutraDerm Therapeutic Skin Lotion  ProShield Protective Hand Cream  Provon moisturizing lotion  Please read over the following fact sheets that you were given.

## 2023-08-18 NOTE — Telephone Encounter (Signed)
 Medication refill request: estrace  1mg  tablet Last AEX:  07-30-21 Last OV: 08-03-22 Next AEX: 09-12-23 Last MMG (if hormonal medication request): 11-30-22 category c density birads 1:neg Refill authorized: please approve if appropriate

## 2023-08-19 ENCOUNTER — Encounter (HOSPITAL_COMMUNITY)
Admission: RE | Admit: 2023-08-19 | Discharge: 2023-08-19 | Disposition: A | Discharge status: Home / Self Care | Source: Ambulatory Visit | Attending: Neurological Surgery | Admitting: Neurological Surgery

## 2023-08-19 ENCOUNTER — Encounter (HOSPITAL_COMMUNITY): Payer: Self-pay

## 2023-08-19 ENCOUNTER — Other Ambulatory Visit: Payer: Self-pay

## 2023-08-19 VITALS — BP 135/70 | HR 65 | Temp 98.2°F | Resp 16 | Ht 62.0 in | Wt 119.3 lb

## 2023-08-19 DIAGNOSIS — J449 Chronic obstructive pulmonary disease, unspecified: Secondary | ICD-10-CM | POA: Diagnosis not present

## 2023-08-19 DIAGNOSIS — F0280 Dementia in other diseases classified elsewhere without behavioral disturbance: Secondary | ICD-10-CM | POA: Diagnosis not present

## 2023-08-19 DIAGNOSIS — G309 Alzheimer's disease, unspecified: Secondary | ICD-10-CM | POA: Diagnosis not present

## 2023-08-19 DIAGNOSIS — E039 Hypothyroidism, unspecified: Secondary | ICD-10-CM | POA: Insufficient documentation

## 2023-08-19 DIAGNOSIS — F17210 Nicotine dependence, cigarettes, uncomplicated: Secondary | ICD-10-CM | POA: Insufficient documentation

## 2023-08-19 DIAGNOSIS — Z01812 Encounter for preprocedural laboratory examination: Secondary | ICD-10-CM | POA: Diagnosis not present

## 2023-08-19 DIAGNOSIS — K449 Diaphragmatic hernia without obstruction or gangrene: Secondary | ICD-10-CM | POA: Diagnosis not present

## 2023-08-19 DIAGNOSIS — I3139 Other pericardial effusion (noninflammatory): Secondary | ICD-10-CM | POA: Diagnosis not present

## 2023-08-19 DIAGNOSIS — K219 Gastro-esophageal reflux disease without esophagitis: Secondary | ICD-10-CM | POA: Diagnosis not present

## 2023-08-19 DIAGNOSIS — Z01818 Encounter for other preprocedural examination: Secondary | ICD-10-CM

## 2023-08-19 DIAGNOSIS — I10 Essential (primary) hypertension: Secondary | ICD-10-CM | POA: Insufficient documentation

## 2023-08-19 DIAGNOSIS — Z79899 Other long term (current) drug therapy: Secondary | ICD-10-CM | POA: Diagnosis not present

## 2023-08-19 LAB — CBC
HCT: 42.8 % (ref 36.0–46.0)
Hemoglobin: 13.8 g/dL (ref 12.0–15.0)
MCH: 30.9 pg (ref 26.0–34.0)
MCHC: 32.2 g/dL (ref 30.0–36.0)
MCV: 95.7 fL (ref 80.0–100.0)
Platelets: 288 10*3/uL (ref 150–400)
RBC: 4.47 MIL/uL (ref 3.87–5.11)
RDW: 14.7 % (ref 11.5–15.5)
WBC: 8.9 10*3/uL (ref 4.0–10.5)
nRBC: 0 % (ref 0.0–0.2)

## 2023-08-19 LAB — BASIC METABOLIC PANEL WITH GFR
Anion gap: 6 (ref 5–15)
BUN: 9 mg/dL (ref 8–23)
CO2: 29 mmol/L (ref 22–32)
Calcium: 9.4 mg/dL (ref 8.9–10.3)
Chloride: 104 mmol/L (ref 98–111)
Creatinine, Ser: 1 mg/dL (ref 0.44–1.00)
GFR, Estimated: 58 mL/min — ABNORMAL LOW (ref >60)
Glucose, Bld: 101 mg/dL — ABNORMAL HIGH (ref 70–99)
Potassium: 4.4 mmol/L (ref 3.5–5.1)
Sodium: 139 mmol/L (ref 135–145)

## 2023-08-19 LAB — SURGICAL PCR SCREEN
MRSA, PCR: NEGATIVE
Staphylococcus aureus: NEGATIVE

## 2023-08-19 LAB — TYPE AND SCREEN
ABO/RH(D): O POS
Antibody Screen: NEGATIVE

## 2023-08-19 NOTE — Progress Notes (Signed)
 PCP - Karlyne Oxford, MD Cardiologist - Gloriann Larger, MD (per pt she does not see a cardiologist anymore) Neurologist -Phebe Brasil, MD  PPM/ICD - denies Device Orders - n/a Rep Notified - n/a  Chest x-ray - 10/28/2020 EKG - 10/28/2022 Stress Test - denies ECHO - 12/05/2020 Cardiac Cath - denies  Sleep Study - denies CPAP - n/a  Fasting Blood Sugar - no Dm Checks Blood Sugar _____ times a day  Last dose of GLP1 agonist-  n/a GLP1 instructions: n/a  Blood Thinner Instructions: n/a Aspirin Instructions: follow instructions from your surgeon's office.   ERAS Protcol - no, NPO PRE-SURGERY Ensure or G2- no  COVID TEST- n/a   Anesthesia review: yes (history of A-fib and HTN)  Patient denies shortness of breath, fever, cough and chest pain at PAT appointment   All instructions explained to the patient, with a verbal understanding of the material. Patient agrees to go over the instructions while at home for a better understanding. Patient also instructed to self quarantine after being tested for COVID-19. The opportunity to ask questions was provided.

## 2023-08-22 NOTE — Progress Notes (Signed)
 Anesthesia Chart Review:  78 year old female follows with cardiology for history of HTN, pericardial effusion and reactive atrial fibrillation. She was seen in August 2022 with new pericardial effusion and prodrome consistent with myopericarditis and reactive atrial fibrillation. She converted back to sinus rhythm and had interval echo with resolution of effusion. No known recurrence of atrial fibrillation.  She was last seen by Josefa Beauvais on 11/10/2023 at which time she received clearance to undergo lumbar fusion and was deemed a low risk from cardiac standpoint, able to complete >4 METS, RCRI class I with 0.4% of MACE.  She did subsequently undergo lumbar fusion 01/03/2023 without complication.  Other pertinent history includes current smoker with associated COPD - not maintained on any daily inhaled medications, GERD/hiatal hernia maintained on PPI, hypothyroidism, vertigo, essential tremor stable on propranolol ..  Alzheimer's disease listed in patient history, however, last neurology note 06/02/2023 states MMSE stable at 30/30. Patient wished to continue Namenda .  Note states she has mild cognitive impairment.  Preop labs reviewed, WNL.  EKG 10/28/2022: Sinus bradycardia.  Rate 59.   TTE 12/05/2020:  1. Left ventricular ejection fraction, by estimation, is 60 to 65%. The  left ventricle has normal function. The left ventricle has no regional  wall motion abnormalities.   2. Right ventricular systolic function is normal. The right ventricular  size is normal.   3. The mitral valve is normal in structure. Trivial mitral valve  regurgitation. No evidence of mitral stenosis.   4. The aortic valve is normal in structure. Aortic valve regurgitation is  trivial. Mild aortic valve stenosis.   5. The inferior vena cava is normal in size with greater than 50%  respiratory variability, suggesting right atrial pressure of 3 mmHg.   Comparison(s): Prior images reviewed side by side. The pericardial   effusion has resolved.     Lynwood Geofm RIGGERS [facility] Short Stay Center/Anesthesiology Phone [phone] 08/22/2023 1:14 PM

## 2023-08-22 NOTE — Anesthesia Preprocedure Evaluation (Addendum)
 Anesthesia Evaluation  Patient identified by MRN, date of birth, ID band Patient awake    Reviewed: Allergy & Precautions, H&P , NPO status , Patient's Chart, lab work & pertinent test results  Airway Mallampati: II   Neck ROM: limited    Dental   Pulmonary COPD, Current Smoker and Patient abstained from smoking.   breath sounds clear to auscultation       Cardiovascular hypertension, + dysrhythmias Atrial Fibrillation  Rhythm:regular Rate:Normal     Neuro/Psych  Headaches PSYCHIATRIC DISORDERS Anxiety Depression   Dementia    GI/Hepatic hiatal hernia,GERD  ,,  Endo/Other  Hypothyroidism    Renal/GU      Musculoskeletal  (+) Arthritis ,    Abdominal   Peds  Hematology   Anesthesia Other Findings   Reproductive/Obstetrics                             Anesthesia Physical Anesthesia Plan  ASA: 3  Anesthesia Plan: General   Post-op Pain Management:    Induction: Intravenous  PONV Risk Score and Plan: 2 and Ondansetron , Dexamethasone  and Treatment may vary due to age or medical condition  Airway Management Planned: Oral ETT and Video Laryngoscope Planned  Additional Equipment:   Intra-op Plan:   Post-operative Plan: Extubation in OR  Informed Consent: I have reviewed the patients History and Physical, chart, labs and discussed the procedure including the risks, benefits and alternatives for the proposed anesthesia with the patient or authorized representative who has indicated his/her understanding and acceptance.     Dental advisory given  Plan Discussed with: CRNA, Anesthesiologist and Surgeon  Anesthesia Plan Comments: (PAT note by Lynwood Hope, PA-C:  78 year old female follows with cardiology for history of HTN, pericardial effusion and reactive atrial fibrillation. She was seen in August 2022 with new pericardial effusion and prodrome consistent with myopericarditis and  reactive atrial fibrillation. She converted back to sinus rhythm and had interval echo with resolution of effusion. No known recurrence of atrial fibrillation.  She was last seen by Josefa Beauvais on 11/10/2023 at which time she received clearance to undergo lumbar fusion and was deemed a low risk from cardiac standpoint, able to complete >4 METS, RCRI class I with 0.4% of MACE.  She did subsequently undergo lumbar fusion 01/03/2023 without complication.  Other pertinent history includes current smoker with associated COPD - not maintained on any daily inhaled medications, GERD/hiatal hernia maintained on PPI, hypothyroidism, vertigo, essential tremor stable on propranolol ..  Alzheimer's disease listed in patient history, however, last neurology note 06/02/2023 states MMSE stable at 30/30. Patient wished to continue Namenda .  Note states she has mild cognitive impairment.  Preop labs reviewed, WNL.  EKG 10/28/2022: Sinus bradycardia.  Rate 59.  TTE 12/05/2020: 1. Left ventricular ejection fraction, by estimation, is 60 to 65%. The  left ventricle has normal function. The left ventricle has no regional  wall motion abnormalities.  2. Right ventricular systolic function is normal. The right ventricular  size is normal.  3. The mitral valve is normal in structure. Trivial mitral valve  regurgitation. No evidence of mitral stenosis.  4. The aortic valve is normal in structure. Aortic valve regurgitation is  trivial. Mild aortic valve stenosis.  5. The inferior vena cava is normal in size with greater than 50%  respiratory variability, suggesting right atrial pressure of 3 mmHg.   Comparison(s): Prior images reviewed side by side. The pericardial  effusion has resolved.   )  Anesthesia Quick Evaluation

## 2023-08-25 DIAGNOSIS — M47816 Spondylosis without myelopathy or radiculopathy, lumbar region: Secondary | ICD-10-CM | POA: Diagnosis not present

## 2023-08-29 ENCOUNTER — Inpatient Hospital Stay (HOSPITAL_COMMUNITY)

## 2023-08-29 ENCOUNTER — Encounter (HOSPITAL_COMMUNITY)
Admission: RE | Disposition: A | Discharge status: Home Health | Payer: Self-pay | Source: Home / Self Care | Attending: Neurological Surgery

## 2023-08-29 ENCOUNTER — Inpatient Hospital Stay (HOSPITAL_COMMUNITY)
Admission: RE | Admit: 2023-08-29 | Discharge: 2023-08-30 | DRG: 430 | Disposition: A | Discharge status: Home Health | Attending: Neurological Surgery | Admitting: Neurological Surgery

## 2023-08-29 ENCOUNTER — Other Ambulatory Visit: Payer: Self-pay

## 2023-08-29 ENCOUNTER — Inpatient Hospital Stay (HOSPITAL_COMMUNITY): Admitting: Certified Registered"

## 2023-08-29 ENCOUNTER — Inpatient Hospital Stay (HOSPITAL_COMMUNITY): Payer: Self-pay | Admitting: Physician Assistant

## 2023-08-29 DIAGNOSIS — M5010 Cervical disc disorder with radiculopathy, unspecified cervical region: Principal | ICD-10-CM | POA: Diagnosis present

## 2023-08-29 DIAGNOSIS — F418 Other specified anxiety disorders: Secondary | ICD-10-CM | POA: Diagnosis not present

## 2023-08-29 DIAGNOSIS — M4722 Other spondylosis with radiculopathy, cervical region: Secondary | ICD-10-CM | POA: Diagnosis present

## 2023-08-29 DIAGNOSIS — J439 Emphysema, unspecified: Secondary | ICD-10-CM | POA: Diagnosis not present

## 2023-08-29 DIAGNOSIS — Z880 Allergy status to penicillin: Secondary | ICD-10-CM | POA: Diagnosis not present

## 2023-08-29 DIAGNOSIS — F17210 Nicotine dependence, cigarettes, uncomplicated: Secondary | ICD-10-CM | POA: Diagnosis present

## 2023-08-29 DIAGNOSIS — G309 Alzheimer's disease, unspecified: Secondary | ICD-10-CM | POA: Diagnosis present

## 2023-08-29 DIAGNOSIS — K219 Gastro-esophageal reflux disease without esophagitis: Secondary | ICD-10-CM | POA: Diagnosis present

## 2023-08-29 DIAGNOSIS — E890 Postprocedural hypothyroidism: Secondary | ICD-10-CM | POA: Diagnosis not present

## 2023-08-29 DIAGNOSIS — M4322 Fusion of spine, cervical region: Secondary | ICD-10-CM | POA: Diagnosis not present

## 2023-08-29 DIAGNOSIS — Z91040 Latex allergy status: Secondary | ICD-10-CM

## 2023-08-29 DIAGNOSIS — M5412 Radiculopathy, cervical region: Secondary | ICD-10-CM | POA: Diagnosis present

## 2023-08-29 DIAGNOSIS — F0280 Dementia in other diseases classified elsewhere without behavioral disturbance: Secondary | ICD-10-CM | POA: Diagnosis present

## 2023-08-29 DIAGNOSIS — I10 Essential (primary) hypertension: Secondary | ICD-10-CM | POA: Diagnosis present

## 2023-08-29 DIAGNOSIS — I4891 Unspecified atrial fibrillation: Secondary | ICD-10-CM | POA: Diagnosis not present

## 2023-08-29 DIAGNOSIS — M4802 Spinal stenosis, cervical region: Principal | ICD-10-CM | POA: Diagnosis present

## 2023-08-29 DIAGNOSIS — Z881 Allergy status to other antibiotic agents status: Secondary | ICD-10-CM

## 2023-08-29 DIAGNOSIS — M5011 Cervical disc disorder with radiculopathy,  high cervical region: Secondary | ICD-10-CM | POA: Diagnosis not present

## 2023-08-29 DIAGNOSIS — Z888 Allergy status to other drugs, medicaments and biological substances status: Secondary | ICD-10-CM

## 2023-08-29 DIAGNOSIS — Z885 Allergy status to narcotic agent status: Secondary | ICD-10-CM | POA: Diagnosis not present

## 2023-08-29 DIAGNOSIS — J449 Chronic obstructive pulmonary disease, unspecified: Secondary | ICD-10-CM | POA: Diagnosis not present

## 2023-08-29 DIAGNOSIS — M4712 Other spondylosis with myelopathy, cervical region: Secondary | ICD-10-CM | POA: Diagnosis not present

## 2023-08-29 DIAGNOSIS — Z981 Arthrodesis status: Secondary | ICD-10-CM | POA: Diagnosis not present

## 2023-08-29 HISTORY — PX: ANTERIOR CERVICAL DECOMP/DISCECTOMY FUSION: SHX1161

## 2023-08-29 SURGERY — ANTERIOR CERVICAL DECOMPRESSION/DISCECTOMY FUSION 1 LEVEL/HARDWARE REMOVAL
Anesthesia: General

## 2023-08-29 MED ORDER — SENNA 8.6 MG PO TABS
1.0000 | ORAL_TABLET | Freq: Two times a day (BID) | ORAL | Status: DC
  Administered 2023-08-29 – 2023-08-30 (×2): 8.6 mg via ORAL
  Filled 2023-08-29 (×2): qty 1

## 2023-08-29 MED ORDER — LIDOCAINE-EPINEPHRINE 1 %-1:100000 IJ SOLN
INTRAMUSCULAR | Status: DC | PRN
  Administered 2023-08-29: 3 mL

## 2023-08-29 MED ORDER — LACTATED RINGERS IV SOLN: INTRAVENOUS | Status: DC

## 2023-08-29 MED ORDER — SUGAMMADEX SODIUM 200 MG/2ML IV SOLN
INTRAVENOUS | Status: DC | PRN
  Administered 2023-08-29 (×2): 100 mg via INTRAVENOUS

## 2023-08-29 MED ORDER — ESTRADIOL 0.5 MG PO TABS
1.0000 mg | ORAL_TABLET | Freq: Every day | ORAL | Status: DC
  Administered 2023-08-30: 1 mg via ORAL
  Filled 2023-08-29: qty 2

## 2023-08-29 MED ORDER — FENTANYL CITRATE (PF) 100 MCG/2ML IJ SOLN: 25.0000 ug | INTRAMUSCULAR | Status: DC | PRN

## 2023-08-29 MED ORDER — DIAZEPAM 5 MG PO TABS: 10.0000 mg | ORAL_TABLET | Freq: Three times a day (TID) | ORAL | Status: DC | PRN

## 2023-08-29 MED ORDER — ONDANSETRON HCL 4 MG/2ML IJ SOLN: 4.0000 mg | Freq: Four times a day (QID) | INTRAMUSCULAR | Status: DC | PRN

## 2023-08-29 MED ORDER — CHLORHEXIDINE GLUCONATE 0.12 % MT SOLN
15.0000 mL | Freq: Once | OROMUCOSAL | Status: AC
Start: 2023-08-29 — End: 2023-08-29
  Administered 2023-08-29: 15 mL via OROMUCOSAL
  Filled 2023-08-29: qty 15

## 2023-08-29 MED ORDER — METHOCARBAMOL 500 MG PO TABS
500.0000 mg | ORAL_TABLET | Freq: Four times a day (QID) | ORAL | Status: DC | PRN
  Administered 2023-08-29 – 2023-08-30 (×3): 500 mg via ORAL
  Filled 2023-08-29 (×3): qty 1

## 2023-08-29 MED ORDER — SODIUM CHLORIDE (PF) 0.9 % IJ SOLN
INTRAMUSCULAR | Status: AC
  Filled 2023-08-29: qty 20

## 2023-08-29 MED ORDER — TRAZODONE HCL 50 MG PO TABS: 200.0000 mg | ORAL_TABLET | Freq: Every day | ORAL | Status: DC

## 2023-08-29 MED ORDER — OXYCODONE HCL 5 MG/5ML PO SOLN: 5.0000 mg | Freq: Once | ORAL | Status: DC | PRN

## 2023-08-29 MED ORDER — FLEET ENEMA RE ENEM: 1.0000 | ENEMA | Freq: Once | RECTAL | Status: DC | PRN

## 2023-08-29 MED ORDER — ALUM & MAG HYDROXIDE-SIMETH 200-200-20 MG/5ML PO SUSP: 30.0000 mL | Freq: Four times a day (QID) | ORAL | Status: DC | PRN

## 2023-08-29 MED ORDER — BUPIVACAINE HCL (PF) 0.5 % IJ SOLN
INTRAMUSCULAR | Status: AC
  Filled 2023-08-29: qty 30

## 2023-08-29 MED ORDER — OXYCODONE-ACETAMINOPHEN 5-325 MG PO TABS
1.0000 | ORAL_TABLET | Freq: Four times a day (QID) | ORAL | Status: DC | PRN
  Administered 2023-08-29 – 2023-08-30 (×3): 1 via ORAL
  Filled 2023-08-29 (×3): qty 1

## 2023-08-29 MED ORDER — OLOPATADINE HCL 0.1 % OP SOLN: 1.0000 [drp] | Freq: Two times a day (BID) | OPHTHALMIC | Status: DC | PRN

## 2023-08-29 MED ORDER — 0.9 % SODIUM CHLORIDE (POUR BTL) OPTIME
TOPICAL | Status: DC | PRN
  Administered 2023-08-29: 1000 mL

## 2023-08-29 MED ORDER — POLYETHYLENE GLYCOL 3350 17 G PO PACK
17.0000 g | PACK | Freq: Every day | ORAL | Status: DC | PRN
Start: 2023-08-29 — End: 2023-08-30

## 2023-08-29 MED ORDER — THROMBIN 5000 UNITS EX KIT
PACK | CUTANEOUS | Status: AC
  Filled 2023-08-29: qty 1

## 2023-08-29 MED ORDER — SODIUM CHLORIDE 0.9% FLUSH: 3.0000 mL | INTRAVENOUS | Status: DC | PRN

## 2023-08-29 MED ORDER — ROCURONIUM BROMIDE 10 MG/ML (PF) SYRINGE
PREFILLED_SYRINGE | INTRAVENOUS | Status: AC
Start: 2023-08-29 — End: 2023-08-29
  Filled 2023-08-29: qty 10

## 2023-08-29 MED ORDER — LIDOCAINE 2% (20 MG/ML) 5 ML SYRINGE
INTRAMUSCULAR | Status: AC
  Filled 2023-08-29: qty 5

## 2023-08-29 MED ORDER — PHENOL 1.4 % MT LIQD: 1.0000 | OROMUCOSAL | Status: DC | PRN

## 2023-08-29 MED ORDER — HYDROMORPHONE HCL 1 MG/ML IJ SOLN: 0.5000 mg | INTRAMUSCULAR | Status: DC | PRN

## 2023-08-29 MED ORDER — OXYCODONE HCL 5 MG PO TABS: 5.0000 mg | ORAL_TABLET | Freq: Once | ORAL | Status: DC | PRN

## 2023-08-29 MED ORDER — FENTANYL CITRATE (PF) 250 MCG/5ML IJ SOLN
INTRAMUSCULAR | Status: DC | PRN
  Administered 2023-08-29 (×2): 50 ug via INTRAVENOUS
  Administered 2023-08-29: 100 ug via INTRAVENOUS
  Administered 2023-08-29: 50 ug via INTRAVENOUS

## 2023-08-29 MED ORDER — SODIUM CHLORIDE 0.9 % IV SOLN: 250.0000 mL | INTRAVENOUS | Status: DC

## 2023-08-29 MED ORDER — ACETAMINOPHEN 650 MG RE SUPP: 650.0000 mg | RECTAL | Status: DC | PRN

## 2023-08-29 MED ORDER — DOCUSATE SODIUM 100 MG PO CAPS
100.0000 mg | ORAL_CAPSULE | Freq: Two times a day (BID) | ORAL | Status: DC
  Administered 2023-08-29: 100 mg via ORAL
  Filled 2023-08-29: qty 1

## 2023-08-29 MED ORDER — DEXAMETHASONE SODIUM PHOSPHATE 10 MG/ML IJ SOLN
INTRAMUSCULAR | Status: AC
  Filled 2023-08-29: qty 1

## 2023-08-29 MED ORDER — ROSUVASTATIN CALCIUM 5 MG PO TABS
5.0000 mg | ORAL_TABLET | Freq: Every day | ORAL | Status: DC
  Administered 2023-08-29 – 2023-08-30 (×2): 5 mg via ORAL
  Filled 2023-08-29 (×2): qty 1

## 2023-08-29 MED ORDER — ACETAMINOPHEN 325 MG PO TABS: 650.0000 mg | ORAL_TABLET | ORAL | Status: DC | PRN

## 2023-08-29 MED ORDER — MELATONIN 5 MG PO TABS
10.0000 mg | ORAL_TABLET | Freq: Every day | ORAL | Status: DC
  Administered 2023-08-29: 10 mg via ORAL
  Filled 2023-08-29: qty 2

## 2023-08-29 MED ORDER — FLUTICASONE PROPIONATE 50 MCG/ACT NA SUSP
1.0000 | Freq: Every day | NASAL | Status: DC
  Filled 2023-08-29: qty 16

## 2023-08-29 MED ORDER — CYCLOSPORINE 0.05 % OP EMUL
1.0000 [drp] | Freq: Two times a day (BID) | OPHTHALMIC | Status: DC
  Administered 2023-08-29: 1 [drp] via OPHTHALMIC
  Filled 2023-08-29: qty 1

## 2023-08-29 MED ORDER — CHLORHEXIDINE GLUCONATE CLOTH 2 % EX PADS: 6.0000 | MEDICATED_PAD | Freq: Once | CUTANEOUS | Status: DC

## 2023-08-29 MED ORDER — ROPINIROLE HCL 1 MG PO TABS
0.5000 mg | ORAL_TABLET | Freq: Every day | ORAL | Status: DC
  Administered 2023-08-29: 0.5 mg via ORAL
  Filled 2023-08-29: qty 1

## 2023-08-29 MED ORDER — SODIUM CHLORIDE 0.9% FLUSH
3.0000 mL | Freq: Two times a day (BID) | INTRAVENOUS | Status: DC
  Administered 2023-08-29 – 2023-08-30 (×2): 3 mL via INTRAVENOUS

## 2023-08-29 MED ORDER — ONDANSETRON HCL 4 MG/2ML IJ SOLN
INTRAMUSCULAR | Status: DC | PRN
  Administered 2023-08-29: 4 mg via INTRAVENOUS

## 2023-08-29 MED ORDER — DEXAMETHASONE SODIUM PHOSPHATE 10 MG/ML IJ SOLN
INTRAMUSCULAR | Status: DC | PRN
  Administered 2023-08-29: 10 mg via INTRAVENOUS

## 2023-08-29 MED ORDER — LEVOTHYROXINE SODIUM 88 MCG PO TABS
88.0000 ug | ORAL_TABLET | Freq: Every day | ORAL | Status: DC
  Administered 2023-08-30: 88 ug via ORAL
  Filled 2023-08-29: qty 1

## 2023-08-29 MED ORDER — LIDOCAINE-EPINEPHRINE 1 %-1:100000 IJ SOLN
INTRAMUSCULAR | Status: AC
Start: 2023-08-29 — End: 2023-08-29
  Filled 2023-08-29: qty 1

## 2023-08-29 MED ORDER — BUPIVACAINE HCL (PF) 0.5 % IJ SOLN
INTRAMUSCULAR | Status: DC | PRN
  Administered 2023-08-29: 3 mL

## 2023-08-29 MED ORDER — VENLAFAXINE HCL ER 75 MG PO CP24
300.0000 mg | ORAL_CAPSULE | Freq: Every day | ORAL | Status: DC
  Administered 2023-08-30: 300 mg via ORAL
  Filled 2023-08-29: qty 4

## 2023-08-29 MED ORDER — ROCURONIUM BROMIDE 10 MG/ML (PF) SYRINGE
PREFILLED_SYRINGE | INTRAVENOUS | Status: DC | PRN
  Administered 2023-08-29: 10 mg via INTRAVENOUS
  Administered 2023-08-29: 50 mg via INTRAVENOUS

## 2023-08-29 MED ORDER — PANTOPRAZOLE SODIUM 40 MG PO TBEC
40.0000 mg | DELAYED_RELEASE_TABLET | Freq: Every day | ORAL | Status: DC
  Administered 2023-08-30: 40 mg via ORAL
  Filled 2023-08-29: qty 1

## 2023-08-29 MED ORDER — MENTHOL 3 MG MT LOZG: 1.0000 | LOZENGE | OROMUCOSAL | Status: DC | PRN

## 2023-08-29 MED ORDER — LIDOCAINE 2% (20 MG/ML) 5 ML SYRINGE
INTRAMUSCULAR | Status: DC | PRN
  Administered 2023-08-29: 60 mg via INTRAVENOUS

## 2023-08-29 MED ORDER — MIRABEGRON ER 25 MG PO TB24
25.0000 mg | ORAL_TABLET | Freq: Every day | ORAL | Status: DC
  Administered 2023-08-30: 25 mg via ORAL
  Filled 2023-08-29: qty 1

## 2023-08-29 MED ORDER — PROPRANOLOL HCL 10 MG PO TABS
40.0000 mg | ORAL_TABLET | Freq: Two times a day (BID) | ORAL | Status: DC
  Administered 2023-08-29: 40 mg via ORAL
  Filled 2023-08-29: qty 4

## 2023-08-29 MED ORDER — LORATADINE 10 MG PO TABS
10.0000 mg | ORAL_TABLET | Freq: Every day | ORAL | Status: DC
  Administered 2023-08-30: 10 mg via ORAL
  Filled 2023-08-29: qty 1

## 2023-08-29 MED ORDER — FENTANYL CITRATE (PF) 250 MCG/5ML IJ SOLN
INTRAMUSCULAR | Status: AC
  Filled 2023-08-29: qty 5

## 2023-08-29 MED ORDER — THROMBIN 5000 UNITS EX SOLR
CUTANEOUS | Status: DC | PRN
  Administered 2023-08-29: 5 mL via TOPICAL

## 2023-08-29 MED ORDER — ONDANSETRON HCL 4 MG/2ML IJ SOLN
INTRAMUSCULAR | Status: AC
Start: 2023-08-29 — End: 2023-08-29
  Filled 2023-08-29: qty 2

## 2023-08-29 MED ORDER — BISACODYL 10 MG RE SUPP: 10.0000 mg | Freq: Every day | RECTAL | Status: DC | PRN

## 2023-08-29 MED ORDER — VANCOMYCIN HCL IN DEXTROSE 1-5 GM/200ML-% IV SOLN
1000.0000 mg | INTRAVENOUS | Status: AC
  Administered 2023-08-29: 1000 mg via INTRAVENOUS
  Filled 2023-08-29: qty 200

## 2023-08-29 MED ORDER — MEMANTINE HCL 10 MG PO TABS
10.0000 mg | ORAL_TABLET | Freq: Two times a day (BID) | ORAL | Status: DC
  Administered 2023-08-29 – 2023-08-30 (×2): 10 mg via ORAL
  Filled 2023-08-29 (×3): qty 1

## 2023-08-29 MED ORDER — ORAL CARE MOUTH RINSE: 15.0000 mL | Freq: Once | OROMUCOSAL | Status: AC

## 2023-08-29 MED ORDER — PROPOFOL 10 MG/ML IV BOLUS
INTRAVENOUS | Status: DC | PRN
  Administered 2023-08-29: 130 mg via INTRAVENOUS

## 2023-08-29 MED ORDER — ONDANSETRON HCL 4 MG PO TABS: 4.0000 mg | ORAL_TABLET | Freq: Four times a day (QID) | ORAL | Status: DC | PRN

## 2023-08-29 MED ORDER — FENOFIBRATE 160 MG PO TABS
160.0000 mg | ORAL_TABLET | Freq: Every day | ORAL | Status: DC
  Administered 2023-08-29 – 2023-08-30 (×2): 160 mg via ORAL
  Filled 2023-08-29 (×2): qty 1

## 2023-08-29 SURGICAL SUPPLY — 46 items
ALLOGRFT BNE OSSIFUSE FBR 1CC (Bone Implant) IMPLANT
BAG COUNTER SPONGE SURGICOUNT (BAG) ×1 IMPLANT
BAND RUBBER #18 3X1/16 STRL (MISCELLANEOUS) ×2 IMPLANT
BIT DRILL ACP 15 (DRILL) IMPLANT
BIT DRILL NEURO 2X3.1 SFT TUCH (MISCELLANEOUS) ×1 IMPLANT
BNDG GAUZE DERMACEA FLUFF 4 (GAUZE/BANDAGES/DRESSINGS) IMPLANT
BUR BARREL STRAIGHT FLUTE 4.0 (BURR) IMPLANT
CAGE CERV MOD 6X15X12 7D (Cage) IMPLANT
CANISTER SUCTION 3000ML PPV (SUCTIONS) ×1 IMPLANT
DERMABOND ADVANCED .7 DNX12 (GAUZE/BANDAGES/DRESSINGS) ×1 IMPLANT
DRAPE LAPAROTOMY 100X72 PEDS (DRAPES) ×1 IMPLANT
DRAPE MICROSCOPE SLANT 54X150 (MISCELLANEOUS) IMPLANT
DURAPREP 6ML APPLICATOR 50/CS (WOUND CARE) ×1 IMPLANT
ELECT COATED BLADE 2.86 ST (ELECTRODE) ×1 IMPLANT
ELECTRODE REM PT RTRN 9FT ADLT (ELECTROSURGICAL) ×1 IMPLANT
GAUZE 4X4 16PLY ~~LOC~~+RFID DBL (SPONGE) IMPLANT
GLOVE BIOGEL PI IND STRL 8.5 (GLOVE) ×1 IMPLANT
GLOVE ECLIPSE 8.5 STRL (GLOVE) ×1 IMPLANT
GOWN STRL REUS W/ TWL LRG LVL3 (GOWN DISPOSABLE) IMPLANT
GOWN STRL REUS W/ TWL XL LVL3 (GOWN DISPOSABLE) ×1 IMPLANT
GOWN STRL REUS W/TWL 2XL LVL3 (GOWN DISPOSABLE) ×1 IMPLANT
HALTER HD/CHIN CERV TRACTION D (MISCELLANEOUS) ×1 IMPLANT
HEMOSTAT POWDER KIT SURGIFOAM (HEMOSTASIS) ×1 IMPLANT
KIT BASIN OR (CUSTOM PROCEDURE TRAY) ×1 IMPLANT
KIT TURNOVER KIT B (KITS) ×1 IMPLANT
NDL HYPO 22X1.5 SAFETY MO (MISCELLANEOUS) ×1 IMPLANT
NDL SPNL 22GX3.5 QUINCKE BK (NEEDLE) ×1 IMPLANT
NEEDLE HYPO 22X1.5 SAFETY MO (MISCELLANEOUS) ×1 IMPLANT
NEEDLE SPNL 22GX3.5 QUINCKE BK (NEEDLE) ×1 IMPLANT
NS IRRIG 1000ML POUR BTL (IV SOLUTION) ×1 IMPLANT
PACK LAMINECTOMY NEURO (CUSTOM PROCEDURE TRAY) ×1 IMPLANT
PAD ARMBOARD POSITIONER FOAM (MISCELLANEOUS) ×3 IMPLANT
PATTIES SURGICAL .5 X.5 (GAUZE/BANDAGES/DRESSINGS) ×1 IMPLANT
PATTIES SURGICAL .5 X1 (DISPOSABLE) ×1 IMPLANT
PATTIES SURGICAL 1X1 (DISPOSABLE) ×1 IMPLANT
PLATE ACP 1-LEVEL1.6V22 (Plate) IMPLANT
SCREW ACP VA ST 3.5X15 (Screw) IMPLANT
SET WALTER ACTIVATION W/DRAPE (SET/KITS/TRAYS/PACK) ×1 IMPLANT
SPIKE FLUID TRANSFER (MISCELLANEOUS) ×1 IMPLANT
SPONGE INTESTINAL PEANUT (DISPOSABLE) ×1 IMPLANT
SUT VIC AB 4-0 RB1 18 (SUTURE) ×2 IMPLANT
SYR 30ML SLIP (SYRINGE) ×1 IMPLANT
TOWEL GREEN STERILE (TOWEL DISPOSABLE) ×1 IMPLANT
TOWEL GREEN STERILE FF (TOWEL DISPOSABLE) ×1 IMPLANT
TUBING FEATHERFLOW (TUBING) ×1 IMPLANT
WATER STERILE IRR 1000ML POUR (IV SOLUTION) ×1 IMPLANT

## 2023-08-29 NOTE — Anesthesia Procedure Notes (Signed)
 Procedure Name: Intubation Date/Time: 08/29/2023 4:32 PM  Performed by: Roslynn Waddell LABOR, CRNAPre-anesthesia Checklist: Patient identified, Emergency Drugs available, Suction available and Patient being monitored Patient Re-evaluated:Patient Re-evaluated prior to induction Oxygen Delivery Method: Circle System Utilized Preoxygenation: Pre-oxygenation with 100% oxygen Induction Type: IV induction Ventilation: Mask ventilation without difficulty Laryngoscope Size: Glidescope and 3 Grade View: Grade I Tube type: Oral Tube size: 7.0 mm Number of attempts: 1 Airway Equipment and Method: Stylet and Oral airway Placement Confirmation: ETT inserted through vocal cords under direct vision, positive ETCO2 and breath sounds checked- equal and bilateral Secured at: 21 cm Tube secured with: Tape Dental Injury: Teeth and Oropharynx as per pre-operative assessment  Comments: Atraumatic induction/intubation. Easy MV. SRNA VL x 1 with GS 3 blade- grade 1 view. ETT gently passed through glottis. Dentition and oral mucosa as per preop.

## 2023-08-29 NOTE — Transfer of Care (Signed)
 Immediate Anesthesia Transfer of Care Note  Patient: Megan Williams  Procedure(s) Performed: ANTERIOR CERVICAL DECOMPRESSION/DISCECTOMY FUSION cervical three-four  Patient Location: PACU  Anesthesia Type:General  Level of Consciousness: awake, alert , and oriented  Airway & Oxygen Therapy: Patient Spontanous Breathing  Post-op Assessment: Report given to RN and Post -op Vital signs reviewed and stable  Post vital signs: Reviewed and stable  Last Vitals:  Vitals Value Taken Time  BP 136/67 08/29/23 18:37  Temp    Pulse 74 08/29/23 18:40  Resp 15 08/29/23 18:40  SpO2 92 % 08/29/23 18:40  Vitals shown include unfiled device data.  Last Pain:  Vitals:   08/29/23 1329  TempSrc:   PainSc: 0-No pain         Complications: No notable events documented.

## 2023-08-29 NOTE — Progress Notes (Signed)
 Orthopedic Tech Progress Note Patient Details:  Megan Williams 23-Jun-1945 [id number]  Ortho Devices Type of Ortho Device: Soft collar Ortho Device/Splint Location: neck Ortho Device/Splint Interventions: Ordered   Post Interventions Patient Tolerated: Well Instructions Provided: Adjustment of device  [name] 08/29/2023, 7:02 PM

## 2023-08-29 NOTE — H&P (Signed)
 Megan Williams is an 78 y.o. female.   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  She has had a previous fusion from C4-C7.  She is now having symptoms of myelopathy with generalized weakness neck pain Lhermitte's phenomenon.  Recent MRI demonstrates presence of cord compression at the level of C3-4 with advanced spondylitic changes at that level.  Has been advised regarding the need for surgical decompression and stabilization.  Past Medical History:  Diagnosis Date   Allergy    Alzheimer disease (HCC) 2020   per pt; takes Namenda    Anxiety    Arthritis    Atrial fibrillation (HCC)    Complication of anesthesia    difficulty waking up. I could hear them but I couldn't wake up   COPD (chronic obstructive pulmonary disease) (HCC)    Depression    Emphysema    Esophagitis    GERD (gastroesophageal reflux disease)    Herpes simplex    History of hiatal hernia    Hypertension    Hypothyroidism    Migraines    Osteopenia    Vitamin D  deficiency     Past Surgical History:  Procedure Laterality Date   BACK SURGERY  02/14/2018   lumbar 3-4  fusion   BREAST EXCISIONAL BIOPSY Left 1979   Benign    CATARACT EXTRACTION, BILATERAL     CERVICAL FUSION     CHOLECYSTECTOMY     KYPHOPLASTY  09/19/2015   Dr. Colon  T12   LUMBAR DISC SURGERY  11/11/2017   L3-4   Thumb surg     THYROIDECTOMY     TONSILLECTOMY     VAGINAL HYSTERECTOMY      Family History  Adopted: Yes  Family history unknown: Yes   Social History:  reports that she has been smoking cigarettes. She started smoking about 57 years ago. She has a 28.7 pack-year smoking history. She has never used smokeless tobacco. She reports current alcohol  use of about 1.0 standard drink of alcohol  per week. She reports that she does not use drugs.  Allergies:  Allergies  Allergen Reactions   Doxycycline Hives,  Itching, Nausea And Vomiting and Other (See Comments)    Skin peeling, Stevens-Johnson Syndrome    Latex Rash, Hives and Itching   Morphine  Itching and Other (See Comments)    Confusion, Dizziness   Penicillins Hives    All over body    Grass Extracts [Gramineae Pollens] Cough and Itching    Other reaction(s): Eye Redness   Short Ragweed Pollen Ext Cough and Itching    Other reaction(s): Eye Redness, Other   Celebrex [Celecoxib] Other (See Comments)    Blistering, pain, swelling   Cephalosporins Hives    All over body   Codeine Nausea And Vomiting   Gabapentin     Causes TICS     No medications prior to admission.    No results found for this or any previous visit (from the past 48 hours). No results found.  Review of Systems  Constitutional:  Positive for activity change.  Musculoskeletal:  Positive for myalgias and neck pain.  Neurological:  Positive for weakness and numbness.  All other systems reviewed and are negative.   There were no vitals taken for this visit. Physical Exam Constitutional:      Appearance: Normal appearance.  HENT:     Head: Normocephalic and atraumatic.     Right Ear: Tympanic  membrane, ear canal and external ear normal.     Left Ear: Tympanic membrane, ear canal and external ear normal.     Nose: Nose normal.     Mouth/Throat:     Mouth: Mucous membranes are moist.     Pharynx: Oropharynx is clear.   Eyes:     Extraocular Movements: Extraocular movements intact.     Conjunctiva/sclera: Conjunctivae normal.     Pupils: Pupils are equal, round, and reactive to light.    Cardiovascular:     Rate and Rhythm: Normal rate and regular rhythm.     Pulses: Normal pulses.     Heart sounds: Normal heart sounds.  Pulmonary:     Effort: Pulmonary effort is normal.     Breath sounds: Normal breath sounds.  Abdominal:     General: Abdomen is flat.     Palpations: Abdomen is soft.   Musculoskeletal:        General: Normal range of motion.      Cervical back: Normal range of motion and neck supple.   Skin:    General: Skin is warm and dry.     Capillary Refill: Capillary refill takes less than 2 seconds.   Neurological:     General: No focal deficit present.     Mental Status: She is alert and oriented to person, place, and time.     Comments: Generalized decrease in tone in the upper extremities positive Lhermitte's phenomenon with slight axial compression.  Motor strength reveals 4 out of 5 strength in deltoids biceps triceps grips and intrinsics.  Deep tendon reflexes are absent in the upper extremities 2+ in the patellae 3+ in the Achilles.  Babinski's are equivocal.  Gait is moderately wide-based.  Psychiatric:        Mood and Affect: Mood normal.        Behavior: Behavior normal.        Thought Content: Thought content normal.        Judgment: Judgment normal.      Assessment/Plan Cervical spondylosis with myelopathy C3-C4.  Plan: Decompression fusion C3-4 possible removal of previous plate.  Victory JINNY Gens, MD 08/29/2023, 10:06 AM

## 2023-08-29 NOTE — Op Note (Signed)
 Date of surgery: 08/29/2023 Preoperative diagnosis: Spondylosis with stenosis and radiculopathy C3-C4.  History of fusion C4-C7.  Postoperative diagnosis: Same  Procedure: Anterior cervical decompression C3-C4 arthrodesis with structural spacer and allograft anterior plate fixation R6-R5.  Partial removal of Previously placed plate R5-R2.  Surgeon: Victory Gens  Anesthesia: General Endotracheal  Indications: Megan Williams is a 78 year old individual who has had significant neck shoulder and arm pain that has been progressively getting worse.  She has evidence of cord compression at C3-4 with severe spondylosis and biforaminal stenosis.  She has been advised regarding the need for surgical decompression and stabilization.  Procedure: The patient was brought to the operating room supine on the stretcher.  After the smooth induction of general endotracheal anesthesia her neck was prepped with alcohol  DuraPrep and draped in a sterile fashion.  A transverse incision was made on the left side of the neck and carried down through the platysma.  The plane between the sternocleidomastoid and the strap muscles was dissected bluntly until the prevertebral space was reached.  The top of the old plate was uncovered and this served as a landmark for a localizing C3-C4.  We then exposed the area of C3-C4 removed some ventral osteophytes and noted that the plate extended to the top of the vertebral body of C4.  Therefore the plate would have to be removed at least partially.  High-speed metal cutting bit was used to cut the plate just below the screw heads at C4.  The screws were removed along with the plate.  Then after removing all the debris from this area we performed a discectomy and see 3 C4 by opening the ventral aspect of the disc space with a #15 blade using a combination of curettes and rongeurs to remove a substantial quantity of severely degenerated and desiccated disc material.  As the region of the posterior  longitudinal ligament was reached the disc material got harder and partially calcified.  A high-speed drill was used to drill through this area down to the posterior longitudinal ligament.  The ligament itself was opened with a 2 mm Kerrison punch and the dissection was carried out to the markedly hypertrophied uncinate processes which were drilled down.  The lateral gutters then and the path of the exiting C4 nerve root was then decompressed.  This was done on the left and on the right.  Once an adequate decompression was obtained the interspace was sized for an appropriately sized spacer and was felt that a 6 mm tall modulus C spacer measuring 6 x 15 x 12 mm with 7 degrees of lordosis would fit best this was filled with demineralized bone matrix and placed into the interspace.  Some additional bone matrix was placed into the lateral gutters.  The then a 22 mm ACP plate was fitted to the ventral aspect of the vertebral bodies with 4 locking 3-1/2 x 15 mm screws.  A final radiograph was obtained identifying good confirmation of the hardware and fixation.  With this hemostasis was carefully and meticulously obtained in the surgical bed and when verified the platysma was closed with 4-0 Vicryl in interrupted fashion and 4-0 Vicryl was used in the subcuticular skin Dermabond was used on the skin blood loss was estimated 75 cc.

## 2023-08-29 NOTE — Anesthesia Postprocedure Evaluation (Signed)
 Anesthesia Post Note  Patient: Megan Williams  Procedure(s) Performed: ANTERIOR CERVICAL DECOMPRESSION/DISCECTOMY FUSION cervical three-four     Patient location during evaluation: PACU Anesthesia Type: General Level of consciousness: awake and alert Pain management: pain level controlled Vital Signs Assessment: post-procedure vital signs reviewed and stable Respiratory status: spontaneous breathing, nonlabored ventilation, respiratory function stable and patient connected to nasal cannula oxygen Cardiovascular status: blood pressure returned to baseline and stable Postop Assessment: no apparent nausea or vomiting Anesthetic complications: no   No notable events documented.  Last Vitals:  Vitals:   08/29/23 1900 08/29/23 1915  BP: 128/76 139/73  Pulse: 72 72  Resp: 17 17  Temp:    SpO2: 98% 100%    Last Pain:  Vitals:   08/29/23 1900  TempSrc:   PainSc: Asleep                 Debby FORBES Like

## 2023-08-30 ENCOUNTER — Encounter (HOSPITAL_COMMUNITY): Payer: Self-pay | Admitting: Neurological Surgery

## 2023-08-30 MED ORDER — METHOCARBAMOL 500 MG PO TABS: 500.0000 mg | ORAL_TABLET | Freq: Four times a day (QID) | ORAL | 3 refills | Status: AC | PRN

## 2023-08-30 MED ORDER — OXYCODONE-ACETAMINOPHEN 5-325 MG PO TABS: 1.0000 | ORAL_TABLET | ORAL | 0 refills | Status: AC | PRN

## 2023-08-30 NOTE — Discharge Instructions (Signed)
Wound Care Leave incision open to air. You may shower. Do not scrub directly on incision.  Do not put any creams, lotions, or ointments on incision. Activity Walk each and every day, increasing distance each day. No lifting greater than 8 lbs.  Avoid excessive neck motion. No driving for 2 weeks; may ride as a passenger locally. Wear neck brace at all times except when showering.  If provided soft collar, may wear for comfort unless otherwise instructed. Diet Resume your normal diet.   Call Your Doctor If Any of These Occur Redness, drainage, or swelling at the wound.  Temperature greater than 101 degrees. Severe pain not relieved by pain medication. Increased difficulty swallowing. Incision starts to come apart. Follow Up Appt Call  [phone])  for problems.  If you have any hardware placed in your spine, you will need an x-ray before your appointment.

## 2023-08-30 NOTE — Discharge Summary (Signed)
 Physician Discharge Summary  Patient ID: Megan Williams MRN: [id number] DOB/AGE: [birth year] 78 y.o.  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  History of fusion C4-C7. Principal Problem:   Cervical disc disorder with radiculopathy of cervical region   Discharged Condition: good  Hospital Course: Patient was admitted to undergo surgical decompression and fusion at C3-C4.  She tolerated surgery well.  Consults: None  Significant Diagnostic Studies: None  Treatments: See op note  Discharge Exam: Blood pressure 119/67, pulse 60, temperature 97.6 F (36.4 C), temperature source Oral, resp. rate 16, height 5' 2 (1.575 m), weight 53.5 kg, SpO2 96%. Incision is clean and dry Station and gait are intact.  Disposition: Discharge disposition: 01-Home or Self Care       Discharge Instructions     Call MD for:  redness, tenderness, or signs of infection (pain, swelling, redness, odor or green/yellow discharge around incision site)   Complete by: As directed    Call MD for:  severe uncontrolled pain   Complete by: As directed    Call MD for:  temperature >100.4   Complete by: As directed    Diet - low sodium heart healthy   Complete by: As directed    Discharge wound care:   Complete by: As directed    Okay to shower. Do not apply salves or appointments to incision. No heavy lifting with the upper extremities greater than 10 pounds. May resume driving when not requiring pain medication and patient feels comfortable with doing so.   Incentive spirometry RT   Complete by: As directed    Increase activity slowly   Complete by: As directed       Allergies as of 08/30/2023       Reactions   Doxycycline Hives, Itching, Nausea And Vomiting, Other (See Comments)   Skin peeling,  Stevens-Johnson Syndrome   Latex Rash, Hives, Itching   Morphine  Itching, Other (See Comments)   Confusion, Dizziness   Penicillins Hives   All over body   Grass Extracts [gramineae Pollens] Cough, Itching   Other reaction(s): Eye Redness   Short Ragweed Pollen Ext Cough, Itching   Other reaction(s): Eye Redness, Other   Celebrex [celecoxib] Other (See Comments)   Blistering, pain, swelling   Cephalosporins Hives   All over body   Codeine Nausea And Vomiting   Gabapentin    Causes TICS        Medication List     TAKE these medications    acetaminophen  650 MG CR tablet Commonly known as: TYLENOL  Take 1,300 mg by mouth 2 (two) times daily.   ARTIFICIAL TEAR SOLUTION OP Place 1 drop into both eyes 3 (three) times daily as needed (dry eyes). Liquid gel   aspirin EC 81 MG tablet Take 81 mg by mouth daily. Swallow whole.   cholecalciferol 25 MCG (1000 UNIT) tablet Commonly known as: VITAMIN D3 Take 1,000 Units by mouth daily.   cyanocobalamin  1000 MCG tablet Commonly known as: VITAMIN B12 Take 2,000 mcg by mouth daily. Super under the tongue   diazepam  10 MG tablet Commonly known as: VALIUM  Take 10 mg by mouth 3 (three) times daily as needed for anxiety (Stress).   estradiol  1 MG tablet Commonly known as: ESTRACE  TAKE 1 TABLET BY MOUTH DAILY.   fenofibrate  160 MG tablet TAKE 1 TABLET BY MOUTH DAILY.   fexofenadine  60 MG tablet Commonly known as: ALLEGRA Take 60 mg by mouth daily.   fluticasone  50 MCG/ACT nasal spray Commonly known as: FLONASE  Place 1 spray into both nostrils daily.   Gemtesa 75 MG Tabs Generic drug: Vibegron Take 75 mg by mouth daily.   levothyroxine  88 MCG tablet Commonly known as: SYNTHROID  TAKE 1 TABLET (88 MCG TOTAL) BY MOUTH DAILY.   Melatonin 10 MG Tabs Take 10 mg by mouth at bedtime. sublingual   memantine  10 MG tablet Commonly known as: NAMENDA  Take 1 tablet (10 mg total) by mouth 2 (two) times daily. TAKE 10 mg TABLET BY  MOUTH 2 TIMES DAILY.   methocarbamol  500 MG tablet Commonly known as: ROBAXIN  Take 1 tablet (500 mg total) by mouth every 6 (six) hours as needed for muscle spasms.   multivitamin with minerals tablet Take 1 tablet by mouth daily. Alive 50+   oxyCODONE -acetaminophen  5-325 MG tablet Commonly known as: Percocet Take 1 tablet by mouth every 4 (four) hours as needed for moderate pain (pain score 4-6) or severe pain (pain score 7-10). What changed: when to take this   pantoprazole  40 MG tablet Commonly known as: PROTONIX  TAKE 1 TABLET BY MOUTH DAILY.   PATADAY  OP Place 1 drop into both eyes 2 (two) times daily as needed (allergies). Extra Strength   propranolol  40 MG tablet Commonly known as: INDERAL  Take 1 tablet (40 mg total) by mouth 2 (two) times daily.   Restasis  0.05 % ophthalmic emulsion Generic drug: cycloSPORINE  Place 1 drop into both eyes 2 (two) times daily.   rOPINIRole  0.5 MG tablet Commonly known as: REQUIP  Take 0.5 mg by mouth at bedtime.   rosuvastatin  5 MG tablet Commonly known as: CRESTOR  Take 1 tablet (5 mg total) by mouth daily.   traZODone  100 MG tablet Commonly known as: DESYREL  Take 200 mg by mouth at bedtime.   valACYclovir  500 MG tablet Commonly known as: VALTREX  TAKE 1 TABLET BY MOUTH TWICE A DAY FOR 5 DAYS What changed: See the new instructions.   venlafaxine  XR 150 MG 24 hr capsule Commonly known as: EFFEXOR -XR Take 300 mg by mouth daily with breakfast.               Discharge Care Instructions  (From admission, onward)           Start     Ordered   08/30/23 0000  Discharge wound care:       Comments: Okay to shower. Do not apply salves or appointments to incision. No heavy lifting with the upper extremities greater than 10 pounds. May resume driving when not requiring pain medication and patient feels comfortable with doing so.   08/30/23 1255            Follow-up Information     [facility] Follow up.    Specialty: Home Health Services Why: [facility] will contact you for the first home visit. Contact information: [address] [place] [address] [phone]                 Signed: Victory JINNY Gens 08/30/2023, 12:55 PM

## 2023-08-30 NOTE — Evaluation (Signed)
 Occupational Therapy Evaluation Patient Details Name: Megan Williams MRN: [id number] DOB: [birth year] Today's Date: 08/30/2023   History of Present Illness   Pt is a 78 yo female presenting on 6/30 for C3-4 ACDF with partial removal of previously placed plate R5-R2. PMH: osteopenia, hypothyroisidm, HTN, herpes simplex, emphysema, depression, COPD, arthritis, anxiety, alzheimer's, prior back and cervical surgery.     Clinical Impressions Patient admitted for above and presents with problem list below.  PTA pt was independent with ADLs, mobility and light IADLs. Patient was educated on cervical brace and wear schedule, cervical precautions, ADL compensatory techniques, AE/DME, mobility progression, safety and recommendations.  Today, pt demonstrated ability to complete bed mobility with supervision, transfers with supervision, functional mobility with supervision, and ADLs with up to contact guard assist.   She requires max cueing for precautions adherence during session, with limited carryover.  She continues to have numbness/tingling in L UE but Is using UE functionally with increased time; she reports it is improving compared to prior to surgery. At discharge, pt will have support from spouse.  Based on performance today, anticipate pt will progress well with no further needs after dc home but OT will follow acutely to optimize independence, safety and precaution adherence during ADLs, IADLs and mobility.       If plan is discharge home, recommend the following:   A little help with walking and/or transfers;A little help with bathing/dressing/bathroom;Assistance with cooking/housework;Help with stairs or ramp for entrance;Assist for transportation     Functional Status Assessment   Patient has had a recent decline in their functional status and demonstrates the ability to make significant improvements in function in a reasonable and predictable amount of time.     Equipment  Recommendations   None recommended by OT     Recommendations for Other Services   PT consult     Precautions/Restrictions   Precautions Precautions: Fall;Cervical Precaution Booklet Issued: Yes (comment) Recall of Precautions/Restrictions: Impaired Precaution/Restrictions Comments: requires max cueing to adhere to cervical precautions functionally Required Braces or Orthoses: Cervical Brace Cervical Brace: Soft collar;At all times Restrictions Weight Bearing Restrictions Per Provider Order: No     Mobility Bed Mobility Overal bed mobility: Needs Assistance Bed Mobility: Sidelying to Sit, Rolling Rolling: Supervision Sidelying to sit: Supervision       General bed mobility comments: cueing for technique, supervision for safety and precaution adherence    Transfers Overall transfer level: Needs assistance   Transfers: Sit to/from Stand Sit to Stand: Supervision           General transfer comment: for posture and technique      Balance Overall balance assessment: Mild deficits observed, not formally tested                                         ADL either performed or assessed with clinical judgement   ADL Overall ADL's : Needs assistance/impaired     Grooming: Supervision/safety;Standing           Upper Body Dressing : Contact guard assist;Standing   Lower Body Dressing: Contact guard assist;Sit to/from stand;Cueing for compensatory techniques;Cueing for back precautions   Toilet Transfer: Ambulation;Supervision/safety   Toileting- Clothing Manipulation and Hygiene: Supervision/safety;Sit to/from stand       Functional mobility during ADLs: Supervision/safety;Rolling walker (2 wheels)       Vision Baseline Vision/History: 1 Wears glasses Vision Assessment?: No apparent  visual deficits     Perception         Praxis         Pertinent Vitals/Pain Pain Assessment Pain Assessment: Faces Faces Pain Scale: Hurts a  little bit Pain Location: neck, incisional Pain Descriptors / Indicators: Discomfort, Operative site guarding Pain Intervention(s): Limited activity within patient's tolerance, Monitored during session, Repositioned     Extremity/Trunk Assessment Upper Extremity Assessment Upper Extremity Assessment: Generalized weakness;LUE deficits/detail LUE Deficits / Details: numbness and tingling in UE, worse in finger tips.  grossly 3/5 MMT.  testing within cervical prec.   Lower Extremity Assessment Lower Extremity Assessment: Defer to PT evaluation   Cervical / Trunk Assessment Cervical / Trunk Assessment: Neck Surgery   Communication Communication Communication: No apparent difficulties   Cognition Arousal: Alert Behavior During Therapy: WFL for tasks assessed/performed Cognition: History of cognitive impairments             OT - Cognition Comments: pt with hx of alzheimers, requires cueing throughout session to recall and follow cerivcal precautions; verbose and requires frequent redirection to task                 Following commands: Impaired Following commands impaired: Follows multi-step commands with increased time     Cueing  General Comments   Cueing Techniques: Verbal cues      Exercises     Shoulder Instructions      Home Living Family/patient expects to be discharged to:: Private residence Living Arrangements: Spouse/significant other Available Help at Discharge: Family;Available 24 hours/day Type of Home: House Home Access: Stairs to enter [employer] of Steps: 2 Entrance Stairs-Rails: Left Home Layout: Two level;Full bath on main level;Able to live on main level with bedroom/bathroom     Bathroom Shower/Tub: Producer, television/film/video: Standard     Home Equipment: Agricultural consultant (2 wheels);Shower seat;Grab bars - tub/shower          Prior Functioning/Environment Prior Level of Function : Independent/Modified  Independent             Mobility Comments: independent ADLs Comments: indepedent, light IADLs    OT Problem List: Decreased strength;Decreased activity tolerance;Impaired balance (sitting and/or standing);Pain;Impaired sensation;Decreased knowledge of precautions;Decreased knowledge of use of DME or AE;Decreased safety awareness;Decreased cognition   OT Treatment/Interventions: Self-care/ADL training;DME and/or AE instruction;Therapeutic activities;Patient/family education;Balance training      OT Goals(Current goals can be found in the care plan section)   Acute Rehab OT Goals Patient Stated Goal: home OT Goal Formulation: With patient Time For Goal Achievement: 09/13/23 Potential to Achieve Goals: Good   OT Frequency:  Min 2X/week    Co-evaluation              AM-PAC OT 6 Clicks Daily Activity     Outcome Measure Help from another person eating meals?: None Help from another person taking care of personal grooming?: A Little Help from another person toileting, which includes using toliet, bedpan, or urinal?: A Little Help from another person bathing (including washing, rinsing, drying)?: A Little Help from another person to put on and taking off regular upper body clothing?: A Little Help from another person to put on and taking off regular lower body clothing?: A Little 6 Click Score: 19   End of Session Equipment Utilized During Treatment: Cervical collar Nurse Communication: Mobility status;Precautions  Activity Tolerance: Patient tolerated treatment well Patient left: in chair;with call bell/phone within reach  OT Visit Diagnosis: Other abnormalities of gait and mobility (  R26.89);Muscle weakness (generalized) (M62.81);Pain Pain - part of body:  (neck)                Time: 9161-9085 OT Time Calculation (min): 36 min Charges:  OT General Charges $OT Visit: 1 Visit OT Evaluation $OT Eval Low Complexity: 1 Low OT Treatments $Self Care/Home Management :  8-22 mins  Etta NOVAK, OT Acute Rehabilitation Services Office [phone] Secure Chat Preferred    Etta GORMAN Hope 08/30/2023, 9:55 AM

## 2023-08-30 NOTE — Progress Notes (Signed)
 Patient awaiting family for discharge home, Patient in no acute distress nor complaints of pain nor discomfort; incision on back is clean, dry and intact; No c/o pain at this time. Room was checked and accounted for all patient's belongings; discharge instructions concerning her medications, incision care, follow up appointment and when to call the doctor as needed were all discussed with patient by RN and she expressed understanding on the instructions given.

## 2023-08-30 NOTE — Evaluation (Signed)
 Physical Therapy Evaluation Patient Details Name: Megan Williams MRN: [id number] DOB: August 02, 1945 Today's Date: 08/30/2023  History of Present Illness  Pt is a 78 yo female presenting on 6/30 for C3-4 ACDF with partial removal of previously placed plate R5-R2. PMH: osteopenia, hypothyroisidm, HTN, herpes simplex, emphysema, depression, COPD, arthritis, anxiety, alzheimer's, prior back and cervical surgery.  Clinical Impression   Pt admitted with above diagnosis. Lives at home with family, in a 2-level home with 2 steps to enter; Prior to admission, pt was able to manage ADLs and ambulation independently; Presents to PT with gait unsteadiness and decr awareness of precautions; Today, pt demonstrated ability to complete bed mobility with supervision, transfers with supervision, functional mobility with supervision, and ADLs with up to contact guard assist.   She requires max cueing for precautions adherence during session, with limited carryover.  She continues to have numbness/tingling in L UE but Is using UE functionally with increased time; she reports it is improving compared to prior to surgery. At discharge, pt will have support from spouse. Recommend HHPT for continuing reinforcement of cervical precautions in her everyday environment, and to work on endurance; Pt currently with functional limitations due to the deficits listed below (see PT Problem List). Pt will benefit from skilled PT to increase their independence and safety with mobility to allow discharge to the venue listed below.           If plan is discharge home, recommend the following: A little help with walking and/or transfers;A little help with bathing/dressing/bathroom;Assistance with cooking/housework;Assist for transportation;Help with stairs or ramp for entrance   Can travel by private vehicle        Equipment Recommendations None recommended by PT  Recommendations for Other Services       Functional Status Assessment  Patient has had a recent decline in their functional status and demonstrates the ability to make significant improvements in function in a reasonable and predictable amount of time.     Precautions / Restrictions Precautions Precautions: Fall;Cervical Precaution Booklet Issued: Yes (comment) Recall of Precautions/Restrictions: Impaired Precaution/Restrictions Comments: requires max cueing to adhere to cervical precautions functionally Required Braces or Orthoses: Cervical Brace Cervical Brace: Soft collar;At all times Restrictions Weight Bearing Restrictions Per Provider Order: No      Mobility  Bed Mobility               General bed mobility comments: in recliner upon PT arrival    Transfers Overall transfer level: Needs assistance Equipment used: 1 person hand held assist Transfers: Sit to/from Stand Sit to Stand: Contact guard assist           General transfer comment: Handheld assist for steadying point; cues for cervical precautions    Ambulation/Gait Ambulation/Gait assistance: Contact guard assist Gait Distance (Feet): 300 Feet Assistive device: 1 person hand held assist Gait Pattern/deviations: Step-through pattern       General Gait Details: Cues for cervical precautions; unilateral handheld assist (right and left UEs at different times) as needed; cues to self-monitor for activity tolerance  Stairs Stairs: Yes Stairs assistance: Contact guard assist Stair Management: One rail Left, Forwards (ascended alternating; descended with step-to pattern, LLE leading) Number of Stairs: 12 General stair comments: handheld assist to steady; good use of rail for support  Wheelchair Mobility     Tilt Bed    Modified Rankin (Stroke Patients Only)       Balance Overall balance assessment: Mild deficits observed, not formally tested  Pertinent Vitals/Pain Pain Assessment Pain Assessment:  Faces Faces Pain Scale: Hurts a little bit Pain Location: neck, incisional Pain Descriptors / Indicators: Discomfort, Operative site guarding Pain Intervention(s): Monitored during session    Home Living Family/patient expects to be discharged to:: Private residence Living Arrangements: Spouse/significant other Available Help at Discharge: Family;Available 24 hours/day Type of Home: House Home Access: Stairs to enter Entrance Stairs-Rails: Left Entrance Stairs-Number of Steps: 2   Home Layout: Two level;Full bath on main level;Able to live on main level with bedroom/bathroom Home Equipment: Rolling Walker (2 wheels);Shower seat;Grab bars - tub/shower      Prior Function Prior Level of Function : Independent/Modified Independent             Mobility Comments: independent ADLs Comments: indepedent, light IADLs     Extremity/Trunk Assessment   Upper Extremity Assessment Upper Extremity Assessment: Defer to OT evaluation LUE Deficits / Details: numbness and tingling in UE, worse in finger tips.  grossly 3/5 MMT.  testing within cervical prec.    Lower Extremity Assessment Lower Extremity Assessment: Generalized weakness (adequate strength to power up; decr muscle endurance)    Cervical / Trunk Assessment Cervical / Trunk Assessment: Neck Surgery  Communication   Communication Communication: No apparent difficulties    Cognition Arousal: Alert Behavior During Therapy: WFL for tasks assessed/performed                             Following commands: Intact       Cueing Cueing Techniques: Verbal cues (and modeling)     General Comments General comments (skin integrity, edema, etc.): Needs reinforcement of cervical precautions; tells me her husband and 51 yo grandson will provide assist    Exercises     Assessment/Plan    PT Assessment Patient needs continued PT services  PT Problem List Decreased strength;Decreased activity tolerance;Decreased  knowledge of precautions;Decreased safety awareness       PT Treatment Interventions DME instruction;Gait training;Stair training;Functional mobility training;Therapeutic activities;Therapeutic exercise;Balance training;Neuromuscular re-education;Cognitive remediation;Patient/family education    PT Goals (Current goals can be found in the Care Plan section)  Acute Rehab PT Goals Patient Stated Goal: Home soon PT Goal Formulation: With patient Time For Goal Achievement: 09/06/23 Potential to Achieve Goals: Good    Frequency Min 2X/week     Co-evaluation               AM-PAC PT 6 Clicks Mobility  Outcome Measure Help needed turning from your back to your side while in a flat bed without using bedrails?: A Little Help needed moving from lying on your back to sitting on the side of a flat bed without using bedrails?: A Little Help needed moving to and from a bed to a chair (including a wheelchair)?: A Little Help needed standing up from a chair using your arms (e.g., wheelchair or bedside chair)?: A Little Help needed to walk in hospital room?: A Little Help needed climbing 3-5 steps with a railing? : A Little 6 Click Score: 18    End of Session Equipment Utilized During Treatment: Gait belt Activity Tolerance: Patient tolerated treatment well Patient left: in chair;with call bell/phone within reach Nurse Communication: Mobility status;Other (comment) (ok for dc from PT standpoint) PT Visit Diagnosis: Unsteadiness on feet (R26.81)    Time: 9081-9064 PT Time Calculation (min) (ACUTE ONLY): 17 min   Charges:   PT Evaluation $PT Eval Low Complexity: 1 Low   PT General Charges $$  ACUTE PT VISIT: 1 Visit         Silvano Currier, PT  Acute Rehabilitation Services Office [phone] Secure Chat welcomed   Silvano VEAR Currier 08/30/2023, 10:02 AM

## 2023-08-30 NOTE — Plan of Care (Signed)
  Problem: Education: Goal: Knowledge of General Education information will improve Description: Including pain rating scale, medication(s)/side effects and non-pharmacologic comfort measures Outcome: Completed/Met   Problem: Health Behavior/Discharge Planning: Goal: Ability to manage health-related needs will improve Outcome: Completed/Met   Problem: Clinical Measurements: Goal: Ability to maintain clinical measurements within normal limits will improve Outcome: Completed/Met Goal: Will remain free from infection Outcome: Completed/Met Goal: Diagnostic test results will improve Outcome: Completed/Met Goal: Respiratory complications will improve Outcome: Completed/Met Goal: Cardiovascular complication will be avoided Outcome: Completed/Met   Problem: Activity: Goal: Risk for activity intolerance will decrease Outcome: Completed/Met   Problem: Nutrition: Goal: Adequate nutrition will be maintained Outcome: Completed/Met   Problem: Coping: Goal: Level of anxiety will decrease Outcome: Completed/Met   Problem: Elimination: Goal: Will not experience complications related to bowel motility Outcome: Completed/Met Goal: Will not experience complications related to urinary retention Outcome: Completed/Met   Problem: Pain Managment: Goal: General experience of comfort will improve and/or be controlled Outcome: Completed/Met   Problem: Safety: Goal: Ability to remain free from injury will improve Outcome: Completed/Met   Problem: Skin Integrity: Goal: Risk for impaired skin integrity will decrease Outcome: Completed/Met   Problem: Education: Goal: Ability to verbalize activity precautions or restrictions will improve Outcome: Completed/Met Goal: Knowledge of the prescribed therapeutic regimen will improve Outcome: Completed/Met Goal: Understanding of discharge needs will improve Outcome: Completed/Met   Problem: Activity: Goal: Ability to avoid complications of  mobility impairment will improve Outcome: Completed/Met Goal: Ability to tolerate increased activity will improve Outcome: Completed/Met Goal: Will remain free from falls Outcome: Completed/Met   Problem: Bowel/Gastric: Goal: Gastrointestinal status for postoperative course will improve Outcome: Completed/Met   Problem: Clinical Measurements: Goal: Ability to maintain clinical measurements within normal limits will improve Outcome: Completed/Met Goal: Postoperative complications will be avoided or minimized Outcome: Completed/Met Goal: Diagnostic test results will improve Outcome: Completed/Met   Problem: Pain Management: Goal: Pain level will decrease Outcome: Completed/Met   Problem: Skin Integrity: Goal: Will show signs of wound healing Outcome: Completed/Met   Problem: Health Behavior/Discharge Planning: Goal: Identification of resources available to assist in meeting health care needs will improve Outcome: Completed/Met

## 2023-09-05 DIAGNOSIS — E785 Hyperlipidemia, unspecified: Secondary | ICD-10-CM | POA: Diagnosis not present

## 2023-09-05 DIAGNOSIS — J439 Emphysema, unspecified: Secondary | ICD-10-CM | POA: Diagnosis not present

## 2023-09-05 DIAGNOSIS — E039 Hypothyroidism, unspecified: Secondary | ICD-10-CM | POA: Diagnosis not present

## 2023-09-05 DIAGNOSIS — M5010 Cervical disc disorder with radiculopathy, unspecified cervical region: Secondary | ICD-10-CM | POA: Diagnosis not present

## 2023-09-05 DIAGNOSIS — H9193 Unspecified hearing loss, bilateral: Secondary | ICD-10-CM | POA: Diagnosis not present

## 2023-09-05 DIAGNOSIS — I480 Paroxysmal atrial fibrillation: Secondary | ICD-10-CM | POA: Diagnosis not present

## 2023-09-05 DIAGNOSIS — G4700 Insomnia, unspecified: Secondary | ICD-10-CM | POA: Diagnosis not present

## 2023-09-05 DIAGNOSIS — M48062 Spinal stenosis, lumbar region with neurogenic claudication: Secondary | ICD-10-CM | POA: Diagnosis not present

## 2023-09-05 DIAGNOSIS — G43909 Migraine, unspecified, not intractable, without status migrainosus: Secondary | ICD-10-CM | POA: Diagnosis not present

## 2023-09-05 DIAGNOSIS — M1990 Unspecified osteoarthritis, unspecified site: Secondary | ICD-10-CM | POA: Diagnosis not present

## 2023-09-05 DIAGNOSIS — Z4789 Encounter for other orthopedic aftercare: Secondary | ICD-10-CM | POA: Diagnosis not present

## 2023-09-05 DIAGNOSIS — F02818 Dementia in other diseases classified elsewhere, unspecified severity, with other behavioral disturbance: Secondary | ICD-10-CM | POA: Diagnosis not present

## 2023-09-05 DIAGNOSIS — K219 Gastro-esophageal reflux disease without esophagitis: Secondary | ICD-10-CM | POA: Diagnosis not present

## 2023-09-05 DIAGNOSIS — I10 Essential (primary) hypertension: Secondary | ICD-10-CM | POA: Diagnosis not present

## 2023-09-05 DIAGNOSIS — G250 Essential tremor: Secondary | ICD-10-CM | POA: Diagnosis not present

## 2023-09-05 DIAGNOSIS — M4712 Other spondylosis with myelopathy, cervical region: Secondary | ICD-10-CM | POA: Diagnosis not present

## 2023-09-05 DIAGNOSIS — F0283 Dementia in other diseases classified elsewhere, unspecified severity, with mood disturbance: Secondary | ICD-10-CM | POA: Diagnosis not present

## 2023-09-05 DIAGNOSIS — F32A Depression, unspecified: Secondary | ICD-10-CM | POA: Diagnosis not present

## 2023-09-05 DIAGNOSIS — I700 Atherosclerosis of aorta: Secondary | ICD-10-CM | POA: Diagnosis not present

## 2023-09-05 DIAGNOSIS — M4722 Other spondylosis with radiculopathy, cervical region: Secondary | ICD-10-CM | POA: Diagnosis not present

## 2023-09-05 DIAGNOSIS — F17210 Nicotine dependence, cigarettes, uncomplicated: Secondary | ICD-10-CM | POA: Diagnosis not present

## 2023-09-05 DIAGNOSIS — M8580 Other specified disorders of bone density and structure, unspecified site: Secondary | ICD-10-CM | POA: Diagnosis not present

## 2023-09-05 DIAGNOSIS — F0284 Dementia in other diseases classified elsewhere, unspecified severity, with anxiety: Secondary | ICD-10-CM | POA: Diagnosis not present

## 2023-09-05 DIAGNOSIS — G309 Alzheimer's disease, unspecified: Secondary | ICD-10-CM | POA: Diagnosis not present

## 2023-09-08 ENCOUNTER — Telehealth: Payer: Self-pay | Admitting: Internal Medicine

## 2023-09-08 ENCOUNTER — Encounter: Payer: Self-pay | Admitting: Internal Medicine

## 2023-09-09 ENCOUNTER — Telehealth: Admitting: Internal Medicine

## 2023-09-09 DIAGNOSIS — G250 Essential tremor: Secondary | ICD-10-CM

## 2023-09-09 DIAGNOSIS — E039 Hypothyroidism, unspecified: Secondary | ICD-10-CM | POA: Diagnosis not present

## 2023-09-09 DIAGNOSIS — I10 Essential (primary) hypertension: Secondary | ICD-10-CM

## 2023-09-09 DIAGNOSIS — M4712 Other spondylosis with myelopathy, cervical region: Secondary | ICD-10-CM

## 2023-09-09 DIAGNOSIS — F419 Anxiety disorder, unspecified: Secondary | ICD-10-CM

## 2023-09-09 DIAGNOSIS — Z8679 Personal history of other diseases of the circulatory system: Secondary | ICD-10-CM | POA: Diagnosis not present

## 2023-09-09 DIAGNOSIS — H903 Sensorineural hearing loss, bilateral: Secondary | ICD-10-CM

## 2023-09-09 DIAGNOSIS — G3184 Mild cognitive impairment, so stated: Secondary | ICD-10-CM | POA: Diagnosis not present

## 2023-09-09 DIAGNOSIS — M4722 Other spondylosis with radiculopathy, cervical region: Secondary | ICD-10-CM

## 2023-09-09 DIAGNOSIS — F32A Depression, unspecified: Secondary | ICD-10-CM | POA: Diagnosis not present

## 2023-09-09 DIAGNOSIS — Z09 Encounter for follow-up examination after completed treatment for conditions other than malignant neoplasm: Secondary | ICD-10-CM | POA: Diagnosis not present

## 2023-09-09 NOTE — Progress Notes (Signed)
 Patient Care Team: Perri Ronal PARAS, MD as PCP - General (Internal Medicine) Santo Stanly LABOR, MD as PCP - Cardiology (Cardiology) Prentiss Annabella LABOR, NP as Nurse Practitioner (Gynecology)  I connected with Megan Williams on 09/09/23 at 12:00 PM by video enabled telemedicine visit and verified that I am speaking with the correct person using two identifiers, myself and [name], CMA. I am in my office and patient is in their home.    I discussed the limitations, risks, security and privacy concerns of performing an evaluation and management service by telemedicine and the availability of in-person appointments. I also discussed with the patient that there may be a patient responsible charge related to this service. The patient expressed understanding and agreed to proceed.   Other persons participating in the visit and their role in the encounter: Medical scribe, Damien Blanks  Patient's location: Home  Provider's location: Clinic   I provided 25 minutes of time spent during this encounter, including chart review, interviewing patient, medical decision making, and e-scribing medication with > 50% was spent counseling as documented under my assessment & plan.   Chief Complaint  Patient presents with   Hospitalization Follow-up   s/p cervical spine surgery   Subjective:  Patient PI:[name],Female DOB:04/09/1945,78 y.o. FMW:[id number]   78 y.o. Female presents today for hospital follow-up from 6/30 - 7/01. Patient has a past medical history of Anxiety/Depression, Arthritis, Atrial Fibrillation, COPD, Depression, Emphysema, Esophagitis, Herpes Simplex, Hypertension, Hypothyroidism, Migraines, Osteopenia, Vitamin D  Deficiency, and Mild Memory Loss. Seen in hospital by Dr. 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 According to Dr. Thayer note she tolerated this procedure well, and  she was provided with post-op instructions at discharge. Says that overall she is doing well post-op: she is managing her pain with Tylenol  every 8 hours, Methocarbamol  500 mg every 6 hours, but is having some numbness and tingling in her her left hand and finding difficulty with eating.   Past Medical History:  Diagnosis Date   Allergy    Alzheimer disease (HCC) 2020   per pt; takes Namenda    Anxiety    Arthritis    Atrial fibrillation (HCC)    Complication of anesthesia    difficulty waking up. I could hear them but I couldn't wake up   COPD (chronic obstructive pulmonary disease) (HCC)    Depression    Emphysema    Esophagitis    GERD (gastroesophageal reflux disease)    Herpes simplex    History of hiatal hernia    Hypertension    Hypothyroidism    Migraines    Osteopenia    Vitamin D  deficiency     Allergies  Allergen Reactions   Doxycycline Hives, Itching, Nausea And Vomiting and Other (See Comments)    Skin peeling, Stevens-Johnson Syndrome    Latex Rash, Hives and Itching   Morphine  Itching and Other (See Comments)    Confusion, Dizziness   Penicillins Hives    All over body    Grass Extracts [Gramineae Pollens] Cough and Itching    Other reaction(s): Eye Redness   Short Ragweed Pollen Ext Cough and Itching    Other reaction(s): Eye Redness, Other   Celebrex [Celecoxib] Other (See Comments)    Blistering, pain, swelling   Cephalosporins Hives    All over body   Codeine Nausea And Vomiting   Gabapentin     Causes TICS    Family  History  Adopted: Yes  Family history unknown: Yes   Social History   Social History Narrative   Lives at home with husband.   Right-handed.   1 cup caffeine daily.   Review of Systems  Musculoskeletal:  Positive for neck pain.  Neurological:  Positive for tingling (/numbness, left hand).  All other systems reviewed and are negative.    Objective:  Vitals: There were no vitals taken for this visit. Physical  Exam Vitals and nursing note reviewed.  Constitutional:      General: She is not in acute distress.    Appearance: Normal appearance. She is not toxic-appearing.     Interventions: Cervical collar in place.  HENT:     Head: Normocephalic and atraumatic.  Pulmonary:     Effort: Pulmonary effort is normal.  Skin:    General: Skin is warm and dry.  Neurological:     Mental Status: She is alert and oriented to person, place, and time. Mental status is at baseline.  Psychiatric:        Mood and Affect: Mood normal.        Behavior: Behavior normal.        Thought Content: Thought content normal.        Judgment: Judgment normal.     Results:  Studies Obtained And Personally Reviewed By Me: Labs:     Component Value Date/Time   NA 139 08/19/2023 1400   K 4.4 08/19/2023 1400   CL 104 08/19/2023 1400   CO2 29 08/19/2023 1400   GLUCOSE 101 (H) 08/19/2023 1400   BUN 9 08/19/2023 1400   CREATININE 1.00 08/19/2023 1400   CREATININE 1.32 (H) 10/28/2022 1141   CALCIUM  9.4 08/19/2023 1400   PROT 6.5 04/25/2023 1012   ALBUMIN 4.0 10/07/2016 0925   AST 18 04/25/2023 1012   ALT 9 04/25/2023 1012   ALKPHOS 49 10/07/2016 0925   BILITOT 0.3 04/25/2023 1012   GFRNONAA 58 (L) 08/19/2023 1400   GFRNONAA 52 (L) 05/20/2020 1619   GFRAA 60 05/20/2020 1619    Lab Results  Component Value Date   WBC 8.9 08/19/2023   HGB 13.8 08/19/2023   HCT 42.8 08/19/2023   MCV 95.7 08/19/2023   PLT 288 08/19/2023   Lab Results  Component Value Date   CHOL 130 04/25/2023   HDL 51 04/25/2023   LDLCALC 56 04/25/2023   TRIG 157 (H) 04/25/2023   CHOLHDL 2.5 04/25/2023   Lab Results  Component Value Date   HGBA1C 4.9 10/07/2016    Lab Results  Component Value Date   TSH 0.54 04/25/2023    Assessment & Plan:   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 Victory Gens for her decompression/fusion, which she tolerated  this well according to Dr. Thayer. Today pt says that overall she is doing well post-op: she is managing her pain with Tylenol  every 8 hours, Methocarbamol  500 mg every 6 hours, but is having some numbness and tingling in her her left hand and finding difficulty with eating. She seems to be doing well overall, will be starting PT soon. Continue to monitor at-home. Hx decompression L3-L4 December 2019 by Dr, Gens  Hx of GERD treated with PPI  Hx of mild memory loss- stable  Hyperlipidemia treated with fenofibrate  and rosuvastatin   Hypothyroidism treated with thyroid  replacement therapy  Hx of pericarditis  Osteopenia- has been treated with Prolia   Hx of vertigo  Hx of thoracic  compression fracture after a fall in the shower  Hx of depression- has seen O[facility]  Hx of smoking  Hx of overactive bladder treated by Dr. Gigi, Urologist in [place]  Return in 8 weeks (on 11/01/2023) for annual labs and then on 11/07/2023 for annual visit, or as needed.  I,Emily Lagle,acting as a Neurosurgeon for Ronal JINNY Hailstone, MD.,have documented all relevant documentation on the behalf of Ronal JINNY Hailstone, MD,as directed by  Ronal JINNY Hailstone, MD while in the presence of Ronal JINNY Hailstone, MD.   I, Ronal JINNY Hailstone, MD, have reviewed all documentation for this visit. The documentation on 09/24/23 for the exam, diagnosis, procedures, and orders are all accurate and complete.

## 2023-09-12 ENCOUNTER — Ambulatory Visit: Admitting: Nurse Practitioner

## 2023-09-24 NOTE — Patient Instructions (Signed)
 Medicare wellness visit due in September. We are glad you are recovering well from surgery. Continue current meds and follow up for annual visit in September.

## 2023-09-28 DIAGNOSIS — M5412 Radiculopathy, cervical region: Secondary | ICD-10-CM | POA: Diagnosis not present

## 2023-09-28 DIAGNOSIS — M47816 Spondylosis without myelopathy or radiculopathy, lumbar region: Secondary | ICD-10-CM | POA: Diagnosis not present

## 2023-09-28 DIAGNOSIS — M25552 Pain in left hip: Secondary | ICD-10-CM | POA: Diagnosis not present

## 2023-10-11 DIAGNOSIS — M4807 Spinal stenosis, lumbosacral region: Secondary | ICD-10-CM | POA: Diagnosis not present

## 2023-10-11 DIAGNOSIS — M47817 Spondylosis without myelopathy or radiculopathy, lumbosacral region: Secondary | ICD-10-CM | POA: Diagnosis not present

## 2023-10-11 DIAGNOSIS — M5135 Other intervertebral disc degeneration, thoracolumbar region: Secondary | ICD-10-CM | POA: Diagnosis not present

## 2023-10-11 DIAGNOSIS — M47816 Spondylosis without myelopathy or radiculopathy, lumbar region: Secondary | ICD-10-CM | POA: Diagnosis not present

## 2023-10-17 DIAGNOSIS — F3342 Major depressive disorder, recurrent, in full remission: Secondary | ICD-10-CM | POA: Diagnosis not present

## 2023-10-20 ENCOUNTER — Encounter: Payer: Self-pay | Admitting: Nurse Practitioner

## 2023-10-20 ENCOUNTER — Ambulatory Visit (INDEPENDENT_AMBULATORY_CARE_PROVIDER_SITE_OTHER): Admitting: Nurse Practitioner

## 2023-10-20 VITALS — BP 116/62 | HR 59 | Ht 61.0 in | Wt 117.0 lb

## 2023-10-20 DIAGNOSIS — M8580 Other specified disorders of bone density and structure, unspecified site: Secondary | ICD-10-CM

## 2023-10-20 DIAGNOSIS — B009 Herpesviral infection, unspecified: Secondary | ICD-10-CM

## 2023-10-20 DIAGNOSIS — Z79890 Hormone replacement therapy (postmenopausal): Secondary | ICD-10-CM | POA: Diagnosis not present

## 2023-10-20 DIAGNOSIS — Z9189 Other specified personal risk factors, not elsewhere classified: Secondary | ICD-10-CM | POA: Diagnosis not present

## 2023-10-20 DIAGNOSIS — Z01419 Encounter for gynecological examination (general) (routine) without abnormal findings: Secondary | ICD-10-CM

## 2023-10-20 MED ORDER — ESTRADIOL 1 MG PO TABS
1.0000 mg | ORAL_TABLET | Freq: Every day | ORAL | 3 refills | Status: AC
Start: 2023-10-20 — End: ?

## 2023-10-20 NOTE — Progress Notes (Signed)
 Megan Williams [birth year] [id number]   History:  78 y.o. G2P2002 presents for breast and pelvic exam without GYN complaints. Postmenopausal - on ERT. She has tried to wean but is unable to tolerate hot flashes. She still has occasional hot flashes on her current dose. Megan Williams for OAB. S/P 1979 TV[facility]. Normal pap and mammogram history. Osteopenia managed by PCP, on Prolia . Hypothyroidism and HLD also managed by PCP. Smokes 5 cigarettes per day. Limited mobility due to back pain. Had 2 surgeries last year, seeing spine specialist.  Gynecologic History No LMP recorded. Patient has had a hysterectomy.   Contraception: status post hysterectomy Sexually active: No  Health Maintenance Last Pap: 05/22/2012. Results were: Normal Last mammogram: 11/30/2022. Results were: Normal Last colonoscopy: 10/31/2016. Results were: Normal, 10-year recall Last Dexa: 05/05/2021. Results were: T-score -1.7  Past medical history, past surgical history, family history and social history were all reviewed and documented in the EPIC chart. Married. Retired. Son and daughter, live local. 3 grandsons, 1 granddaughter.   ROS:  A ROS was performed and pertinent positives and negatives are included.  Exam:  Vitals:   10/20/23 1057  BP: 116/62  Pulse: (!) 59  SpO2: 98%  Weight: 117 lb (53.1 kg)  Height: 5' 1 (1.549 m)     Body mass index is 22.11 kg/m.  General appearance:  Normal Thyroid :  Symmetrical, normal in size, without palpable masses or nodularity. Respiratory  Auscultation:  Clear without wheezing or rhonchi Cardiovascular  Auscultation:  Regular rate, without rubs, murmurs or gallops  Edema/varicosities:  Not grossly evident Abdominal  Soft,nontender, without masses, guarding or rebound.  Liver/spleen:  No organomegaly noted  Hernia:  None appreciated  Skin  Inspection:  Grossly normal. Numerous moles Breasts: Examined lying and sitting.   Right: Without masses, retractions, nipple discharge or  axillary adenopathy.   Left: Without masses, retractions, nipple discharge or axillary adenopathy. Pelvic: External genitalia:  no lesions              Urethra:  normal appearing urethra with no masses, tenderness or lesions              Bartholins and Skenes: normal                 Vagina: normal appearing vagina with normal color and discharge, no lesions. Atrophic changes              Cervix: absent Bimanual Exam:  Uterus:  absent              Adnexa: no mass, fullness, tenderness              Rectovaginal: Deferred              Anus:  normal, no lesions  Megan Williams, CMA present as chaperone.  Assessment/Plan:  78 y.o. [id number] for breast and pelvic exam.   Encounter for breast and pelvic examination - Education provided on SBEs, importance of preventative screenings, current guidelines, high calcium  diet, regular exercise, and multivitamin daily. Labs with PCP.   Postmenopausal hormone therapy - Plan: estradiol  (ESTRACE ) 1 MG tablet daily. She has tried to wean but is unable to tolerate hot flashes. She still has occasional hot flashes on her current dose. We again discussed the risks of continued use at her age and she would like to continue. Recommend trying to wean every 6-12 months. Refill x 1 year provided.   Osteopenia, unspecified location - Last Dexa 04/2021 T-score -1.7, on Prolia . DXA  schedule in November. Managed by PCP.   Screening for cervical cancer - Normal Pap history. No longer screening per guidleines.   Screening for breast cancer - Normal mammogram history.  Continue annual screenings.  Normal breast exam today.  Screening for colon cancer - 2018 colonoscopy. Will repeat at 10-year interval per GI's recommendation.   Return in about 1 year (around 10/19/2024) for Med follow up.    Megan DELENA Shutter DNP, 11:28 AM 10/20/2023

## 2023-10-24 ENCOUNTER — Telehealth: Payer: Self-pay

## 2023-10-24 ENCOUNTER — Other Ambulatory Visit (HOSPITAL_COMMUNITY): Payer: Self-pay

## 2023-10-24 NOTE — Telephone Encounter (Signed)
 Prolia  VOB initiated via [email or web address]  Next Prolia  inj DUE: 11/24/23

## 2023-10-25 ENCOUNTER — Other Ambulatory Visit: Payer: Self-pay | Admitting: Internal Medicine

## 2023-10-26 ENCOUNTER — Other Ambulatory Visit: Payer: Self-pay

## 2023-10-26 ENCOUNTER — Other Ambulatory Visit (HOSPITAL_COMMUNITY): Payer: Self-pay

## 2023-10-26 DIAGNOSIS — M47816 Spondylosis without myelopathy or radiculopathy, lumbar region: Secondary | ICD-10-CM | POA: Diagnosis not present

## 2023-10-26 NOTE — Telephone Encounter (Signed)
 Pt ready for scheduling for PROLIA  on or after : 11/24/23  Option# 1: Buy/Bill (Office supplied medication)  Out-of-pocket cost due at time of clinic visit: $332  Number of injection/visits approved: ---  Primary: [employer] Prolia  co-insurance: 20% Admin fee co-insurance: 0%  Secondary: --- Prolia  co-insurance:  Admin fee co-insurance:   Medical Benefit Details: Date Benefits were checked: 10/25/23 Deductible: NO/ Coinsurance: 20%/ Admin Fee: 0%  Prior Auth: N/A PA# Expiration Date:   # of doses approved: ----------------------------------------------------------------------- Option# 2- Med Obtained from pharmacy:  Pharmacy benefit: Copay $0 (Paid to pharmacy) Admin Fee: 0% (Pay at clinic)  Prior Auth: N/A PA# Expiration Date:   # of doses approved:   If patient wants fill through the pharmacy benefit please send prescription to: [facility], and include estimated need by date in rx notes. Pharmacy will ship medication directly to the office.  Patient NOT eligible for Prolia  Copay Card. Copay Card can make patient's cost as little as $25. Link to apply: [email or web address]  ** This summary of benefits is an estimation of the patient's out-of-pocket cost. Exact cost may very based on individual plan coverage.

## 2023-10-31 ENCOUNTER — Encounter: Payer: Self-pay | Admitting: Internal Medicine

## 2023-11-01 ENCOUNTER — Other Ambulatory Visit: Payer: PPO

## 2023-11-01 ENCOUNTER — Ambulatory Visit: Admitting: Internal Medicine

## 2023-11-01 DIAGNOSIS — M8580 Other specified disorders of bone density and structure, unspecified site: Secondary | ICD-10-CM | POA: Diagnosis not present

## 2023-11-01 DIAGNOSIS — Z0000 Encounter for general adult medical examination without abnormal findings: Secondary | ICD-10-CM

## 2023-11-01 DIAGNOSIS — F32A Depression, unspecified: Secondary | ICD-10-CM

## 2023-11-01 DIAGNOSIS — K219 Gastro-esophageal reflux disease without esophagitis: Secondary | ICD-10-CM

## 2023-11-01 DIAGNOSIS — M4712 Other spondylosis with myelopathy, cervical region: Secondary | ICD-10-CM | POA: Diagnosis not present

## 2023-11-01 DIAGNOSIS — M4722 Other spondylosis with radiculopathy, cervical region: Secondary | ICD-10-CM | POA: Diagnosis not present

## 2023-11-01 DIAGNOSIS — I10 Essential (primary) hypertension: Secondary | ICD-10-CM

## 2023-11-01 DIAGNOSIS — E039 Hypothyroidism, unspecified: Secondary | ICD-10-CM | POA: Diagnosis not present

## 2023-11-01 DIAGNOSIS — F419 Anxiety disorder, unspecified: Secondary | ICD-10-CM | POA: Diagnosis not present

## 2023-11-02 LAB — COMPLETE METABOLIC PANEL WITHOUT GFR
AG Ratio: 1.6 (calc) (ref 1.0–2.5)
ALT: 9 U/L (ref 6–29)
AST: 20 U/L (ref 10–35)
Albumin: 4.3 g/dL (ref 3.6–5.1)
Alkaline phosphatase (APISO): 36 U/L — ABNORMAL LOW (ref 37–153)
BUN/Creatinine Ratio: 10 (calc) (ref 6–22)
BUN: 10 mg/dL (ref 7–25)
CO2: 32 mmol/L (ref 20–32)
Calcium: 10.1 mg/dL (ref 8.6–10.4)
Chloride: 107 mmol/L (ref 98–110)
Creat: 1.04 mg/dL — ABNORMAL HIGH (ref 0.60–1.00)
Globulin: 2.7 g/dL (ref 1.9–3.7)
Glucose, Bld: 99 mg/dL (ref 65–99)
Potassium: 4.3 mmol/L (ref 3.5–5.3)
Sodium: 143 mmol/L (ref 135–146)
Total Bilirubin: 0.6 mg/dL (ref 0.2–1.2)
Total Protein: 7 g/dL (ref 6.1–8.1)

## 2023-11-02 LAB — CBC WITH DIFFERENTIAL/PLATELET
Absolute Lymphocytes: 2225 {cells}/uL (ref 850–3900)
Absolute Monocytes: 370 {cells}/uL (ref 200–950)
Basophils Absolute: 39 {cells}/uL (ref 0–200)
Basophils Relative: 0.5 %
Eosinophils Absolute: 23 {cells}/uL (ref 15–500)
Eosinophils Relative: 0.3 %
HCT: 43.2 % (ref 35.0–45.0)
Hemoglobin: 13.6 g/dL (ref 11.7–15.5)
MCH: 31 pg (ref 27.0–33.0)
MCHC: 31.5 g/dL — ABNORMAL LOW (ref 32.0–36.0)
MCV: 98.4 fL (ref 80.0–100.0)
MPV: 9.9 fL (ref 7.5–12.5)
Monocytes Relative: 4.8 %
Neutro Abs: 5044 {cells}/uL (ref 1500–7800)
Neutrophils Relative %: 65.5 %
Platelets: 272 Thousand/uL (ref 140–400)
RBC: 4.39 Million/uL (ref 3.80–5.10)
RDW: 13.3 % (ref 11.0–15.0)
Total Lymphocyte: 28.9 %
WBC: 7.7 Thousand/uL (ref 3.8–10.8)

## 2023-11-02 LAB — LIPID PANEL
Cholesterol: 145 mg/dL (ref <200)
HDL: 56 mg/dL (ref >=50)
LDL Cholesterol (Calc): 68 mg/dL
Non-HDL Cholesterol (Calc): 89 mg/dL (ref <130)
Total CHOL/HDL Ratio: 2.6 (calc) (ref <5.0)
Triglycerides: 131 mg/dL (ref <150)

## 2023-11-02 LAB — TSH: TSH: 1.01 m[IU]/L (ref 0.40–4.50)

## 2023-11-07 ENCOUNTER — Telehealth: Payer: Self-pay | Admitting: Internal Medicine

## 2023-11-07 ENCOUNTER — Encounter: Payer: Self-pay | Admitting: Internal Medicine

## 2023-11-07 ENCOUNTER — Ambulatory Visit: Payer: PPO | Admitting: Internal Medicine

## 2023-11-07 VITALS — BP 110/80 | HR 63 | Ht 61.0 in | Wt 113.0 lb

## 2023-11-07 DIAGNOSIS — H903 Sensorineural hearing loss, bilateral: Secondary | ICD-10-CM

## 2023-11-07 DIAGNOSIS — E039 Hypothyroidism, unspecified: Secondary | ICD-10-CM | POA: Diagnosis not present

## 2023-11-07 DIAGNOSIS — M8580 Other specified disorders of bone density and structure, unspecified site: Secondary | ICD-10-CM

## 2023-11-07 DIAGNOSIS — E785 Hyperlipidemia, unspecified: Secondary | ICD-10-CM | POA: Diagnosis not present

## 2023-11-07 DIAGNOSIS — R413 Other amnesia: Secondary | ICD-10-CM

## 2023-11-07 DIAGNOSIS — Z981 Arthrodesis status: Secondary | ICD-10-CM

## 2023-11-07 DIAGNOSIS — Z9889 Other specified postprocedural states: Secondary | ICD-10-CM

## 2023-11-07 DIAGNOSIS — F419 Anxiety disorder, unspecified: Secondary | ICD-10-CM | POA: Diagnosis not present

## 2023-11-07 DIAGNOSIS — Z0000 Encounter for general adult medical examination without abnormal findings: Secondary | ICD-10-CM

## 2023-11-07 DIAGNOSIS — M48062 Spinal stenosis, lumbar region with neurogenic claudication: Secondary | ICD-10-CM

## 2023-11-07 DIAGNOSIS — I10 Essential (primary) hypertension: Secondary | ICD-10-CM

## 2023-11-07 DIAGNOSIS — I700 Atherosclerosis of aorta: Secondary | ICD-10-CM

## 2023-11-07 DIAGNOSIS — G250 Essential tremor: Secondary | ICD-10-CM

## 2023-11-07 DIAGNOSIS — M545 Low back pain, unspecified: Secondary | ICD-10-CM

## 2023-11-07 DIAGNOSIS — G3184 Mild cognitive impairment, so stated: Secondary | ICD-10-CM

## 2023-11-07 DIAGNOSIS — M170 Bilateral primary osteoarthritis of knee: Secondary | ICD-10-CM

## 2023-11-07 DIAGNOSIS — Z8679 Personal history of other diseases of the circulatory system: Secondary | ICD-10-CM

## 2023-11-07 DIAGNOSIS — M47816 Spondylosis without myelopathy or radiculopathy, lumbar region: Secondary | ICD-10-CM | POA: Diagnosis not present

## 2023-11-07 DIAGNOSIS — K219 Gastro-esophageal reflux disease without esophagitis: Secondary | ICD-10-CM

## 2023-11-07 DIAGNOSIS — J449 Chronic obstructive pulmonary disease, unspecified: Secondary | ICD-10-CM

## 2023-11-07 NOTE — Progress Notes (Unsigned)
 Subjective:   Megan Williams is a 78 y.o. female who presents for Medicare Annual (Subsequent) preventive examination.  Visit Complete: In person  Patient Medicare AWV questionnaire was completed by the patient on 11/07/2023; I have confirmed that all information answered by patient is correct and no changes since this date.  Cardiac Risk Factors include: advanced age (>72men, >23 women);dyslipidemia;hypertension     Objective:    Today's Vitals   11/07/23 1121  BP: 110/80  Pulse: 63  SpO2: 98%  Weight: 113 lb (51.3 kg)  Height: 5' 1 (1.549 m)  PainSc: 5   PainLoc: Back   Body mass index is 21.35 kg/m.     11/07/2023   11:20 AM 08/19/2023    1:24 PM 01/03/2023    9:55 AM 12/30/2022   11:23 AM 10/28/2022   11:38 AM 10/26/2021    3:23 PM 10/29/2020    3:00 PM  Advanced Directives  Does Patient Have a Medical Advance Directive? Yes Yes No No Yes Yes Yes  Type of Advance Directive Living will;Healthcare Power of Attorney Living will;Healthcare Power of Attorney   Living will;Healthcare Power of [employer] Power of [place];Living will Healthcare Power of [place];Living will  Does patient want to make changes to medical advance directive?  No - Guardian declined    No - Patient declined No - Patient declined  Copy of Healthcare Power of Attorney in Chart? No - copy requested No - copy requested   No - copy requested No - copy requested No - copy requested  Would patient like information on creating a medical advance directive?   No - Patient declined No - Patient declined       Current Medications (verified) Outpatient Encounter Medications as of 11/07/2023  Medication Sig   acetaminophen  (TYLENOL ) 650 MG CR tablet Take 1,300 mg by mouth 2 (two) times daily.   ARTIFICIAL TEAR SOLUTION OP Place 1 drop into both eyes 3 (three) times daily as needed (dry eyes). Liquid gel   aspirin EC 81 MG tablet Take 81 mg by mouth daily. Swallow whole.   cholecalciferol (VITAMIN D3) 25  MCG (1000 UNIT) tablet Take 1,000 Units by mouth daily.   cyanocobalamin  (VITAMIN B12) 1000 MCG tablet Take 2,000 mcg by mouth daily. Super under the tongue   diazepam  (VALIUM ) 10 MG tablet Take 10 mg by mouth 3 (three) times daily as needed for anxiety (Stress).   estradiol  (ESTRACE ) 1 MG tablet Take 1 tablet (1 mg total) by mouth daily.   fenofibrate  160 MG tablet TAKE 1 TABLET BY MOUTH DAILY.   fexofenadine (ALLEGRA) 60 MG tablet Take 60 mg by mouth daily.   fluticasone  (FLONASE ) 50 MCG/ACT nasal spray Place 1 spray into both nostrils daily.   levothyroxine  (SYNTHROID ) 88 MCG tablet TAKE 1 TABLET (88 MCG TOTAL) BY MOUTH DAILY.   Melatonin 10 MG TABS Take 10 mg by mouth at bedtime. sublingual   memantine  (NAMENDA ) 10 MG tablet Take 1 tablet (10 mg total) by mouth 2 (two) times daily. TAKE 10 mg TABLET BY MOUTH 2 TIMES DAILY.   methocarbamol  (ROBAXIN ) 500 MG tablet Take 1 tablet (500 mg total) by mouth every 6 (six) hours as needed for muscle spasms.   Multiple Vitamins-Minerals (MULTIVITAMIN WITH MINERALS) tablet Take 1 tablet by mouth daily. Alive 50+   Olopatadine  HCl (PATADAY  OP) Place 1 drop into both eyes 2 (two) times daily as needed (allergies). Extra Strength   oxyCODONE -acetaminophen  (PERCOCET) 5-325 MG tablet Take 1 tablet by  mouth every 4 (four) hours as needed for moderate pain (pain score 4-6) or severe pain (pain score 7-10).   pantoprazole  (PROTONIX ) 40 MG tablet TAKE 1 TABLET BY MOUTH DAILY.   propranolol  (INDERAL ) 40 MG tablet Take 1 tablet (40 mg total) by mouth 2 (two) times daily.   RESTASIS  0.05 % ophthalmic emulsion Place 1 drop into both eyes 2 (two) times daily.   rOPINIRole  (REQUIP ) 0.5 MG tablet Take 0.5 mg by mouth at bedtime.   rosuvastatin  (CRESTOR ) 5 MG tablet Take 1 tablet (5 mg total) by mouth daily.   traZODone  (DESYREL ) 100 MG tablet Take 200 mg by mouth at bedtime.   valACYclovir  (VALTREX ) 500 MG tablet TAKE 1 TABLET BY MOUTH TWICE A DAY FOR 5 DAYS (Patient  taking differently: Take 500 mg by mouth daily as needed (Shingles).)   venlafaxine  XR (EFFEXOR -XR) 150 MG 24 hr capsule Take 300 mg by mouth daily with breakfast.   Vibegron (GEMTESA) 75 MG TABS Take 75 mg by mouth daily.   No facility-administered encounter medications on file as of 11/07/2023.    Allergies (verified) Doxycycline, Latex, Morphine , Penicillins, Grass extracts [gramineae pollens], Short ragweed pollen ext, Celebrex [celecoxib], Cephalosporins, Codeine, and Gabapentin   History: Past Medical History:  Diagnosis Date   Allergy    Alzheimer disease (HCC) 2020   per pt; takes Namenda    Anxiety    Arthritis    Atrial fibrillation (HCC)    Complication of anesthesia    difficulty waking up. I could hear them but I couldn't wake up   COPD (chronic obstructive pulmonary disease) (HCC)    Depression    Emphysema    Esophagitis    GERD (gastroesophageal reflux disease)    Herpes simplex    History of hiatal hernia    Hypertension    Hypothyroidism    Migraines    Osteopenia    Vitamin D  deficiency    Past Surgical History:  Procedure Laterality Date   ANTERIOR CERVICAL DECOMP/DISCECTOMY FUSION N/A 08/29/2023   Procedure: ANTERIOR CERVICAL DECOMPRESSION/DISCECTOMY FUSION cervical three-four;  Surgeon: Colon Shove, MD;  Location: [facility] OR;  Service: Neurosurgery;  Laterality: N/A;  ACDF C34,  REMOVE PLATE   BACK SURGERY  02/14/2018   lumbar 3-4  fusion   BREAST EXCISIONAL BIOPSY Left 1979   Benign    CATARACT EXTRACTION, BILATERAL     CERVICAL FUSION     CHOLECYSTECTOMY     KYPHOPLASTY  09/19/2015   Dr. Colon  T12   LUMBAR DISC SURGERY  11/11/2017   L3-4   Thumb surg     THYROIDECTOMY     TONSILLECTOMY     VAGINAL HYSTERECTOMY     Family History  Adopted: Yes  Family history unknown: Yes   Social History   Socioeconomic History   Marital status: Married    Spouse name: Not on file   Number of children: 2   Years of education: 14   Highest education  level: Associate degree: occupational, Scientist, product/process development, or vocational program  Occupational History   Occupation: Retired  Tobacco Use   Smoking status: Every Day    Current packs/day: 0.50    Average packs/day: 0.5 packs/day for 57.7 years (28.8 ttl pk-yrs)    Types: Cigarettes    Start date: 1968   Smokeless tobacco: Never   Tobacco comments:    5  cigarettes daily  Vaping Use   Vaping status: Never Used  Substance and Sexual Activity   Alcohol  use: Yes  Alcohol /week: 1.0 standard drink of alcohol     Types: 1 Glasses of wine per week    Comment: Rarely   Drug use: No   Sexual activity: Not Currently    Birth control/protection: Surgical    Comment: INTERCOURSE AGE 36, SEXUAL PARTNERS LESS THAN 5  Other Topics Concern   Not on file  Social History Narrative   Lives at home with husband.   Right-handed.   1 cup caffeine daily.   Social Drivers of Corporate investment banker Strain: Low Risk  (11/07/2023)   Overall Financial Resource Strain (CARDIA)    Difficulty of Paying Living Expenses: Not very hard  Recent Concern: Financial Resource Strain - Medium Risk (11/04/2023)   Overall Financial Resource Strain (CARDIA)    Difficulty of Paying Living Expenses: Somewhat hard  Food Insecurity: No Food Insecurity (11/07/2023)   Hunger Vital Sign    Worried About Running Out of Food in the Last Year: Never true    Ran Out of Food in the Last Year: Never true  Transportation Needs: No Transportation Needs (11/07/2023)   PRAPARE - Administrator, Civil Service (Medical): No    Lack of Transportation (Non-Medical): No  Physical Activity: Inactive (11/07/2023)   Exercise Vital Sign    Days of Exercise per Week: 0 days    Minutes of Exercise per Session: 20 min  Stress: Stress Concern Present (11/07/2023)   [employer] of Occupational Health - Occupational Stress Questionnaire    Feeling of Stress: To some extent  Social Connections: Socially Integrated (11/07/2023)   Social  Connection and Isolation Panel    Frequency of Communication with Friends and Family: More than three times a week    Frequency of Social Gatherings with Friends and Family: Three times a week    Attends Religious Services: More than 4 times per year    Active Member of Clubs or Organizations: Yes    Attends Engineer, structural: More than 4 times per year    Marital Status: Married    Tobacco Counseling Ready to quit: Not Answered Counseling given: Not Answered Tobacco comments: 5  cigarettes daily   Clinical Intake:     Pain Score: 5                   Activities of Daily Living    11/07/2023   11:16 AM 11/04/2023    1:49 PM  In your present state of health, do you have any difficulty performing the following activities:  Hearing? 0 0  Vision? 0 0  Difficulty concentrating or making decisions? 0 0  Walking or climbing stairs? 0 0  Dressing or bathing? 0 0  Doing errands, shopping? 0 0  Preparing Food and eating ? N N  Using the Toilet? N N  In the past six months, have you accidently leaked urine? Y Y  Do you have problems with loss of bowel control? Y Y  Managing your Medications? N N  Managing your Finances? N N  Housekeeping or managing your Housekeeping? Megan Williams Megan Williams    Patient Care Team: Perri Ronal PARAS, MD as PCP - General (Internal Medicine) Santo Stanly LABOR, MD as PCP - Cardiology (Cardiology) Prentiss Annabella LABOR, NP as Nurse Practitioner (Gynecology)  Indicate any recent Medical Services you may have received from other than [facility] providers in the past year (date may be approximate).     Assessment:   This is a routine wellness examination for Megan Williams.  Hearing/Vision  screen No results found.   Goals Addressed   None    Depression Screen    11/07/2023   11:22 AM 10/20/2023   11:05 AM 07/15/2022    2:46 PM 06/17/2022    2:37 PM 04/26/2022   10:38 AM 04/08/2022   12:22 PM 03/22/2022   12:02 PM  PHQ 2/9 Scores  PHQ - 2 Score 0 1 0 0 0 0 0     Fall Risk    11/07/2023   11:17 AM 11/04/2023    1:49 PM 11/03/2023    8:58 AM 10/20/2023   11:04 AM 10/28/2022   11:38 AM  Fall Risk   Falls in the past year? 0 0 0 0 1  Number falls in past yr: 0   0 1  Injury with Fall? 0   0 1  Risk for fall due to : No Fall Risks   No Fall Risks Other (Comment)  Follow up Education provided;Falls evaluation completed;Falls prevention discussed   Falls evaluation completed Falls evaluation completed;Education provided;Falls prevention discussed    MEDICARE RISK AT HOME: Medicare Risk at Home Any stairs in or around the home?: Yes If so, are there any without handrails?: Yes Home free of loose throw rugs in walkways, pet beds, electrical cords, etc?: Yes Adequate lighting in your home to reduce risk of falls?: Yes Life alert?: No Use of a cane, walker or w/c?: No Grab bars in the bathroom?: Yes Shower chair or bench in shower?: Yes Elevated toilet seat or a handicapped toilet?: Yes  TIMED UP AND GO:  Was the test performed?  No    Cognitive Function:    06/02/2023   10:37 AM 06/01/2022   10:47 AM 05/27/2021   10:27 AM 05/21/2020    9:57 AM 05/22/2019    1:53 PM  MMSE - Mini Mental State Exam  Orientation to time 5 5 5 5 5   Orientation to Place 5 5 5 5 5   Registration 3 3 3 3 3   Attention/ Calculation 5 5 5 5 5   Recall 3 3 3 3 3   Language- name 2 objects 2 2 2 2 2   Language- repeat 1 1 1 1 1   Language- follow 3 step command 3 3 3 3 3   Language- read & follow direction 1 1 1 1 1   Write a sentence 1 1 1 1 1   Copy design 1 1 1 1 1   Total score 30 30 30 30 30         11/07/2023   11:23 AM 10/28/2022   11:37 AM 10/26/2021    3:29 PM  6CIT Screen  What Year? 0 points 0 points 0 points  What month? 0 points 0 points 0 points  What time? 0 points 0 points 0 points  Count back from 20 0 points 0 points 0 points  Months in reverse 0 points 0 points 0 points  Repeat phrase 0 points 0 points 0 points  Total Score 0 points 0 points 0 points     Immunizations Immunization History  Administered Date(s) Administered   Fluad Quad(high Dose 65+) 11/16/2021   INFLUENZA, HIGH DOSE SEASONAL PF 12/30/2017, 12/03/2022   Influenza,inj,Quad PF,6+ Mos 12/22/2012, 12/01/2015, 12/23/2016, 10/17/2018, 01/09/2020   Influenza-Unspecified 12/29/2017, 10/05/2019, 02/02/2021   PFIZER(Purple Top)SARS-COV-2 Vaccination 04/23/2019, 05/14/2019, 01/19/2020   PNEUMOCOCCAL CONJUGATE-20 10/26/2021   Pneumococcal Conjugate-13 06/25/2014   Pneumococcal Polysaccharide-23 08/07/2012   Tdap 04/23/2008, 07/18/2018, 06/17/2022   Zoster Recombinant(Shingrix) 09/08/2021, 11/16/2021   Zoster, Live 05/07/2010  Zoster, Unspecified 04/04/2020    TDAP status: Up to date  Flu Vaccine status: Due, Education has been provided regarding the importance of this vaccine. Advised may receive this vaccine at local pharmacy or Health Dept. Aware to provide a copy of the vaccination record if obtained from local pharmacy or Health Dept. Verbalized acceptance and understanding.  Pneumococcal vaccine status: Up to date  Covid-19 vaccine status: Information provided on how to obtain vaccines.   Qualifies for Shingles Vaccine? Yes   Zostavax completed Yes   Shingrix Completed?: Yes  Screening Tests Health Maintenance  Topic Date Due   Influenza Vaccine  09/30/2023   Lung Cancer Screening  11/06/2024 (Originally 10/28/2021)   Medicare Annual Wellness (AWV)  11/06/2024   DTaP/Tdap/Td (4 - Td or Tdap) 06/16/2032   Pneumococcal Vaccine: 50+ Years  Completed   DEXA SCAN  Completed   Hepatitis C Screening  Completed   Zoster Vaccines- Shingrix  Completed   HPV VACCINES  Aged Out   Meningococcal B Vaccine  Aged Out   Colonoscopy  Discontinued   COVID-19 Vaccine  Discontinued    Health Maintenance  Health Maintenance Due  Topic Date Due   Influenza Vaccine  09/30/2023    Colorectal cancer screening: Type of screening: Colonoscopy. Completed 11/26/2016. Repeat  every 10 years  Mammogram status: Completed 11/30/2022. Repeat every year  Bone Density status: Completed 05/05/2021. Results reflect: Bone density results: OSTEOPENIA. Repeat every patient is scheduled on 12/01/2023, 2  years.  Lung Cancer Screening: (Low Dose CT Chest recommended if Age 13-80 years, 20 pack-year currently smoking OR have quit w/in 15years.) does not qualify.   Additional Screening:  Hepatitis C Screening: does not qualify; Completed   Vision Screening: Recommended annual ophthalmology exams for early detection of glaucoma and other disorders of the eye. Is the patient up to date with their annual eye exam?  Yes  Who is the provider or what is the name of the office in which the patient attends annual eye exams? [facility] ophthalmology  If pt is not established with a provider, would they like to be referred to a provider to establish care? No .   Dental Screening: Recommended annual dental exams for proper oral hygiene  Diabetic Foot Exam: Diabetic Foot Exam: Completed 11/07/2023  Community Resource Referral / Chronic Care Management: CRR required this visit?  No   CCM required this visit?  No     Plan:     I have personally reviewed and noted the following in the patient's chart:   Medical and social history Use of alcohol , tobacco or illicit drugs  Current medications and supplements including opioid prescriptions. Patient is currently taking opioid prescriptions. Information provided to patient regarding non-opioid alternatives. Patient advised to discuss non-opioid treatment plan with their provider. Functional ability and status Nutritional status Physical activity Advanced directives List of other physicians Hospitalizations, surgeries, and ER visits in previous 12 months Vitals Screenings to include cognitive, depression, and falls Referrals and appointments  In addition, I have reviewed and discussed with patient certain  preventive protocols, quality metrics, and best practice recommendations. A written personalized care plan for preventive services as well as general preventive health recommendations were provided to patient.     Araceli Zelda, CMA   11/07/2023   After Visit Summary: (In Person-Printed) AVS printed and given to the patient  I, Ronal JINNY Hailstone, MD, have reviewed all documentation for this visit. The documentation on 11/07/2023 for the exam, diagnosis, procedures, and  orders are all accurate and complete.   I have reviewed and agree with the above Annual Wellness Visit documentation.  Ronal Norleen Hailstone, MD. Internal Medicine 11/07/2023

## 2023-11-07 NOTE — Patient Instructions (Signed)
 Next appointment: Follow up in one year for your annual wellness visit    Preventive Care 76 Years and Older, Female Preventive care refers to lifestyle choices and visits with your health care provider that can promote health and wellness. What does preventive care include? A yearly physical exam. This is also called an annual well check. Dental exams once or twice a year. Routine eye exams. Ask your health care provider how often you should have your eyes checked. Personal lifestyle choices, including: Daily care of your teeth and gums. Regular physical activity. Eating a healthy diet. Avoiding tobacco and drug use. Limiting alcohol use. Practicing safe sex. Taking low-dose aspirin every day. Taking vitamin and mineral supplements as recommended by your health care provider. What happens during an annual well check? The services and screenings done by your health care provider during your annual well check will depend on your age, overall health, lifestyle risk factors, and family history of disease. Counseling  Your health care provider may ask you questions about your: Alcohol use. Tobacco use. Drug use. Emotional well-being. Home and relationship well-being. Sexual activity. Eating habits. History of falls. Memory and ability to understand (cognition). Work and work Astronomer. Reproductive health. Screening  You may have the following tests or measurements: Height, weight, and BMI. Blood pressure. Lipid and cholesterol levels. These may be checked every 5 years, or more frequently if you are over 17 years old. Skin check. Lung cancer screening. You may have this screening every year starting at age 76 if you have a 30-pack-year history of smoking and currently smoke or have quit within the past 15 years. Fecal occult blood test (FOBT) of the stool. You may have this test every year starting at age 76 Flexible sigmoidoscopy or colonoscopy. You may have a  sigmoidoscopy every 5 years or a colonoscopy every 10 years starting at age 76. Hepatitis C blood test. Hepatitis B blood test. Sexually transmitted disease (STD) testing. Diabetes screening. This is done by checking your blood sugar (glucose) after you have not eaten for a while (fasting). You may have this done every 1-3 years. Bone density scan. This is done to screen for osteoporosis. You may have this done starting at age 76. Mammogram. This may be done every 1-2 years. Talk to your health care provider about how often you should have regular mammograms. Talk with your health care provider about your test results, treatment options, and if necessary, the need for more tests. Vaccines  Your health care provider may recommend certain vaccines, such as: Influenza vaccine. This is recommended every year. Tetanus, diphtheria, and acellular pertussis (Tdap, Td) vaccine. You may need a Td booster every 10 years. Zoster vaccine. You may need this after age 76. Pneumococcal 13-valent conjugate (PCV13) vaccine. One dose is recommended after age 18. Pneumococcal polysaccharide (PPSV23) vaccine. One dose is recommended after age 25. Talk to your health care provider about which screenings and vaccines you need and how often you need them. This information is not intended to replace advice given to you by your health care provider. Make sure you discuss any questions you have with your health care provider. Document Released: 03/14/2015 Document Revised: 11/05/2015 Document Reviewed: 12/17/2014 Elsevier Interactive Patient Education  2017 [employer].  Fall Prevention in the Home Falls can cause injuries. They can happen to people of all ages. There are many things you can do to make your home safe and to help prevent falls. What can I do on the outside of  my home? Regularly fix the edges of walkways and driveways and fix any cracks. Remove anything that might make you trip as you walk through a  door, such as a raised step or threshold. Trim any bushes or trees on the path to your home. Use bright outdoor lighting. Clear any walking paths of anything that might make someone trip, such as rocks or tools. Regularly check to see if handrails are loose or broken. Make sure that both sides of any steps have handrails. Any raised decks and porches should have guardrails on the edges. Have any leaves, snow, or ice cleared regularly. Use sand or salt on walking paths during winter. Clean up any spills in your garage right away. This includes oil or grease spills. What can I do in the bathroom? Use night lights. Install grab bars by the toilet and in the tub and shower. Do not use towel bars as grab bars. Use non-skid mats or decals in the tub or shower. If you need to sit down in the shower, use a plastic, non-slip stool. Keep the floor dry. Clean up any water that spills on the floor as soon as it happens. Remove soap buildup in the tub or shower regularly. Attach bath mats securely with double-sided non-slip rug tape. Do not have throw rugs and other things on the floor that can make you trip. What can I do in the bedroom? Use night lights. Make sure that you have a light by your bed that is easy to reach. Do not use any sheets or blankets that are too big for your bed. They should not hang down onto the floor. Have a firm chair that has side arms. You can use this for support while you get dressed. Do not have throw rugs and other things on the floor that can make you trip. What can I do in the kitchen? Clean up any spills right away. Avoid walking on wet floors. Keep items that you use a lot in easy-to-reach places. If you need to reach something above you, use a strong step stool that has a grab bar. Keep electrical cords out of the way. Do not use floor polish or wax that makes floors slippery. If you must use wax, use non-skid floor wax. Do not have throw rugs and other things  on the floor that can make you trip. What can I do with my stairs? Do not leave any items on the stairs. Make sure that there are handrails on both sides of the stairs and use them. Fix handrails that are broken or loose. Make sure that handrails are as long as the stairways. Check any carpeting to make sure that it is firmly attached to the stairs. Fix any carpet that is loose or worn. Avoid having throw rugs at the top or bottom of the stairs. If you do have throw rugs, attach them to the floor with carpet tape. Make sure that you have a light switch at the top of the stairs and the bottom of the stairs. If you do not have them, ask someone to add them for you. What else can I do to help prevent falls? Wear shoes that: Do not have high heels. Have rubber bottoms. Are comfortable and fit you well. Are closed at the toe. Do not wear sandals. If you use a stepladder: Make sure that it is fully opened. Do not climb a closed stepladder. Make sure that both sides of the stepladder are locked into place. Ask  someone to hold it for you, if possible. Clearly mark and make sure that you can see: Any grab bars or handrails. First and last steps. Where the edge of each step is. Use tools that help you move around (mobility aids) if they are needed. These include: Canes. Walkers. Scooters. Crutches. Turn on the lights when you go into a dark area. Replace any light bulbs as soon as they burn out. Set up your furniture so you have a clear path. Avoid moving your furniture around. If any of your floors are uneven, fix them. If there are any pets around you, be aware of where they are. Review your medicines with your doctor. Some medicines can make you feel dizzy. This can increase your chance of falling. Ask your doctor what other things that you can do to help prevent falls. This information is not intended to replace advice given to you by your health care provider. Make sure you discuss any  questions you have with your health care provider. Document Released: 12/12/2008 Document Revised: 07/24/2015 Document Reviewed: 03/22/2014 Elsevier Interactive Patient Education  2017 [employer].

## 2023-11-07 NOTE — Progress Notes (Signed)
 Annual Health Maintenance Exam   Patient Care Team: Meryem Haertel, Ronal PARAS, MD as PCP - General (Internal Medicine) Santo Stanly LABOR, MD as PCP - Cardiology (Cardiology) Prentiss Annabella LABOR, NP as Nurse Practitioner (Gynecology)  Visit Date: 11/07/23   Chief Complaint  Patient presents with   Medicare Wellness   Annual Exam   Subjective:  Patient: Megan Williams, Female DOB: 04/08/1945, 78 y.o. MRN: [id number] Vitals:   11/07/23 1121  BP: 110/80   [name] is a 78 y.o. Female who presents today for health maintenance exam and evaluation of medical issues. Patient has Hypothyroidism; Anxiety; Hyperlipidemia; History of smoking; Hypertension; Insomnia; Osteopenia; Migraines; COPD (chronic obstructive pulmonary disease) (HCC); Gait difficulty; Urinary urgency; Mild cognitive impairment; Degenerative joint disease of cervical spine; Lumbar stenosis with neurogenic claudication; Essential tremor; GERD (gastroesophageal reflux disease); History of pericarditis; Vertigo; Hearing loss; Aortic atherosclerosis (HCC); Degenerative lumbar spinal stenosis; and Cervical disc disorder with radiculopathy of cervical region on their problem list.   History of Hypothyroidism treated with Synthroid  88 mcg daily. TSH 11/01/2023 1.01 increased from 0.54 on 04/25/2023   History of Hypertension; Essential Tremor treated with Inderal  40 mg twice daily. HTN has been followed by Cardiologist, Dr. Stanly Santo. Blood Pressure: normotensive today at 110/80. [name]   History of Hyperlipidemia treated with 160 mg Fenofibrate  daily and 5 mg Rosuvastatin  daily. Lipid Panel WNL.Triglycerides 131, decreased from 131.   History of Osteopenia treated with Prolia  60 mg every six months. previously Managed by Orthopedist, Dr. Asberry Sinner. T-score at -1.7 in 3/23.    History of Anxiety treated with Valium  10 mg daily as needed, Desyrel  200 mg daily at bedtime, Effexor -XR 300 mg daily with breakfast.    History  of mild Memory Loss managed on 10 mg Namenda  BID, followed by Lauraine Born, NP.    History of dry eye disease: treated with Restatis one drop in both eyes three times as needed. Her last eye exam was at [facility] ophthalmology on 12/07/2022 and a follow up on 06/15/2023   S/p Posterior Decompression L1-L2 w/ Arthrodesis L1-L2 12/2022 for Degenerative Lumbar Spinal Stenosis; Lumbar Stenosis w/ Neurogenic Claudication. She reports that she is still experiencing pain in her back, and says that she is also having numbness in her left arm & hand. States that she follows-up with   Vaccine counseling: Due for Influenza vaccine    Health maintenace: Declined lung cancer screening    Labs 11/01/2023   MCHC 31.5   Creatinine 1.04   Alkaline Phosphatase 36    Otherwise labs WNL    Mammogram 12/02/2022 negative, next one scheduled for 12/01/23   Bone density 11/05/2021   ASSESSMENT: The BMD measured at Femur Neck Left is 0.805 g/cm2 with a T-score of -1.7. This patient is considered osteopenic/low bone mass according to World Health Organization [facility]) criteria.  Next one scheduled for 12/01/2023     Health Maintenance  Topic Date Due   Influenza Vaccine  09/30/2023   Lung Cancer Screening  11/06/2024 (Originally 10/28/2021)   Medicare Annual Wellness (AWV)  11/06/2024   DTaP/Tdap/Td (4 - Td or Tdap) 06/16/2032   Pneumococcal Vaccine: 50+ Years  Completed   DEXA SCAN  Completed   Hepatitis C Screening  Completed   Zoster Vaccines- Shingrix  Completed   HPV VACCINES  Aged Out   Meningococcal B Vaccine  Aged Out   Colonoscopy  Discontinued   COVID-19 Vaccine  Discontinued       Objective:  Vitals: body mass index is 21.35 kg/m. Today's Vitals   11/07/23 1121  BP: 110/80  Pulse: 63  SpO2: 98%  Weight: 113 lb (51.3 kg)  Height: 5' 1 (1.549 m)  PainSc: 5   PainLoc: Back   Physical Exam Vitals and nursing note reviewed.  Constitutional:      General: She is  not in acute distress.    Appearance: Normal appearance. She is not ill-appearing or toxic-appearing.  HENT:     Head: Normocephalic and atraumatic.     Right Ear: Hearing, tympanic membrane, ear canal and external ear normal.     Left Ear: Hearing, tympanic membrane, ear canal and external ear normal.     Mouth/Throat:     Pharynx: Oropharynx is clear.  Eyes:     Extraocular Movements: Extraocular movements intact.     Pupils: Pupils are equal, round, and reactive to light.  Neck:     Thyroid : No thyroid  mass, thyromegaly or thyroid  tenderness.     Vascular: No carotid bruit.  Cardiovascular:     Rate and Rhythm: Normal rate and regular rhythm. No extrasystoles are present.    Pulses:          Dorsalis pedis pulses are 2+ on the right side and 2+ on the left side.     Heart sounds: Normal heart sounds. No murmur heard.    No friction rub. No gallop.  Pulmonary:     Effort: Pulmonary effort is normal.     Breath sounds: Normal breath sounds. No decreased breath sounds, wheezing, rhonchi or rales.  Chest:     Chest wall: No mass.  Abdominal:     Palpations: Abdomen is soft. There is no hepatomegaly, splenomegaly or mass.     Tenderness: There is no abdominal tenderness.     Hernia: No hernia is present.  Musculoskeletal:     Cervical back: Normal range of motion.     Right lower leg: No edema.     Left lower leg: No edema.  Lymphadenopathy:     Cervical: No cervical adenopathy.     Upper Body:     Right upper body: No supraclavicular adenopathy.     Left upper body: No supraclavicular adenopathy.  Skin:    General: Skin is warm and dry.  Neurological:     General: No focal deficit present.     Mental Status: She is alert and oriented to person, place, and time. Mental status is at baseline.     Sensory: Sensation is intact.     Motor: Motor function is intact. No weakness.     Deep Tendon Reflexes: Reflexes are normal and symmetric.  Psychiatric:        Attention and  Perception: Attention normal.        Mood and Affect: Mood normal.        Speech: Speech normal.        Behavior: Behavior normal.        Thought Content: Thought content normal.        Cognition and Memory: Cognition normal.        Judgment: Judgment normal.     Current Outpatient Medications  Medication Instructions   acetaminophen  (TYLENOL ) 1,300 mg, 2 times daily   ARTIFICIAL TEAR SOLUTION OP 1 drop, 3 times daily PRN   aspirin EC 81 mg, Daily   cholecalciferol (VITAMIN D3) 1,000 Units, Daily   cyanocobalamin  (VITAMIN B12) 2,000 mcg, Daily   diazepam  (VALIUM ) 10 mg, 3 times daily  PRN   estradiol  (ESTRACE ) 1 mg, Oral, Daily   fenofibrate  160 MG tablet TAKE 1 TABLET BY MOUTH DAILY.   fexofenadine (ALLEGRA) 60 mg, Daily   fluticasone  (FLONASE ) 50 MCG/ACT nasal spray 1 spray, Daily   Gemtesa 75 mg, Daily   levothyroxine  (SYNTHROID ) 88 mcg, Oral, Daily   Melatonin 10 mg, Daily at bedtime   memantine  (NAMENDA ) 10 mg, Oral, 2 times daily, TAKE 10 mg TABLET BY MOUTH 2 TIMES DAILY.   methocarbamol  (ROBAXIN ) 500 mg, Oral, Every 6 hours PRN   Multiple Vitamins-Minerals (MULTIVITAMIN WITH MINERALS) tablet 1 tablet, Daily   Olopatadine  HCl (PATADAY  OP) 1 drop, 2 times daily PRN   oxyCODONE -acetaminophen  (PERCOCET) 5-325 MG tablet 1 tablet, Oral, Every 4 hours PRN   pantoprazole  (PROTONIX ) 40 MG tablet TAKE 1 TABLET BY MOUTH DAILY.   propranolol  (INDERAL ) 40 mg, Oral, 2 times daily   RESTASIS  0.05 % ophthalmic emulsion 1 drop, 2 times daily   rOPINIRole  (REQUIP ) 0.5 mg, Daily at bedtime   rosuvastatin  (CRESTOR ) 5 mg, Oral, Daily   traZODone  (DESYREL ) 200 mg, Daily at bedtime   valACYclovir  (VALTREX ) 500 MG tablet TAKE 1 TABLET BY MOUTH TWICE A DAY FOR 5 DAYS   venlafaxine  XR (EFFEXOR -XR) 300 mg, Daily with breakfast   Past Medical History:  Diagnosis Date   Allergy    Alzheimer disease (HCC) 2020   per pt; takes Namenda    Anxiety    Arthritis    Atrial fibrillation (HCC)     Complication of anesthesia    difficulty waking up. I could hear them but I couldn't wake up   COPD (chronic obstructive pulmonary disease) (HCC)    Depression    Emphysema    Esophagitis    GERD (gastroesophageal reflux disease)    Herpes simplex    History of hiatal hernia    Hypertension    Hypothyroidism    Migraines    Osteopenia    Vitamin D  deficiency    Medical/Surgical History Narrative:  Allergic/Intolerant to:  Allergies  Allergen Reactions   Doxycycline Hives, Itching, Nausea And Vomiting and Other (See Comments)    Skin peeling, Stevens-Johnson Syndrome    Latex Rash, Hives and Itching   Morphine  Itching and Other (See Comments)    Confusion, Dizziness   Penicillins Hives    All over body    Grass Extracts [Gramineae Pollens] Cough and Itching    Other reaction(s): Eye Redness   Short Ragweed Pollen Ext Cough and Itching    Other reaction(s): Eye Redness, Other   Celebrex [Celecoxib] Other (See Comments)    Blistering, pain, swelling   Cephalosporins Hives    All over body   Codeine Nausea And Vomiting   Gabapentin     Causes TICS     Past Surgical History:  Procedure Laterality Date   ANTERIOR CERVICAL DECOMP/DISCECTOMY FUSION N/A 08/29/2023   Procedure: ANTERIOR CERVICAL DECOMPRESSION/DISCECTOMY FUSION cervical three-four;  Surgeon: Colon Shove, MD;  Location: [facility];  Service: Neurosurgery;  Laterality: N/A;  ACDF C34,  REMOVE PLATE   BACK SURGERY  02/14/2018   lumbar 3-4  fusion   BREAST EXCISIONAL BIOPSY Left 1979   Benign    CATARACT EXTRACTION, BILATERAL     CERVICAL FUSION     CHOLECYSTECTOMY     KYPHOPLASTY  09/19/2015   Dr. Colon  T12   LUMBAR DISC SURGERY  11/11/2017   L3-4   Thumb surg     THYROIDECTOMY     TONSILLECTOMY  VAGINAL HYSTERECTOMY     Family History  Adopted: Yes  Family history unknown: Yes    Social History   Social History Narrative   Lives at home with husband.   Right-handed.   1 cup caffeine daily.    Most Recent Health Risks Assessment:   Medicare Risk at Home - 11/07/23 1116     Any stairs in or around the home? Yes    If so, are there any without handrails? Yes    Home free of loose throw rugs in walkways, pet beds, electrical cords, etc? Yes    Adequate lighting in your home to reduce risk of falls? Yes    Life alert? No    Use of a cane, walker or w/c? No    Grab bars in the bathroom? Yes    Shower chair or bench in shower? Yes    Elevated toilet seat or a handicapped toilet? Yes         Most Recent Social Determinants of Health (Including Hx of Tobacco, Alcohol , and Drug Use) SDOH Screenings   Food Insecurity: No Food Insecurity (11/07/2023)  Housing: Low Risk  (11/07/2023)  Transportation Needs: No Transportation Needs (11/07/2023)  Utilities: Not At Risk (11/07/2023)  Alcohol  Screen: Low Risk  (11/07/2023)  Depression (PHQ2-9): Low Risk  (11/07/2023)  Financial Resource Strain: Low Risk  (11/07/2023)  Recent Concern: Financial Resource Strain - Medium Risk (11/04/2023)  Physical Activity: Inactive (11/07/2023)  Social Connections: Socially Integrated (11/07/2023)  Stress: Stress Concern Present (11/07/2023)  Tobacco Use: High Risk (11/07/2023)  Health Literacy: Adequate Health Literacy (11/07/2023)   Social History   Tobacco Use   Smoking status: Every Day    Current packs/day: 0.50    Average packs/day: 0.5 packs/day for 57.7 years (28.8 ttl pk-yrs)    Types: Cigarettes    Start date: 1968   Smokeless tobacco: Never   Tobacco comments:    5  cigarettes daily  Vaping Use   Vaping status: Never Used  Substance Use Topics   Alcohol  use: Yes    Alcohol /week: 1.0 standard drink of alcohol     Types: 1 Glasses of wine per week    Comment: Rarely   Drug use: No   Most Recent Functional Status Assessment:    11/07/2023   11:16 AM  In your present state of health, do you have any difficulty performing the following activities:  Hearing? 0  Vision? 0  Difficulty concentrating or  making decisions? 0  Walking or climbing stairs? 0  Dressing or bathing? 0  Doing errands, shopping? 0  Preparing Food and eating ? N  Using the Toilet? N  In the past six months, have you accidently leaked urine? Y  Do you have problems with loss of bowel control? Y  Managing your Medications? N  Managing your Finances? N  Housekeeping or managing your Housekeeping? Y   Most Recent Fall Risk Assessment:    11/07/2023   11:17 AM  Fall Risk   Falls in the past year? 0  Number falls in past yr: 0  Injury with Fall? 0  Risk for fall due to : No Fall Risks  Follow up Education provided;Falls evaluation completed;Falls prevention discussed   Most Recent Anxiety/Depression Screenings:    11/07/2023   11:22 AM 10/20/2023   11:05 AM  PHQ 2/9 Scores  PHQ - 2 Score 0 1    Most Recent Cognitive Screening:    11/07/2023   11:23 AM  6CIT Screen  What  Year? 0 points  What month? 0 points  What time? 0 points  Count back from 20 0 points  Months in reverse 0 points  Repeat phrase 0 points  Total Score 0 points    Mammogram 12/02/2022 negative, next one scheduled for 12/01/23   Bone density 11/05/2021   ASSESSMENT: The BMD measured at Femur Neck Left is 0.805 g/cm2 with a T-score of -1.7. This patient is considered osteopenic/low bone mass according to World Health Organization [facility]) criteria.  Next one scheduled for 12/01/2023  Labs:  CBC w/ Differential Lab Results  Component Value Date   WBC 7.7 11/01/2023   RBC 4.39 11/01/2023   HGB 13.6 11/01/2023   HCT 43.2 11/01/2023   PLT 272 11/01/2023   MCV 98.4 11/01/2023   MCH 31.0 11/01/2023   MCHC 31.5 (L) 11/01/2023   RDW 13.3 11/01/2023   MPV 9.9 11/01/2023   LYMPHSABS 1,898 10/21/2022   MONOABS 438 10/07/2016   BASOSABS 39 11/01/2023    Comprehensive Metabolic Panel Lab Results  Component Value Date   NA 143 11/01/2023   K 4.3 11/01/2023   CL 107 11/01/2023   CO2 32 11/01/2023   GLUCOSE 99 11/01/2023   BUN 10  11/01/2023   CREATININE 1.04 (H) 11/01/2023   CALCIUM  10.1 11/01/2023   PROT 7.0 11/01/2023   ALBUMIN 4.0 10/07/2016   AST 20 11/01/2023   ALT 9 11/01/2023   ALKPHOS 49 10/07/2016   BILITOT 0.6 11/01/2023   EGFR 51 (L) 10/21/2022   GFRNONAA 58 (L) 08/19/2023   Lipid Panel  Lab Results  Component Value Date   CHOL 145 11/01/2023   HDL 56 11/01/2023   LDLCALC 68 11/01/2023   TRIG 131 11/01/2023   A1c Lab Results  Component Value Date   HGBA1C 4.9 10/07/2016    TSH Lab Results  Component Value Date   TSH 1.01 11/01/2023    Assessment & Plan:    Hypothyroidism: treated with Synthroid  88 mcg daily. TSH 11/01/2023 1.01 increased from 0.54 on 04/25/2023   Hypertension; Essential Tremor: treated with Inderal  40 mg twice daily. HTN has been followed by Cardiologist, Dr. Stanly Leavens. Blood Pressure: normotensive today at 110/80. [name]   Hyperlipidemia: treated with 160 mg Fenofibrate  daily and 5 mg Rosuvastatin  daily. Lipid Panel WNL.Triglycerides 131, decreased from 131.   Osteopenia: treated with Prolia  60 mg every six months. previously Managed by Orthopedist, Dr. Asberry Sinner. T-score at -1.7 in 3/23.    Anxiety: treated with Valium  10 mg daily as needed, Desyrel  200 mg daily at bedtime, Effexor -XR 300 mg daily with breakfast.    Mild Memory Loss: managed on 10 mg Namenda  BID, followed by Lauraine Born, NP.    History of Dry Eye Disease: treated with Restatis one drop in both eyes three times as needed. Her last eye exam was at [facility] ophthalmology on 12/07/2022 and a follow up on 06/15/2023.   S/p Posterior Decompression L1-L2 w/ Arthrodesis L1-L2 12/2022 for Degenerative Lumbar Spinal Stenosis; Lumbar Stenosis w/ Neurogenic Claudication. She reports that she is still experiencing pain in her back, and says that she is also having numbness in her left arm & hand. States that she follows-up with   Vaccine counseling: Due for Influenza vaccine     Health maintenace: Declined lung cancer screening    Labs 11/01/2023   MCHC 31.5   Creatinine 1.04   Alkaline Phosphatase 36    Otherwise labs WNL    Mammogram 12/02/2022 negative, next one  scheduled for 12/01/23   Bone density 11/05/2021   ASSESSMENT: The BMD measured at Femur Neck Left is 0.805 g/cm2 with a T-score of -1.7. This patient is considered osteopenic/low bone mass according to World Health Organization [facility]) criteria.  Next one scheduled for 12/01/2023    Annual Wellness Visit done today including the all of the following: Reviewed patient's Family Medical History Reviewed patient's SDOH and reviewed tobacco, alcohol , and drug use.  Reviewed and updated list of patient's medical providers Assessment of cognitive impairment was done Assessed patient's functional ability Established a written schedule for health screening services Health Risk Assessent Completed and Reviewed  Discussed health benefits of physical activity, and encouraged her to engage in regular exercise appropriate for her age and condition.    I,Makayla C Reid,acting as a scribe for Ronal JINNY Hailstone, MD.,have documented all relevant documentation on the behalf of Ronal JINNY Hailstone, MD,as directed by  Ronal JINNY Hailstone, MD while in the presence of Ronal JINNY Hailstone, MD.   I, Ronal JINNY Hailstone, MD, have reviewed all documentation for and agree with the above Annual Wellness Visit documentation.  Ronal JINNY Hailstone, MD Internal Medicine 11/07/2023

## 2023-11-14 ENCOUNTER — Other Ambulatory Visit (HOSPITAL_BASED_OUTPATIENT_CLINIC_OR_DEPARTMENT_OTHER): Payer: Self-pay | Admitting: Radiology

## 2023-11-14 ENCOUNTER — Ambulatory Visit (HOSPITAL_BASED_OUTPATIENT_CLINIC_OR_DEPARTMENT_OTHER): Payer: Self-pay | Admitting: Radiology

## 2023-11-14 DIAGNOSIS — M47816 Spondylosis without myelopathy or radiculopathy, lumbar region: Secondary | ICD-10-CM | POA: Diagnosis not present

## 2023-11-15 ENCOUNTER — Other Ambulatory Visit: Payer: Self-pay | Admitting: Internal Medicine

## 2023-11-15 ENCOUNTER — Other Ambulatory Visit: Payer: Self-pay

## 2023-11-15 ENCOUNTER — Other Ambulatory Visit (HOSPITAL_COMMUNITY): Payer: Self-pay

## 2023-11-15 MED ORDER — PROLIA 60 MG/ML ~~LOC~~ SOSY
60.0000 mg | PREFILLED_SYRINGE | SUBCUTANEOUS | 0 refills | Status: DC
  Filled 2023-11-15 – 2023-11-21 (×3): qty 1, 180d supply, fill #0

## 2023-11-16 DIAGNOSIS — M47816 Spondylosis without myelopathy or radiculopathy, lumbar region: Secondary | ICD-10-CM | POA: Diagnosis not present

## 2023-11-17 ENCOUNTER — Other Ambulatory Visit: Payer: Self-pay

## 2023-11-17 ENCOUNTER — Telehealth: Payer: Self-pay

## 2023-11-17 DIAGNOSIS — M8580 Other specified disorders of bone density and structure, unspecified site: Secondary | ICD-10-CM

## 2023-11-17 NOTE — Telephone Encounter (Signed)
Can you help with this? Thank you

## 2023-11-17 NOTE — Telephone Encounter (Signed)
 Patient's insurance plan no longer covers Prolia . They prefer Jubbonti.  Test claim goes through without a PA for $0.  Please send new prescription to [facility].

## 2023-11-21 ENCOUNTER — Other Ambulatory Visit (HOSPITAL_COMMUNITY): Payer: Self-pay

## 2023-11-21 ENCOUNTER — Other Ambulatory Visit: Payer: Self-pay

## 2023-11-21 DIAGNOSIS — M47816 Spondylosis without myelopathy or radiculopathy, lumbar region: Secondary | ICD-10-CM | POA: Diagnosis not present

## 2023-11-21 MED ORDER — JUBBONTI 60 MG/ML ~~LOC~~ SOSY
60.0000 mg | PREFILLED_SYRINGE | SUBCUTANEOUS | 0 refills | Status: AC
  Filled 2023-11-21: qty 60, 180d supply, fill #0
  Filled 2023-11-23: qty 1, 180d supply, fill #0

## 2023-11-21 NOTE — Telephone Encounter (Signed)
 Will you send a new prescription for Jubbonti  to [facility] pharmacy at your earliest convenience?  Thanks!

## 2023-11-22 ENCOUNTER — Other Ambulatory Visit (HOSPITAL_COMMUNITY): Payer: Self-pay

## 2023-11-22 ENCOUNTER — Encounter: Payer: Self-pay | Admitting: Internal Medicine

## 2023-11-22 ENCOUNTER — Other Ambulatory Visit: Payer: Self-pay

## 2023-11-23 ENCOUNTER — Other Ambulatory Visit: Payer: Self-pay

## 2023-11-23 ENCOUNTER — Other Ambulatory Visit (HOSPITAL_COMMUNITY): Payer: Self-pay

## 2023-11-23 DIAGNOSIS — M47816 Spondylosis without myelopathy or radiculopathy, lumbar region: Secondary | ICD-10-CM | POA: Diagnosis not present

## 2023-11-23 NOTE — Progress Notes (Signed)
 Specialty Pharmacy Initial Fill Coordination Note  Megan Williams is a 78 y.o. female contacted today regarding initial fill of specialty medication(s) Denosumab -bbdz (Jubbonti )   Patient requested Courier to Provider Office   Delivery date: 11/28/23   Verified address: [facility] [address]   Medication will be filled on 9/26.   Patient is aware of $0 copayment.

## 2023-11-23 NOTE — Progress Notes (Addendum)
 Pharmacy Patient Advocate Encounter  Insurance verification completed.   The patient is insured through [facility] [facility] ADVANTAGE/RX ADVANCE   Ran test claim for Jubbonti . Co-pay is $0.  This test claim was processed through [facility]- copay amounts may vary at other pharmacies due to pharmacy/plan contracts, or as the patient moves through the different stages of their insurance plan.

## 2023-11-23 NOTE — Progress Notes (Signed)
 New prescription for Jubbonti  on file. Copay is $0.

## 2023-11-25 ENCOUNTER — Other Ambulatory Visit: Payer: Self-pay

## 2023-11-28 DIAGNOSIS — M47816 Spondylosis without myelopathy or radiculopathy, lumbar region: Secondary | ICD-10-CM | POA: Diagnosis not present

## 2023-11-30 ENCOUNTER — Telehealth: Payer: Self-pay | Admitting: Internal Medicine

## 2023-11-30 DIAGNOSIS — M47816 Spondylosis without myelopathy or radiculopathy, lumbar region: Secondary | ICD-10-CM | POA: Diagnosis not present

## 2023-12-01 ENCOUNTER — Ambulatory Visit: Payer: Self-pay | Admitting: Internal Medicine

## 2023-12-01 ENCOUNTER — Encounter (HOSPITAL_BASED_OUTPATIENT_CLINIC_OR_DEPARTMENT_OTHER): Payer: Self-pay | Admitting: Radiology

## 2023-12-01 ENCOUNTER — Ambulatory Visit (INDEPENDENT_AMBULATORY_CARE_PROVIDER_SITE_OTHER)
Admission: RE | Admit: 2023-12-01 | Discharge: 2023-12-01 | Disposition: A | Discharge status: Home / Self Care | Source: Ambulatory Visit | Attending: Internal Medicine | Admitting: Internal Medicine

## 2023-12-01 ENCOUNTER — Telehealth: Payer: Self-pay | Admitting: Internal Medicine

## 2023-12-01 ENCOUNTER — Ambulatory Visit (INDEPENDENT_AMBULATORY_CARE_PROVIDER_SITE_OTHER)
Admission: RE | Admit: 2023-12-01 | Discharge: 2023-12-01 | Disposition: A | Discharge status: Home / Self Care | Payer: Self-pay | Source: Ambulatory Visit | Attending: Internal Medicine | Admitting: Internal Medicine

## 2023-12-01 DIAGNOSIS — M85852 Other specified disorders of bone density and structure, left thigh: Secondary | ICD-10-CM | POA: Diagnosis not present

## 2023-12-01 DIAGNOSIS — M85851 Other specified disorders of bone density and structure, right thigh: Secondary | ICD-10-CM | POA: Diagnosis not present

## 2023-12-01 DIAGNOSIS — Z1231 Encounter for screening mammogram for malignant neoplasm of breast: Secondary | ICD-10-CM | POA: Diagnosis not present

## 2023-12-01 DIAGNOSIS — M8580 Other specified disorders of bone density and structure, unspecified site: Secondary | ICD-10-CM

## 2023-12-01 DIAGNOSIS — Z780 Asymptomatic menopausal state: Secondary | ICD-10-CM | POA: Diagnosis not present

## 2023-12-01 NOTE — Progress Notes (Signed)
 Patient Care Team: Perri Ronal PARAS, MD as PCP - General (Internal Medicine) Santo Stanly LABOR, MD as PCP - Cardiology (Cardiology) Prentiss Annabella LABOR, NP as Nurse Practitioner (Gynecology)  Visit Date: 12/02/23  Subjective:    Patient ID: Megan Williams , Female   DOB: [birth year], 78 y.o.    MRN: [id number]   78 y.o. Female presents today for Pre-operative clearance . Patient has a past medical history of Patient has Hypothyroidism; Anxiety; Hyperlipidemia; History of smoking; Hypertension; Insomnia; Osteopenia; Migraines; COPD (chronic obstructive pulmonary disease) (HCC); Gait difficulty; Urinary urgency; Mild cognitive impairment; Degenerative joint disease of cervical spine; Lumbar stenosis with neurogenic claudication; Essential tremor; GERD (gastroesophageal reflux disease); History of pericarditis; Vertigo; Hearing loss; Aortic atherosclerosis (HCC); Degenerative lumbar spinal stenosis; and Cervical disc disorder with radiculopathy of cervical region on their problem list.  .   Last seen at [facility] on 10/26/2023. A previous MRI demonstrates that she has some lateral recess stenosis at  the L5-S1 level that affects both the passage of the L5 and the S1 Nerve root. Dr. Colon recommended a Laminectomy and a formanectomy on the left side at L5-S1 to decompress both paths of the surgical of the exiting L5 Nerve root above and the traversing S1 nerve root bellow.  She is here today for an EKG and preoperative clearance for a L5-S1 Laminectomy and Foraminotomy. She was last seen on 11/07/23 for her annual wellness exam.  Says the back pain has become unbearable and Dr. Colon plans to do surgery in the near future.  Hx of osteopenia. Receives Prolia  q 6 months. Last bone density study was yesterday and T score was -1.3. This is a good score for her. We are holding Prolia  for now due to upcoming surgery and because of pending formulary changes for treating  osteoporosis at [facility] and with [facility]. Dose had been due on 11/24/23.  History of Hypothyroidism treated with Synthroid  88 mcg daily. TSH 11/01/2023 1.01 increased from 0.54 on 04/25/2023   History of Hypertensionand  Essential Tremor treated with Inderal  40 mg twice daily. HTN has been followed by Cardiologist, Dr. Stanly Santo. Blood Pressure is normal today at 120/80.   History of Hyperlipidemia treated with 160 mg Fenofibrate  daily and 5 mg Rosuvastatin  daily. Lipid Panel WNL.Triglycerides 131, decreased from 131.    History of Osteopenia treated with Prolia  60 mg every six months. previously Managed by Orthopedist, Dr. Asberry Sinner. T-score at -1.7 in 12/01/2023.    History of Anxiety treated with Valium  10 mg daily as needed, Desyrel  200 mg daily at bedtime, Effexor -XR 300 mg daily with breakfast.    History of mild Memory Loss managed on 10 mg Namenda  BID, followed by Lauraine Born, NP at [facility].    History of dry eye disease: treated with Restatis one drop in both eyes three times as needed. Her last eye exam was at [facility] ophthalmology on 12/07/2022 and a follow up on 06/15/2023   S/p Posterior Decompression L1-L2 w/ Arthrodesis L1-L2 12/2022 for Degenerative Lumbar Spinal Stenosis; Lumbar Stenosis w/ Neurogenic Claudication. She reports that she is still experiencing pain in her back, and says that she is also having numbness in her left arm & hand. States that she follows-up with    Vaccine counseling: Due for Influenza vaccine    Health maintenace: Declined lung cancer screening     Labs 11/01/2023 MCHC 31.5, Creatinine 1.04, Alkaline Phosphatase 36, Otherwise labs WNL  12/02/2023 Mammogram pending   Bone density 12/02/2023   ASSESSMENT: The BMD measured at right total hip is 0.800 g/cm2 with a T-score of -1.7. This patient is considered osteopenic/low bone mass according to World Health Organization [facility]) criteria. Repeat in  2027  Defereing prolia  injection until 3 months after surgery.   Past Medical History:  Diagnosis Date   Allergy    Alzheimer disease (HCC) 2020   per pt; takes Namenda    Anxiety    Arthritis    Atrial fibrillation (HCC)    Complication of anesthesia    difficulty waking up. I could hear them but I couldn't wake up   COPD (chronic obstructive pulmonary disease) (HCC)    Depression    Emphysema    Esophagitis    GERD (gastroesophageal reflux disease)    Herpes simplex    History of hiatal hernia    Hypertension    Hypothyroidism    Migraines    Osteopenia    Vitamin D  deficiency      Family History  Adopted: Yes  Family history unknown: Yes    Social History   Social History Narrative   Lives at home with husband.   Right-handed.   1 cup caffeine daily.      Review of Systems  All other systems reviewed and are negative.       Objective:   Vitals: BP 120/80   Pulse 70   Ht 5' 1 (1.549 m)   Wt 111 lb (50.3 kg)   SpO2 98%   BMI 20.97 kg/m    Physical Exam Vitals and nursing note reviewed.  Constitutional:      General: She is not in acute distress.    Appearance: Normal appearance. She is not toxic-appearing.  HENT:     Head: Normocephalic and atraumatic.  Neck:     Thyroid : No thyroid  mass, thyromegaly or thyroid  tenderness.     Vascular: No carotid bruit.  Cardiovascular:     Rate and Rhythm: Normal rate and regular rhythm. No extrasystoles are present.    Pulses: Normal pulses.     Heart sounds: Normal heart sounds. No murmur heard.    No friction rub. No gallop.  Pulmonary:     Effort: Pulmonary effort is normal. No respiratory distress.     Breath sounds: Normal breath sounds. No wheezing or rales.  Lymphadenopathy:     Cervical: No cervical adenopathy.  Skin:    General: Skin is warm and dry.  Neurological:     Mental Status: She is alert and oriented to person, place, and time. Mental status is at baseline.  Psychiatric:         Mood and Affect: Mood normal.        Behavior: Behavior normal.        Thought Content: Thought content normal.        Judgment: Judgment normal.       Results:   12/02/2023 Mammogram pending   Bone density 12/02/2023   ASSESSMENT: The BMD measured at right total hip is 0.800 g/cm2 with a T-score of -1.7. This patient is considered osteopenic/low bone mass according to World Health Organization [facility]) criteria. Repeat in 2027  Labs:       Component Value Date/Time   NA 143 11/01/2023 1112   K 4.3 11/01/2023 1112   CL 107 11/01/2023 1112   CO2 32 11/01/2023 1112   GLUCOSE 99 11/01/2023 1112   BUN 10 11/01/2023 1112   CREATININE 1.04 (H)  11/01/2023 1112   CALCIUM  10.1 11/01/2023 1112   PROT 7.0 11/01/2023 1112   ALBUMIN 4.0 10/07/2016 0925   AST 20 11/01/2023 1112   ALT 9 11/01/2023 1112   ALKPHOS 49 10/07/2016 0925   BILITOT 0.6 11/01/2023 1112   GFRNONAA 58 (L) 08/19/2023 1400   GFRNONAA 52 (L) 05/20/2020 1619   GFRAA 60 05/20/2020 1619     Lab Results  Component Value Date   WBC 7.7 11/01/2023   HGB 13.6 11/01/2023   HCT 43.2 11/01/2023   MCV 98.4 11/01/2023   PLT 272 11/01/2023    Lab Results  Component Value Date   CHOL 145 11/01/2023   HDL 56 11/01/2023   LDLCALC 68 11/01/2023   TRIG 131 11/01/2023   CHOLHDL 2.6 11/01/2023    Lab Results  Component Value Date   HGBA1C 4.9 10/07/2016     Lab Results  Component Value Date   TSH 1.01 11/01/2023        Assessment & Plan:   Cleared for lumbar surgery with Dr. Colon. SHE IS NO LONGER ON eliquis . WAS D/Ced in SEPT 2022. Takes low dose ASA 81mg  daily. Recommend stopping Aspirin 7 days before surgery and resuming the day after surgery  Pre-Op exam: Last seen at [facility] on 10/26/2023. A previous MRI demonstrates that she has some lateral recess stenosis at  the L5-S1 level that affects both the passage of the L5 and the S1 Nerve root. Dr. Colon recommended a  Laminectomy and a formanectomy on the left side at L5-S1 to decompress both paths of the surgical of the exiting L5 Nerve root above and the traversing S1 nerve root bellow.  She is here today for an EKG and preoperative clearance for a L5-S1 Laminectomy and Foraminotomy. She was last seen on 11/07/23 for her annual wellness exam.    Hypothyroidism: treated with Synthroid  88 mcg daily. TSH 11/01/2023 1.01 increased from 0.54 on 04/25/2023   Hypertension; Essential Tremor: treated with Inderal  40 mg twice daily. HTN has been followed by Cardiologist, Dr. Stanly Leavens. Blood Pressure is normal today at 120/80.   Hyperlipidemia: treated with 160 mg Fenofibrate  daily and 5 mg Rosuvastatin  daily. Lipid Panel WNL.Triglycerides 131, decreased from 131.    Osteopenia: treated with Prolia  60 mg every six months. previously Managed by Orthopedist, Dr. Asberry Sinner. T-score at -1.7 in 12/02/2023.    Deferring Prolia  injection until 3 months after spine surgery.    Anxiety: treated with Valium  10 mg daily as needed, Desyrel  200 mg daily at bedtime, Effexor -XR 300 mg daily with breakfast.    Mild Memory Loss: managed on 10 mg Namenda  BID, followed by Lauraine Born, NP.     Dry eye disease: treated with Restatis one drop in both eyes three times as needed. Her last eye exam was at [facility] ophthalmology on 12/07/2022 and a follow up on 06/15/2023   S/p Posterior Decompression L1-L2 w/ Arthrodesis L1-L2 12/2022 for Degenerative Lumbar Spinal Stenosis; Lumbar Stenosis w/ Neurogenic Claudication. She reports that she is still experiencing pain in her back, and says that she is also having numbness in her left arm & hand. States that she follows-up with    Vaccine counseling: Due for Influenza vaccine    Health maintenace: Declined lung cancer screening     12/02/2023 Mammogram pending   Bone density 12/02/2023   ASSESSMENT: The BMD measured at right total hip is 0.800 g/cm2 with a  T-score of -1.7. This patient  is considered osteopenic/low bone mass according to [facility]) criteria. Repeat in 2027   I,Makayla C Reid,acting as a scribe for Ronal JINNY Hailstone, MD.,have documented all relevant documentation on the behalf of Ronal JINNY Hailstone, MD,as directed by  Ronal JINNY Hailstone, MD while in the presence of Ronal JINNY Hailstone, MD.

## 2023-12-02 ENCOUNTER — Ambulatory Visit (INDEPENDENT_AMBULATORY_CARE_PROVIDER_SITE_OTHER): Admitting: Internal Medicine

## 2023-12-02 ENCOUNTER — Encounter: Payer: Self-pay | Admitting: Internal Medicine

## 2023-12-02 VITALS — BP 120/80 | HR 70 | Ht 61.0 in | Wt 111.0 lb

## 2023-12-02 DIAGNOSIS — G250 Essential tremor: Secondary | ICD-10-CM | POA: Diagnosis not present

## 2023-12-02 DIAGNOSIS — M170 Bilateral primary osteoarthritis of knee: Secondary | ICD-10-CM

## 2023-12-02 DIAGNOSIS — Z981 Arthrodesis status: Secondary | ICD-10-CM | POA: Diagnosis not present

## 2023-12-02 DIAGNOSIS — H903 Sensorineural hearing loss, bilateral: Secondary | ICD-10-CM | POA: Diagnosis not present

## 2023-12-02 DIAGNOSIS — Z9889 Other specified postprocedural states: Secondary | ICD-10-CM | POA: Diagnosis not present

## 2023-12-02 DIAGNOSIS — Z01818 Encounter for other preprocedural examination: Secondary | ICD-10-CM | POA: Diagnosis not present

## 2023-12-02 DIAGNOSIS — M48062 Spinal stenosis, lumbar region with neurogenic claudication: Secondary | ICD-10-CM | POA: Diagnosis not present

## 2023-12-02 DIAGNOSIS — E039 Hypothyroidism, unspecified: Secondary | ICD-10-CM

## 2023-12-02 DIAGNOSIS — Z8679 Personal history of other diseases of the circulatory system: Secondary | ICD-10-CM

## 2023-12-02 DIAGNOSIS — F419 Anxiety disorder, unspecified: Secondary | ICD-10-CM | POA: Diagnosis not present

## 2023-12-02 DIAGNOSIS — M8580 Other specified disorders of bone density and structure, unspecified site: Secondary | ICD-10-CM | POA: Diagnosis not present

## 2023-12-02 DIAGNOSIS — I10 Essential (primary) hypertension: Secondary | ICD-10-CM | POA: Diagnosis not present

## 2023-12-02 DIAGNOSIS — G3184 Mild cognitive impairment, so stated: Secondary | ICD-10-CM | POA: Diagnosis not present

## 2023-12-02 DIAGNOSIS — F32A Depression, unspecified: Secondary | ICD-10-CM

## 2023-12-02 NOTE — Patient Instructions (Addendum)
 Approved for lumbar surgery with Dr. Colon. Hold giving Prolia  for 3 months due to upcoming surgery.

## 2023-12-04 ENCOUNTER — Encounter: Payer: Self-pay | Admitting: Internal Medicine

## 2023-12-05 DIAGNOSIS — M47816 Spondylosis without myelopathy or radiculopathy, lumbar region: Secondary | ICD-10-CM | POA: Diagnosis not present

## 2023-12-12 DIAGNOSIS — M47816 Spondylosis without myelopathy or radiculopathy, lumbar region: Secondary | ICD-10-CM | POA: Diagnosis not present

## 2023-12-20 ENCOUNTER — Other Ambulatory Visit: Payer: Self-pay | Admitting: *Deleted

## 2023-12-20 DIAGNOSIS — M7138 Other bursal cyst, other site: Secondary | ICD-10-CM | POA: Diagnosis not present

## 2023-12-20 DIAGNOSIS — M4807 Spinal stenosis, lumbosacral region: Secondary | ICD-10-CM | POA: Diagnosis not present

## 2023-12-20 DIAGNOSIS — M47816 Spondylosis without myelopathy or radiculopathy, lumbar region: Secondary | ICD-10-CM | POA: Diagnosis not present

## 2023-12-20 DIAGNOSIS — M8580 Other specified disorders of bone density and structure, unspecified site: Secondary | ICD-10-CM

## 2023-12-20 MED ORDER — DENOSUMAB-BBDZ 60 MG/ML ~~LOC~~ SOSY
60.0000 mg | PREFILLED_SYRINGE | Freq: Once | SUBCUTANEOUS | Status: AC
  Administered 2024-03-13: 60 mg via SUBCUTANEOUS

## 2023-12-22 ENCOUNTER — Other Ambulatory Visit (HOSPITAL_COMMUNITY): Payer: Self-pay

## 2024-01-03 ENCOUNTER — Other Ambulatory Visit: Payer: Self-pay | Admitting: Internal Medicine

## 2024-01-04 ENCOUNTER — Other Ambulatory Visit (HOSPITAL_BASED_OUTPATIENT_CLINIC_OR_DEPARTMENT_OTHER): Payer: Self-pay

## 2024-01-05 ENCOUNTER — Other Ambulatory Visit: Payer: Self-pay

## 2024-01-05 ENCOUNTER — Ambulatory Visit: Payer: Self-pay

## 2024-01-16 ENCOUNTER — Other Ambulatory Visit: Payer: Self-pay | Admitting: Internal Medicine

## 2024-01-17 DIAGNOSIS — N319 Neuromuscular dysfunction of bladder, unspecified: Secondary | ICD-10-CM | POA: Diagnosis not present

## 2024-01-17 DIAGNOSIS — N3281 Overactive bladder: Secondary | ICD-10-CM | POA: Diagnosis not present

## 2024-02-09 NOTE — Telephone Encounter (Signed)
 done

## 2024-02-10 ENCOUNTER — Ambulatory Visit: Admitting: Internal Medicine

## 2024-02-10 ENCOUNTER — Encounter: Payer: Self-pay | Admitting: Internal Medicine

## 2024-02-10 VITALS — BP 102/70 | HR 85 | Temp 99.2°F | Ht 61.0 in | Wt 111.0 lb

## 2024-02-10 DIAGNOSIS — J069 Acute upper respiratory infection, unspecified: Secondary | ICD-10-CM | POA: Diagnosis not present

## 2024-02-10 LAB — POC COVID19/FLU A&B COMBO
Covid Antigen, POC: NEGATIVE
Influenza A Antigen, POC: NEGATIVE
Influenza B Antigen, POC: NEGATIVE

## 2024-02-10 MED ORDER — AZITHROMYCIN 250 MG PO TABS: ORAL_TABLET | ORAL | 0 refills | Status: AC

## 2024-02-10 NOTE — Telephone Encounter (Signed)
 done

## 2024-02-10 NOTE — Progress Notes (Signed)
 Patient Care Team: Perri Ronal PARAS, MD as PCP - General (Internal Medicine) Santo Stanly LABOR, MD as PCP - Cardiology (Cardiology) Prentiss Annabella LABOR, NP as Nurse Practitioner (Gynecology)  Visit Date: 02/10/2024  Subjective:   Chief Complaint  Patient presents with   Cough    Coughing up green going on since Monday.    Patient PI:Mnapw S Sedlar,Female DOB:06-11-1945,78 y.o. FMW:[id number]   78 y.o.Female presents today for acute sick visit with cough. Patient has a past medical history of Allergy; Emphysema; Esophagitis. UTD on Infleunza 11/2023, PNA 2023; has not had RSV. She says that on Monday her symptoms onset with a sore throat and then on Tuesday developed congestion with discolored mucus which worsened over the week. Denies fever. She says that she used Delsym last night, which did relieve her cough, and saline nasal spray to prevent nasal irritation. She was last ill in February of 2024. Says that she recently took Prednisone  per Dr. Colon for her back pain.   Past Medical History:  Diagnosis Date   Allergy    Alzheimer disease (HCC) 2020   per pt; takes Namenda    Anxiety    Arthritis    Atrial fibrillation (HCC)    Complication of anesthesia    difficulty waking up. I could hear them but I couldn't wake up   COPD (chronic obstructive pulmonary disease) (HCC)    Depression    Emphysema    Esophagitis    GERD (gastroesophageal reflux disease)    Herpes simplex    History of hiatal hernia    Hypertension    Hypothyroidism    Migraines    Osteopenia    Vitamin D  deficiency     Allergies[1] Immunization History  Administered Date(s) Administered   Fluad Quad(high Dose 65+) 11/16/2021, 12/07/2023   INFLUENZA, HIGH DOSE SEASONAL PF 12/30/2017, 12/03/2022   Influenza,inj,Quad PF,6+ Mos 12/22/2012, 12/01/2015, 12/23/2016, 10/17/2018, 01/09/2020   Influenza-Unspecified 12/29/2017, 10/05/2019, 02/02/2021   PFIZER(Purple Top)SARS-COV-2 Vaccination 04/23/2019,  05/14/2019, 01/19/2020   PNEUMOCOCCAL CONJUGATE-20 10/26/2021   Pneumococcal Conjugate-13 06/25/2014   Pneumococcal Polysaccharide-23 08/07/2012   Tdap 04/23/2008, 07/18/2018, 06/17/2022   Zoster Recombinant(Shingrix) 09/08/2021, 11/16/2021   Zoster, Live 05/07/2010   Zoster, Unspecified 04/04/2020   Past Surgical History:  Procedure Laterality Date   ANTERIOR CERVICAL DECOMP/DISCECTOMY FUSION N/A 08/29/2023   Procedure: ANTERIOR CERVICAL DECOMPRESSION/DISCECTOMY FUSION cervical three-four;  Surgeon: Colon Shove, MD;  Location: [facility] OR;  Service: Neurosurgery;  Laterality: N/A;  ACDF C34,  REMOVE PLATE   BACK SURGERY  02/14/2018   lumbar 3-4  fusion   BREAST EXCISIONAL BIOPSY Left 1979   Benign    CATARACT EXTRACTION, BILATERAL     CERVICAL FUSION     CHOLECYSTECTOMY     KYPHOPLASTY  09/19/2015   Dr. Colon  T12   LUMBAR DISC SURGERY  11/11/2017   L3-4   Thumb surg     THYROIDECTOMY     TONSILLECTOMY     VAGINAL HYSTERECTOMY      Family History  Adopted: Yes  Family history unknown: Yes   Social History   Social History Narrative   Lives at home with husband.   Right-handed.   1 cup caffeine daily.   Review of Systems  Constitutional:  Negative for fever.  HENT:  Positive for congestion and sore throat.   Respiratory:  Positive for sputum production (discolored sputum/mucus).      Objective:  Vitals: BP 102/70   Pulse 85   Temp 99.2 F (37.3 C)  Ht 5' 1 (1.549 m)   Wt 111 lb (50.3 kg)   SpO2 96%   BMI 20.97 kg/m   Physical Exam Vitals and nursing note reviewed.  Constitutional:      General: She is not in acute distress.    Appearance: Normal appearance. She is not ill-appearing or toxic-appearing.     Comments: Sounds nasally congested, hoarse when speaking, and has a congested cough.  HENT:     Head: Normocephalic and atraumatic.     Right Ear: Tympanic membrane, ear canal and external ear normal.     Left Ear: Tympanic membrane, ear canal and  external ear normal.     Mouth/Throat:     Mouth: Mucous membranes are moist.     Pharynx: Oropharynx is clear. Posterior oropharyngeal erythema (slight) present. No oropharyngeal exudate.  Pulmonary:     Effort: Pulmonary effort is normal.     Breath sounds: Normal breath sounds. No wheezing, rhonchi or rales.  Musculoskeletal:     Cervical back: Neck supple.  Lymphadenopathy:     Cervical: No cervical adenopathy.  Skin:    General: Skin is warm and dry.  Neurological:     Mental Status: She is alert and oriented to person, place, and time. Mental status is at baseline.  Psychiatric:        Mood and Affect: Mood normal.        Behavior: Behavior normal.        Thought Content: Thought content normal.        Judgment: Judgment normal.     Results:  Studies Obtained And Personally Reviewed By Me: Labs:  CBC w/ Differential Lab Results  Component Value Date   WBC 7.7 11/01/2023   RBC 4.39 11/01/2023   HGB 13.6 11/01/2023   HCT 43.2 11/01/2023   PLT 272 11/01/2023   MCV 98.4 11/01/2023   MCH 31.0 11/01/2023   MCHC 31.5 (L) 11/01/2023   RDW 13.3 11/01/2023   MPV 9.9 11/01/2023   LYMPHSABS 1,898 10/21/2022   MONOABS 438 10/07/2016   BASOSABS 39 11/01/2023    Comprehensive Metabolic Panel Lab Results  Component Value Date   NA 143 11/01/2023   K 4.3 11/01/2023   CL 107 11/01/2023   CO2 32 11/01/2023   GLUCOSE 99 11/01/2023   BUN 10 11/01/2023   CREATININE 1.04 (H) 11/01/2023   CALCIUM  10.1 11/01/2023   PROT 7.0 11/01/2023   ALBUMIN 4.0 10/07/2016   AST 20 11/01/2023   ALT 9 11/01/2023   ALKPHOS 49 10/07/2016   BILITOT 0.6 11/01/2023   EGFR 51 (L) 10/21/2022   GFRNONAA 58 (L) 08/19/2023   Lipid Panel  Lab Results  Component Value Date   CHOL 145 11/01/2023   HDL 56 11/01/2023   LDLCALC 68 11/01/2023   TRIG 131 11/01/2023   A1c Lab Results  Component Value Date   HGBA1C 4.9 10/07/2016    TSH Lab Results  Component Value Date   TSH 1.01 11/01/2023    Results for orders placed or performed in visit on 02/10/24  POC Covid19/Flu A&B Antigen  Result Value Ref Range   Influenza A Antigen, POC Negative Negative   Influenza B Antigen, POC Negative Negative   Covid Antigen, POC Negative Negative   Assessment & Plan:   Orders Placed This Encounter  Procedures   POC Covid19/Flu A&B Antigen   Meds ordered this encounter  Medications   azithromycin  (ZITHROMAX ) 250 MG tablet    Sig: Take 2 tablets on day  1, then 1 tablet daily on days 2 through 5    Dispense:  6 tablet    Refill:  0   Acute Upper Respiratory Infection: history of Allergy; Emphysema; Esophagitis. UTD on Infleunza 11/2023, PNA 2023; has not had RSV. On Monday her symptoms onset with a sore throat and then on Tuesday she developed congestion with discolored mucus which worsened over the week. No fever. She used Delsym last night, which did relieve her cough, and saline nasal spray to prevent nasal irritation. She was last ill in February of 2024. She recently took Prednisone  per Dr. Colon for her back pain. Combined Influenza/Covid-19 was negative in office today.  Sending in Azithromycin  250 mg - take 2 tablets on Day 1 and 1 tablet on Days 2-5. Continue Delysm and nasal saline spray. Stay well rested, well hydrated, and well nourished. Walk around some to prevent atelectasis. Contact us  if symptoms worsen/persist despite treatment. Would recommend she receive her RSV.     I,Emily Lagle,acting as a neurosurgeon for Ronal JINNY Hailstone, MD.,have documented all relevant documentation on the behalf of Ronal JINNY Hailstone, MD,as directed by  Ronal JINNY Hailstone, MD while in the presence of Ronal JINNY Hailstone, MD.  I, Ronal JINNY Hailstone, MD, have reviewed all documentation for this visit. The documentation on 02/10/2024 for the exam, diagnosis, procedures, and orders are all accurate and complete.  IRonal JINNY Hailstone, MD, have reviewed all documentation for this visit. The documentation on 02/10/2024 for the exam,  diagnosis, procedures, and orders are all accurate and complete.      [1]  Allergies Allergen Reactions   Doxycycline Hives, Itching, Nausea And Vomiting and Other (See Comments)    Skin peeling, Stevens-Johnson Syndrome    Latex Rash, Hives and Itching   Morphine  Itching and Other (See Comments)    Confusion, Dizziness   Penicillins Hives    All over body    Grass Extracts [Gramineae Pollens] Cough and Itching    Other reaction(s): Eye Redness   Short Ragweed Pollen Ext Cough and Itching    Other reaction(s): Eye Redness, Other   Celebrex [Celecoxib] Other (See Comments)    Blistering, pain, swelling   Cephalosporins Hives    All over body   Codeine Nausea And Vomiting   Gabapentin     Causes TICS

## 2024-02-13 NOTE — Patient Instructions (Addendum)
 We are sorry you are not feeling well today.  We have sent in Zithromax  Z-PAK to take 2 tabs day 1 followed by 1 tab days 2 through 5.  Continue Delsym and nasal saline spray.  Stay well rested, well-hydrated and well-nourished.  Walk around some to prevent atelectasis of the lungs.  Call us  if symptoms worsen or persist despite treatment.  I would suggest once you are over this illness that you consider getting RSV vaccine.

## 2024-02-27 ENCOUNTER — Telehealth: Payer: Self-pay | Admitting: Internal Medicine

## 2024-02-27 NOTE — Telephone Encounter (Signed)
 Done

## 2024-03-06 ENCOUNTER — Encounter: Payer: Self-pay | Admitting: Internal Medicine

## 2024-03-06 ENCOUNTER — Ambulatory Visit: Admitting: Internal Medicine

## 2024-03-06 VITALS — Ht 61.0 in | Wt 111.0 lb

## 2024-03-06 DIAGNOSIS — G3184 Mild cognitive impairment, so stated: Secondary | ICD-10-CM | POA: Diagnosis not present

## 2024-03-06 DIAGNOSIS — I10 Essential (primary) hypertension: Secondary | ICD-10-CM

## 2024-03-06 DIAGNOSIS — J320 Chronic maxillary sinusitis: Secondary | ICD-10-CM

## 2024-03-06 MED ORDER — LEVOFLOXACIN 500 MG PO TABS: 500.0000 mg | ORAL_TABLET | Freq: Every day | ORAL | 0 refills | Status: AC

## 2024-03-06 MED ORDER — CEFTRIAXONE SODIUM 1 G IJ SOLR
1.0000 g | Freq: Once | INTRAMUSCULAR | Status: AC
  Administered 2024-03-06: 1 g via INTRAMUSCULAR

## 2024-03-06 NOTE — Progress Notes (Signed)
 "   Patient Care Team: Perri Ronal PARAS, MD as PCP - General (Internal Medicine) Santo Stanly LABOR, MD as PCP - Cardiology (Cardiology) Prentiss Annabella LABOR, NP as Nurse Practitioner (Gynecology)  Visit Date: 03/06/2024  Subjective:    Patient ID: Megan Williams , Female   DOB: [birth year], 79 y.o.    MRN: [id number]   79 y.o. Female presents today for Sinus congestion. Patient has a past medical history of Allergy; Emphysema; Esophagitis.  She was seen on December 12 for cough. Her symptoms started on Monday December 8 with a sore throat and then she developed a congestion and a cough. She was taking delsym and using saline irrigation. She was prescribed Azithromycin  250 mg. She said her symptoms never completely went away. She has a runny nose, productive cough and congestion. She denies having a fever. She does say that she does still smoke.    Past Medical History:  Diagnosis Date   Allergy    Alzheimer disease (HCC) 2020   per pt; takes Namenda    Anxiety    Arthritis    Atrial fibrillation (HCC)    Complication of anesthesia    difficulty waking up. I could hear them but I couldn't wake up   COPD (chronic obstructive pulmonary disease) (HCC)    Depression    Emphysema    Esophagitis    GERD (gastroesophageal reflux disease)    Herpes simplex    History of hiatal hernia    Hypertension    Hypothyroidism    Migraines    Osteopenia    Vitamin D  deficiency      Family History  Adopted: Yes  Family history unknown: Yes    Social History   Social History Narrative   Lives at home with husband.   Right-handed.   1 cup caffeine daily.      Review of Systems  HENT:  Positive for congestion.   Respiratory:  Positive for cough and sputum production (Green in color).         Objective:   Vitals: Ht 5' 1 (1.549 m)   Wt 111 lb (50.3 kg)   BMI 20.97 kg/m    Physical Exam Vitals and nursing note reviewed.  Constitutional:      General: She is not in  acute distress.    Appearance: Normal appearance. She is not ill-appearing.  HENT:     Head: Normocephalic and atraumatic.     Right Ear: Tympanic membrane, ear canal and external ear normal.     Left Ear: Tympanic membrane, ear canal and external ear normal.     Mouth/Throat:     Mouth: Mucous membranes are moist.     Pharynx: Oropharynx is clear. No oropharyngeal exudate or posterior oropharyngeal erythema.  Pulmonary:     Effort: Pulmonary effort is normal.     Breath sounds: Normal breath sounds. No wheezing, rhonchi or rales.  Lymphadenopathy:     Cervical: No cervical adenopathy.  Skin:    General: Skin is warm and dry.  Neurological:     Mental Status: She is alert and oriented to person, place, and time. Mental status is at baseline.  Psychiatric:        Mood and Affect: Mood normal.        Behavior: Behavior normal.        Thought Content: Thought content normal.        Judgment: Judgment normal.       Results:    Labs:  Component Value Date/Time   NA 143 11/01/2023 1112   K 4.3 11/01/2023 1112   CL 107 11/01/2023 1112   CO2 32 11/01/2023 1112   GLUCOSE 99 11/01/2023 1112   BUN 10 11/01/2023 1112   CREATININE 1.04 (H) 11/01/2023 1112   CALCIUM  10.1 11/01/2023 1112   PROT 7.0 11/01/2023 1112   ALBUMIN 4.0 10/07/2016 0925   AST 20 11/01/2023 1112   ALT 9 11/01/2023 1112   ALKPHOS 49 10/07/2016 0925   BILITOT 0.6 11/01/2023 1112   GFRNONAA 58 (L) 08/19/2023 1400   GFRNONAA 52 (L) 05/20/2020 1619   GFRAA 60 05/20/2020 1619     Lab Results  Component Value Date   WBC 7.7 11/01/2023   HGB 13.6 11/01/2023   HCT 43.2 11/01/2023   MCV 98.4 11/01/2023   PLT 272 11/01/2023    Lab Results  Component Value Date   CHOL 145 11/01/2023   HDL 56 11/01/2023   LDLCALC 68 11/01/2023   TRIG 131 11/01/2023   CHOLHDL 2.6 11/01/2023    Lab Results  Component Value Date   HGBA1C 4.9 10/07/2016     Lab Results  Component Value Date   TSH 1.01  11/01/2023        Assessment & Plan:   Meds ordered this encounter  Medications   levofloxacin  (LEVAQUIN ) 500 MG tablet    Sig: Take 1 tablet (500 mg total) by mouth daily for 7 days.    Dispense:  7 tablet    Refill:  0   No orders of the defined types were placed in this encounter.   Right Maxillary sinusitis:  She was seen on December 12 for cough. Her symptoms started on Monday December 8 with a sore throat and then she developed a congestion and a cough. She was taking delsym and using saline irrigation. She was prescribed Azithromycin  250 mg. She said her symptoms never completely went away. She has a runny nose, productive cough and congestion. She denies having a fever. She does say that she does still smoke.    Rocephin  1 g IM injection received today.   Levaquin  500 mg daily prescribed.   Mild cognitive impairment- stable  Essential HTN BP stable on current regimen  I,Makayla C Reid,acting as a scribe for Ronal JINNY Hailstone, MD.,have documented all relevant documentation on the behalf of Ronal JINNY Hailstone, MD,as directed by  Ronal JINNY Hailstone, MD while in the presence of Ronal JINNY Hailstone, MD.     "

## 2024-03-08 NOTE — Patient Instructions (Addendum)
 Patient given Rocephin  one gram Im in the office. Take Levaquin  500 mg daily x 7 days. Rest and stay well hydrated.

## 2024-03-13 ENCOUNTER — Ambulatory Visit

## 2024-03-13 DIAGNOSIS — M8580 Other specified disorders of bone density and structure, unspecified site: Secondary | ICD-10-CM

## 2024-03-13 NOTE — Progress Notes (Signed)
 Patient here for Jubbonti  injection for osteopenia.    Patient supplied: Yes  Injection given left lower abdomen and patient tolerated well.  Last bone density test was on 12/01/2023, peat in 2 years.    CMA gave Prolia  injection.

## 2024-03-14 ENCOUNTER — Other Ambulatory Visit: Payer: Self-pay | Admitting: Gastroenterology

## 2024-03-14 DIAGNOSIS — K529 Noninfective gastroenteritis and colitis, unspecified: Secondary | ICD-10-CM

## 2024-03-14 DIAGNOSIS — R109 Unspecified abdominal pain: Secondary | ICD-10-CM

## 2024-03-14 DIAGNOSIS — R634 Abnormal weight loss: Secondary | ICD-10-CM

## 2024-03-15 ENCOUNTER — Ambulatory Visit
Admission: RE | Admit: 2024-03-15 | Discharge: 2024-03-15 | Disposition: A | Source: Ambulatory Visit | Attending: Gastroenterology | Admitting: Gastroenterology

## 2024-03-15 DIAGNOSIS — R634 Abnormal weight loss: Secondary | ICD-10-CM

## 2024-03-15 DIAGNOSIS — R109 Unspecified abdominal pain: Secondary | ICD-10-CM

## 2024-03-15 DIAGNOSIS — K529 Noninfective gastroenteritis and colitis, unspecified: Secondary | ICD-10-CM

## 2024-03-15 MED ORDER — IOPAMIDOL (ISOVUE-300) INJECTION 61%
100.0000 mL | Freq: Once | INTRAVENOUS | Status: AC | PRN
  Administered 2024-03-15: 100 mL via INTRAVENOUS

## 2024-03-29 ENCOUNTER — Other Ambulatory Visit: Payer: Self-pay | Admitting: *Deleted

## 2024-03-29 DIAGNOSIS — M8580 Other specified disorders of bone density and structure, unspecified site: Secondary | ICD-10-CM

## 2024-03-29 MED ORDER — DENOSUMAB-BBDZ 60 MG/ML ~~LOC~~ SOSY: 60.0000 mg | PREFILLED_SYRINGE | Freq: Once | SUBCUTANEOUS | Status: AC

## 2024-06-14 ENCOUNTER — Ambulatory Visit: Admitting: Neurology

## 2024-10-22 ENCOUNTER — Ambulatory Visit: Admitting: Nurse Practitioner

## 2024-10-23 ENCOUNTER — Ambulatory Visit: Admitting: Nurse Practitioner

## 2024-11-08 ENCOUNTER — Other Ambulatory Visit: Payer: Self-pay

## 2024-11-12 ENCOUNTER — Ambulatory Visit: Payer: Self-pay | Admitting: Internal Medicine
